# Patient Record
Sex: Female | Born: 1950 | Race: Black or African American | Hispanic: No | Marital: Married | Smoking: Never smoker
Health system: Southern US, Community
[De-identification: ages and names within clinical notes are randomized; demographics above are authoritative.]

## PROBLEM LIST (undated history)

## (undated) DIAGNOSIS — K219 Gastro-esophageal reflux disease without esophagitis: Secondary | ICD-10-CM

## (undated) DIAGNOSIS — E785 Hyperlipidemia, unspecified: Secondary | ICD-10-CM

## (undated) DIAGNOSIS — Z87442 Personal history of urinary calculi: Secondary | ICD-10-CM

## (undated) DIAGNOSIS — C801 Malignant (primary) neoplasm, unspecified: Secondary | ICD-10-CM

## (undated) DIAGNOSIS — R600 Localized edema: Secondary | ICD-10-CM

## (undated) DIAGNOSIS — G629 Polyneuropathy, unspecified: Secondary | ICD-10-CM

## (undated) DIAGNOSIS — I10 Essential (primary) hypertension: Secondary | ICD-10-CM

## (undated) DIAGNOSIS — Z973 Presence of spectacles and contact lenses: Secondary | ICD-10-CM

## (undated) HISTORY — PX: DILATION AND CURETTAGE OF UTERUS: SHX78

## (undated) HISTORY — DX: Hyperlipidemia, unspecified: E78.5

## (undated) HISTORY — PX: PORT A CATH REVISION: SHX6033

## (undated) HISTORY — PX: COLONOSCOPY: SHX174

## (undated) HISTORY — PX: DENTAL SURGERY: SHX609

## (undated) HISTORY — PX: MASTECTOMY: SHX3

## (undated) HISTORY — PX: BACK SURGERY: SHX140

## (undated) HISTORY — DX: Essential (primary) hypertension: I10

---

## 1984-05-04 HISTORY — PX: ABDOMINAL HYSTERECTOMY: SHX81

## 2002-02-07 ENCOUNTER — Encounter: Admission: RE | Admit: 2002-02-07 | Discharge: 2002-02-07 | Payer: Self-pay | Admitting: Unknown Physician Specialty

## 2002-02-07 ENCOUNTER — Encounter: Payer: Self-pay | Admitting: Unknown Physician Specialty

## 2004-06-19 ENCOUNTER — Ambulatory Visit: Payer: Self-pay | Admitting: Internal Medicine

## 2005-07-20 ENCOUNTER — Ambulatory Visit (HOSPITAL_COMMUNITY): Admission: RE | Admit: 2005-07-20 | Discharge: 2005-07-20 | Payer: Self-pay | Admitting: Obstetrics and Gynecology

## 2005-07-23 ENCOUNTER — Ambulatory Visit (HOSPITAL_COMMUNITY): Admission: RE | Admit: 2005-07-23 | Discharge: 2005-07-23 | Payer: Self-pay | Admitting: Surgery

## 2005-07-30 ENCOUNTER — Encounter: Admission: RE | Admit: 2005-07-30 | Discharge: 2005-10-12 | Payer: Self-pay | Admitting: Surgery

## 2005-08-03 ENCOUNTER — Encounter: Admission: RE | Admit: 2005-08-03 | Discharge: 2005-08-03 | Payer: Self-pay | Admitting: Surgery

## 2005-08-14 ENCOUNTER — Ambulatory Visit (HOSPITAL_COMMUNITY): Admission: RE | Admit: 2005-08-14 | Discharge: 2005-08-14 | Payer: Self-pay | Admitting: Surgery

## 2005-10-19 ENCOUNTER — Ambulatory Visit (HOSPITAL_COMMUNITY): Admission: RE | Admit: 2005-10-19 | Discharge: 2005-10-21 | Payer: Self-pay | Admitting: Surgery

## 2005-10-19 HISTORY — PX: LAPAROSCOPIC GASTRIC BANDING: SHX1100

## 2005-10-26 ENCOUNTER — Encounter: Admission: RE | Admit: 2005-10-26 | Discharge: 2006-01-24 | Payer: Self-pay | Admitting: Surgery

## 2007-05-30 ENCOUNTER — Ambulatory Visit: Payer: Self-pay

## 2007-08-05 ENCOUNTER — Ambulatory Visit: Payer: Self-pay | Admitting: Gastroenterology

## 2008-08-09 ENCOUNTER — Ambulatory Visit: Payer: Self-pay | Admitting: Sports Medicine

## 2008-08-23 ENCOUNTER — Encounter: Payer: Self-pay | Admitting: Sports Medicine

## 2008-10-11 ENCOUNTER — Ambulatory Visit: Payer: Self-pay | Admitting: Internal Medicine

## 2009-11-12 ENCOUNTER — Ambulatory Visit: Payer: Self-pay | Admitting: Internal Medicine

## 2010-05-08 ENCOUNTER — Emergency Department: Payer: Self-pay | Admitting: Emergency Medicine

## 2010-12-15 ENCOUNTER — Encounter (INDEPENDENT_AMBULATORY_CARE_PROVIDER_SITE_OTHER): Payer: Self-pay | Admitting: Surgery

## 2010-12-17 ENCOUNTER — Ambulatory Visit (INDEPENDENT_AMBULATORY_CARE_PROVIDER_SITE_OTHER): Payer: PRIVATE HEALTH INSURANCE | Admitting: Surgery

## 2010-12-23 ENCOUNTER — Encounter (INDEPENDENT_AMBULATORY_CARE_PROVIDER_SITE_OTHER): Payer: Self-pay | Admitting: Surgery

## 2010-12-25 ENCOUNTER — Ambulatory Visit (INDEPENDENT_AMBULATORY_CARE_PROVIDER_SITE_OTHER): Payer: PRIVATE HEALTH INSURANCE | Admitting: Surgery

## 2011-01-16 ENCOUNTER — Encounter (INDEPENDENT_AMBULATORY_CARE_PROVIDER_SITE_OTHER): Payer: Self-pay | Admitting: Surgery

## 2011-03-11 ENCOUNTER — Other Ambulatory Visit: Payer: Self-pay

## 2011-04-14 ENCOUNTER — Ambulatory Visit: Payer: Self-pay

## 2012-04-20 ENCOUNTER — Ambulatory Visit: Payer: Self-pay

## 2012-09-28 ENCOUNTER — Ambulatory Visit: Payer: Self-pay

## 2013-02-03 ENCOUNTER — Ambulatory Visit (INDEPENDENT_AMBULATORY_CARE_PROVIDER_SITE_OTHER): Payer: Commercial Managed Care - PPO | Admitting: Surgery

## 2013-02-03 ENCOUNTER — Ambulatory Visit (INDEPENDENT_AMBULATORY_CARE_PROVIDER_SITE_OTHER): Payer: PRIVATE HEALTH INSURANCE | Admitting: Surgery

## 2013-02-03 VITALS — BP 128/78 | HR 77 | Resp 18 | Ht 63.0 in | Wt 186.8 lb

## 2013-02-03 DIAGNOSIS — Z9884 Bariatric surgery status: Secondary | ICD-10-CM

## 2013-02-03 NOTE — Progress Notes (Signed)
Re:   [name] DOB:   1950-07-09 MRN:   [id number]  ASSESSMENT AND PLAN: 1.  Lap band - 10 cm - 10/19/2005 - D. Heydy Montilla  Initial weight - 222, BMI 39.45  She has actually done very well with her weight loss and I think the lap band is functioning well.  I think her left upper quadrant pain is more consistent with costochondritis.  She'll see me back on her lap band anniversary, June 2015, to follow up the lap band and her weight loss.  2.  Hypertension 3.  Hyperlipidemia. 4.  Costochondritis, right costal margin  I discussed trying NSAID's for treatment.  And that is may be slow to resolve.  Chief Complaint  Patient presents with  . Lap Band Fill    Removal?   REFERRING PHYSICIAN: Lyndon Code, MD  HISTORY OF PRESENT ILLNESS: Regina Deleon is a 62 y.o. (DOB: 1951/02/26)  AA  female whose primary care physician is Lyndon Code, MD and comes to me today for lap band.  I last saw Ms. Kilbride 08/08/2008.  She has had some discomfort at her right costal margin that she attributes to the lap band and is interested in getting the lap band removed.  He is also concerned about the lap band decaying. She does get sick and vomits when she eats too fast or too much. So the Lap Band continues to provide resistance.  She has had the right subcostal pain for several months.  It hurts her when she gets up.  She has had no fever and no history of trauma. We talked about the lap band and its function.  I actually think that she would gain weight if the lap band were removed.   Past Medical History  Diagnosis Date  . Hypertension   . Hyperlipidemia     Current Outpatient Prescriptions  Medication Sig Dispense Refill  . aspirin 81 MG tablet Take 81 mg by mouth daily.      Marland Kitchen NIFEdipine (PROCARDIA-XL/ADALAT-CC/NIFEDICAL-XL) 30 MG 24 hr tablet Take 30 mg by mouth daily.      Marland Kitchen triamterene-hydrochlorothiazide (MAXZIDE-25) 37.5-25 MG per tablet Take 1 tablet by mouth daily.      . vitamin E 100  UNIT capsule Take 100 Units by mouth daily.      Marland Kitchen zolpidem (AMBIEN) 5 MG tablet Take 5 mg by mouth at bedtime as needed for sleep.       No current facility-administered medications for this visit.    Allergies  Allergen Reactions  . Darvocet [Propoxyphene-Acetaminophen]   . Enalapril   . Latex   . Penicillins     REVIEW OF SYSTEMS: Cardiac:  Hypertension.  Endocrine:  No diabetes. No thyroid disease. Hypercholesterolemia. Gastrointestinal:  Has had recent colonoscopy. Musculoskeletal:  History of back surgery.  SOCIAL and FAMILY HISTORY:  PHYSICAL EXAM: BP 128/78  Pulse 77  Resp 18  Ht 5\' 3"  (1.6 m)  Wt 186 lb 12.8 oz (84.732 kg)  BMI 33.1 kg/m2  General: WN AA F who is alert and generally healthy appearing.  HEENT: Normal. Pupils equal. Lungs: Clear to auscultation and symmetric breath sounds. Heart:  RRR. No murmur or rub. Abdomen: Soft. Tenderness at right costal margin.  This is a point tenderness.  The port is inferior and medial to her pain and is not tender.  I showed her a lap band model to show her the anatomy of the lap band.  I think the lap band  is working well for her.  She has normal bowel sounds and no peritoneal signs. Extremities:  Good strength and ROM  in upper and lower extremities. Neurologic:  Grossly intact to motor and sensory function. Psychiatric: Behavior is normal.   DATA REVIEWED: Epic  Ovidio Kin, MD,  [facility] [address]  [address]   [place], [place]    [address] Phone:  [phone] FAX:  [phone]

## 2013-10-16 ENCOUNTER — Ambulatory Visit: Payer: Self-pay

## 2013-12-12 ENCOUNTER — Other Ambulatory Visit: Payer: Self-pay

## 2013-12-12 ENCOUNTER — Other Ambulatory Visit: Payer: Self-pay | Admitting: Internal Medicine

## 2013-12-12 DIAGNOSIS — Z1231 Encounter for screening mammogram for malignant neoplasm of breast: Secondary | ICD-10-CM

## 2013-12-27 ENCOUNTER — Ambulatory Visit
Admission: RE | Admit: 2013-12-27 | Discharge: 2013-12-27 | Disposition: A | Discharge status: Home / Self Care | Payer: BC Managed Care – PPO | Source: Ambulatory Visit | Attending: Internal Medicine | Admitting: Internal Medicine

## 2013-12-27 DIAGNOSIS — Z1231 Encounter for screening mammogram for malignant neoplasm of breast: Secondary | ICD-10-CM

## 2013-12-29 ENCOUNTER — Other Ambulatory Visit: Payer: Self-pay | Admitting: Internal Medicine

## 2013-12-29 DIAGNOSIS — R928 Other abnormal and inconclusive findings on diagnostic imaging of breast: Secondary | ICD-10-CM

## 2014-01-09 ENCOUNTER — Other Ambulatory Visit: Payer: Self-pay | Admitting: Internal Medicine

## 2014-01-09 DIAGNOSIS — R928 Other abnormal and inconclusive findings on diagnostic imaging of breast: Secondary | ICD-10-CM

## 2014-01-10 ENCOUNTER — Ambulatory Visit
Admission: RE | Admit: 2014-01-10 | Discharge: 2014-01-10 | Disposition: A | Discharge status: Home / Self Care | Payer: BC Managed Care – PPO | Source: Ambulatory Visit | Attending: Internal Medicine | Admitting: Internal Medicine

## 2014-01-10 ENCOUNTER — Other Ambulatory Visit: Payer: Self-pay | Admitting: Internal Medicine

## 2014-01-10 DIAGNOSIS — R928 Other abnormal and inconclusive findings on diagnostic imaging of breast: Secondary | ICD-10-CM

## 2014-01-10 DIAGNOSIS — N63 Unspecified lump in unspecified breast: Secondary | ICD-10-CM

## 2014-01-12 ENCOUNTER — Other Ambulatory Visit (INDEPENDENT_AMBULATORY_CARE_PROVIDER_SITE_OTHER): Payer: Self-pay

## 2014-01-12 ENCOUNTER — Other Ambulatory Visit: Payer: Self-pay | Admitting: Internal Medicine

## 2014-01-12 DIAGNOSIS — D0592 Unspecified type of carcinoma in situ of left breast: Secondary | ICD-10-CM

## 2014-01-12 DIAGNOSIS — C50912 Malignant neoplasm of unspecified site of left female breast: Secondary | ICD-10-CM

## 2014-01-19 ENCOUNTER — Encounter: Payer: Self-pay | Admitting: Hematology and Oncology

## 2014-01-19 ENCOUNTER — Ambulatory Visit (HOSPITAL_BASED_OUTPATIENT_CLINIC_OR_DEPARTMENT_OTHER): Payer: BC Managed Care – PPO | Admitting: Hematology and Oncology

## 2014-01-19 ENCOUNTER — Ambulatory Visit (HOSPITAL_BASED_OUTPATIENT_CLINIC_OR_DEPARTMENT_OTHER): Payer: BC Managed Care – PPO

## 2014-01-19 ENCOUNTER — Ambulatory Visit
Admission: RE | Admit: 2014-01-19 | Discharge: 2014-01-19 | Disposition: A | Discharge status: Home / Self Care | Payer: BC Managed Care – PPO | Source: Ambulatory Visit | Attending: Internal Medicine | Admitting: Internal Medicine

## 2014-01-19 VITALS — BP 129/79 | HR 97 | Temp 97.0°F | Resp 18 | Ht 63.0 in | Wt 204.2 lb

## 2014-01-19 DIAGNOSIS — E119 Type 2 diabetes mellitus without complications: Secondary | ICD-10-CM

## 2014-01-19 DIAGNOSIS — C50212 Malignant neoplasm of upper-inner quadrant of left female breast: Secondary | ICD-10-CM | POA: Insufficient documentation

## 2014-01-19 DIAGNOSIS — C50912 Malignant neoplasm of unspecified site of left female breast: Secondary | ICD-10-CM

## 2014-01-19 DIAGNOSIS — C773 Secondary and unspecified malignant neoplasm of axilla and upper limb lymph nodes: Secondary | ICD-10-CM

## 2014-01-19 DIAGNOSIS — I10 Essential (primary) hypertension: Secondary | ICD-10-CM

## 2014-01-19 DIAGNOSIS — C50919 Malignant neoplasm of unspecified site of unspecified female breast: Secondary | ICD-10-CM

## 2014-01-19 HISTORY — DX: Malignant neoplasm of upper-inner quadrant of left female breast: C50.212

## 2014-01-19 MED ORDER — GADOBENATE DIMEGLUMINE 529 MG/ML IV SOLN
19.0000 mL | Freq: Once | INTRAVENOUS | Status: AC | PRN
  Administered 2014-01-19: 19 mL via INTRAVENOUS

## 2014-01-19 NOTE — Progress Notes (Signed)
[facility] NOTE  Patient Care Team: Lavera Guise, MD as PCP - General (Internal Medicine)  CHIEF COMPLAINTS/PURPOSE OF CONSULTATION:  Newly diagnosed breast cancer  HISTORY OF PRESENTING ILLNESS:  Regina Deleon 63 y.o. African American female is here because of recent diagnosis of left breast cancer. Patient underwent a mammogram on 08/07/2013 followed by ultrasound that revealed 1.8 cm mass in the left breast at 3:00 position in addition to that there were actually lymph nodes one was 2.8 cm and 1 was 1.2 cm in size. She underwent biopsy of the mass on the lymph node by ultrasound guidance. 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 Biopsied x-ray lymph nodes also showed invasive ductal carcinoma that was ER positive PR negative HER-2 positive. She had MRI of the breasts today and is going to be presented in the tumor board next Friday to come up with a treatment plan. She is here by herself and left her husband outside by choice. She denies any other symptoms or concerns. She also has a lap band placed in her surgically. She also had a hysterectomy and back surgeries. She has some numbness in the left leg I reviewed her records extensively and collaborated the history with the patient.  SUMMARY OF ONCOLOGIC HISTORY: Oncology History   And     Breast cancer of upper-inner quadrant of left female breast   12/27/2013 Mammogram Mammogram and ultrasound 1.8 cm mass left breast 3:00; 7 cm from nipple with abnormal 1 left axillary lymph node, 2.8 x 1.8 x 1.7 cm, second adjacent smaller nodule 1.2 x 1 x 0.7 cm   01/10/2014 Initial Diagnosis 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        1 AxLN biopsy: ER 70% PR 0% Ki-67 80% HER-2 +2.92, gene copy #5.5    In terms of breast cancer risk profile:  She menarched at early age of 19 and went to menopause at age 31  She had 2 pregnancy, her  first child was born at age 63  She has not received birth control pills.  She was exposed to fertility medications or hormone replacement therapy. She took Premarin for several years She has family history of Breast/GYN/GI cancer  MEDICAL HISTORY:  Past Medical History  Diagnosis Date  . Hypertension   . Hyperlipidemia   . Diabetes mellitus without complication     SURGICAL HISTORY: Past Surgical History  Procedure Laterality Date  . Laparoscopic gastric banding  10/19/05  . Abdominal hysterectomy  1986  . Back surgery  1990, 1991    SOCIAL HISTORY: History   Social History  . Marital Status: Married    Spouse Name: N/A    Number of Children: N/A  . Years of Education: N/A   Occupational History  . Not on file.   Social History Main Topics  . Smoking status: Never Smoker   . Smokeless tobacco: Never Used  . Alcohol Use: No  . Drug Use: No  . Sexual Activity: Yes   Other Topics Concern  . Not on file   Social History Narrative  . No narrative on file    FAMILY HISTORY: Family History  Problem Relation Age of Onset  . Cancer Maternal Aunt   . Cancer Cousin   . Cancer Cousin   . Cancer Cousin     ALLERGIES:  is allergic to darvocet; latex; enalapril; and  penicillins.  MEDICATIONS:  Current Outpatient Prescriptions  Medication Sig Dispense Refill  . aspirin 81 MG tablet Take 81 mg by mouth daily.      Marland Kitchen NIFEdipine (PROCARDIA-XL/ADALAT-CC/NIFEDICAL-XL) 30 MG 24 hr tablet Take 30 mg by mouth daily.      Marland Kitchen triamterene-hydrochlorothiazide (MAXZIDE-25) 37.5-25 MG per tablet Take 1 tablet by mouth daily.      Marland Kitchen zolpidem (AMBIEN) 5 MG tablet Take 5 mg by mouth at bedtime as needed for sleep.       No current facility-administered medications for this visit.    REVIEW OF SYSTEMS:   Constitutional: Denies fevers, chills or abnormal night sweats Eyes: Denies blurriness of vision, double vision or watery eyes Ears, nose, mouth, throat, and face: Denies  mucositis or sore throat Respiratory: Denies cough, dyspnea or wheezes Cardiovascular: Denies palpitation, chest discomfort or lower extremity swelling Gastrointestinal:  Denies nausea, heartburn or change in bowel habits Skin: Denies abnormal skin rashes Lymphatics: Denies new lymphadenopathy or easy bruising Neurological:Denies numbness, tingling or new weaknesses Behavioral/Psych: Mood is stable, no new changes  Breast: Denies any palpable lumps or discharge All other systems were reviewed with the patient and are negative.  PHYSICAL EXAMINATION: ECOG PERFORMANCE STATUS: 0 - Asymptomatic  Filed Vitals:   01/19/14 1352  BP: 129/79  Pulse: 97  Temp: 97 F (36.1 C)  Resp: 18   Filed Weights   01/19/14 1352  Weight: 204 lb 4 oz (92.647 kg)    GENERAL:alert, no distress and comfortable SKIN: skin color, texture, turgor are normal, no rashes or significant lesions EYES: normal, conjunctiva are pink and non-injected, sclera clear OROPHARYNX:no exudate, no erythema and lips, buccal mucosa, and tongue normal  NECK: supple, thyroid normal size, non-tender, without nodularity LYMPH:  no palpable lymphadenopathy in the cervical, axillary or inguinal LUNGS: clear to auscultation and percussion with normal breathing effort HEART: regular rate & rhythm and no murmurs and no lower extremity edema ABDOMEN:abdomen soft, non-tender and normal bowel sounds Musculoskeletal:no cyanosis of digits and no clubbing  PSYCH: alert & oriented x 3 with fluent speech NEURO: no focal motor/sensory deficits BREAST:No palpable nodules in breast. No palpable axillary or supraclavicular lymphadenopathy  RADIOGRAPHIC STUDIES: I have personally reviewed the radiological reports and agreed with the findings in the report. Mammogram ultrasounds or reports as above ASSESSMENT AND PLAN:  Breast cancer of upper-inner quadrant of left female breast 1. 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 Biopsy of the breast mass revealed IDC with DCIS ER/PR positive HER-2 positive and biopsied X3 lymph node showed ER positive PR negative HER-2 positive both had a Ki-67 of 80%, clinical stage IIA T1 CN1 M0  2. Discussed with the patient, the details of pathology including the type of breast cancer,the clinical staging, the significance of ER, PR and HER-2/neu receptors and the implications for treatment. After reviewing the pathology in detail, we proceeded to discuss the different treatment options between surgery, radiation, chemotherapy, antiestrogen therapies.  3. I discussed with her that she would benefit from systemic chemotherapy either given in a neoadjuvant fashion or in the adjuvant setting. Given the fact that she has node positive disease, I would recommend neoadjuvant therapy. We will present her case on this Wednesday his tumor board and make a final determination on the best approach to her treatment. Patient is willing to do whatever it takes to get the best possible outcomes.  4. I briefly discussed with her  the indications for radiation therapy. She understands that after chemotherapy she would need to go on Herceptin maintenance for a year. And after radiation therapy she would need antiestrogen therapy for 5-10 years.  5. If she were to need chemotherapy she will need port placement, echocardiogram, chemotherapy patient. I discussed the risks and benefits of chemotherapy including the risks of nausea/ vomiting, risk of infection from low WBC count, fatigue due to chemo or anemia, bruising or bleeding due to low platelets, mouth sores, loss/ change in taste and decreased appetite. Liver and kidney function will be monitored through out chemotherapy as abnormalities in liver and kidney function may be a side effect of treatment.  Cardiac dysfunction due to Adriamycin Herceptin and Perjeta were discussed in detail. Risk of permanent  bone marrow dysfunction and leukemia due to chemo were also discussed.  We will call the patient back on Wednesday after discussing in the tumor board regarding the final treatment plan.      All questions were answered. The patient knows to call the clinic with any problems, questions or concerns. I spent 55 minutes counseling the patient face to face. The total time spent in the appointment was 60 minutes and more than 50% was on counseling.     Rulon Eisenmenger, MD 01/19/2014 3:53 PM

## 2014-01-19 NOTE — Progress Notes (Signed)
Checked in new patient with no financial issues prior to seeing the dr. She has appt card and [facility] packet. She has my card and alight flyer and will call if asst is needed.

## 2014-01-19 NOTE — Assessment & Plan Note (Signed)
1. 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 Biopsy of the breast mass revealed IDC with DCIS ER/PR positive HER-2 positive and biopsied X3 lymph node showed ER positive PR negative HER-2 positive both had a Ki-67 of 80%, clinical stage IIA T1 CN1 M0  2. Discussed with the patient, the details of pathology including the type of breast cancer,the clinical staging, the significance of ER, PR and HER-2/neu receptors and the implications for treatment. After reviewing the pathology in detail, we proceeded to discuss the different treatment options between surgery, radiation, chemotherapy, antiestrogen therapies.  3. I discussed with her that she would benefit from systemic chemotherapy either given in a neoadjuvant fashion or in the adjuvant setting. Given the fact that she has node positive disease, I would recommend neoadjuvant therapy. We will present her case on this Wednesday his tumor board and make a final determination on the best approach to her treatment. Patient is willing to do whatever it takes to get the best possible outcomes.  4. I briefly discussed with her the indications for radiation therapy. She understands that after chemotherapy she would need to go on Herceptin maintenance for a year. And after radiation therapy she would need antiestrogen therapy for 5-10 years.  5. If she were to need chemotherapy she will need port placement, echocardiogram, chemotherapy patient. I discussed the risks and benefits of chemotherapy including the risks of nausea/ vomiting, risk of infection from low WBC count, fatigue due to chemo or anemia, bruising or bleeding due to low platelets, mouth sores, loss/ change in taste and decreased appetite. Liver and kidney function will be monitored through out chemotherapy as abnormalities in liver and kidney function may be a side effect of treatment.  Cardiac dysfunction due to Adriamycin  Herceptin and Perjeta were discussed in detail. Risk of permanent bone marrow dysfunction and leukemia due to chemo were also discussed.  We will call the patient back on Wednesday after discussing in the tumor board regarding the final treatment plan.

## 2014-01-22 ENCOUNTER — Other Ambulatory Visit (INDEPENDENT_AMBULATORY_CARE_PROVIDER_SITE_OTHER): Payer: Self-pay | Admitting: Surgery

## 2014-01-22 DIAGNOSIS — R928 Other abnormal and inconclusive findings on diagnostic imaging of breast: Secondary | ICD-10-CM

## 2014-01-23 NOTE — Progress Notes (Signed)
Note created by Dr. Gudena during office visit. Copy to patient, original to scan. 

## 2014-01-24 ENCOUNTER — Telehealth: Payer: Self-pay | Admitting: Hematology and Oncology

## 2014-01-24 ENCOUNTER — Encounter: Payer: Self-pay | Admitting: Hematology and Oncology

## 2014-01-24 ENCOUNTER — Other Ambulatory Visit (INDEPENDENT_AMBULATORY_CARE_PROVIDER_SITE_OTHER): Payer: Self-pay | Admitting: Surgery

## 2014-01-24 ENCOUNTER — Other Ambulatory Visit: Payer: Self-pay | Admitting: *Deleted

## 2014-01-24 DIAGNOSIS — C50212 Malignant neoplasm of upper-inner quadrant of left female breast: Secondary | ICD-10-CM

## 2014-01-24 NOTE — Progress Notes (Signed)
Spoke to pt concerning treatment care plan after discussion from breast tumor board. Informed pt about need for neoadjuvant chemotherapy and port placement. Discussed Dr. Pollie Friar office will call and schedule her for port. Scheduled and confirmed f/u appt with Dr. Lindi Adie on 01/31/14. Confirmed Dr. Pearlie Oyster appt as well. Pt very appreciative and willingness to comply with any recommendations. Informed research of possibility of study candidate.

## 2014-01-24 NOTE — Telephone Encounter (Signed)
, °

## 2014-01-24 NOTE — Progress Notes (Signed)
Called patient back and she will drop off her bank stmt for her and hubby to see if can get 1000.00 [employer] account executive. She is not sure if deductible has been met and she thnks she has 80/20 split. I advised can help with bills other than [facility], but she can't apply for asst with [facility] until she gets bal over 5000.00.

## 2014-01-25 ENCOUNTER — Ambulatory Visit
Admission: RE | Admit: 2014-01-25 | Discharge: 2014-01-25 | Disposition: A | Discharge status: Home / Self Care | Payer: BC Managed Care – PPO | Source: Ambulatory Visit | Attending: Surgery | Admitting: Surgery

## 2014-01-25 ENCOUNTER — Telehealth: Payer: Self-pay | Admitting: Hematology and Oncology

## 2014-01-25 DIAGNOSIS — R928 Other abnormal and inconclusive findings on diagnostic imaging of breast: Secondary | ICD-10-CM

## 2014-01-25 MED ORDER — GADOBENATE DIMEGLUMINE 529 MG/ML IV SOLN
19.0000 mL | Freq: Once | INTRAVENOUS | Status: AC | PRN
  Administered 2014-01-25: 19 mL via INTRAVENOUS

## 2014-01-25 NOTE — Telephone Encounter (Signed)
, °

## 2014-01-29 ENCOUNTER — Ambulatory Visit (HOSPITAL_COMMUNITY): Admission: RE | Admit: 2014-01-29 | Payer: BC Managed Care – PPO | Source: Ambulatory Visit

## 2014-01-29 ENCOUNTER — Encounter: Payer: Self-pay | Admitting: Hematology and Oncology

## 2014-01-29 ENCOUNTER — Other Ambulatory Visit: Payer: BC Managed Care – PPO

## 2014-01-29 ENCOUNTER — Other Ambulatory Visit: Payer: Self-pay

## 2014-01-29 ENCOUNTER — Encounter (HOSPITAL_COMMUNITY): Payer: Self-pay | Admitting: Pharmacy Technician

## 2014-01-29 DIAGNOSIS — C50212 Malignant neoplasm of upper-inner quadrant of left female breast: Secondary | ICD-10-CM

## 2014-01-29 NOTE — Progress Notes (Signed)
Pt notified d/t, NPO post 7, driver needed

## 2014-01-29 NOTE — Progress Notes (Signed)
No treatment date as of today.

## 2014-01-29 NOTE — Progress Notes (Signed)
Called and left patient mess to call me back. She left bank stmt and some bills. Will have to explain can pay if/when she starts treatment and will need entire bill.

## 2014-01-29 NOTE — Progress Notes (Signed)
Pt LM re: treatment start date.  Chart review -port not scheduled.  Scheduled pt port for Wed 9/30 at 130.  Called pt with d/t.  Pt reports she was not notified of d/t for echo.  Echo was scheduled by another RN.  Let pt know I would take care of.  Scheduled echo for Wed 9/30 at 1100.  Called pt back and let her know d/t, NPO after 7am, and driver needed.  Confirmed with patient Wednesday appts: 930 and 10 - radiation, 11 - echo, 1215 - Dr Lindi Adie, 130 port placement.  Apologized to patient that she was not notified of appt for echo.  Pt voiced understanding.

## 2014-01-30 ENCOUNTER — Other Ambulatory Visit: Payer: Self-pay | Admitting: Radiology

## 2014-01-30 ENCOUNTER — Telehealth: Payer: Self-pay | Admitting: Hematology and Oncology

## 2014-01-30 ENCOUNTER — Encounter: Payer: Self-pay | Admitting: *Deleted

## 2014-01-30 ENCOUNTER — Other Ambulatory Visit: Payer: BC Managed Care – PPO

## 2014-01-30 ENCOUNTER — Telehealth: Payer: Self-pay | Admitting: *Deleted

## 2014-01-30 NOTE — Telephone Encounter (Signed)
s.w. pt and advised on all appts....pt will pickup new sched tomorrow

## 2014-01-30 NOTE — Telephone Encounter (Signed)
Per staff message and POF I have scheduled appts. Advised scheduler of appts. JMW  

## 2014-01-30 NOTE — Progress Notes (Signed)
Location of Breast Cancer: Left Breast: Upper Inner  Histology per Pathology Report:   01/25/14 Diagnosis 1. Breast, left, needle core biopsy, posterior UOQ - INVASIVE DUCTAL CARCINOMA, SEE COMMENT. 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 2. Breast, left, needle core biopsy, anterior LOQ - INVASIVE DUCTAL CARCINOMA, SEE COMMENT  9/10/15Diagnosis 1. Breast, left, needle core biopsy, left axilla, 3:00 - INVASIVE DUCTAL CARCINOMA. - DUCTAL CARCINOMA IN SITU. - SEE COMMENT. 2. Lymph node, needle/core biopsy, left axillary 1 node - ONE LYMPH NODE POSITIVE FOR METASTATIC DUCTAL CARCINOMA   Receptor Status: ER(25%), PR(4%), Her2-neu(2.16 -UOQ), Ki-67(80%)                             ER(17%),  PR(0%), Her2-neu(1.35- LOQ), Ki-67(80%)  Regina Deleon underwent a mammogram on 08/07/2013 followed by ultrasound that revealed 1.8 cm mass in the left breast at 3:00 position in addition to that there were actually lymph nodes one was 2.8 cm and 1 was 1.2 cm in size. She underwent biopsy of the mass on the lymph node by ultrasound guidance. 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 Biopsied x-ray lymph nodes also showed invasive ductal carcinoma that was ER positive PR negative HER-2 positive.   Past/Anticipated interventions by surgeon, if any: Dr. Joneen Caraway : Biospy of left breast performed at the [facility].  Dr. Alphonsa Overall -Referral- 03/15/14 visit   Past/Anticipated interventions by medical oncology, if any: Chemotherapy 01/19/14 - Dr. Nicholas Lose - Chemotherapy  Lymphedema issues, if any:  None  Pain issues, if any: None  SAFETY ISSUES:  Prior radiation? NO  Pacemaker/ICD? NO  Possible current pregnancy? NO  Is the patient on methotrexate? NO  Current Complaints / other details:

## 2014-01-31 ENCOUNTER — Ambulatory Visit (HOSPITAL_BASED_OUTPATIENT_CLINIC_OR_DEPARTMENT_OTHER): Payer: BC Managed Care – PPO | Admitting: Hematology and Oncology

## 2014-01-31 ENCOUNTER — Ambulatory Visit (HOSPITAL_COMMUNITY)
Admission: RE | Admit: 2014-01-31 | Discharge: 2014-01-31 | Disposition: A | Discharge status: Home / Self Care | Payer: BC Managed Care – PPO | Source: Ambulatory Visit | Attending: Hematology and Oncology | Admitting: Hematology and Oncology

## 2014-01-31 ENCOUNTER — Ambulatory Visit
Admission: RE | Admit: 2014-01-31 | Discharge: 2014-01-31 | Disposition: A | Discharge status: Home / Self Care | Payer: BC Managed Care – PPO | Source: Ambulatory Visit | Attending: Radiation Oncology | Admitting: Radiation Oncology

## 2014-01-31 ENCOUNTER — Other Ambulatory Visit: Payer: Self-pay | Admitting: Radiology

## 2014-01-31 ENCOUNTER — Encounter (HOSPITAL_COMMUNITY): Payer: Self-pay

## 2014-01-31 ENCOUNTER — Encounter: Payer: Self-pay | Admitting: Radiation Oncology

## 2014-01-31 ENCOUNTER — Encounter: Payer: Self-pay | Admitting: Hematology and Oncology

## 2014-01-31 ENCOUNTER — Other Ambulatory Visit: Payer: Self-pay | Admitting: Hematology and Oncology

## 2014-01-31 VITALS — BP 117/62 | HR 73 | Temp 98.0°F | Ht 63.0 in | Wt 203.9 lb

## 2014-01-31 VITALS — BP 148/79 | HR 84 | Temp 98.3°F | Resp 18 | Ht 63.0 in | Wt 202.7 lb

## 2014-01-31 DIAGNOSIS — Z9884 Bariatric surgery status: Secondary | ICD-10-CM | POA: Diagnosis not present

## 2014-01-31 DIAGNOSIS — C50419 Malignant neoplasm of upper-outer quadrant of unspecified female breast: Secondary | ICD-10-CM | POA: Insufficient documentation

## 2014-01-31 DIAGNOSIS — C50212 Malignant neoplasm of upper-inner quadrant of left female breast: Secondary | ICD-10-CM

## 2014-01-31 DIAGNOSIS — C50919 Malignant neoplasm of unspecified site of unspecified female breast: Secondary | ICD-10-CM

## 2014-01-31 DIAGNOSIS — Z7982 Long term (current) use of aspirin: Secondary | ICD-10-CM | POA: Insufficient documentation

## 2014-01-31 DIAGNOSIS — E785 Hyperlipidemia, unspecified: Secondary | ICD-10-CM | POA: Diagnosis not present

## 2014-01-31 DIAGNOSIS — C50412 Malignant neoplasm of upper-outer quadrant of left female breast: Secondary | ICD-10-CM

## 2014-01-31 DIAGNOSIS — C969 Malignant neoplasm of lymphoid, hematopoietic and related tissue, unspecified: Secondary | ICD-10-CM | POA: Diagnosis not present

## 2014-01-31 DIAGNOSIS — Z79899 Other long term (current) drug therapy: Secondary | ICD-10-CM | POA: Insufficient documentation

## 2014-01-31 DIAGNOSIS — C50219 Malignant neoplasm of upper-inner quadrant of unspecified female breast: Secondary | ICD-10-CM | POA: Diagnosis present

## 2014-01-31 DIAGNOSIS — Z01818 Encounter for other preprocedural examination: Secondary | ICD-10-CM | POA: Insufficient documentation

## 2014-01-31 DIAGNOSIS — C773 Secondary and unspecified malignant neoplasm of axilla and upper limb lymph nodes: Secondary | ICD-10-CM

## 2014-01-31 DIAGNOSIS — Z510 Encounter for antineoplastic radiation therapy: Secondary | ICD-10-CM | POA: Insufficient documentation

## 2014-01-31 DIAGNOSIS — Z90710 Acquired absence of both cervix and uterus: Secondary | ICD-10-CM | POA: Diagnosis not present

## 2014-01-31 DIAGNOSIS — Z452 Encounter for adjustment and management of vascular access device: Secondary | ICD-10-CM | POA: Diagnosis present

## 2014-01-31 DIAGNOSIS — E119 Type 2 diabetes mellitus without complications: Secondary | ICD-10-CM | POA: Insufficient documentation

## 2014-01-31 DIAGNOSIS — I10 Essential (primary) hypertension: Secondary | ICD-10-CM | POA: Insufficient documentation

## 2014-01-31 DIAGNOSIS — Z170 Estrogen receptor positive status [ER+]: Secondary | ICD-10-CM

## 2014-01-31 LAB — GLUCOSE, CAPILLARY: GLUCOSE-CAPILLARY: 93 mg/dL (ref 70–99)

## 2014-01-31 LAB — CBC WITH DIFFERENTIAL/PLATELET
BASOS ABS: 0.1 10*3/uL (ref 0.0–0.1)
Basophils Relative: 1 % (ref 0–1)
Eosinophils Absolute: 0.1 10*3/uL (ref 0.0–0.7)
Eosinophils Relative: 1 % (ref 0–5)
HCT: 39 % (ref 36.0–46.0)
HEMOGLOBIN: 12.4 g/dL (ref 12.0–15.0)
Lymphocytes Relative: 31 % (ref 12–46)
Lymphs Abs: 2.5 10*3/uL (ref 0.7–4.0)
MCH: 27.6 pg (ref 26.0–34.0)
MCHC: 31.8 g/dL (ref 30.0–36.0)
MCV: 86.9 fL (ref 78.0–100.0)
MONOS PCT: 5 % (ref 3–12)
Monocytes Absolute: 0.4 10*3/uL (ref 0.1–1.0)
NEUTROS ABS: 5.2 10*3/uL (ref 1.7–7.7)
NEUTROS PCT: 62 % (ref 43–77)
Platelets: 301 10*3/uL (ref 150–400)
RBC: 4.49 MIL/uL (ref 3.87–5.11)
RDW: 13.7 % (ref 11.5–15.5)
WBC: 8.3 10*3/uL (ref 4.0–10.5)

## 2014-01-31 LAB — PROTIME-INR
INR: 1.03 (ref 0.00–1.49)
Prothrombin Time: 13.5 seconds (ref 11.6–15.2)

## 2014-01-31 LAB — APTT: APTT: 25 s (ref 24–37)

## 2014-01-31 MED ORDER — MIDAZOLAM HCL 2 MG/2ML IJ SOLN
INTRAMUSCULAR | Status: AC
  Filled 2014-01-31: qty 6

## 2014-01-31 MED ORDER — LORAZEPAM 0.5 MG PO TABS: 0.5000 mg | ORAL_TABLET | Freq: Four times a day (QID) | ORAL | Status: DC | PRN

## 2014-01-31 MED ORDER — SODIUM CHLORIDE 0.9 % IV SOLN
INTRAVENOUS | Status: DC
  Administered 2014-01-31: 15:00:00 via INTRAVENOUS

## 2014-01-31 MED ORDER — HEPARIN SOD (PORK) LOCK FLUSH 100 UNIT/ML IV SOLN
INTRAVENOUS | Status: AC
  Filled 2014-01-31: qty 5

## 2014-01-31 MED ORDER — VANCOMYCIN HCL IN DEXTROSE 1-5 GM/200ML-% IV SOLN
1000.0000 mg | INTRAVENOUS | Status: AC
  Administered 2014-01-31: 1000 mg via INTRAVENOUS
  Filled 2014-01-31: qty 200

## 2014-01-31 MED ORDER — FENTANYL CITRATE 0.05 MG/ML IJ SOLN
INTRAMUSCULAR | Status: AC
  Filled 2014-01-31: qty 6

## 2014-01-31 MED ORDER — LIDOCAINE-PRILOCAINE 2.5-2.5 % EX CREA: 1.0000 "application " | TOPICAL_CREAM | CUTANEOUS | Status: DC | PRN

## 2014-01-31 MED ORDER — LIDOCAINE-EPINEPHRINE (PF) 2 %-1:200000 IJ SOLN
INTRAMUSCULAR | Status: AC
  Filled 2014-01-31: qty 20

## 2014-01-31 MED ORDER — DEXAMETHASONE 4 MG PO TABS: 8.0000 mg | ORAL_TABLET | Freq: Two times a day (BID) | ORAL | Status: DC

## 2014-01-31 MED ORDER — ONDANSETRON HCL 8 MG PO TABS: 8.0000 mg | ORAL_TABLET | Freq: Two times a day (BID) | ORAL | Status: DC

## 2014-01-31 MED ORDER — FENTANYL CITRATE 0.05 MG/ML IJ SOLN
INTRAMUSCULAR | Status: AC | PRN
  Administered 2014-01-31: 25 ug via INTRAVENOUS
  Administered 2014-01-31: 50 ug via INTRAVENOUS
  Administered 2014-01-31: 25 ug via INTRAVENOUS

## 2014-01-31 MED ORDER — PROCHLORPERAZINE MALEATE 10 MG PO TABS
10.0000 mg | ORAL_TABLET | Freq: Four times a day (QID) | ORAL | Status: DC | PRN
Start: 2014-01-31 — End: 2014-07-04

## 2014-01-31 MED ORDER — MIDAZOLAM HCL 2 MG/2ML IJ SOLN
INTRAMUSCULAR | Status: AC | PRN
  Administered 2014-01-31 (×3): 1 mg via INTRAVENOUS
  Administered 2014-01-31 (×2): 0.5 mg via INTRAVENOUS

## 2014-01-31 NOTE — Discharge Instructions (Signed)
Conscious Sedation, Adult, Care After Refer to this sheet in the next few weeks. These instructions provide you with information on caring for yourself after your procedure. Your health care provider may also give you more specific instructions. Your treatment has been planned according to current medical practices, but problems sometimes occur. Call your health care provider if you have any problems or questions after your procedure. WHAT TO EXPECT AFTER THE PROCEDURE  After your procedure:  You may feel sleepy, clumsy, and have poor balance for several hours.  Vomiting may occur if you eat too soon after the procedure. HOME CARE INSTRUCTIONS  Do not participate in any activities where you could become injured for at least 24 hours. Do not:  Drive.  Swim.  Ride a bicycle.  Operate heavy machinery.  Cook.  Use power tools.  Climb ladders.  Work from a high place.  Do not make important decisions or sign legal documents until you are improved.  If you vomit, drink water, juice, or soup when you can drink without vomiting. Make sure you have little or no nausea before eating solid foods.  Only take over-the-counter or prescription medicines for pain, discomfort, or fever as directed by your health care provider.  Make sure you and your family fully understand everything about the medicines given to you, including what side effects may occur.  You should not drink alcohol, take sleeping pills, or take medicines that cause drowsiness for at least 24 hours.  If you smoke, do not smoke without supervision.  If you are feeling better, you may resume normal activities 24 hours after you were sedated.  Keep all appointments with your health care provider. SEEK MEDICAL CARE IF:  Your skin is pale or bluish in color.  You continue to feel nauseous or vomit.  Your pain is getting worse and is not helped by medicine.  You have bleeding or swelling.  You are still sleepy or  feeling clumsy after 24 hours. SEEK IMMEDIATE MEDICAL CARE IF:  You develop a rash.  You have difficulty breathing.  You develop any type of allergic problem.  You have a fever. MAKE SURE YOU:  Understand these instructions.  Will watch your condition.  Will get help right away if you are not doing well or get worse. Document Released: 02/08/2013 Document Reviewed: 02/08/2013 [facility] Patient Information 2015 [place], [place]. This information is not intended to replace advice given to you by your health care provider. Make sure you discuss any questions you have with your health care provider.  Implanted Port Insertion, Care After Refer to this sheet in the next few weeks. These instructions provide you with information on caring for yourself after your procedure. Your health care provider may also give you more specific instructions. Your treatment has been planned according to current medical practices, but problems sometimes occur. Call your health care provider if you have any problems or questions after your procedure. WHAT TO EXPECT AFTER THE PROCEDURE After your procedure, it is typical to have the following:   Discomfort at the port insertion site. Ice packs to the area will help.  Bruising on the skin over the port. This will subside in 3-4 days. HOME CARE INSTRUCTIONS  After your port is placed, you will get a manufacturer's information card. The card has information about your port. Keep this card with you at all times.   Know what kind of port you have. There are many types of ports available.   Wear a medical  alert bracelet in case of an emergency. This can help alert health care workers that you have a port.   The port can stay in for as long as your health care provider believes it is necessary.   A home health care nurse may give medicines and take care of the port.   You or a family member can get special training and directions for giving medicine and  taking care of the port at home.  SEEK MEDICAL CARE IF:   Your port does not flush or you are unable to get a blood return.   You have a fever or chills. SEEK IMMEDIATE MEDICAL CARE IF:  You have new fluid or pus coming from your incision.   You notice a bad smell coming from your incision site.   You have swelling, pain, or more redness at the incision or port site.   You have chest pain or shortness of breath. Document Released: 02/08/2013 Document Revised: 04/25/2013 Document Reviewed: 02/08/2013 [facility] Patient Information 2015 [place], [place]. This information is not intended to replace advice given to you by your health care provider. Make sure you discuss any questions you have with your health care provider.  May shower 24 - 48 hours post procedure. Remove dressing prior to shower.   Implanted [facility], The Guide An implanted port is a type of central line that is placed under the skin. Central lines are used to provide IV access when treatment or nutrition needs to be given through a person's veins. Implanted ports are used for long-term IV access. An implanted port may be placed because:  You need IV medicine that would be irritating to the small veins in your hands or arms.  You need long-term IV medicines, such as antibiotics.  You need IV nutrition for a long period.  You need frequent blood draws for lab tests.  You need dialysis.  Implanted ports are usually placed in the chest area, but they can also be placed in the upper arm, the abdomen, or the leg. An implanted port has two main parts:  Reservoir. The reservoir is round and will appear as a small, raised area under your skin. The reservoir is the part where a needle is inserted to give medicines or draw blood.  Catheter. The catheter is a thin, flexible tube that extends from the reservoir. The catheter is placed into a large vein. Medicine that is inserted into the reservoir goes into the catheter and then  into the vein.  HOW WILL I CARE FOR MY INCISION SITE? Do not get the incision site wet. Bathe or shower as directed by your health care provider.  HOW IS MY PORT ACCESSED? Special steps must be taken to access the port:  Before the port is accessed, a numbing cream can be placed on the skin. This helps numb the skin over the port site.  Your health care provider uses a sterile technique to access the port. Your health care provider must put on a mask and sterile gloves. The skin over your port is cleaned carefully with an antiseptic and allowed to dry. The port is gently pinched between sterile gloves, and a needle is inserted into the port. Only "non-coring" port needles should be used to access the port. Once the port is accessed, a blood return should be checked. This helps ensure that the port is in the vein and is not clogged.  If your port needs to remain accessed for a constant infusion, a clear (transparent)  bandage will be placed over the needle site. The bandage and needle will need to be changed every week, or as directed by your health care provider.  Keep the bandage covering the needle clean and dry. Do not get it wet. Follow your health care provider's instructions on how to take a shower or bath while the port is accessed.  If your port does not need to stay accessed, no bandage is needed over the port.  WHAT IS FLUSHING? Flushing helps keep the port from getting clogged. Follow your health care provider's instructions on how and when to flush the port. Ports are usually flushed with saline solution or a medicine called heparin. The need for flushing will depend on how the port is used.  If the port is used for intermittent medicines or blood draws, the port will need to be flushed:  After medicines have been given.  After blood has been drawn.  As part of routine maintenance.  If a constant infusion is running, the port may not need to be flushed.  HOW LONG WILL MY  PORT STAY IMPLANTED? The port can stay in for as long as your health care provider thinks it is needed. When it is time for the port to come out, surgery will be done to remove it. The procedure is similar to the one performed when the port was put in.  WHEN SHOULD I SEEK IMMEDIATE MEDICAL CARE? When you have an implanted port, you should seek immediate medical care if:  You notice a bad smell coming from the incision site.  You have swelling, redness, or drainage at the incision site.  You have more swelling or pain at the port site or the surrounding area.  You have a fever that is not controlled with medicine. Document Released: 04/20/2005 Document Revised: 02/08/2013 Document Reviewed: 12/26/2012 [facility] Patient Information 2015 [place], [place]. This information is not intended to replace advice given to you by your health care provider. Make sure you discuss any questions you have with your health care provider.

## 2014-01-31 NOTE — Procedures (Signed)
Successful placement of right IJ approach port-a-cath with tip at the superior caval atrial junction. The catheter is ready for immediate use. No immediate post procedural complications. 

## 2014-01-31 NOTE — Addendum Note (Signed)
Encounter addended by: Deirdre Evener, RN on: 01/31/2014  5:31 PM<BR>     Documentation filed: Charges VN

## 2014-01-31 NOTE — Progress Notes (Signed)
Chemo appt changed and injectino appt added.  New appt calendar printed and mailed to patient.

## 2014-01-31 NOTE — Progress Notes (Signed)
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   Primary site: Breast (Left)   Staging method: AJCC 7th Edition   Clinical: Stage IIB (T2, N1, cM0) signed by Eppie Gibson, MD on 01/31/2014 11:11 AM   Pathologic: (cM0)   Summary: Stage IIB (T2, N1, cM0)   SUMMARY OF ONCOLOGIC HISTORY: Oncology History   And     Breast cancer of upper-inner quadrant of left female breast   12/27/2013 Mammogram Mammogram and ultrasound 1.8 cm mass left breast 3:00; 7 cm from nipple with abnormal 1 left axillary lymph node, 2.8 x 1.8 x 1.7 cm, second adjacent smaller nodule 1.2 x 1 x 0.7 cm   01/10/2014 Initial Diagnosis 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        1 AxLN biopsy: ER 70% PR 0% Ki-67 80% HER-2 +2.92, gene copy #5.5    CHIEF COMPLIANT: Patient is here to sign consent for neoadjuvant chemotherapy with TC HP  INTERVAL HISTORY: Regina Deleon is a 63 year old African American lady with above-mentioned history of HER-2 positive breast cancer that was involving the lymph node in axilla. She is here today to discuss the neoadjuvant chemotherapy treatment. Last visit, I discussed with her that chemotherapy given in the adjuvant setting on neoadjuvant setting I discussed that chemotherapy with Taxotere carboplatin Herceptin and Perjeta. She underwent chemotherapy education class along with an echocardiogram of the heart and she is set up to undergo port placement today with the plan to start chemotherapy next Thursday. She understands the treatment plan and is anxious to get treatment started.  REVIEW OF SYSTEMS:   Constitutional: Denies fevers, chills or abnormal weight loss Eyes: Denies blurriness of vision Ears, nose, mouth, throat, and face: Denies mucositis or sore throat Respiratory: Denies cough, dyspnea  or wheezes Cardiovascular: Denies palpitation, chest discomfort or lower extremity swelling Gastrointestinal:  Denies nausea, heartburn or change in bowel habits Skin: Denies abnormal skin rashes Lymphatics: Denies new lymphadenopathy or easy bruising Neurological:Denies numbness, tingling or new weaknesses Behavioral/Psych: Anxious All other systems were reviewed with the patient and are negative.  I have reviewed the past medical history, past surgical history, social history and family history with the patient and they are unchanged from previous note.  ALLERGIES:  is allergic to darvocet; latex; enalapril; and penicillins.  MEDICATIONS:  Current Outpatient Prescriptions  Medication Sig Dispense Refill  . acetaminophen (TYLENOL) 500 MG tablet Take 500 mg by mouth every 6 (six) hours as needed for mild pain.      Marland Kitchen aspirin 81 MG tablet Take 81 mg by mouth every morning.       . cyclobenzaprine (FLEXERIL) 10 MG tablet       . dexamethasone (DECADRON) 4 MG tablet Take 2 tablets (8 mg total) by mouth 2 (two) times daily. Start the day before Taxotere. Then again the day after chemo for 3 days.  30 tablet  1  . fluticasone (FLONASE) 50 MCG/ACT nasal spray Place 2 sprays into both nostrils daily as needed for allergies or rhinitis.      Marland Kitchen HYDROcodone-acetaminophen (NORCO/VICODIN) 5-325 MG per tablet       . lidocaine-prilocaine (EMLA) cream Apply 1 application topically as needed.  30 g  0  . LORazepam (ATIVAN) 0.5 MG tablet Take 1 tablet (0.5 mg total) by mouth  every 6 (six) hours as needed (Nausea or vomiting).  30 tablet  0  . NIFEdipine (PROCARDIA-XL/ADALAT-CC/NIFEDICAL-XL) 30 MG 24 hr tablet Take 30 mg by mouth 2 (two) times daily.       . Omega-3 Fatty Acids (OMEGA 3 PO) Take 2 tablets by mouth every morning.      . ondansetron (ZOFRAN) 8 MG tablet Take 1 tablet (8 mg total) by mouth 2 (two) times daily. Start the day after chemo for 3 days. Then take as needed for nausea or vomiting.   30 tablet  1  . prochlorperazine (COMPAZINE) 10 MG tablet Take 1 tablet (10 mg total) by mouth every 6 (six) hours as needed (Nausea or vomiting).  30 tablet  1  . ranitidine (ZANTAC) 150 MG tablet Take 150 mg by mouth 2 (two) times daily.      Marland Kitchen triamterene-hydrochlorothiazide (MAXZIDE-25) 37.5-25 MG per tablet Take 0.5 tablets by mouth every morning. As needed.      . zolpidem (AMBIEN) 10 MG tablet Take 10 mg by mouth at bedtime as needed for sleep.       No current facility-administered medications for this visit.   Facility-Administered Medications Ordered in Other Visits  Medication Dose Route Frequency Provider Last Rate Last Dose  . 0.9 %  sodium chloride infusion   Intravenous Continuous D Kevin Allred, PA-C 50 mL/hr at 01/31/14 1430    . vancomycin (VANCOCIN) IVPB 1000 mg/200 mL premix  1,000 mg Intravenous On Call D Kevin Allred, PA-C        PHYSICAL EXAMINATION: ECOG PERFORMANCE STATUS: 0 - Asymptomatic  Filed Vitals:   01/31/14 1202  BP: 148/79  Pulse: 84  Temp: 98.3 F (36.8 C)  Resp: 18   Filed Weights   01/31/14 1202  Weight: 202 lb 11.2 oz (91.944 kg)    GENERAL:alert, no distress and comfortable SKIN: skin color, texture, turgor are normal, no rashes or significant lesions EYES: normal, Conjunctiva are pink and non-injected, sclera clear OROPHARYNX:no exudate, no erythema and lips, buccal mucosa, and tongue normal  NECK: supple, thyroid normal size, non-tender, without nodularity LYMPH:  no palpable lymphadenopathy in the cervical, axillary or inguinal LUNGS: clear to auscultation and percussion with normal breathing effort HEART: regular rate & rhythm and no murmurs and no lower extremity edema ABDOMEN:abdomen soft, non-tender and normal bowel sounds Musculoskeletal:no cyanosis of digits and no clubbing  NEURO: alert & oriented x 3 with fluent speech, no focal motor/sensory deficits  LABORATORY DATA:  I have reviewed the data as listed   Chemistry   No  results found for this basename: NA,  K,  CL,  CO2,  BUN,  CREATININE,  GLU   No results found for this basename: CALCIUM,  ALKPHOS,  AST,  ALT,  BILITOT       Lab Results  Component Value Date   WBC 8.3 01/31/2014   ASSESSMENT & PLAN:  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 Discussed the risks and benefits of chemotherapy once again including the risk of hair loss, nausea vomiting, cardiac side effects related to Herceptin and Perjeta, risk of infection, fatigue, cytopenias, liver and kidney abnormalities and neuropathy risk and patient is agreeable and signed the consent form today.  Patient will return back next Thursday for  initiation of cycle 1 chemotherapy.    No orders of the defined types were placed in this encounter.   The patient has a good understanding of the overall plan. she agrees with it. She will call with any problems that may develop before her next visit here.  I spent 30 minutes counseling the patient face to face. The total time spent in the appointment was 30 minutes and more than 50% was on counseling and review of test results    Rulon Eisenmenger, MD 01/31/2014 2:49 PM

## 2014-01-31 NOTE — Assessment & Plan Note (Signed)
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 Discussed the risks and benefits of chemotherapy once again including the risk of hair loss, nausea vomiting, cardiac side effects related to Herceptin and Perjeta, risk of infection, fatigue, cytopenias, liver and kidney abnormalities and neuropathy risk and patient is agreeable and signed the consent form today.  Patient will return back next Thursday for initiation of cycle 1 chemotherapy.

## 2014-01-31 NOTE — Progress Notes (Signed)
Echocardiogram 2D Echocardiogram has been performed.  Regina Deleon 01/31/2014, 11:50 AM

## 2014-01-31 NOTE — Progress Notes (Signed)
[employer], [id number], approved ondansetron from 01/01/14-01/28/15 case # [id number]

## 2014-01-31 NOTE — H&P (Signed)
Chief Complaint: New breast cancer diagnosis Need access for chemo  Referring Physician(s): Gudena,Vinay K  History of Present Illness: Regina Deleon is a 64 y.o. female  Pt just diagnosed with L breast cancer early Sept this yr Now scheduled for Port a Cath placement To start chemo next week Now scheduled for same  Past Medical History  Diagnosis Date  . Hypertension   . Hyperlipidemia   . Diabetes mellitus without complication     Past Surgical History  Procedure Laterality Date  . Laparoscopic gastric banding  10/19/05  . Abdominal hysterectomy  1986  . Back surgery  1990, 1991    Allergies: Darvocet; Latex; Enalapril; and Penicillins  Medications: Prior to Admission medications   Medication Sig Start Date End Date Taking? Authorizing Provider  acetaminophen (TYLENOL) 500 MG tablet Take 500 mg by mouth every 6 (six) hours as needed for mild pain.   Yes Historical Provider, MD  aspirin 81 MG tablet Take 81 mg by mouth every morning.    Yes Historical Provider, MD  fluticasone (FLONASE) 50 MCG/ACT nasal spray Place 2 sprays into both nostrils daily as needed for allergies or rhinitis.   Yes Historical Provider, MD  HYDROcodone-acetaminophen (NORCO/VICODIN) 5-325 MG per tablet  12/12/13  Yes Historical Provider, MD  NIFEdipine (PROCARDIA-XL/ADALAT-CC/NIFEDICAL-XL) 30 MG 24 hr tablet Take 30 mg by mouth 2 (two) times daily.    Yes Historical Provider, MD  Omega-3 Fatty Acids (OMEGA 3 PO) Take 2 tablets by mouth every morning.   Yes Historical Provider, MD  zolpidem (AMBIEN) 10 MG tablet Take 10 mg by mouth at bedtime as needed for sleep.   Yes Historical Provider, MD  cyclobenzaprine (FLEXERIL) 10 MG tablet  12/09/13   Historical Provider, MD  dexamethasone (DECADRON) 4 MG tablet Take 2 tablets (8 mg total) by mouth 2 (two) times daily. Start the day before Taxotere. Then again the day after chemo for 3 days. 01/31/14   Rulon Eisenmenger, MD  lidocaine-prilocaine (EMLA)  cream Apply 1 application topically as needed. 01/31/14   Rulon Eisenmenger, MD  LORazepam (ATIVAN) 0.5 MG tablet Take 1 tablet (0.5 mg total) by mouth every 6 (six) hours as needed (Nausea or vomiting). 01/31/14   Rulon Eisenmenger, MD  ondansetron (ZOFRAN) 8 MG tablet Take 1 tablet (8 mg total) by mouth 2 (two) times daily. Start the day after chemo for 3 days. Then take as needed for nausea or vomiting. 01/31/14   Rulon Eisenmenger, MD  prochlorperazine (COMPAZINE) 10 MG tablet Take 1 tablet (10 mg total) by mouth every 6 (six) hours as needed (Nausea or vomiting). 01/31/14   Rulon Eisenmenger, MD  ranitidine (ZANTAC) 150 MG tablet Take 150 mg by mouth 2 (two) times daily.    Historical Provider, MD  triamterene-hydrochlorothiazide (MAXZIDE-25) 37.5-25 MG per tablet Take 0.5 tablets by mouth every morning. As needed.    Historical Provider, MD    Family History  Problem Relation Age of Onset  . Cancer Maternal Aunt   . Cancer Cousin   . Cancer Cousin   . Cancer Cousin     History   Social History  . Marital Status: Married    Spouse Name: N/A    Number of Children: N/A  . Years of Education: N/A   Social History Main Topics  . Smoking status: Never Smoker   . Smokeless tobacco: Never Used  . Alcohol Use: No  . Drug Use: No  . Sexual Activity: Yes  Other Topics Concern  . None   Social History Narrative  . None    Review of Systems: A 12 point ROS discussed and pertinent positives are indicated in the HPI above.  All other systems are negative.  Review of Systems  Constitutional: Negative for fever, activity change, appetite change and unexpected weight change.  Respiratory: Negative for cough, chest tightness, shortness of breath and wheezing.   Cardiovascular: Negative for chest pain.  Gastrointestinal: Negative for nausea, vomiting and abdominal pain.  Musculoskeletal: Negative for back pain and neck pain.  Skin: Negative for color change.  Neurological: Negative for weakness.    Psychiatric/Behavioral: Negative for behavioral problems and confusion.    Vital Signs: BP 123/78  Pulse 82  Temp(Src) 97.8 F (36.6 C) (Oral)  Resp 18  Ht 5' 3"  (1.6 m)  Wt 90.719 kg (200 lb)  BMI 35.44 kg/m2  SpO2 100%  LMP 06/02/1984  Physical Exam  Constitutional: She is oriented to person, place, and time. She appears well-nourished.  Cardiovascular: Normal rate, regular rhythm and normal heart sounds.   No murmur heard. Pulmonary/Chest: Effort normal and breath sounds normal. No respiratory distress. She has no wheezes.  Abdominal: Soft. Bowel sounds are normal. There is no tenderness.  Musculoskeletal: Normal range of motion.  Neurological: She is alert and oriented to person, place, and time.  Skin: Skin is warm and dry.  Psychiatric: She has a normal mood and affect. Her behavior is normal. Judgment and thought content normal.    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  THREE-DIMENSIONAL MR IMAGE RENDERING ON INDEPENDENT WORKSTATION:  Three-dimensional MR images were rendered by post-processing of the original MR data on an independent workstation. The three-dimensional MR images were interpreted, and findings are reported in the following complete MRI report for this study. Three dimensional images were evaluated at the independent DynaCad workstation  COMPARISON:  01/10/2014, 12/27/2013 and prior studies, CT abdomen pelvis performed August 03, 2005  FINDINGS: Breast composition: b.  Scattered fibroglandular tissue.  Background parenchymal enhancement: Moderate  Right breast: No mass or abnormal enhancement.  Left  breast: There is artifact associated with biopsy marker clip in the 3 o'clock position of the left breast at the middle third depth. Comparison with 01/10/2014 shows that the clip is along the posterior aspect of the mass that was biopsied. There is extensive enhancement both anterior and posterior to the marker clip and associated mass, which focally measures about 16 x 17 x 10 mm. The enhancement posterior to the mass follows a band that connects with the dermis and is most consistent with post biopsy change. However, extending anterior and medial to the mass, there is abnormal non mass like enhancement associated with architectural distortion. This enhancement extends 32 mm anterior in 28 mm medial to the mass. Along the antero medial aspect of this abnormal enhancement, there is a 1 cm enhancing mass with irregular borders consistent with a satellite nodule.  Lymph nodes: There are 2 abnormal lymph nodes in the left axilla. The larger measures 25 x 25 x 38 mm and shows abnormal enhancement with loss of fatty hilum. The smaller measures 14 x 8 mm  and shows cortical thickening.  Ancillary findings: There is a partially visualized rounded or oval T2 hyperintense lesion involving the medial left lobe of the liver. It does not appear to enhance and it measures approximately 8cm.  IMPRESSION: 1. Known invasive carcinoma 3 o'clock position left breast marker clip is seen along the posterior aspect of an approximately 17 mm mass. Architectural distortion and abnormal non masslike enhancement concerning for malignancy extend both anterior and medial to the mass, with a suspicious satellite nodule along the antrum medial edge of this process. If surgical planning would be altered by demonstration of malignancy of this nodule, MRI guided biopsy would be suggested. 2. Posterior and lateral to the marker clip, there is abnormal enhancement that appears most consistent with biopsy tract and associated post biopsy change. If  it persisted at the time of anticipated MRI guided core biopsy, this more posterior enhancement could also be biopsied to document extent of disease. 3. There are 2 abnormal left axillary lymph nodes. 4. Cyst in the medial left lobe of the liver mildly enlarged when compared to CT scan performed in 2007, only partially imaged on the current study.  RECOMMENDATION: Consider MRI guided biopsy to document extent of disease as described above.  BI-RADS CATEGORY  6: Known biopsy-proven malignancy.   Electronically Signed   By: Skipper Cliche M.D.   On: 01/19/2014 15:20   Mm Digital Diagnostic Unilat L  01/25/2014   CLINICAL DATA:  Status post MR guided biopsies of a 1 cm mass like area enhancement in the anterior aspect of the lower outer quadrant of the left breast and a 1.6 cm area of clumped nodular enhancement in the posterior aspect of the upper outer quadrant of the left breast.  EXAM: DIAGNOSTIC LEFT MAMMOGRAM POST ULTRASOUND BIOPSY  COMPARISON:  Previous exams  FINDINGS: Mammographic images were obtained following MR guided biopsy of a 1 cm mass like area of enhancement in the anterior aspect of the lower outer quadrant of the left breast and a 1.6 cm area of clumped nodular enhancement in the posterior aspect of the upper-outer quadrant of the left breast. These demonstrate an hourglass shaped biopsy marker clip at the location of the 1 cm mass like area biopsied in the anterior aspect of the lower outer quadrant of the left breast and a cylinder-shaped biopsy marker clip at the location of the 1.6 cm area of clumped nodular enhancement biopsied in the posterior aspect of the upper-outer quadrant of the left breast. These clips are located 4.1 cm apart from each other. The cylinder-shaped clip is located 2.1 cm superior to the ribbon shaped clip within the recently biopsied invasive ductal carcinoma.  IMPRESSION: Appropriate clip deployments, spaced 4.1 cm apart, as described above.  Final Assessment: Post  Procedure Mammograms for Marker Placement   Electronically Signed   By: Enrique Sack M.D.   On: 01/25/2014 10:43   Mm Digital Diagnostic Unilat L  01/15/2014   CLINICAL DATA:  Post left breast ultrasound-guided core biopsy.  EXAM: DIAGNOSTIC left breast MAMMOGRAM POST ULTRASOUND BIOPSY  COMPARISON:  Previous exams  FINDINGS: Mammographic images were obtained following ultrasound guided biopsy of the mass located within the left breast at the 3 o'clock position. The ribbon shaped clip is in appropriate position.  IMPRESSION: Appropriate positioning of clip following left breast ultrasound-guided core biopsy.  Final Assessment: Post Procedure Mammograms for Marker Placement   Electronically Signed   By: Luberta Robertson M.D.   On: 01/15/2014 14:20   Mm  Digital Diagnostic Unilat L  01/10/2014   CLINICAL DATA:  Recall from screening mammogram.  EXAM: DIGITAL DIAGNOSTIC  left breast MAMMOGRAM  ULTRASOUND left BREAST  COMPARISON:  12/27/2013 and 04/20/2012 screening mammograms.  ACR Breast Density Category b: There are scattered areas of fibroglandular density.  FINDINGS: Spot compression views of the left breast demonstrate an irregular mass to be present laterally located within the left breast at approximately the 3 o'clock position. By mammography this measures 2.1 cm in size.  On physical exam, there is a firm, palpable mass located within the left breast at 3 o'clock position 7 cm from the nipple corresponding to the mammographic finding. By physical examination this measures approximately 2 cm in size. No palpable left axillary adenopathy is present.  Ultrasound is performed, showing an irregularly marginated hypoechoic mass located within the left breast at the 3 o'clock position 7 cm from the nipple corresponding to the palpable and mammographic finding. This measures 1.8 x 1.2 x 1.0 cm in size and is worrisome for invasive mammary carcinoma.  Ultrasound of the left axilla demonstrates an abnormal appearing  level 1 left axillary lymph node with loss of the normal fatty hilum and increased vascularity on color-flow imaging. This measures 2.8 x 1.8 x 1.7 cm in size. There is a second, adjacent, smaller abnormal appearing left axillary lymph node present with cortical thickening. This measures 1.2 x 1.0 x 0.7 cm in size.  I have discussed left breast ultrasound-guided core biopsy and ultrasound-guided core biopsy of the abnormal appearing larger left axillary lymph node with the patient. She would like to proceed with this. This will be performed and reported separately.  IMPRESSION: 1.8 cm suspicious mass located within the left breast at the 3 o'clock position 7 cm from the nipple with abnormal appearing level 1 left axillary lymph nodes. Tissue sampling of the breast mass and larger left axillary lymph node is recommended and will be performed and reported separately.  RECOMMENDATION: Ultrasound-guided core biopsy of the left breast mass at the 3 o'clock position and abnormal appearing left axillary lymph node.  I have discussed the findings and recommendations with the patient. Results were also provided in writing at the conclusion of the visit. If applicable, a reminder letter will be sent to the patient regarding the next appointment.  BI-RADS CATEGORY  5: Highly suggestive of malignancy.   Electronically Signed   By: Luberta Robertson M.D.   On: 01/10/2014 11:11   [place] Axilla  01/10/2014   CLINICAL DATA:  Recall from screening mammogram.  EXAM: DIGITAL DIAGNOSTIC  left breast MAMMOGRAM  ULTRASOUND left BREAST  COMPARISON:  12/27/2013 and 04/20/2012 screening mammograms.  ACR Breast Density Category b: There are scattered areas of fibroglandular density.  FINDINGS: Spot compression views of the left breast demonstrate an irregular mass to be present laterally located within the left breast at approximately the 3 o'clock position. By mammography this measures 2.1 cm in size.  On physical exam,  there is a firm, palpable mass located within the left breast at 3 o'clock position 7 cm from the nipple corresponding to the mammographic finding. By physical examination this measures approximately 2 cm in size. No palpable left axillary adenopathy is present.  Ultrasound is performed, showing an irregularly marginated hypoechoic mass located within the left breast at the 3 o'clock position 7 cm from the nipple corresponding to the palpable and mammographic finding. This measures 1.8 x 1.2 x 1.0 cm in size and is  worrisome for invasive mammary carcinoma.  Ultrasound of the left axilla demonstrates an abnormal appearing level 1 left axillary lymph node with loss of the normal fatty hilum and increased vascularity on color-flow imaging. This measures 2.8 x 1.8 x 1.7 cm in size. There is a second, adjacent, smaller abnormal appearing left axillary lymph node present with cortical thickening. This measures 1.2 x 1.0 x 0.7 cm in size.  I have discussed left breast ultrasound-guided core biopsy and ultrasound-guided core biopsy of the abnormal appearing larger left axillary lymph node with the patient. She would like to proceed with this. This will be performed and reported separately.  IMPRESSION: 1.8 cm suspicious mass located within the left breast at the 3 o'clock position 7 cm from the nipple with abnormal appearing level 1 left axillary lymph nodes. Tissue sampling of the breast mass and larger left axillary lymph node is recommended and will be performed and reported separately.  RECOMMENDATION: Ultrasound-guided core biopsy of the left breast mass at the 3 o'clock position and abnormal appearing left axillary lymph node.  I have discussed the findings and recommendations with the patient. Results were also provided in writing at the conclusion of the visit. If applicable, a reminder letter will be sent to the patient regarding the next appointment.  BI-RADS CATEGORY  5: Highly suggestive of malignancy.    Electronically Signed   By: Luberta Robertson M.D.   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 This was found to be concordant by Dr. Luberta Robertson. Pathology was discussed with the patient by telephone. She reported doing well after the biopsy. Post biopsy instructions were reviewed and her questions were answered. Surgical consultation has been scheduled with Dr. Alphonsa Overall at [facility] on January 12, 2014. A bilateral breast MRI will be scheduled. She is encouraged to come to [facility] for educational materials. My number was provided for her for future questions and concerns.  Pathology results reported by Susa Raring RN, BSN on January 11, 2014.   Electronically Signed   By: Luberta Robertson M.D.   On: 01/12/2014 08:56   01/15/2014   CLINICAL DATA:  Left breast mass with abnormal appearing left axillary lymph node.  EXAM: ULTRASOUND GUIDED LEFT BREAST CORE NEEDLE BIOPSY  COMPARISON:  Previous exams.  FINDINGS: I met with the patient and we discussed the procedure of ultrasound-guided biopsy, including benefits and alternatives. We discussed the high likelihood of a successful procedure. We discussed the risks of the procedure, including infection, bleeding, tissue injury, clip migration, and inadequate sampling. Informed written consent was given. The usual time-out protocol was performed immediately prior to the procedure.  Using sterile technique and 2% Lidocaine as local anesthetic, under direct ultrasound visualization, a 14 gauge spring-loaded device was used to perform biopsy of of the mass located within the left breast at the 3 o'clock position using a lateral approach. At the conclusion of the procedure a ribbon  shaped tissue marker clip was deployed into the biopsy cavity. Follow up 2 view mammogram was performed and dictated separately.  IMPRESSION: Ultrasound guided biopsy of the left breast mass located at the 3 o'clock position as discussed above. No apparent complications.  Electronically Signed: By: Louie Casa  Glennon Mac M.D. 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 This was found to be concordant by Dr. Luberta Robertson. Pathology was discussed with the patient by telephone. She reported doing well after the biopsy. Post biopsy instructions were reviewed and her questions were answered. Surgical consultation has been scheduled with Dr. Alphonsa Overall at [facility] on January 12, 2014. A bilateral breast MRI will be scheduled. She is encouraged to come to [facility] for educational materials. My number was provided for her for future questions and concerns.  Pathology results reported by Susa Raring RN, BSN on January 11, 2014.   Electronically Signed   By: Luberta Robertson M.D.   On: 01/11/2014 12:57   01/15/2014   CLINICAL DATA:  Left breast mass with abnormal appearing left axillary lymph node.  EXAM: ULTRASOUND GUIDED BIOPSY OF ABNORMAL APPEARING LEFT AXILLARY LYMPH NODE  COMPARISON:  Previous exams.  FINDINGS: I met with the patient and we discussed the procedure of ultrasound-guided biopsy, including benefits and alternatives. We discussed the high likelihood of a successful procedure. We discussed the risks of the procedure, including infection, bleeding, tissue injury, clip migration, and inadequate sampling. Informed written consent was given. The usual time-out protocol was performed immediately prior to the procedure.  Using  sterile technique and 2% Lidocaine as local anesthetic, under direct ultrasound visualization, a 14 gauge spring-loaded device was used to perform biopsy of the enlarged abnormal appearing level 1 left axillary lymph node using a lateral approach.  IMPRESSION: Ultrasound guided biopsy of the abnormal appearing left axillary lymph nodes as discussed above. No apparent complications.  Electronically Signed: By: Luberta Robertson M.D. On: 01/10/2014 11:18   Mr Aundra Millet Breast Bx Johnella Moloney Dev 1st Lesion Image Bx Spec Mr Guide  01/30/2014   ADDENDUM REPORT: 01/30/2014 09:42  ADDENDUM: Pathology revealed grade III invasive ductal carcinoma and ductal carcinoma in situ in both the anterior lower outer quadrant and posterior upper outer quadrant left breast biopsies. This was found to be concordant by Dr. Enrique Sack. Pathology was discussed with the patient by telephone. Post biopsy instructions were reviewed and her questions were answered. 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 She is to follow her treatment plan.  Pathology results reported by Susa Raring RN, BSN on January 30, 2014.   Electronically Signed   By: Enrique Sack M.D.   On: 01/30/2014 09:42   01/30/2014   CLINICAL DATA:  1 cm mass like area of enhancement in the lower outer quadrant of the left breast, anterior to a recently biopsied invasive ductal carcinoma.  EXAM: MRI GUIDED CORE NEEDLE BIOPSY OF THE LEFT BREAST  TECHNIQUE: Multiplanar, multisequence MR imaging of the left breast was performed both before and after administration of intravenous contrast.  CONTRAST:  67m MULTIHANCE GADOBENATE DIMEGLUMINE 529 MG/ML IV SOLN  COMPARISON:  Previous exams.  FINDINGS: I met with the patient, and we discussed the procedure of MRI guided biopsy, including risks, benefits, and alternatives. Specifically, we discussed the risks of infection, bleeding, tissue injury, clip migration,  and inadequate sampling. Informed, written consent was given. The usual time out protocol was performed immediately  prior to the procedure.  Using sterile technique, 2% Lidocaine, MRI guidance, and a 9 gauge vacuum assisted device, biopsy was performed of the recently demonstrated 1 cm mass like area of enhancement anterior to the recently biopsied invasive ductal carcinoma in the outer left breast using a lateral approach. At the conclusion of the procedure, an hourglass shaped tissue marker clip was deployed into the biopsy cavity. Follow-up 2-view mammogram was performed and dictated separately.  IMPRESSION: MRI guided biopsy of a 1 cm mass like area of enhancement in anterior aspect of the lower outer quadrant of the left breast. No apparent complications.  Electronically Signed: By: Enrique Sack M.D. On: 01/25/2014 10:36   Mr Aundra Millet Breast Bx W Loc Dev Ea Add Lesion Image Bx Spec Mr Guide  01/30/2014   ADDENDUM REPORT: 01/30/2014 09:42  ADDENDUM: Pathology revealed grade III invasive ductal carcinoma and ductal carcinoma in situ in both the anterior lower outer quadrant and posterior upper outer quadrant left breast biopsies. This was found to be concordant by Dr. Enrique Sack. Pathology was discussed with the patient by telephone. Post biopsy instructions were reviewed and her questions were answered. 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 She is to follow her treatment plan.  Pathology results reported by Susa Raring RN, BSN on January 30, 2014.   Electronically Signed   By: Enrique Sack M.D.   On: 01/30/2014 09:42   01/30/2014   CLINICAL DATA:  Area of enhancement posterior to a recently diagnosed invasive ductal carcinoma in the outer left breast. This had a linear extension to the skin surface on a recent staging MRI a, suggesting a post biopsy tract.  EXAM: MRI GUIDED CORE NEEDLE BIOPSY OF THE LEFT BREAST  TECHNIQUE:  Multiplanar, multisequence MR imaging of the left breast was performed both before and after administration of intravenous contrast.  CONTRAST:  49m MULTIHANCE GADOBENATE DIMEGLUMINE 529 MG/ML IV SOLN  COMPARISON:  Previous exams.  FINDINGS: I met with the patient, and we discussed the procedure of MRI guided biopsy, including risks, benefits, and alternatives. Specifically, we discussed the risks of infection, bleeding, tissue injury, clip migration, and inadequate sampling. Informed, written consent was given. The usual time out protocol was performed immediately prior to the procedure.  The pre biopsy images did not demonstrate the previously seen linear extension to the skin surface posterior to the recently biopsied invasive ductal carcinoma. However, there was a 1.6 x 0.8 x 0.6 cm area of clumped nodular enhancement in the upper-outer quadrant of the breast, posterior to the recently biopsied invasive ductal carcinoma, suspicious for ductal carcinoma in situ.  Using sterile technique, 2% Lidocaine, MRI guidance, and a 9 gauge vacuum assisted device, biopsy was performed of the 1.6 cm area of clumped nodular enhancement in the upper-outer quadrant of the left breast, posteriorly, using a lateral approach. At the conclusion of the procedure, a cylinder-shaped tissue marker clip was deployed into the biopsy cavity. Follow-up 2-view mammogram was performed and dictated separately.  IMPRESSION: MRI guided biopsy of a 1.6 cm area of clumped nodular enhancement in the posterior aspect of the upper-outer quadrant of the left breast. No apparent complications.  Electronically Signed: By: SEnrique SackM.D. On: 01/25/2014 10:40    Labs: Lab Results  Component Value Date   WBC 8.3 01/31/2014   HCT 39.0 01/31/2014   MCV 86.9 01/31/2014   PLT 301 01/31/2014    Assessment and Plan:  Breast cancer Need access for chemo Scheduled for PAC placement today Pt aware of procedure benefits and risks and agreeable to  proceed Consent signed andin chart  Thank you for this interesting consult.  I greatly enjoyed meeting Regina Deleon and look forward to participating in their care.    I spent a total of 20 minutes face to face in clinical consultation, greater than 50% of which was counseling/coordinating care for [facility] placement  Signed: Shaquille Janes A 01/31/2014, 2:51 PM

## 2014-01-31 NOTE — Progress Notes (Signed)
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 female who presented with palpable lateral left breast lesion.  She has skipped her mammogram in 2014. 12-27-13 mammograms showed In the left breast, possible masses; In the right breast, no findings suspicious for malignancy. US revealed 1.8 cm suspicious mass located within the left breast at the 3 o'clock position 7 cm from the nipple with abnormal appearing level 1 left axillary lymph nodes.  Biopsy on 9-9 showed 1) left axilla 3:00 breast, invasive ductal carcinoma with DCIS, ER25%+, PR weakly4%+, HER2neu +.  2) axillary node positive for ductal carcinoma, ER17%, PR 0% Her2neu +.  MRI showed the following notable issues 1)  Known invasive carcinoma 3 o'clock position, 17 mm mass. Architectural distortion and abnormal non masslike enhancement concerning for malignancy extend both anterior and medial to the mass, with a suspicious satellite nodule along the antrum medial edge of this process.   2. Posterior and lateral to the marker clip, there is abnormal enhancement that appears most consistent with biopsy tract and associated post biopsy change  3. There are 2 abnormal left axillary lymph nodes.    Biopsy of UOQ and LOQ lesions from the MRI were then done on 9-24, showing Grade III ICD with DCIS; UOQ was HER2neuPositive, LOQ was HER2neg. I do not see ER/PR status.  My understanding is that she may have two primary tumors in the lateral left breast, with somewhat extensive processes per her MRI, probably at least T2 disease, multicentric. Also, 2 nodes are  clinically suspicious, with at least one biopsy proven disease. I will add her to tumor board to clarify this.  She lives in [place] but wants all treatment here.   PREVIOUS RADIATION THERAPY: No  PAST MEDICAL HISTORY:  has a past medical history of Hypertension; Hyperlipidemia; and Diabetes mellitus without complication.    PAST SURGICAL HISTORY: Past Surgical History  Procedure Laterality Date  . Laparoscopic gastric banding  10/19/05  . Abdominal hysterectomy  1986  . Back surgery  1990, 1991    FAMILY HISTORY: family history includes Cancer in her cousin, cousin, cousin, and maternal aunt.  SOCIAL HISTORY:  reports that she has never smoked. She has never used smokeless tobacco. She reports that she does not drink alcohol or use illicit drugs.  ALLERGIES: Darvocet; Latex; Enalapril; and Penicillins  MEDICATIONS:  Current Outpatient Prescriptions  Medication Sig Dispense Refill  . acetaminophen (TYLENOL) 500 MG tablet Take 500 mg by mouth every 6 (six) hours as needed for mild pain.      Marland Kitchen aspirin 81 MG tablet Take 81 mg by mouth every morning.       . fluticasone (FLONASE) 50 MCG/ACT nasal spray Place 2 sprays into both nostrils daily as needed for allergies or rhinitis.      Marland Kitchen NIFEdipine (PROCARDIA-XL/ADALAT-CC/NIFEDICAL-XL) 30 MG 24 hr tablet Take 30 mg by mouth 2 (two) times daily.       . Omega-3 Fatty Acids (OMEGA 3 PO) Take 2 tablets by mouth every morning.      . ranitidine (ZANTAC) 150 MG tablet Take 150 mg by mouth 2 (two)  times daily.      Marland Kitchen triamterene-hydrochlorothiazide (MAXZIDE-25) 37.5-25 MG per tablet Take 0.5 tablets by mouth every morning. As needed.      . zolpidem (AMBIEN) 10 MG tablet Take 10 mg by mouth at bedtime as needed for sleep.      . cyclobenzaprine (FLEXERIL) 10 MG tablet       . HYDROcodone-acetaminophen (NORCO/VICODIN) 5-325 MG per tablet        No current facility-administered medications for this encounter.    REVIEW OF SYSTEMS:   Notable for that above.   PHYSICAL EXAM:  height is 5' 3"  (1.6 m) and weight is 203 lb 14.4 oz (92.488 kg). Her temperature is 98 F (36.7 C). Her blood pressure is 117/62 and her pulse is 73.   General: Alert and oriented, in no acute distress HEENT: Head is normocephalic. Pupils are equally round and reactive to light. Extraocular movements are intact. Oropharynx is clear. Neck: Neck is supple, no palpable cervical or supraclavicular lymphadenopathy. Heart: Regular in rate and rhythm with no murmurs, rubs, or gallops. Chest: Clear to auscultation bilaterally, with no rhonchi, wheezes, or rales. Abdomen: Soft, nontender, nondistended, with no rigidity or guarding. Extremities: No cyanosis or edema. Lymphatics: see neck Skin: No concerning lesions. Musculoskeletal: symmetric strength and muscle tone throughout. Neurologic: Cranial nerves II through XII are grossly intact. No obvious focalities. Speech is fluent. Coordination is intact. Psychiatric: Judgment and insight are intact. Affect is appropriate. Breasts: left breast notable for postoperative swelling, and palpable LOQ 2.5cm mass and UOQ 1.5 cm mass. Cannot appreciate axillary adenopathy.  Right breast/axilla unremarkable.   ECOG = 0  0 - Asymptomatic (Fully active, able to carry on all predisease activities without restriction)  1 - Symptomatic but completely ambulatory (Restricted in physically strenuous activity but ambulatory and able to carry out work of a light or sedentary nature. For example, light housework, office work)  2 - Symptomatic, <50% in bed during the day (Ambulatory and capable of all self care but unable to carry out any work activities. Up and about more than 50% of waking hours)  3 - Symptomatic, >50% in bed, but not bedbound (Capable of only limited self-care, confined to bed or chair 50% or more of waking hours)  4 - Bedbound (Completely disabled. Cannot carry on any self-care. Totally confined to bed or  chair)  5 - Death   Eustace Pen MM, Creech RH, Tormey DC, et al. [phone]). "Toxicity and response criteria of the [facility]". [place] Oncol. 5 (6): 649-55   LABORATORY DATA:  No results found for this basename: WBC, HGB, HCT, MCV, PLT   CMP  No results found for this basename: na, k, cl, co2, glucose, bun, creatinine, calcium, prot, albumin, ast, alt, alkphos, bilitot, gfrnonaa, gfraa         RADIOGRAPHY: Mr Breast Bilateral W Wo Contrast  01/19/2014   CLINICAL DATA:  Invasive ductal carcinoma 3 o'clock position left breast with metastatic left axillary lymph node for staging  LABS:  n/a  EXAM: BILATERAL BREAST MRI WITH AND WITHOUT CONTRAST  TECHNIQUE: Multiplanar, multisequence MR images of both breasts were obtained prior to and following the intravenous administration of 57m of MultiHance.  THREE-DIMENSIONAL MR IMAGE RENDERING ON INDEPENDENT WORKSTATION:  Three-dimensional MR images were rendered by post-processing of the original MR data on an independent workstation. The three-dimensional MR images were interpreted, and findings are reported in the following complete MRI report for this study. Three dimensional images were evaluated  at the independent DynaCad workstation  COMPARISON:  01/10/2014, 12/27/2013 and prior studies, CT abdomen pelvis performed August 03, 2005  FINDINGS: Breast composition: b.  Scattered fibroglandular tissue.  Background parenchymal enhancement: Moderate  Right breast: No mass or abnormal enhancement.  Left breast: There is artifact associated with biopsy marker clip in the 3 o'clock position of the left breast at the middle third depth. Comparison with 01/10/2014 shows that the clip is along the posterior aspect of the mass that was biopsied. There is extensive enhancement both anterior and posterior to the marker clip and associated mass, which focally measures about 16 x 17 x 10 mm. The enhancement posterior to the mass follows a band that  connects with the dermis and is most consistent with post biopsy change. However, extending anterior and medial to the mass, there is abnormal non mass like enhancement associated with architectural distortion. This enhancement extends 32 mm anterior in 28 mm medial to the mass. Along the antero medial aspect of this abnormal enhancement, there is a 1 cm enhancing mass with irregular borders consistent with a satellite nodule.  Lymph nodes: There are 2 abnormal lymph nodes in the left axilla. The larger measures 25 x 25 x 38 mm and shows abnormal enhancement with loss of fatty hilum. The smaller measures 14 x 8 mm and shows cortical thickening.  Ancillary findings: There is a partially visualized rounded or oval T2 hyperintense lesion involving the medial left lobe of the liver. It does not appear to enhance and it measures approximately 8cm.  IMPRESSION: 1. Known invasive carcinoma 3 o'clock position left breast marker clip is seen along the posterior aspect of an approximately 17 mm mass. Architectural distortion and abnormal non masslike enhancement concerning for malignancy extend both anterior and medial to the mass, with a suspicious satellite nodule along the antrum medial edge of this process. If surgical planning would be altered by demonstration of malignancy of this nodule, MRI guided biopsy would be suggested. 2. Posterior and lateral to the marker clip, there is abnormal enhancement that appears most consistent with biopsy tract and associated post biopsy change. If it persisted at the time of anticipated MRI guided core biopsy, this more posterior enhancement could also be biopsied to document extent of disease. 3. There are 2 abnormal left axillary lymph nodes. 4. Cyst in the medial left lobe of the liver mildly enlarged when compared to CT scan performed in 2007, only partially imaged on the current study.  RECOMMENDATION: Consider MRI guided biopsy to document extent of disease as described above.   BI-RADS CATEGORY  6: Known biopsy-proven malignancy.   Electronically Signed   By: Skipper Cliche M.D.   On: 01/19/2014 15:20   Mm Digital Diagnostic Unilat L  01/25/2014   CLINICAL DATA:  Status post MR guided biopsies of a 1 cm mass like area enhancement in the anterior aspect of the lower outer quadrant of the left breast and a 1.6 cm area of clumped nodular enhancement in the posterior aspect of the upper outer quadrant of the left breast.  EXAM: DIAGNOSTIC LEFT MAMMOGRAM POST ULTRASOUND BIOPSY  COMPARISON:  Previous exams  FINDINGS: Mammographic images were obtained following MR guided biopsy of a 1 cm mass like area of enhancement in the anterior aspect of the lower outer quadrant of the left breast and a 1.6 cm area of clumped nodular enhancement in the posterior aspect of the upper-outer quadrant of the left breast. These demonstrate an hourglass shaped biopsy marker clip at  the location of the 1 cm mass like area biopsied in the anterior aspect of the lower outer quadrant of the left breast and a cylinder-shaped biopsy marker clip at the location of the 1.6 cm area of clumped nodular enhancement biopsied in the posterior aspect of the upper-outer quadrant of the left breast. These clips are located 4.1 cm apart from each other. The cylinder-shaped clip is located 2.1 cm superior to the ribbon shaped clip within the recently biopsied invasive ductal carcinoma.  IMPRESSION: Appropriate clip deployments, spaced 4.1 cm apart, as described above.  Final Assessment: Post Procedure Mammograms for Marker Placement   Electronically Signed   By: Enrique Sack M.D.   On: 01/25/2014 10:43   Mm Digital Diagnostic Unilat L  01/15/2014   CLINICAL DATA:  Post left breast ultrasound-guided core biopsy.  EXAM: DIAGNOSTIC left breast MAMMOGRAM POST ULTRASOUND BIOPSY  COMPARISON:  Previous exams  FINDINGS: Mammographic images were obtained following ultrasound guided biopsy of the mass located within the left breast at  the 3 o'clock position. The ribbon shaped clip is in appropriate position.  IMPRESSION: Appropriate positioning of clip following left breast ultrasound-guided core biopsy.  Final Assessment: Post Procedure Mammograms for Marker Placement   Electronically Signed   By: Luberta Robertson M.D.   On: 01/15/2014 14:20   Mm Digital Diagnostic Unilat L  01/10/2014   CLINICAL DATA:  Recall from screening mammogram.  EXAM: DIGITAL DIAGNOSTIC  left breast MAMMOGRAM  ULTRASOUND left BREAST  COMPARISON:  12/27/2013 and 04/20/2012 screening mammograms.  ACR Breast Density Category b: There are scattered areas of fibroglandular density.  FINDINGS: Spot compression views of the left breast demonstrate an irregular mass to be present laterally located within the left breast at approximately the 3 o'clock position. By mammography this measures 2.1 cm in size.  On physical exam, there is a firm, palpable mass located within the left breast at 3 o'clock position 7 cm from the nipple corresponding to the mammographic finding. By physical examination this measures approximately 2 cm in size. No palpable left axillary adenopathy is present.  Ultrasound is performed, showing an irregularly marginated hypoechoic mass located within the left breast at the 3 o'clock position 7 cm from the nipple corresponding to the palpable and mammographic finding. This measures 1.8 x 1.2 x 1.0 cm in size and is worrisome for invasive mammary carcinoma.  Ultrasound of the left axilla demonstrates an abnormal appearing level 1 left axillary lymph node with loss of the normal fatty hilum and increased vascularity on color-flow imaging. This measures 2.8 x 1.8 x 1.7 cm in size. There is a second, adjacent, smaller abnormal appearing left axillary lymph node present with cortical thickening. This measures 1.2 x 1.0 x 0.7 cm in size.  I have discussed left breast ultrasound-guided core biopsy and ultrasound-guided core biopsy of the abnormal appearing larger  left axillary lymph node with the patient. She would like to proceed with this. This will be performed and reported separately.  IMPRESSION: 1.8 cm suspicious mass located within the left breast at the 3 o'clock position 7 cm from the nipple with abnormal appearing level 1 left axillary lymph nodes. Tissue sampling of the breast mass and larger left axillary lymph node is recommended and will be performed and reported separately.  RECOMMENDATION: Ultrasound-guided core biopsy of the left breast mass at the 3 o'clock position and abnormal appearing left axillary lymph node.  I have discussed the findings and recommendations with the patient. Results were also provided in  writing at the conclusion of the visit. If applicable, a reminder letter will be sent to the patient regarding the next appointment.  BI-RADS CATEGORY  5: Highly suggestive of malignancy.   Electronically Signed   By: Luberta Robertson M.D.   On: 01/10/2014 11:11   [place] Axilla  01/10/2014   CLINICAL DATA:  Recall from screening mammogram.  EXAM: DIGITAL DIAGNOSTIC  left breast MAMMOGRAM  ULTRASOUND left BREAST  COMPARISON:  12/27/2013 and 04/20/2012 screening mammograms.  ACR Breast Density Category b: There are scattered areas of fibroglandular density.  FINDINGS: Spot compression views of the left breast demonstrate an irregular mass to be present laterally located within the left breast at approximately the 3 o'clock position. By mammography this measures 2.1 cm in size.  On physical exam, there is a firm, palpable mass located within the left breast at 3 o'clock position 7 cm from the nipple corresponding to the mammographic finding. By physical examination this measures approximately 2 cm in size. No palpable left axillary adenopathy is present.  Ultrasound is performed, showing an irregularly marginated hypoechoic mass located within the left breast at the 3 o'clock position 7 cm from the nipple corresponding to the  palpable and mammographic finding. This measures 1.8 x 1.2 x 1.0 cm in size and is worrisome for invasive mammary carcinoma.  Ultrasound of the left axilla demonstrates an abnormal appearing level 1 left axillary lymph node with loss of the normal fatty hilum and increased vascularity on color-flow imaging. This measures 2.8 x 1.8 x 1.7 cm in size. There is a second, adjacent, smaller abnormal appearing left axillary lymph node present with cortical thickening. This measures 1.2 x 1.0 x 0.7 cm in size.  I have discussed left breast ultrasound-guided core biopsy and ultrasound-guided core biopsy of the abnormal appearing larger left axillary lymph node with the patient. She would like to proceed with this. This will be performed and reported separately.  IMPRESSION: 1.8 cm suspicious mass located within the left breast at the 3 o'clock position 7 cm from the nipple with abnormal appearing level 1 left axillary lymph nodes. Tissue sampling of the breast mass and larger left axillary lymph node is recommended and will be performed and reported separately.  RECOMMENDATION: Ultrasound-guided core biopsy of the left breast mass at the 3 o'clock position and abnormal appearing left axillary lymph node.  I have discussed the findings and recommendations with the patient. Results were also provided in writing at the conclusion of the visit. If applicable, a reminder letter will be sent to the patient regarding the next appointment.  BI-RADS CATEGORY  5: Highly suggestive of malignancy.   Electronically Signed   By: Luberta Robertson M.D.   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 This was found to be concordant by Dr. Luberta Robertson. Pathology was discussed with the patient by telephone. She  reported doing well after the biopsy. Post biopsy instructions were reviewed and her questions were answered. Surgical consultation has been scheduled with Dr. Alphonsa Overall at [facility] on January 12, 2014. A bilateral breast MRI will be scheduled. She is encouraged to come to [facility] for educational materials. My number was provided  for her for future questions and concerns.  Pathology results reported by Susa Raring RN, BSN on January 11, 2014.   Electronically Signed   By: Luberta Robertson M.D.   On: 01/12/2014 08:56   01/15/2014   CLINICAL DATA:  Left breast mass with abnormal appearing left axillary lymph node.  EXAM: ULTRASOUND GUIDED LEFT BREAST CORE NEEDLE BIOPSY  COMPARISON:  Previous exams.  FINDINGS: I met with the patient and we discussed the procedure of ultrasound-guided biopsy, including benefits and alternatives. We discussed the high likelihood of a successful procedure. We discussed the risks of the procedure, including infection, bleeding, tissue injury, clip migration, and inadequate sampling. Informed written consent was given. The usual time-out protocol was performed immediately prior to the procedure.  Using sterile technique and 2% Lidocaine as local anesthetic, under direct ultrasound visualization, a 14 gauge spring-loaded device was used to perform biopsy of of the mass located within the left breast at the 3 o'clock position using a lateral approach. At the conclusion of the procedure a ribbon shaped tissue marker clip was deployed into the biopsy cavity. Follow up 2 view mammogram was performed and dictated separately.  IMPRESSION: Ultrasound guided biopsy of the left breast mass located at the 3 o'clock position as discussed above. No apparent complications.  Electronically Signed: By: Luberta Robertson M.D. 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 This was found to be concordant by Dr. Luberta Robertson. Pathology was discussed with the patient by telephone. She reported doing well after the biopsy. Post biopsy instructions were reviewed and her questions were answered. Surgical consultation has been scheduled with Dr. Alphonsa Overall at [facility] on January 12, 2014. A bilateral breast MRI will be scheduled. She is encouraged to come to [facility] for educational materials. My number was provided for her for future questions and concerns.  Pathology results reported by Susa Raring RN, BSN on January 11, 2014.   Electronically Signed   By: Luberta Robertson M.D.   On: 01/11/2014 12:57   01/15/2014   CLINICAL DATA:  Left breast mass with abnormal appearing left axillary lymph node.  EXAM: ULTRASOUND GUIDED BIOPSY OF ABNORMAL APPEARING LEFT AXILLARY LYMPH NODE  COMPARISON:  Previous exams.  FINDINGS: I met with the patient and we discussed the procedure of ultrasound-guided biopsy, including benefits and alternatives. We discussed the high likelihood of a successful procedure. We discussed the risks of the procedure, including infection, bleeding, tissue injury, clip migration, and inadequate sampling. Informed written consent was given. The usual time-out protocol was performed immediately prior to the procedure.  Using sterile technique and 2% Lidocaine as local anesthetic, under direct ultrasound visualization, a 14 gauge spring-loaded device was used to perform biopsy of the enlarged abnormal appearing level 1 left axillary lymph node using a lateral approach.  IMPRESSION: Ultrasound guided biopsy of the abnormal appearing left axillary lymph nodes as discussed above. No apparent complications.  Electronically Signed: By: Luberta Robertson M.D. On:  01/10/2014 11:18   Mr Aundra Millet Breast Bx Johnella Moloney Dev 1st Lesion Image Bx Spec Mr Guide  01/30/2014   ADDENDUM REPORT: 01/30/2014 09:42  ADDENDUM: Pathology revealed grade III invasive ductal carcinoma and ductal carcinoma in  situ in both the anterior lower outer quadrant and posterior upper outer quadrant left breast biopsies. This was found to be concordant by Dr. Enrique Sack. Pathology was discussed with the patient by telephone. Post biopsy instructions were reviewed and her questions were answered. 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 She is to follow her treatment plan.  Pathology results reported by Susa Raring RN, BSN on January 30, 2014.   Electronically Signed   By: Enrique Sack M.D.   On: 01/30/2014 09:42   01/30/2014   CLINICAL DATA:  1 cm mass like area of enhancement in the lower outer quadrant of the left breast, anterior to a recently biopsied invasive ductal carcinoma.  EXAM: MRI GUIDED CORE NEEDLE BIOPSY OF THE LEFT BREAST  TECHNIQUE: Multiplanar, multisequence MR imaging of the left breast was performed both before and after administration of intravenous contrast.  CONTRAST:  24m MULTIHANCE GADOBENATE DIMEGLUMINE 529 MG/ML IV SOLN  COMPARISON:  Previous exams.  FINDINGS: I met with the patient, and we discussed the procedure of MRI guided biopsy, including risks, benefits, and alternatives. Specifically, we discussed the risks of infection, bleeding, tissue injury, clip migration, and inadequate sampling. Informed, written consent was given. The usual time out protocol was performed immediately prior to the procedure.  Using sterile technique, 2% Lidocaine, MRI guidance, and a 9 gauge vacuum assisted device, biopsy was performed of the recently demonstrated 1 cm mass like area of enhancement anterior to the recently biopsied invasive ductal carcinoma in the outer left breast using a lateral approach. At the  conclusion of the procedure, an hourglass shaped tissue marker clip was deployed into the biopsy cavity. Follow-up 2-view mammogram was performed and dictated separately.  IMPRESSION: MRI guided biopsy of a 1 cm mass like area of enhancement in anterior aspect of the lower outer quadrant of the left breast. No apparent complications.  Electronically Signed: By: SEnrique SackM.D. On: 01/25/2014 10:36   Mr LAundra MilletBreast Bx W Loc Dev Ea Add Lesion Image Bx Spec Mr Guide  01/30/2014   ADDENDUM REPORT: 01/30/2014 09:42  ADDENDUM: Pathology revealed grade III invasive ductal carcinoma and ductal carcinoma in situ in both the anterior lower outer quadrant and posterior upper outer quadrant left breast biopsies. This was found to be concordant by Dr. SEnrique Sack Pathology was discussed with the patient by telephone. Post biopsy instructions were reviewed and her questions were answered. 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 She is to follow her treatment plan.  Pathology results reported by LSusa RaringRN, BSN on January 30, 2014.   Electronically Signed   By: SEnrique SackM.D.   On: 01/30/2014 09:42   01/30/2014   CLINICAL DATA:  Area of enhancement posterior to a recently diagnosed invasive ductal carcinoma in the outer left breast. This had a linear extension to the skin surface on a recent staging MRI a, suggesting a post biopsy tract.  EXAM: MRI GUIDED CORE NEEDLE BIOPSY OF THE LEFT BREAST  TECHNIQUE: Multiplanar, multisequence MR imaging of the left breast was performed both before and after administration of intravenous contrast.  CONTRAST:  120mMULTIHANCE GADOBENATE DIMEGLUMINE 529 MG/ML IV SOLN  COMPARISON:  Previous exams.  FINDINGS: I met with the patient, and we discussed the procedure of MRI guided biopsy, including risks, benefits, and alternatives. Specifically, we discussed the risks of infection,  bleeding, tissue  injury, clip migration, and inadequate sampling. Informed, written consent was given. The usual time out protocol was performed immediately prior to the procedure.  The pre biopsy images did not demonstrate the previously seen linear extension to the skin surface posterior to the recently biopsied invasive ductal carcinoma. However, there was a 1.6 x 0.8 x 0.6 cm area of clumped nodular enhancement in the upper-outer quadrant of the breast, posterior to the recently biopsied invasive ductal carcinoma, suspicious for ductal carcinoma in situ.  Using sterile technique, 2% Lidocaine, MRI guidance, and a 9 gauge vacuum assisted device, biopsy was performed of the 1.6 cm area of clumped nodular enhancement in the upper-outer quadrant of the left breast, posteriorly, using a lateral approach. At the conclusion of the procedure, a cylinder-shaped tissue marker clip was deployed into the biopsy cavity. Follow-up 2-view mammogram was performed and dictated separately.  IMPRESSION: MRI guided biopsy of a 1.6 cm area of clumped nodular enhancement in the posterior aspect of the upper-outer quadrant of the left breast. No apparent complications.  Electronically Signed: By: Enrique Sack M.D. On: 01/25/2014 10:40      IMPRESSION/PLAN:  Lovely 63 yo woman with T2N1M0 Stage IIB left breast cancer, ?multicentric? with heterogeneous receptors  She has met with medical oncology with plans for neoadjuvant chemotherapy.  I spoke with Dr. Lucia Gaskins of surgery and we agreed that it she would benefit from a re-presentation at tumor board to clarify the status of all the lesions found on imaging and all the biopsies.   She appears to be a potential candidate for the [employer] trial. I will ask our research nurse to speak with her further about this. Patient is interested to enroll. If she ends up having pathologically negative nodes after neoadjuvant chemotherapy, she could be a candidate for the NSABP B. 51 trial. This can be  discussed at a later date, if warranted  It was a pleasure meeting the patient today. We discussed the risks, benefits, and side effects of radiotherapy. We discussed that radiation would take approximately 7 weeks to complete and that chemotherapy will be the first step in her care. This would be followed by surgery. I would give the patient a few weeks to heal following surgery before starting treatment planning. We spoke about acute effects including skin irritation and fatigue as well as much less common late effects including lung and heart irritation. We spoke about the latest technology that is used to minimize the risk of late effects for breast cancer patients undergoing radiotherapy. No guarantees of treatment were given. The patient is enthusiastic about proceeding with treatment. I look forward to participating in the patient's care.   __________________________________________   Eppie Gibson, MD

## 2014-02-01 ENCOUNTER — Telehealth: Payer: Self-pay

## 2014-02-01 NOTE — Telephone Encounter (Signed)
Faxed prior authorization information for zofran to [facility].  Sent to scan.

## 2014-02-07 ENCOUNTER — Telehealth: Payer: Self-pay | Admitting: *Deleted

## 2014-02-07 ENCOUNTER — Other Ambulatory Visit: Payer: Self-pay | Admitting: *Deleted

## 2014-02-07 DIAGNOSIS — C50212 Malignant neoplasm of upper-inner quadrant of left female breast: Secondary | ICD-10-CM

## 2014-02-07 NOTE — Telephone Encounter (Signed)
Patient called with questions related to taking her meds for chemotherapy. Called patient and reviewed with patient how to take her Decadron and Zofran. Patient verbalized understanding.

## 2014-02-08 ENCOUNTER — Other Ambulatory Visit (HOSPITAL_BASED_OUTPATIENT_CLINIC_OR_DEPARTMENT_OTHER): Payer: BC Managed Care – PPO

## 2014-02-08 ENCOUNTER — Ambulatory Visit (HOSPITAL_BASED_OUTPATIENT_CLINIC_OR_DEPARTMENT_OTHER): Payer: BC Managed Care – PPO | Admitting: Hematology and Oncology

## 2014-02-08 ENCOUNTER — Ambulatory Visit: Payer: BC Managed Care – PPO

## 2014-02-08 VITALS — BP 140/76 | HR 72 | Temp 97.6°F | Resp 19 | Ht 63.0 in | Wt 200.9 lb

## 2014-02-08 DIAGNOSIS — Z170 Estrogen receptor positive status [ER+]: Secondary | ICD-10-CM

## 2014-02-08 DIAGNOSIS — C50412 Malignant neoplasm of upper-outer quadrant of left female breast: Secondary | ICD-10-CM

## 2014-02-08 DIAGNOSIS — C50512 Malignant neoplasm of lower-outer quadrant of left female breast: Secondary | ICD-10-CM

## 2014-02-08 DIAGNOSIS — C50212 Malignant neoplasm of upper-inner quadrant of left female breast: Secondary | ICD-10-CM

## 2014-02-08 LAB — CBC WITH DIFFERENTIAL/PLATELET
BASO%: 1 % (ref 0.0–2.0)
Basophils Absolute: 0.1 10*3/uL (ref 0.0–0.1)
EOS%: 1.5 % (ref 0.0–7.0)
Eosinophils Absolute: 0.1 10*3/uL (ref 0.0–0.5)
HEMATOCRIT: 38.7 % (ref 34.8–46.6)
HGB: 12.3 g/dL (ref 11.6–15.9)
LYMPH%: 29.2 % (ref 14.0–49.7)
MCH: 27.1 pg (ref 25.1–34.0)
MCHC: 31.7 g/dL (ref 31.5–36.0)
MCV: 85.5 fL (ref 79.5–101.0)
MONO#: 0.6 10*3/uL (ref 0.1–0.9)
MONO%: 7.9 % (ref 0.0–14.0)
NEUT#: 4.8 10*3/uL (ref 1.5–6.5)
NEUT%: 60.4 % (ref 38.4–76.8)
PLATELETS: 303 10*3/uL (ref 145–400)
RBC: 4.53 10*6/uL (ref 3.70–5.45)
RDW: 13.5 % (ref 11.2–14.5)
WBC: 7.9 10*3/uL (ref 3.9–10.3)
lymph#: 2.3 10*3/uL (ref 0.9–3.3)

## 2014-02-08 LAB — COMPREHENSIVE METABOLIC PANEL (CC13)
ALT: 22 U/L (ref 0–55)
ANION GAP: 8 meq/L (ref 3–11)
AST: 19 U/L (ref 5–34)
Albumin: 3.8 g/dL (ref 3.5–5.0)
Alkaline Phosphatase: 91 U/L (ref 40–150)
BILIRUBIN TOTAL: 0.4 mg/dL (ref 0.20–1.20)
BUN: 9.8 mg/dL (ref 7.0–26.0)
CO2: 30 mEq/L — ABNORMAL HIGH (ref 22–29)
CREATININE: 0.7 mg/dL (ref 0.6–1.1)
Calcium: 10.9 mg/dL — ABNORMAL HIGH (ref 8.4–10.4)
Chloride: 104 mEq/L (ref 98–109)
Glucose: 92 mg/dl (ref 70–140)
Potassium: 3.3 mEq/L — ABNORMAL LOW (ref 3.5–5.1)
Sodium: 142 mEq/L (ref 136–145)
Total Protein: 8.1 g/dL (ref 6.4–8.3)

## 2014-02-08 NOTE — Assessment & Plan Note (Signed)
Left breast invasive ductal carcinoma ER positive HER-2 positive: T2, N1, M0 stage IIB clinical staging 2.8 cm mass with adjacent nodule 1.2 cm  Patient will start neoadjuvant chemotherapy with Taxotere, carboplatin, Herceptin and Perjeta. She has signed a consent form and will start treatment as planned. She'll get Neulasta on day 2 of chemotherapy. All of her questions have been answered. She has received all the medication prescriptions and will stop the Decadron today.  Return to clinic in one week for toxicity check and followup with Mendel Ryder a nurse practitioner.

## 2014-02-08 NOTE — Progress Notes (Signed)
Patient Care Team: Lavera Guise, MD as PCP - General (Internal Medicine)  DIAGNOSIS: Breast cancer of upper-inner quadrant of left female breast Malignant neoplasm of upper-outer quadrant of female breast (Resolved on 02/08/2014)   Primary site: Breast (Left)   Staging method: AJCC 7th Edition   Clinical: Stage IIB (T2, N1, cM0) signed by Eppie Gibson, MD on 01/31/2014 11:11 AM   Pathologic: (cM0)   Summary: Stage IIB (T2, N1, cM0)   SUMMARY OF ONCOLOGIC HISTORY: Oncology History   And     Breast cancer of upper-inner quadrant of left female breast   12/27/2013 Mammogram Mammogram and ultrasound 1.8 cm mass left breast 3:00; 7 cm from nipple with abnormal 1 left axillary lymph node, 2.8 x 1.8 x 1.7 cm, second adjacent smaller nodule 1.2 x 1 x 0.7 cm   01/10/2014 Initial Diagnosis 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        1 AxLN biopsy: ER 70% PR 0% Ki-67 80% HER-2 +2.92, gene copy #5.5    CHIEF COMPLIANT: Patient is here to sign consent for chemotherapy  INTERVAL HISTORY: Regina Deleon is a 63 year old African American lady with above-mentioned history of left-sided breast cancer ER/PR positive and HER-2 positive. She will start neoadjuvant chemotherapy with Taxotere carboplatin Herceptin and Perjeta starting tomorrow. Her port is healing very well. She is here today to sign consent to start chemotherapy tomorrow. She denies any new complaints or concerns other than the fact that she wants to get started on the treatment so that the plan can get started. She is very anxious and could not sleep last night. She apparently had a headache.  REVIEW OF SYSTEMS:   Constitutional: Denies fevers, chills or abnormal weight loss Eyes: Denies blurriness of vision, headache Ears, nose, mouth, throat, and face: Denies mucositis or sore throat Respiratory: Denies cough, dyspnea or wheezes Cardiovascular: Denies palpitation, chest discomfort or lower extremity  swelling Gastrointestinal:  Denies nausea, heartburn or change in bowel habits Skin: Denies abnormal skin rashes Lymphatics: Denies new lymphadenopathy or easy bruising Neurological:Denies numbness, tingling or new weaknesses Behavioral/Psych: Anxiety and stress regarding starting chemotherapy  All other systems were reviewed with the patient and are negative.  I have reviewed the past medical history, past surgical history, social history and family history with the patient and they are unchanged from previous note.  ALLERGIES:  is allergic to darvocet; latex; enalapril; and penicillins.  MEDICATIONS:  Current Outpatient Prescriptions  Medication Sig Dispense Refill  . acetaminophen (TYLENOL) 500 MG tablet Take 500 mg by mouth every 6 (six) hours as needed for mild pain.      Marland Kitchen aspirin 81 MG tablet Take 81 mg by mouth every morning.       . cyclobenzaprine (FLEXERIL) 10 MG tablet       . dexamethasone (DECADRON) 4 MG tablet Take 2 tablets (8 mg total) by mouth 2 (two) times daily. Start the day before Taxotere. Then again the day after chemo for 3 days.  30 tablet  1  . fluticasone (FLONASE) 50 MCG/ACT nasal spray Place 2 sprays into both nostrils daily as needed for allergies or rhinitis.      Marland Kitchen HYDROcodone-acetaminophen (NORCO/VICODIN) 5-325 MG per tablet       . lidocaine-prilocaine (EMLA) cream Apply 1 application topically as needed.  30 g  0  . LORazepam (ATIVAN) 0.5 MG tablet Take 1 tablet (0.5 mg total) by mouth every 6 (six) hours  as needed (Nausea or vomiting).  30 tablet  0  . NIFEdipine (PROCARDIA-XL/ADALAT-CC/NIFEDICAL-XL) 30 MG 24 hr tablet Take 30 mg by mouth 2 (two) times daily.       . Omega-3 Fatty Acids (OMEGA 3 PO) Take 2 tablets by mouth every morning.      . ondansetron (ZOFRAN) 8 MG tablet Take 1 tablet (8 mg total) by mouth 2 (two) times daily. Start the day after chemo for 3 days. Then take as needed for nausea or vomiting.  30 tablet  1  . prochlorperazine  (COMPAZINE) 10 MG tablet Take 1 tablet (10 mg total) by mouth every 6 (six) hours as needed (Nausea or vomiting).  30 tablet  1  . ranitidine (ZANTAC) 150 MG tablet Take 150 mg by mouth 2 (two) times daily.      Marland Kitchen triamterene-hydrochlorothiazide (MAXZIDE-25) 37.5-25 MG per tablet Take 0.5 tablets by mouth every morning. As needed.      . zolpidem (AMBIEN) 10 MG tablet Take 10 mg by mouth at bedtime as needed for sleep.       No current facility-administered medications for this visit.    PHYSICAL EXAMINATION: ECOG PERFORMANCE STATUS: 1 - Symptomatic but completely ambulatory  Filed Vitals:   02/08/14 1149  BP: 140/76  Pulse: 72  Temp: 97.6 F (36.4 C)  Resp: 19   Filed Weights   02/08/14 1149  Weight: 200 lb 14.4 oz (91.128 kg)    GENERAL:alert, no distress and comfortable SKIN: skin color, texture, turgor are normal, no rashes or significant lesions EYES: normal, Conjunctiva are pink and non-injected, sclera clear OROPHARYNX:no exudate, no erythema and lips, buccal mucosa, and tongue normal  NECK: supple, thyroid normal size, non-tender, without nodularity LYMPH:  no palpable lymphadenopathy in the cervical, axillary or inguinal LUNGS: clear to auscultation and percussion with normal breathing effort HEART: regular rate & rhythm and no murmurs and no lower extremity edema ABDOMEN:abdomen soft, non-tender and normal bowel sounds Musculoskeletal:no cyanosis of digits and no clubbing  NEURO: alert & oriented x 3 with fluent speech, no focal motor/sensory deficits  LABORATORY DATA:  I have reviewed the data as listed   Chemistry      Component Value Date/Time   NA 142 02/08/2014 1135   K 3.3* 02/08/2014 1135   CO2 30* 02/08/2014 1135   BUN 9.8 02/08/2014 1135   CREATININE 0.7 02/08/2014 1135      Component Value Date/Time   CALCIUM 10.9* 02/08/2014 1135   ALKPHOS 91 02/08/2014 1135   AST 19 02/08/2014 1135   ALT 22 02/08/2014 1135   BILITOT 0.40 02/08/2014 1135       Lab  Results  Component Value Date   WBC 7.9 02/08/2014   HGB 12.3 02/08/2014   HCT 38.7 02/08/2014   MCV 85.5 02/08/2014   PLT 303 02/08/2014   NEUTROABS 4.8 02/08/2014     RADIOGRAPHIC STUDIES: I have personally reviewed the radiology reports and agreed with their findings. No results found.   ASSESSMENT & PLAN:  Breast cancer of upper-inner quadrant of left female breast Left breast invasive ductal carcinoma ER positive HER-2 positive: T2, N1, M0 stage IIB clinical staging 2.8 cm mass with adjacent nodule 1.2 cm  Patient will start neoadjuvant chemotherapy with Taxotere, carboplatin, Herceptin and Perjeta. She has signed a consent form and will start treatment as planned. She'll get Neulasta on day 2 of chemotherapy. All of her questions have been answered. She has received all the medication prescriptions and will stop the  Decadron today.  Return to clinic in one week for toxicity check and followup with Mendel Ryder a nurse practitioner.   Orders Placed This Encounter  Procedures  . CBC with Differential    Standing Status: Future     Number of Occurrences:      Standing Expiration Date: 02/08/2015  . Comprehensive metabolic panel (Cmet) - [facility]    Standing Status: Future     Number of Occurrences:      Standing Expiration Date: 02/08/2015   The patient has a good understanding of the overall plan. she agrees with it. She will call with any problems that may develop before her next visit here.  I spent 15 minutes counseling the patient face to face. The total time spent in the appointment was 20 minutes and more than 50% was on counseling and review of test results    Rulon Eisenmenger, MD 02/08/2014 12:26 PM

## 2014-02-09 ENCOUNTER — Other Ambulatory Visit: Payer: Self-pay | Admitting: Hematology and Oncology

## 2014-02-09 ENCOUNTER — Telehealth: Payer: Self-pay | Admitting: Hematology and Oncology

## 2014-02-09 ENCOUNTER — Ambulatory Visit (HOSPITAL_BASED_OUTPATIENT_CLINIC_OR_DEPARTMENT_OTHER): Payer: BC Managed Care – PPO

## 2014-02-09 VITALS — BP 159/88 | HR 92 | Temp 98.3°F | Resp 28

## 2014-02-09 DIAGNOSIS — Z5112 Encounter for antineoplastic immunotherapy: Secondary | ICD-10-CM

## 2014-02-09 DIAGNOSIS — C50512 Malignant neoplasm of lower-outer quadrant of left female breast: Secondary | ICD-10-CM

## 2014-02-09 DIAGNOSIS — Z9884 Bariatric surgery status: Secondary | ICD-10-CM

## 2014-02-09 DIAGNOSIS — Z5111 Encounter for antineoplastic chemotherapy: Secondary | ICD-10-CM

## 2014-02-09 DIAGNOSIS — C50412 Malignant neoplasm of upper-outer quadrant of left female breast: Secondary | ICD-10-CM

## 2014-02-09 DIAGNOSIS — C50212 Malignant neoplasm of upper-inner quadrant of left female breast: Secondary | ICD-10-CM

## 2014-02-09 MED ORDER — DOCETAXEL CHEMO INJECTION 160 MG/16ML
75.0000 mg/m2 | Freq: Once | INTRAVENOUS | Status: AC
  Administered 2014-02-09: 150 mg via INTRAVENOUS
  Filled 2014-02-09: qty 15

## 2014-02-09 MED ORDER — DEXAMETHASONE SODIUM PHOSPHATE 20 MG/5ML IJ SOLN
INTRAMUSCULAR | Status: AC
  Filled 2014-02-09: qty 5

## 2014-02-09 MED ORDER — SODIUM CHLORIDE 0.9 % IV SOLN
700.0000 mg | Freq: Once | INTRAVENOUS | Status: AC
  Administered 2014-02-09: 700 mg via INTRAVENOUS
  Filled 2014-02-09: qty 70

## 2014-02-09 MED ORDER — ACETAMINOPHEN 325 MG PO TABS
ORAL_TABLET | ORAL | Status: AC
  Filled 2014-02-09: qty 2

## 2014-02-09 MED ORDER — DIPHENHYDRAMINE HCL 25 MG PO CAPS
ORAL_CAPSULE | ORAL | Status: AC
  Filled 2014-02-09: qty 2

## 2014-02-09 MED ORDER — PERTUZUMAB CHEMO INJECTION 420 MG/14ML
840.0000 mg | Freq: Once | INTRAVENOUS | Status: AC
  Administered 2014-02-09: 840 mg via INTRAVENOUS
  Filled 2014-02-09: qty 28

## 2014-02-09 MED ORDER — DEXAMETHASONE SODIUM PHOSPHATE 20 MG/5ML IJ SOLN
20.0000 mg | Freq: Once | INTRAMUSCULAR | Status: AC
  Administered 2014-02-09: 20 mg via INTRAVENOUS

## 2014-02-09 MED ORDER — ACETAMINOPHEN 325 MG PO TABS
650.0000 mg | ORAL_TABLET | Freq: Once | ORAL | Status: AC
  Administered 2014-02-09: 650 mg via ORAL

## 2014-02-09 MED ORDER — HEPARIN SOD (PORK) LOCK FLUSH 100 UNIT/ML IV SOLN
500.0000 [IU] | Freq: Once | INTRAVENOUS | Status: AC | PRN
  Administered 2014-02-09: 500 [IU]
  Filled 2014-02-09: qty 5

## 2014-02-09 MED ORDER — SODIUM CHLORIDE 0.9 % IV SOLN
Freq: Once | INTRAVENOUS | Status: AC
  Administered 2014-02-09: 09:00:00 via INTRAVENOUS

## 2014-02-09 MED ORDER — DIPHENHYDRAMINE HCL 25 MG PO CAPS
50.0000 mg | ORAL_CAPSULE | Freq: Once | ORAL | Status: AC
  Administered 2014-02-09: 50 mg via ORAL

## 2014-02-09 MED ORDER — ONDANSETRON 16 MG/50ML IVPB (CHCC)
INTRAVENOUS | Status: AC
  Filled 2014-02-09: qty 16

## 2014-02-09 MED ORDER — TRASTUZUMAB CHEMO INJECTION 440 MG
8.0000 mg/kg | Freq: Once | INTRAVENOUS | Status: AC
  Administered 2014-02-09: 735 mg via INTRAVENOUS
  Filled 2014-02-09: qty 35

## 2014-02-09 MED ORDER — ONDANSETRON 16 MG/50ML IVPB (CHCC)
16.0000 mg | Freq: Once | INTRAVENOUS | Status: AC
  Administered 2014-02-09: 16 mg via INTRAVENOUS

## 2014-02-09 MED ORDER — SODIUM CHLORIDE 0.9 % IJ SOLN
10.0000 mL | INTRAMUSCULAR | Status: DC | PRN
  Administered 2014-02-09: 10 mL
  Filled 2014-02-09: qty 10

## 2014-02-09 NOTE — Telephone Encounter (Signed)
, °

## 2014-02-09 NOTE — Progress Notes (Unsigned)
Patient states she is not allergic to tylenol. After first 15 minutes of taxotere, did not increase rate.  Patient's respirations up to 32 (previously 24).  Pulse rate remains elevated at 102 to 105. Patient denies any SOB or other sx Pt. Took her NIFEdipine this am but did not take her maxzide because she did not want to be up to the restroom frequently.    Discussed with Williemae Area NP and she came and assessed patient and determined that it was ok to continue with taxotere and regular increases.

## 2014-02-09 NOTE — Patient Instructions (Signed)
[facility] Discharge Instructions for Patients Receiving Chemotherapy  Today you received the following chemotherapy agents herceptin,perjeta, taxotere and carboplatin.  To help prevent nausea and vomiting after your treatment, we encourage you to take your nausea medication zofran as directed and then compazine as needed.   If you develop nausea and vomiting that is not controlled by your nausea medication, call the clinic.   BELOW ARE SYMPTOMS THAT SHOULD BE REPORTED IMMEDIATELY:  *FEVER GREATER THAN 100.5 F  *CHILLS WITH OR WITHOUT FEVER  NAUSEA AND VOMITING THAT IS NOT CONTROLLED WITH YOUR NAUSEA MEDICATION  *UNUSUAL SHORTNESS OF BREATH  *UNUSUAL BRUISING OR BLEEDING  TENDERNESS IN MOUTH AND THROAT WITH OR WITHOUT PRESENCE OF ULCERS  *URINARY PROBLEMS  *BOWEL PROBLEMS  UNUSUAL RASH Items with * indicate a potential emergency and should be followed up as soon as possible.  Feel free to call the clinic you have any questions or concerns. The clinic phone number is [phone].

## 2014-02-10 ENCOUNTER — Ambulatory Visit (HOSPITAL_BASED_OUTPATIENT_CLINIC_OR_DEPARTMENT_OTHER): Payer: BC Managed Care – PPO

## 2014-02-10 VITALS — BP 149/96 | HR 85 | Temp 97.9°F | Resp 20

## 2014-02-10 DIAGNOSIS — C50412 Malignant neoplasm of upper-outer quadrant of left female breast: Secondary | ICD-10-CM

## 2014-02-10 DIAGNOSIS — Z9884 Bariatric surgery status: Secondary | ICD-10-CM

## 2014-02-10 DIAGNOSIS — C50212 Malignant neoplasm of upper-inner quadrant of left female breast: Secondary | ICD-10-CM

## 2014-02-10 DIAGNOSIS — C50512 Malignant neoplasm of lower-outer quadrant of left female breast: Secondary | ICD-10-CM

## 2014-02-10 DIAGNOSIS — Z5189 Encounter for other specified aftercare: Secondary | ICD-10-CM

## 2014-02-10 MED ORDER — PEGFILGRASTIM INJECTION 6 MG/0.6ML
6.0000 mg | Freq: Once | SUBCUTANEOUS | Status: AC
  Administered 2014-02-10: 6 mg via SUBCUTANEOUS

## 2014-02-10 NOTE — Patient Instructions (Signed)
Pegfilgrastim injection What is this medicine? PEGFILGRASTIM (peg fil GRA stim) is a long-acting granulocyte colony-stimulating factor that stimulates the growth of neutrophils, a type of white blood cell important in the body's fight against infection. It is used to reduce the incidence of fever and infection in patients with certain types of cancer who are receiving chemotherapy that affects the bone marrow. This medicine may be used for other purposes; ask your health care provider or pharmacist if you have questions. COMMON BRAND NAME(S): Neulasta What should I tell my health care provider before I take this medicine? They need to know if you have any of these conditions: -latex allergy -ongoing radiation therapy -sickle cell disease -skin reactions to acrylic adhesives (On-Body Injector only) -an unusual or allergic reaction to pegfilgrastim, filgrastim, other medicines, foods, dyes, or preservatives -pregnant or trying to get pregnant -breast-feeding How should I use this medicine? This medicine is for injection under the skin. If you get this medicine at home, you will be taught how to prepare and give the pre-filled syringe or how to use the On-body Injector. Refer to the patient Instructions for Use for detailed instructions. Use exactly as directed. Take your medicine at regular intervals. Do not take your medicine more often than directed. It is important that you put your used needles and syringes in a special sharps container. Do not put them in a trash can. If you do not have a sharps container, call your pharmacist or healthcare provider to get one. Talk to your pediatrician regarding the use of this medicine in children. Special care may be needed. Overdosage: If you think you have taken too much of this medicine contact a poison control center or emergency room at once. NOTE: This medicine is only for you. Do not share this medicine with others. What if I miss a dose? It is  important not to miss your dose. Call your doctor or health care professional if you miss your dose. If you miss a dose due to an On-body Injector failure or leakage, a new dose should be administered as soon as possible using a single prefilled syringe for manual use. What may interact with this medicine? Interactions have not been studied. Give your health care provider a list of all the medicines, herbs, non-prescription drugs, or dietary supplements you use. Also tell them if you smoke, drink alcohol, or use illegal drugs. Some items may interact with your medicine. This list may not describe all possible interactions. Give your health care provider a list of all the medicines, herbs, non-prescription drugs, or dietary supplements you use. Also tell them if you smoke, drink alcohol, or use illegal drugs. Some items may interact with your medicine. What should I watch for while using this medicine? You may need blood work done while you are taking this medicine. If you are going to need a MRI, CT scan, or other procedure, tell your doctor that you are using this medicine (On-Body Injector only). What side effects may I notice from receiving this medicine? Side effects that you should report to your doctor or health care professional as soon as possible: -allergic reactions like skin rash, itching or hives, swelling of the face, lips, or tongue -dizziness -fever -pain, redness, or irritation at site where injected -pinpoint red spots on the skin -shortness of breath or breathing problems -stomach or side pain, or pain at the shoulder -swelling -tiredness -trouble passing urine Side effects that usually do not require medical attention (report to your doctor   or health care professional if they continue or are bothersome): -bone pain -muscle pain This list may not describe all possible side effects. Call your doctor for medical advice about side effects. You may report side effects to FDA at  1-800-FDA-1088. Where should I keep my medicine? Keep out of the reach of children. Store pre-filled syringes in a refrigerator between 2 and 8 degrees C (36 and 46 degrees F). Do not freeze. Keep in carton to protect from light. Throw away this medicine if it is left out of the refrigerator for more than 48 hours. Throw away any unused medicine after the expiration date. NOTE: This sheet is a summary. It may not cover all possible information. If you have questions about this medicine, talk to your doctor, pharmacist, or health care provider.  2015, [employer]vier/[employer] Standard. (2013-07-20 16:14:05)  

## 2014-02-12 ENCOUNTER — Telehealth: Payer: Self-pay | Admitting: *Deleted

## 2014-02-12 NOTE — Telephone Encounter (Signed)
Called Regina Deleon for chemotherapy F/U.  Patient is doing well.  Reports feeling "tired, no energy and resting today".  Denies n/v.  Denies any new side effects or symptoms.  Bowel and bladder is functioning well.  Eating and drinking well and I instructed to drink 64 oz minimum daily or at least the day before, of and after treatment.  Denies questions at this time and encouraged to call if needed.  Reviewed how to call after hours in the case of an emergency.

## 2014-02-12 NOTE — Telephone Encounter (Signed)
Message copied by Cherylynn Ridges on Mon Feb 12, 2014  5:45 PM ------      Message from: Ignacia Felling      Created: Fri Feb 09, 2014 10:57 AM      Regarding: chemo follow up call      Contact: [place].   1st herceptin, perjeta, taxotere and carbo.   [phone] ------

## 2014-02-15 ENCOUNTER — Encounter: Payer: Self-pay | Admitting: *Deleted

## 2014-02-16 ENCOUNTER — Ambulatory Visit (HOSPITAL_BASED_OUTPATIENT_CLINIC_OR_DEPARTMENT_OTHER): Payer: BC Managed Care – PPO | Admitting: Oncology

## 2014-02-16 ENCOUNTER — Encounter: Payer: Self-pay | Admitting: Oncology

## 2014-02-16 ENCOUNTER — Other Ambulatory Visit (HOSPITAL_BASED_OUTPATIENT_CLINIC_OR_DEPARTMENT_OTHER): Payer: BC Managed Care – PPO

## 2014-02-16 ENCOUNTER — Telehealth: Payer: Self-pay | Admitting: Hematology and Oncology

## 2014-02-16 VITALS — BP 132/71 | HR 92 | Temp 97.9°F | Resp 18 | Ht 63.0 in | Wt 200.7 lb

## 2014-02-16 DIAGNOSIS — C50212 Malignant neoplasm of upper-inner quadrant of left female breast: Secondary | ICD-10-CM

## 2014-02-16 DIAGNOSIS — C50512 Malignant neoplasm of lower-outer quadrant of left female breast: Secondary | ICD-10-CM

## 2014-02-16 DIAGNOSIS — K5900 Constipation, unspecified: Secondary | ICD-10-CM

## 2014-02-16 DIAGNOSIS — C50412 Malignant neoplasm of upper-outer quadrant of left female breast: Secondary | ICD-10-CM

## 2014-02-16 LAB — COMPREHENSIVE METABOLIC PANEL (CC13)
ALT: 33 U/L (ref 0–55)
ANION GAP: 11 meq/L (ref 3–11)
AST: 22 U/L (ref 5–34)
Albumin: 3.6 g/dL (ref 3.5–5.0)
Alkaline Phosphatase: 104 U/L (ref 40–150)
BUN: 12.6 mg/dL (ref 7.0–26.0)
CO2: 27 mEq/L (ref 22–29)
CREATININE: 0.8 mg/dL (ref 0.6–1.1)
Calcium: 10.9 mg/dL — ABNORMAL HIGH (ref 8.4–10.4)
Chloride: 103 mEq/L (ref 98–109)
Glucose: 106 mg/dl (ref 70–140)
Potassium: 3.4 mEq/L — ABNORMAL LOW (ref 3.5–5.1)
Sodium: 140 mEq/L (ref 136–145)
Total Bilirubin: 0.25 mg/dL (ref 0.20–1.20)
Total Protein: 7.1 g/dL (ref 6.4–8.3)

## 2014-02-16 LAB — CBC WITH DIFFERENTIAL/PLATELET
BASO%: 0.4 % (ref 0.0–2.0)
BASOS ABS: 0.1 10*3/uL (ref 0.0–0.1)
EOS ABS: 0 10*3/uL (ref 0.0–0.5)
EOS%: 0.1 % (ref 0.0–7.0)
HEMATOCRIT: 39.5 % (ref 34.8–46.6)
HEMOGLOBIN: 12.4 g/dL (ref 11.6–15.9)
LYMPH#: 2.5 10*3/uL (ref 0.9–3.3)
LYMPH%: 17.1 % (ref 14.0–49.7)
MCH: 26.8 pg (ref 25.1–34.0)
MCHC: 31.5 g/dL (ref 31.5–36.0)
MCV: 85 fL (ref 79.5–101.0)
MONO#: 1.9 10*3/uL — ABNORMAL HIGH (ref 0.1–0.9)
MONO%: 13.5 % (ref 0.0–14.0)
NEUT%: 68.9 % (ref 38.4–76.8)
NEUTROS ABS: 9.9 10*3/uL — AB (ref 1.5–6.5)
Platelets: 190 10*3/uL (ref 145–400)
RBC: 4.65 10*6/uL (ref 3.70–5.45)
RDW: 13.6 % (ref 11.2–14.5)
WBC: 14.4 10*3/uL — ABNORMAL HIGH (ref 3.9–10.3)

## 2014-02-16 NOTE — Telephone Encounter (Signed)
gv adn printed appt sched and avs for pt for OCT and NOV....sed added tx. °

## 2014-02-16 NOTE — Assessment & Plan Note (Signed)
I have given the patient recommendations for management of her constipation. She may use a stool softener such as Colace twice a day along with MiraLax one capsule mixed and 8 ounces of water daily. She will discontinue if her stools become loose.

## 2014-02-16 NOTE — Progress Notes (Signed)
Patient Care Team: Lavera Guise, MD as PCP - General (Internal Medicine)  DIAGNOSIS: Breast cancer of upper-inner quadrant of left female breast Malignant neoplasm of upper-outer quadrant of female breast (Resolved on 02/08/2014)   Primary site: Breast (Left)   Staging method: AJCC 7th Edition   Clinical: Stage IIB (T2, N1, cM0) signed by Eppie Gibson, MD on 01/31/2014 11:11 AM   Pathologic: (cM0)   Summary: Stage IIB (T2, N1, cM0)   SUMMARY OF ONCOLOGIC HISTORY: Oncology History   And     Breast cancer of upper-inner quadrant of left female breast   12/27/2013 Mammogram Mammogram and ultrasound 1.8 cm mass left breast 3:00; 7 cm from nipple with abnormal 1 left axillary lymph node, 2.8 x 1.8 x 1.7 cm, second adjacent smaller nodule 1.2 x 1 x 0.7 cm   01/10/2014 Initial Diagnosis 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        1 AxLN biopsy: ER 70% PR 0% Ki-67 80% HER-2 +2.92, gene copy #5.5    CHIEF COMPLIANT: Followup for chemotherapy  INTERVAL HISTORY: Regina Deleon is a 63 year old African American lady with above-mentioned history of left-sided breast cancer ER/PR positive and HER-2 positive. Started neoadjuvant chemotherapy with Taxotere carboplatin Herceptin and Perjeta 02/09/2014. She is having more fatigue. Reports arthralgias following her Neulasta injection. She is taking Tylenol for this which helps some. She took Norco on one occasion. The patient had been taking Claritin daily, but stopped taking this yesterday. Reports a metallic taste in her mouth, but no sores. She also reports constipation. Denies nausea or vomiting.   REVIEW OF SYSTEMS:   Constitutional: Denies fevers, chills or abnormal weight loss. Positive for fatigue. Eyes: Denies blurriness of vision, headache Ears, nose, mouth, throat, and face: Denies mucositis or sore throat Respiratory: Denies cough, dyspnea or wheezes Cardiovascular: Denies palpitation, chest discomfort or  lower extremity swelling Gastrointestinal:  Denies nausea, heartburn. Positive for constipation. Positive for metallic taste in her mouth. Skin: Denies abnormal skin rashes Lymphatics: Denies new lymphadenopathy or easy bruising Neurological:Denies numbness, tingling or new weaknesses Behavioral/Psych: Anxiety and stress regarding recently starting chemotherapy  All other systems were reviewed with the patient and are negative.  I have reviewed the past medical history, past surgical history, social history and family history with the patient and they are unchanged from previous note.  ALLERGIES:  is allergic to darvocet; latex; enalapril; and penicillins.  MEDICATIONS:  Current Outpatient Prescriptions  Medication Sig Dispense Refill  . acetaminophen (TYLENOL) 500 MG tablet Take 500 mg by mouth every 6 (six) hours as needed for mild pain.      Marland Kitchen aspirin 81 MG tablet Take 81 mg by mouth every morning.       Marland Kitchen dexamethasone (DECADRON) 4 MG tablet Take 2 tablets (8 mg total) by mouth 2 (two) times daily. Start the day before Taxotere. Then again the day after chemo for 3 days.  30 tablet  1  . HYDROcodone-acetaminophen (NORCO/VICODIN) 5-325 MG per tablet       . lidocaine-prilocaine (EMLA) cream Apply 1 application topically as needed.  30 g  0  . NIFEdipine (PROCARDIA-XL/ADALAT-CC/NIFEDICAL-XL) 30 MG 24 hr tablet Take 30 mg by mouth 2 (two) times daily.       . Omega-3 Fatty Acids (OMEGA 3 PO) Take 2 tablets by mouth every morning.      . ranitidine (ZANTAC) 150 MG tablet Take 150 mg by mouth 2 (two) times  daily.      . triamterene-hydrochlorothiazide (MAXZIDE-25) 37.5-25 MG per tablet Take 0.5 tablets by mouth every morning. As needed.      . zolpidem (AMBIEN) 10 MG tablet Take 10 mg by mouth at bedtime as needed for sleep.      . cyclobenzaprine (FLEXERIL) 10 MG tablet       . fluticasone (FLONASE) 50 MCG/ACT nasal spray Place 2 sprays into both nostrils daily as needed for allergies or  rhinitis.      Marland Kitchen LORazepam (ATIVAN) 0.5 MG tablet Take 1 tablet (0.5 mg total) by mouth every 6 (six) hours as needed (Nausea or vomiting).  30 tablet  0  . ondansetron (ZOFRAN) 8 MG tablet Take 1 tablet (8 mg total) by mouth 2 (two) times daily. Start the day after chemo for 3 days. Then take as needed for nausea or vomiting.  30 tablet  1  . prochlorperazine (COMPAZINE) 10 MG tablet Take 1 tablet (10 mg total) by mouth every 6 (six) hours as needed (Nausea or vomiting).  30 tablet  1   No current facility-administered medications for this visit.   Facility-Administered Medications Ordered in Other Visits  Medication Dose Route Frequency Provider Last Rate Last Dose  . sodium chloride 0.9 % injection 10 mL  10 mL Intracatheter PRN Rulon Eisenmenger, MD   10 mL at 02/09/14 1720    PHYSICAL EXAMINATION: ECOG PERFORMANCE STATUS: 1 - Symptomatic but completely ambulatory  Filed Vitals:   02/16/14 1156  BP: 132/71  Pulse: 92  Temp: 97.9 F (36.6 C)  Resp: 18   Filed Weights   02/16/14 1156  Weight: 200 lb 11.2 oz (91.037 kg)    GENERAL:alert, no distress and comfortable SKIN: skin color, texture, turgor are normal, no rashes or significant lesions EYES: normal, Conjunctiva are pink and non-injected, sclera clear OROPHARYNX:no exudate, no erythema and lips, buccal mucosa, and tongue normal  NECK: supple, thyroid normal size, non-tender, without nodularity LYMPH:  no palpable lymphadenopathy in the cervical, axillary or inguinal LUNGS: clear to auscultation and percussion with normal breathing effort HEART: regular rate & rhythm and no murmurs and no lower extremity edema ABDOMEN:abdomen soft, non-tender and normal bowel sounds Musculoskeletal:no cyanosis of digits and no clubbing  NEURO: alert & oriented x 3 with fluent speech, no focal motor/sensory deficits  LABORATORY DATA:  I have reviewed the data as listed   Chemistry      Component Value Date/Time   NA 140 02/16/2014 1143    K 3.4* 02/16/2014 1143   CO2 27 02/16/2014 1143   BUN 12.6 02/16/2014 1143   CREATININE 0.8 02/16/2014 1143      Component Value Date/Time   CALCIUM 10.9* 02/16/2014 1143   ALKPHOS 104 02/16/2014 1143   AST 22 02/16/2014 1143   ALT 33 02/16/2014 1143   BILITOT 0.25 02/16/2014 1143       Lab Results  Component Value Date   WBC 14.4* 02/16/2014   HGB 12.4 02/16/2014   HCT 39.5 02/16/2014   MCV 85.0 02/16/2014   PLT 190 02/16/2014   NEUTROABS 9.9* 02/16/2014     RADIOGRAPHIC STUDIES: I have personally reviewed the radiology reports and agreed with their findings. No results found.   ASSESSMENT & PLAN:  Breast cancer of upper-inner quadrant of left female breast ER positive HER-2 positive: T2, N1, M0 stage IIB clinical staging 2.8 cm mass with adjacent nodule 1.2 cm   Receiving neoadjuvant chemotherapy with Taxotere, carboplatin, Herceptin and Perjeta. S/P 1  cycle. Tolerated well overall with the exception of fatigue, arthralgias due to Neulasta, taste changes and constipation. We have discussed the fatigue is an expected side effect of chemotherapy. She was encouraged to rest over the weekend. We have discussed the use of Tylenol and Claritin for her arthralgias related to the Neulasta injection. I have encouraged her to rinse her mouth with unsweetened tea to minimize the metallic taste in her mouth.  Labs reviewed in detail with her today. Her CBC is normal with the exception of a slightly elevated white count. This is likely due to her recent Neulasta.  Return to clinic in 2 weeks for her second cycle of chemotherapy.  Constipation I have given the patient recommendations for management of her constipation. She may use a stool softener such as Colace twice a day along with MiraLax one capsule mixed and 8 ounces of water daily. She will discontinue if her stools become loose.    Orders Placed This Encounter  Procedures  . CBC with Differential    Standing Status:  Future     Number of Occurrences:      Standing Expiration Date: 02/16/2015  . Comprehensive metabolic panel    Standing Status: Future     Number of Occurrences:      Standing Expiration Date: 02/16/2015   The patient has a good understanding of the overall plan. she agrees with it. She will call with any problems that may develop before her next visit here.  I spent 20 minutes counseling the patient face to face. The total time spent in the appointment was 30 minutes and more than 50% was on counseling and review of test results    Mikey Bussing, NP 02/16/2014 2:13 PM

## 2014-02-16 NOTE — Assessment & Plan Note (Signed)
ER positive HER-2 positive: T2, N1, M0 stage IIB clinical staging 2.8 cm mass with adjacent nodule 1.2 cm   Receiving neoadjuvant chemotherapy with Taxotere, carboplatin, Herceptin and Perjeta. S/P 1 cycle. Tolerated well overall with the exception of fatigue, arthralgias due to Neulasta, taste changes and constipation. We have discussed the fatigue is an expected side effect of chemotherapy. She was encouraged to rest over the weekend. We have discussed the use of Tylenol and Claritin for her arthralgias related to the Neulasta injection. I have encouraged her to rinse her mouth with unsweetened tea to minimize the metallic taste in her mouth.  Labs reviewed in detail with her today. Her CBC is normal with the exception of a slightly elevated white count. This is likely due to her recent Neulasta.  Return to clinic in 2 weeks for her second cycle of chemotherapy.

## 2014-02-16 NOTE — Patient Instructions (Addendum)
Try to rinse mouth with unsweetened tea prior to eating to minimize metallic taste in the mouth.  For constipation, use Colace (docusate sodium) 100 mg twice a day with MiraLax 1 capful mixed in 8 oz. of water daily.

## 2014-02-22 ENCOUNTER — Ambulatory Visit: Payer: BC Managed Care – PPO

## 2014-02-26 ENCOUNTER — Telehealth: Payer: Self-pay | Admitting: *Deleted

## 2014-02-26 NOTE — Telephone Encounter (Addendum)
Received vm from pt stating that she has some pain & swelling on both sides of her face.  She states that she felt like something snapped in her jaw joints.  She reports noticing pain on Sat & swelling on Sun in both jaw bone joints. Returned call & she denies breathing or swallowing difficulty.  She denies any bisphosphonate.  Suggested that she call her dentist.  Conferred with Susanne Borders NP.  Pt informed to call if symptoms worsen but suggested she take some ativan tonight which might relax her just in case she is stressed/anxious & grinding or clamping teeth at night & not realizing it.  Pt expressed understanding.

## 2014-03-02 ENCOUNTER — Ambulatory Visit (HOSPITAL_BASED_OUTPATIENT_CLINIC_OR_DEPARTMENT_OTHER): Payer: BC Managed Care – PPO

## 2014-03-02 ENCOUNTER — Other Ambulatory Visit: Payer: Self-pay | Admitting: Physician Assistant

## 2014-03-02 ENCOUNTER — Other Ambulatory Visit (HOSPITAL_BASED_OUTPATIENT_CLINIC_OR_DEPARTMENT_OTHER): Payer: BC Managed Care – PPO

## 2014-03-02 ENCOUNTER — Other Ambulatory Visit: Payer: Self-pay | Admitting: Hematology and Oncology

## 2014-03-02 ENCOUNTER — Encounter: Payer: Self-pay | Admitting: Physician Assistant

## 2014-03-02 ENCOUNTER — Encounter: Payer: Self-pay | Admitting: *Deleted

## 2014-03-02 ENCOUNTER — Ambulatory Visit (HOSPITAL_BASED_OUTPATIENT_CLINIC_OR_DEPARTMENT_OTHER): Payer: BC Managed Care – PPO | Admitting: Physician Assistant

## 2014-03-02 VITALS — BP 106/84 | HR 97 | Temp 98.3°F | Resp 18 | Ht 63.0 in | Wt 201.3 lb

## 2014-03-02 DIAGNOSIS — C50212 Malignant neoplasm of upper-inner quadrant of left female breast: Secondary | ICD-10-CM

## 2014-03-02 DIAGNOSIS — Z170 Estrogen receptor positive status [ER+]: Secondary | ICD-10-CM

## 2014-03-02 DIAGNOSIS — Z5112 Encounter for antineoplastic immunotherapy: Secondary | ICD-10-CM

## 2014-03-02 LAB — COMPREHENSIVE METABOLIC PANEL (CC13)
ALBUMIN: 3.9 g/dL (ref 3.5–5.0)
ALT: 27 U/L (ref 0–55)
AST: 16 U/L (ref 5–34)
Alkaline Phosphatase: 86 U/L (ref 40–150)
Anion Gap: 10 mEq/L (ref 3–11)
BUN: 15 mg/dL (ref 7.0–26.0)
CO2: 24 meq/L (ref 22–29)
Calcium: 10.6 mg/dL — ABNORMAL HIGH (ref 8.4–10.4)
Chloride: 107 mEq/L (ref 98–109)
Creatinine: 0.8 mg/dL (ref 0.6–1.1)
Glucose: 97 mg/dl (ref 70–140)
POTASSIUM: 3.7 meq/L (ref 3.5–5.1)
SODIUM: 141 meq/L (ref 136–145)
TOTAL PROTEIN: 7.4 g/dL (ref 6.4–8.3)
Total Bilirubin: 0.31 mg/dL (ref 0.20–1.20)

## 2014-03-02 LAB — CBC WITH DIFFERENTIAL/PLATELET
BASO%: 0.1 % (ref 0.0–2.0)
BASOS ABS: 0 10*3/uL (ref 0.0–0.1)
EOS ABS: 0 10*3/uL (ref 0.0–0.5)
EOS%: 0 % (ref 0.0–7.0)
HCT: 34.6 % — ABNORMAL LOW (ref 34.8–46.6)
HEMOGLOBIN: 10.9 g/dL — AB (ref 11.6–15.9)
LYMPH%: 11.2 % — ABNORMAL LOW (ref 14.0–49.7)
MCH: 26.6 pg (ref 25.1–34.0)
MCHC: 31.4 g/dL — ABNORMAL LOW (ref 31.5–36.0)
MCV: 84.5 fL (ref 79.5–101.0)
MONO#: 1.4 10*3/uL — ABNORMAL HIGH (ref 0.1–0.9)
MONO%: 8.6 % (ref 0.0–14.0)
NEUT#: 13.4 10*3/uL — ABNORMAL HIGH (ref 1.5–6.5)
NEUT%: 80.1 % — ABNORMAL HIGH (ref 38.4–76.8)
Platelets: 238 10*3/uL (ref 145–400)
RBC: 4.1 10*6/uL (ref 3.70–5.45)
RDW: 13.8 % (ref 11.2–14.5)
WBC: 16.7 10*3/uL — ABNORMAL HIGH (ref 3.9–10.3)
lymph#: 1.9 10*3/uL (ref 0.9–3.3)

## 2014-03-02 MED ORDER — DIPHENHYDRAMINE HCL 25 MG PO CAPS
50.0000 mg | ORAL_CAPSULE | Freq: Once | ORAL | Status: AC
  Administered 2014-03-02: 50 mg via ORAL

## 2014-03-02 MED ORDER — ACETAMINOPHEN 325 MG PO TABS
650.0000 mg | ORAL_TABLET | Freq: Once | ORAL | Status: AC
  Administered 2014-03-02: 650 mg via ORAL

## 2014-03-02 MED ORDER — SODIUM CHLORIDE 0.9 % IV SOLN
6.0000 mg/kg | Freq: Once | INTRAVENOUS | Status: AC
  Administered 2014-03-02: 546 mg via INTRAVENOUS
  Filled 2014-03-02: qty 26

## 2014-03-02 MED ORDER — DEXTROSE 5 % IV SOLN
75.0000 mg/m2 | Freq: Once | INTRAVENOUS | Status: AC
  Administered 2014-03-02: 150 mg via INTRAVENOUS
  Filled 2014-03-02: qty 15

## 2014-03-02 MED ORDER — DIPHENHYDRAMINE HCL 25 MG PO CAPS
ORAL_CAPSULE | ORAL | Status: AC
  Filled 2014-03-02: qty 2

## 2014-03-02 MED ORDER — SODIUM CHLORIDE 0.9 % IV SOLN
Freq: Once | INTRAVENOUS | Status: AC
  Administered 2014-03-02: 12:00:00 via INTRAVENOUS

## 2014-03-02 MED ORDER — PERTUZUMAB CHEMO INJECTION 420 MG/14ML
420.0000 mg | Freq: Once | INTRAVENOUS | Status: AC
  Administered 2014-03-02: 420 mg via INTRAVENOUS
  Filled 2014-03-02: qty 14

## 2014-03-02 MED ORDER — SODIUM CHLORIDE 0.9 % IV SOLN
700.0000 mg | Freq: Once | INTRAVENOUS | Status: AC
  Administered 2014-03-02: 700 mg via INTRAVENOUS
  Filled 2014-03-02: qty 70

## 2014-03-02 MED ORDER — ONDANSETRON 16 MG/50ML IVPB (CHCC)
INTRAVENOUS | Status: AC
  Filled 2014-03-02: qty 16

## 2014-03-02 MED ORDER — ONDANSETRON 16 MG/50ML IVPB (CHCC)
16.0000 mg | Freq: Once | INTRAVENOUS | Status: AC
  Administered 2014-03-02: 16 mg via INTRAVENOUS

## 2014-03-02 MED ORDER — HEPARIN SOD (PORK) LOCK FLUSH 100 UNIT/ML IV SOLN
500.0000 [IU] | Freq: Once | INTRAVENOUS | Status: AC | PRN
  Administered 2014-03-02: 500 [IU]
  Filled 2014-03-02: qty 5

## 2014-03-02 MED ORDER — ACETAMINOPHEN 325 MG PO TABS
ORAL_TABLET | ORAL | Status: AC
  Filled 2014-03-02: qty 2

## 2014-03-02 MED ORDER — DEXAMETHASONE SODIUM PHOSPHATE 20 MG/5ML IJ SOLN
INTRAMUSCULAR | Status: AC
  Filled 2014-03-02: qty 5

## 2014-03-02 MED ORDER — DEXAMETHASONE SODIUM PHOSPHATE 20 MG/5ML IJ SOLN
20.0000 mg | Freq: Once | INTRAMUSCULAR | Status: AC
  Administered 2014-03-02: 20 mg via INTRAVENOUS

## 2014-03-02 MED ORDER — SODIUM CHLORIDE 0.9 % IJ SOLN
10.0000 mL | INTRAMUSCULAR | Status: DC | PRN
  Administered 2014-03-02: 10 mL
  Filled 2014-03-02: qty 10

## 2014-03-02 NOTE — Progress Notes (Signed)
Patient Care Team: Lavera Guise, MD as PCP - General (Internal Medicine)  DIAGNOSIS: Breast cancer of upper-inner quadrant of left female breast Malignant neoplasm of upper-outer quadrant of female breast (Resolved on 02/08/2014)   Primary site: Breast (Left)   Staging method: AJCC 7th Edition   Clinical: Stage IIB (T2, N1, cM0) signed by Eppie Gibson, MD on 01/31/2014 11:11 AM   Pathologic: (cM0)   Summary: Stage IIB (T2, N1, cM0)   SUMMARY OF ONCOLOGIC HISTORY: Oncology History   And     Breast cancer of upper-inner quadrant of left female breast   12/27/2013 Mammogram Mammogram and ultrasound 1.8 cm mass left breast 3:00; 7 cm from nipple with abnormal 1 left axillary lymph node, 2.8 x 1.8 x 1.7 cm, second adjacent smaller nodule 1.2 x 1 x 0.7 cm   01/10/2014 Initial Diagnosis 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        1 AxLN biopsy: ER 70% PR 0% Ki-67 80% HER-2 +2.92, gene copy #5.5    CHIEF COMPLIANT: Followup for chemotherapy  INTERVAL HISTORY: Regina Deleon is a 63 year old African American lady with above-mentioned history of left-sided breast cancer ER/PR positive and HER-2 positive. Started neoadjuvant chemotherapy with Taxotere carboplatin Herceptin and Perjeta 02/09/2014. Today she is feeling well. She was having headache not associated with any blurred or double vision. This has subsided. She reports that she can barely feel the left breast mass anymore. She is very encouraged by this fact. Her fatigue is slightly improved. She continues to have some arthralgias after then last injection but finds them manageable. Denies nausea or vomiting. She voices no other specific complaints today.  REVIEW OF SYSTEMS:   Constitutional: Denies fevers, chills or abnormal weight loss. Positive for fatigue. Eyes: Denies blurriness of vision, headache Ears, nose, mouth, throat, and face: Denies mucositis or sore throat Respiratory: Denies cough, dyspnea  or wheezes Cardiovascular: Denies palpitation, chest discomfort or lower extremity swelling Gastrointestinal:  Denies nausea, heartburn.  Skin: Denies abnormal skin rashes Lymphatics: Denies new lymphadenopathy or easy bruising Neurological:Denies numbness, tingling or new weaknesses Behavioral/Psych: Anxiety and stress regarding recently starting chemotherapy  Reports headache, but now resolved All other systems were reviewed with the patient and are negative.  I have reviewed the past medical history, past surgical history, social history and family history with the patient and they are unchanged from previous note.  ALLERGIES:  is allergic to darvocet; latex; enalapril; and penicillins.  MEDICATIONS:  Current Outpatient Prescriptions  Medication Sig Dispense Refill  . acetaminophen (TYLENOL) 500 MG tablet Take 500 mg by mouth every 6 (six) hours as needed for mild pain.      Marland Kitchen aspirin 81 MG tablet Take 81 mg by mouth every morning.       Marland Kitchen dexamethasone (DECADRON) 4 MG tablet Take 2 tablets (8 mg total) by mouth 2 (two) times daily. Start the day before Taxotere. Then again the day after chemo for 3 days.  30 tablet  1  . fluticasone (FLONASE) 50 MCG/ACT nasal spray Place 2 sprays into both nostrils daily as needed for allergies or rhinitis.      Marland Kitchen HYDROcodone-acetaminophen (NORCO/VICODIN) 5-325 MG per tablet       . lidocaine-prilocaine (EMLA) cream Apply 1 application topically as needed.  30 g  0  . NIFEdipine (PROCARDIA-XL/ADALAT-CC/NIFEDICAL-XL) 30 MG 24 hr tablet Take 30 mg by mouth 2 (two) times daily.       Marland Kitchen  Omega-3 Fatty Acids (OMEGA 3 PO) Take 2 tablets by mouth every morning.      . ondansetron (ZOFRAN) 8 MG tablet Take 1 tablet (8 mg total) by mouth 2 (two) times daily. Start the day after chemo for 3 days. Then take as needed for nausea or vomiting.  30 tablet  1  . ranitidine (ZANTAC) 150 MG tablet Take 150 mg by mouth 2 (two) times daily.      Marland Kitchen  triamterene-hydrochlorothiazide (MAXZIDE-25) 37.5-25 MG per tablet Take 0.5 tablets by mouth every morning. As needed.      . zolpidem (AMBIEN) 10 MG tablet Take 10 mg by mouth at bedtime as needed for sleep.      . cyclobenzaprine (FLEXERIL) 10 MG tablet       . LORazepam (ATIVAN) 0.5 MG tablet Take 1 tablet (0.5 mg total) by mouth every 6 (six) hours as needed (Nausea or vomiting).  30 tablet  0  . prochlorperazine (COMPAZINE) 10 MG tablet Take 1 tablet (10 mg total) by mouth every 6 (six) hours as needed (Nausea or vomiting).  30 tablet  1   No current facility-administered medications for this visit.   Facility-Administered Medications Ordered in Other Visits  Medication Dose Route Frequency Provider Last Rate Last Dose  . CARBOplatin (PARAPLATIN) 700 mg in sodium chloride 0.9 % 250 mL chemo infusion  700 mg Intravenous Once Caliana Spires E Taleah Bellantoni, PA-C      . DOCEtaxel (TAXOTERE) 150 mg in dextrose 5 % 250 mL chemo infusion  75 mg/m2 (Treatment Plan Actual) Intravenous Once Jary Louvier E Angas Isabell, PA-C      . heparin lock flush 100 unit/mL  500 Units Intracatheter Once PRN Iriana Artley E Torrion Witter, PA-C      . pertuzumab (PERJETA) 420 mg in sodium chloride 0.9 % 250 mL chemo infusion  420 mg Intravenous Once Kiosha Buchan E Ludmilla Mcgillis, PA-C      . sodium chloride 0.9 % injection 10 mL  10 mL Intracatheter PRN Rulon Eisenmenger, MD   10 mL at 02/09/14 1720  . sodium chloride 0.9 % injection 10 mL  10 mL Intracatheter PRN Carlton Adam, PA-C        PHYSICAL EXAMINATION: ECOG PERFORMANCE STATUS: 1 - Symptomatic but completely ambulatory  Filed Vitals:   03/02/14 1028  BP: 106/84  Pulse: 97  Temp: 98.3 F (36.8 C)  Resp: 18   Filed Weights   03/02/14 1028  Weight: 201 lb 4.8 oz (91.309 kg)    GENERAL:alert, no distress and comfortable SKIN: skin color, texture, turgor are normal, no rashes or significant lesions EYES: normal, Conjunctiva are pink and non-injected, sclera clear OROPHARYNX:no exudate, no  erythema and lips, buccal mucosa, and tongue normal  NECK: supple, thyroid normal size, non-tender, without nodularity LYMPH:  no palpable lymphadenopathy in the cervical, axillary or inguinal LUNGS: clear to auscultation and percussion with normal breathing effort HEART: regular rate & rhythm and no murmurs and no lower extremity edema ABDOMEN:abdomen soft, non-tender and normal bowel sounds Musculoskeletal:no cyanosis of digits and no clubbing  NEURO: alert & oriented x 3 with fluent speech, no focal motor/sensory deficits BREAST:No palpable nodules in right breast.Pinpoint palpable mass 3:00 left breast at site of biopsy, non-tender. No palpable axillary or supraclavicular lymphadenopathy bilaterally  LABORATORY DATA:  I have reviewed the data as listed   Chemistry      Component Value Date/Time   NA 141 03/02/2014 1014   K 3.7 03/02/2014 1014   CO2 24 03/02/2014 1014  BUN 15.0 03/02/2014 1014   CREATININE 0.8 03/02/2014 1014      Component Value Date/Time   CALCIUM 10.6* 03/02/2014 1014   ALKPHOS 86 03/02/2014 1014   AST 16 03/02/2014 1014   ALT 27 03/02/2014 1014   BILITOT 0.31 03/02/2014 1014       Lab Results  Component Value Date   WBC 16.7* 03/02/2014   HGB 10.9* 03/02/2014   HCT 34.6* 03/02/2014   MCV 84.5 03/02/2014   PLT 238 03/02/2014   NEUTROABS 13.4* 03/02/2014     RADIOGRAPHIC STUDIES: I have personally reviewed the radiology reports and agreed with their findings. No results found.   ASSESSMENT & PLAN:  No problem-specific assessment & plan notes found for this encounter.  The patient is a very pleasant 63 year old African American female with Breast cancer of upper-inner quadrant of left female breast Stage IIB (T2, N1, cM0),  ER/PR positive and HER-2 positive. Started neoadjuvant chemotherapy with Taxotere carboplatin Herceptin and Perjeta 02/09/2014. She will continue her neoadjuvant chemotherapy as scheduled. She'll follow-up with Dr. Lindi Adie in 3  weeks for another symptom management visit prior to her next cycle of neoadjuvant chemotherapy.  Orders Placed This Encounter  Procedures  . CBC with Differential    Standing Status: Future     Number of Occurrences:      Standing Expiration Date: 03/03/2015  . Comprehensive metabolic panel (Cmet) - [facility]    Standing Status: Future     Number of Occurrences:      Standing Expiration Date: 03/03/2015   The patient has a good understanding of the overall plan. she agrees with it. She will call with any problems that may develop before her next visit here.  I spent 20 minutes counseling the patient face to face. The total time spent in the appointment was 30 minutes and more than 50% was on counseling and review of test results    Carlton Adam, PA-C 03/02/2014 1:43 PM

## 2014-03-02 NOTE — Patient Instructions (Signed)
[facility] Discharge Instructions for Patients Receiving Chemotherapy  Today you received the following chemotherapy agents: Herceptin, Perjeta, Taxotere and Carboplatin  To help prevent nausea and vomiting after your treatment, we encourage you to take your nausea medication: Compazine 10mg  every 6 hours and Zofran 8 mg 2 x a day as directed.   If you develop nausea and vomiting that is not controlled by your nausea medication, call the clinic.   BELOW ARE SYMPTOMS THAT SHOULD BE REPORTED IMMEDIATELY:  *FEVER GREATER THAN 100.5 F  *CHILLS WITH OR WITHOUT FEVER  NAUSEA AND VOMITING THAT IS NOT CONTROLLED WITH YOUR NAUSEA MEDICATION  *UNUSUAL SHORTNESS OF BREATH  *UNUSUAL BRUISING OR BLEEDING  TENDERNESS IN MOUTH AND THROAT WITH OR WITHOUT PRESENCE OF ULCERS  *URINARY PROBLEMS  *BOWEL PROBLEMS  UNUSUAL RASH Items with * indicate a potential emergency and should be followed up as soon as possible.  Feel free to call the clinic you have any questions or concerns. The clinic phone number is [phone].

## 2014-03-03 ENCOUNTER — Ambulatory Visit (HOSPITAL_BASED_OUTPATIENT_CLINIC_OR_DEPARTMENT_OTHER): Payer: BC Managed Care – PPO

## 2014-03-03 VITALS — BP 152/83 | HR 100 | Temp 98.0°F | Resp 20

## 2014-03-03 DIAGNOSIS — C50512 Malignant neoplasm of lower-outer quadrant of left female breast: Secondary | ICD-10-CM

## 2014-03-03 DIAGNOSIS — C50212 Malignant neoplasm of upper-inner quadrant of left female breast: Secondary | ICD-10-CM

## 2014-03-03 DIAGNOSIS — C50412 Malignant neoplasm of upper-outer quadrant of left female breast: Secondary | ICD-10-CM

## 2014-03-03 MED ORDER — PEGFILGRASTIM INJECTION 6 MG/0.6ML
6.0000 mg | Freq: Once | SUBCUTANEOUS | Status: AC
  Administered 2014-03-03: 6 mg via SUBCUTANEOUS

## 2014-03-03 NOTE — Patient Instructions (Signed)
Pegfilgrastim injection What is this medicine? PEGFILGRASTIM (peg fil GRA stim) is a long-acting granulocyte colony-stimulating factor that stimulates the growth of neutrophils, a type of white blood cell important in the body's fight against infection. It is used to reduce the incidence of fever and infection in patients with certain types of cancer who are receiving chemotherapy that affects the bone marrow. This medicine may be used for other purposes; ask your health care provider or pharmacist if you have questions. COMMON BRAND NAME(S): Neulasta What should I tell my health care provider before I take this medicine? They need to know if you have any of these conditions: -latex allergy -ongoing radiation therapy -sickle cell disease -skin reactions to acrylic adhesives (On-Body Injector only) -an unusual or allergic reaction to pegfilgrastim, filgrastim, other medicines, foods, dyes, or preservatives -pregnant or trying to get pregnant -breast-feeding How should I use this medicine? This medicine is for injection under the skin. If you get this medicine at home, you will be taught how to prepare and give the pre-filled syringe or how to use the On-body Injector. Refer to the patient Instructions for Use for detailed instructions. Use exactly as directed. Take your medicine at regular intervals. Do not take your medicine more often than directed. It is important that you put your used needles and syringes in a special sharps container. Do not put them in a trash can. If you do not have a sharps container, call your pharmacist or healthcare provider to get one. Talk to your pediatrician regarding the use of this medicine in children. Special care may be needed. Overdosage: If you think you have taken too much of this medicine contact a poison control center or emergency room at once. NOTE: This medicine is only for you. Do not share this medicine with others. What if I miss a dose? It is  important not to miss your dose. Call your doctor or health care professional if you miss your dose. If you miss a dose due to an On-body Injector failure or leakage, a new dose should be administered as soon as possible using a single prefilled syringe for manual use. What may interact with this medicine? Interactions have not been studied. Give your health care provider a list of all the medicines, herbs, non-prescription drugs, or dietary supplements you use. Also tell them if you smoke, drink alcohol, or use illegal drugs. Some items may interact with your medicine. This list may not describe all possible interactions. Give your health care provider a list of all the medicines, herbs, non-prescription drugs, or dietary supplements you use. Also tell them if you smoke, drink alcohol, or use illegal drugs. Some items may interact with your medicine. What should I watch for while using this medicine? You may need blood work done while you are taking this medicine. If you are going to need a MRI, CT scan, or other procedure, tell your doctor that you are using this medicine (On-Body Injector only). What side effects may I notice from receiving this medicine? Side effects that you should report to your doctor or health care professional as soon as possible: -allergic reactions like skin rash, itching or hives, swelling of the face, lips, or tongue -dizziness -fever -pain, redness, or irritation at site where injected -pinpoint red spots on the skin -shortness of breath or breathing problems -stomach or side pain, or pain at the shoulder -swelling -tiredness -trouble passing urine Side effects that usually do not require medical attention (report to your doctor   or health care professional if they continue or are bothersome): -bone pain -muscle pain This list may not describe all possible side effects. Call your doctor for medical advice about side effects. You may report side effects to FDA at  1-800-FDA-1088. Where should I keep my medicine? Keep out of the reach of children. Store pre-filled syringes in a refrigerator between 2 and 8 degrees C (36 and 46 degrees F). Do not freeze. Keep in carton to protect from light. Throw away this medicine if it is left out of the refrigerator for more than 48 hours. Throw away any unused medicine after the expiration date. NOTE: This sheet is a summary. It may not cover all possible information. If you have questions about this medicine, talk to your doctor, pharmacist, or health care provider.  2015, [employer]vier/[employer] Standard. (2013-07-20 16:14:05)  

## 2014-03-03 NOTE — Patient Instructions (Signed)
Continue labs and chemotherapy a schedule Follow-up in 3 weeks for another symptom management visit prior to next scheduled cycle of neoadjuvant chemotherapy

## 2014-03-23 ENCOUNTER — Ambulatory Visit (HOSPITAL_BASED_OUTPATIENT_CLINIC_OR_DEPARTMENT_OTHER): Payer: BC Managed Care – PPO

## 2014-03-23 ENCOUNTER — Telehealth: Payer: Self-pay | Admitting: Hematology and Oncology

## 2014-03-23 ENCOUNTER — Ambulatory Visit (HOSPITAL_BASED_OUTPATIENT_CLINIC_OR_DEPARTMENT_OTHER): Payer: BC Managed Care – PPO | Admitting: Hematology and Oncology

## 2014-03-23 ENCOUNTER — Other Ambulatory Visit (HOSPITAL_BASED_OUTPATIENT_CLINIC_OR_DEPARTMENT_OTHER): Payer: BC Managed Care – PPO

## 2014-03-23 ENCOUNTER — Telehealth: Payer: Self-pay | Admitting: *Deleted

## 2014-03-23 VITALS — BP 133/79 | HR 111 | Temp 97.7°F | Resp 18 | Ht 63.0 in | Wt 201.8 lb

## 2014-03-23 DIAGNOSIS — C50212 Malignant neoplasm of upper-inner quadrant of left female breast: Secondary | ICD-10-CM

## 2014-03-23 DIAGNOSIS — Z5112 Encounter for antineoplastic immunotherapy: Secondary | ICD-10-CM

## 2014-03-23 DIAGNOSIS — Z170 Estrogen receptor positive status [ER+]: Secondary | ICD-10-CM

## 2014-03-23 DIAGNOSIS — Z5111 Encounter for antineoplastic chemotherapy: Secondary | ICD-10-CM

## 2014-03-23 LAB — CBC WITH DIFFERENTIAL/PLATELET
BASO%: 0 % (ref 0.0–2.0)
Basophils Absolute: 0 10*3/uL (ref 0.0–0.1)
EOS%: 0 % (ref 0.0–7.0)
Eosinophils Absolute: 0 10*3/uL (ref 0.0–0.5)
HEMATOCRIT: 32.7 % — AB (ref 34.8–46.6)
HGB: 10.6 g/dL — ABNORMAL LOW (ref 11.6–15.9)
LYMPH%: 19.5 % (ref 14.0–49.7)
MCH: 28.3 pg (ref 25.1–34.0)
MCHC: 32.4 g/dL (ref 31.5–36.0)
MCV: 87.4 fL (ref 79.5–101.0)
MONO#: 0.8 10*3/uL (ref 0.1–0.9)
MONO%: 7.1 % (ref 0.0–14.0)
NEUT#: 8 10*3/uL — ABNORMAL HIGH (ref 1.5–6.5)
NEUT%: 73.4 % (ref 38.4–76.8)
PLATELETS: 236 10*3/uL (ref 145–400)
RBC: 3.74 10*6/uL (ref 3.70–5.45)
RDW: 17.8 % — ABNORMAL HIGH (ref 11.2–14.5)
WBC: 10.8 10*3/uL — ABNORMAL HIGH (ref 3.9–10.3)
lymph#: 2.1 10*3/uL (ref 0.9–3.3)

## 2014-03-23 LAB — COMPREHENSIVE METABOLIC PANEL (CC13)
ALT: 30 U/L (ref 0–55)
ANION GAP: 12 meq/L — AB (ref 3–11)
AST: 17 U/L (ref 5–34)
Albumin: 3.9 g/dL (ref 3.5–5.0)
Alkaline Phosphatase: 88 U/L (ref 40–150)
BUN: 16.6 mg/dL (ref 7.0–26.0)
CO2: 23 meq/L (ref 22–29)
Calcium: 10.9 mg/dL — ABNORMAL HIGH (ref 8.4–10.4)
Chloride: 108 mEq/L (ref 98–109)
Creatinine: 0.8 mg/dL (ref 0.6–1.1)
Glucose: 115 mg/dl (ref 70–140)
Potassium: 3.9 mEq/L (ref 3.5–5.1)
Sodium: 143 mEq/L (ref 136–145)
Total Bilirubin: 0.34 mg/dL (ref 0.20–1.20)
Total Protein: 7.5 g/dL (ref 6.4–8.3)

## 2014-03-23 MED ORDER — DIPHENHYDRAMINE HCL 25 MG PO CAPS
ORAL_CAPSULE | ORAL | Status: AC
  Filled 2014-03-23: qty 2

## 2014-03-23 MED ORDER — ONDANSETRON 16 MG/50ML IVPB (CHCC)
INTRAVENOUS | Status: AC
  Filled 2014-03-23: qty 16

## 2014-03-23 MED ORDER — SODIUM CHLORIDE 0.9 % IJ SOLN
10.0000 mL | INTRAMUSCULAR | Status: DC | PRN
  Administered 2014-03-23: 10 mL
  Filled 2014-03-23: qty 10

## 2014-03-23 MED ORDER — HEPARIN SOD (PORK) LOCK FLUSH 100 UNIT/ML IV SOLN
500.0000 [IU] | Freq: Once | INTRAVENOUS | Status: AC | PRN
  Administered 2014-03-23: 500 [IU]
  Filled 2014-03-23: qty 5

## 2014-03-23 MED ORDER — SODIUM CHLORIDE 0.9 % IV SOLN: Freq: Once | INTRAVENOUS | Status: DC

## 2014-03-23 MED ORDER — SODIUM CHLORIDE 0.9 % IV SOLN
420.0000 mg | Freq: Once | INTRAVENOUS | Status: AC
  Administered 2014-03-23: 420 mg via INTRAVENOUS
  Filled 2014-03-23: qty 14

## 2014-03-23 MED ORDER — ONDANSETRON 16 MG/50ML IVPB (CHCC)
16.0000 mg | Freq: Once | INTRAVENOUS | Status: AC
  Administered 2014-03-23: 16 mg via INTRAVENOUS

## 2014-03-23 MED ORDER — DOCETAXEL CHEMO INJECTION 160 MG/16ML
75.0000 mg/m2 | Freq: Once | INTRAVENOUS | Status: AC
  Administered 2014-03-23: 150 mg via INTRAVENOUS
  Filled 2014-03-23: qty 15

## 2014-03-23 MED ORDER — ACETAMINOPHEN 325 MG PO TABS
ORAL_TABLET | ORAL | Status: AC
  Filled 2014-03-23: qty 2

## 2014-03-23 MED ORDER — SODIUM CHLORIDE 0.9 % IV SOLN
700.0000 mg | Freq: Once | INTRAVENOUS | Status: AC
  Administered 2014-03-23: 700 mg via INTRAVENOUS
  Filled 2014-03-23: qty 70

## 2014-03-23 MED ORDER — SODIUM CHLORIDE 0.9 % IV SOLN
Freq: Once | INTRAVENOUS | Status: AC
  Administered 2014-03-23: 10:00:00 via INTRAVENOUS

## 2014-03-23 MED ORDER — DEXAMETHASONE SODIUM PHOSPHATE 20 MG/5ML IJ SOLN
20.0000 mg | Freq: Once | INTRAMUSCULAR | Status: AC
  Administered 2014-03-23: 20 mg via INTRAVENOUS

## 2014-03-23 MED ORDER — TRASTUZUMAB CHEMO INJECTION 440 MG
6.0000 mg/kg | Freq: Once | INTRAVENOUS | Status: AC
  Administered 2014-03-23: 546 mg via INTRAVENOUS
  Filled 2014-03-23: qty 26

## 2014-03-23 MED ORDER — ACETAMINOPHEN 325 MG PO TABS
650.0000 mg | ORAL_TABLET | Freq: Once | ORAL | Status: AC
  Administered 2014-03-23: 650 mg via ORAL

## 2014-03-23 MED ORDER — DEXAMETHASONE SODIUM PHOSPHATE 20 MG/5ML IJ SOLN
INTRAMUSCULAR | Status: AC
  Filled 2014-03-23: qty 5

## 2014-03-23 MED ORDER — DIPHENHYDRAMINE HCL 25 MG PO CAPS
50.0000 mg | ORAL_CAPSULE | Freq: Once | ORAL | Status: AC
  Administered 2014-03-23: 50 mg via ORAL

## 2014-03-23 NOTE — Assessment & Plan Note (Addendum)
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 AxLN biopsy: ER 70% PR 0% Ki-67 80% HER-2 +2.92, gene copy #5.5 T2, N1, M0 stage IIB Today is cycle 3 of neoadjuvant chemotherapy with Taxotere, carboplatin, Herceptin and Perjeta.  Chemotherapy toxicities: Patient experienced a fall and toxicities from chemotherapy 1. Neulasta related bone pain and arthralgias 2. Constipation 3. Metallic taste in the mouth 4. Fatigue due to chemotherapy 5. Chemotherapy related anemia: Stable will be observed  Hypocalcemia: I instructed her to look at the calcium level in the bowl supplementation that she is taking and disease she can find that supplement with lower calcium intake.  Monitoring her very closely for toxicities. Since he has tolerated chemotherapy fairly well, return to clinic tomorrow for Neulasta and in 3 weeks for cycle #4

## 2014-03-23 NOTE — Progress Notes (Signed)
Patient Care Team: Lavera Guise, MD as PCP - General (Internal Medicine)  DIAGNOSIS: Breast cancer of upper-inner quadrant of left female breast   Staging form: Breast, AJCC 7th Edition     Clinical: Stage IIB (T2, N1, cM0) - Signed by Eppie Gibson, MD on 01/31/2014     Pathologic: cM0 - Unsigned   SUMMARY OF ONCOLOGIC HISTORY: Oncology History   And     Breast cancer of upper-inner quadrant of left female breast   12/27/2013 Mammogram Mammogram and ultrasound 1.8 cm mass left breast 3:00; 7 cm from nipple with abnormal 1 left axillary lymph node, 2.8 x 1.8 x 1.7 cm, second adjacent smaller nodule 1.2 x 1 x 0.7 cm   01/10/2014 Initial Diagnosis 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        1 AxLN biopsy: ER 70% PR 0% Ki-67 80% HER-2 +2.92, gene copy #5.5   02/09/2014 -  Neo-Adjuvant Chemotherapy Neoadjuvant chemotherapy with Taxotere, carboplatin, Herceptin and Perjeta x6 cycles    CHIEF COMPLIANT: patient is here for cycle 3 of neoadjuvant TCHP  INTERVAL HISTORY: Regina Deleon is a 63 year old African American lady with above-mentioned history of HER-2 positive breast cancer she is currently on neoadjuvant chemotherapy. She is tolerating chemotherapy well. She reports that the breast tumor has decreased in size. Her major complaint is fatigue that starts 3 days after chemotherapy last for 24 hours and then gets better. Constipation is improved with milk of magnesia. Denies any nausea vomiting. Has metallic taste in the mouth.  REVIEW OF SYSTEMS:   Constitutional: Denies fevers, chills or abnormal weight loss: Alopecia Eyes: Denies blurriness of vision Ears, nose, mouth, throat, and face: Denies mucositis or sore throat, metallic taste in the mouth Respiratory: Denies cough, dyspnea or wheezes Cardiovascular: Denies palpitation, chest discomfort or lower extremity swelling Gastrointestinal:  Denies nausea, heartburn or change in bowel habits Skin:  Denies abnormal skin rashes Lymphatics: Denies new lymphadenopathy or easy bruising Neurological:Denies numbness, tingling or new weaknesses Behavioral/Psych: Mood is stable, no new changes  Breast: left breast lump has decreased in size All other systems were reviewed with the patient and are negative.  I have reviewed the past medical history, past surgical history, social history and family history with the patient and they are unchanged from previous note.  ALLERGIES:  is allergic to darvocet; latex; enalapril; and penicillins.  MEDICATIONS:  Current Outpatient Prescriptions  Medication Sig Dispense Refill  . acetaminophen (TYLENOL) 500 MG tablet Take 500 mg by mouth every 6 (six) hours as needed for mild pain.    Marland Kitchen aspirin 81 MG tablet Take 81 mg by mouth every morning.     . cyclobenzaprine (FLEXERIL) 10 MG tablet     . dexamethasone (DECADRON) 4 MG tablet Take 2 tablets (8 mg total) by mouth 2 (two) times daily. Start the day before Taxotere. Then again the day after chemo for 3 days. 30 tablet 1  . fluticasone (FLONASE) 50 MCG/ACT nasal spray Place 2 sprays into both nostrils daily as needed for allergies or rhinitis.    Marland Kitchen HYDROcodone-acetaminophen (NORCO/VICODIN) 5-325 MG per tablet     . lidocaine-prilocaine (EMLA) cream Apply 1 application topically as needed. 30 g 0  . LORazepam (ATIVAN) 0.5 MG tablet Take 1 tablet (0.5 mg total) by mouth every 6 (six) hours as needed (Nausea or vomiting). 30 tablet 0  . NIFEdipine (PROCARDIA-XL/ADALAT-CC/NIFEDICAL-XL) 30 MG 24 hr tablet Take 30 mg by  mouth 2 (two) times daily.     . Omega-3 Fatty Acids (OMEGA 3 PO) Take 2 tablets by mouth every morning.    . ondansetron (ZOFRAN) 8 MG tablet Take 1 tablet (8 mg total) by mouth 2 (two) times daily. Start the day after chemo for 3 days. Then take as needed for nausea or vomiting. 30 tablet 1  . prochlorperazine (COMPAZINE) 10 MG tablet Take 1 tablet (10 mg total) by mouth every 6 (six) hours as  needed (Nausea or vomiting). 30 tablet 1  . ranitidine (ZANTAC) 150 MG tablet Take 150 mg by mouth 2 (two) times daily.    Marland Kitchen triamterene-hydrochlorothiazide (MAXZIDE-25) 37.5-25 MG per tablet Take 0.5 tablets by mouth every morning. As needed.    . zolpidem (AMBIEN) 10 MG tablet Take 10 mg by mouth at bedtime as needed for sleep.     No current facility-administered medications for this visit.   Facility-Administered Medications Ordered in Other Visits  Medication Dose Route Frequency Provider Last Rate Last Dose  . sodium chloride 0.9 % injection 10 mL  10 mL Intracatheter PRN Rulon Eisenmenger, MD   10 mL at 02/09/14 1720    PHYSICAL EXAMINATION: ECOG PERFORMANCE STATUS: 1 - Symptomatic but completely ambulatory  Filed Vitals:   03/23/14 0821  BP: 133/79  Pulse: 111  Temp: 97.7 F (36.5 C)  Resp: 18   Filed Weights   03/23/14 0821  Weight: 201 lb 12.8 oz (91.536 kg)    GENERAL:alert, no distress and comfortable SKIN: skin color, texture, turgor are normal, no rashes or significant lesions EYES: normal, Conjunctiva are pink and non-injected, sclera clear OROPHARYNX:no exudate, no erythema and lips, buccal mucosa, and tongue normal  NECK: supple, thyroid normal size, non-tender, without nodularity LYMPH:  no palpable lymphadenopathy in the cervical, axillary or inguinal LUNGS: clear to auscultation and percussion with normal breathing effort HEART: regular rate & rhythm and no murmurs and no lower extremity edema ABDOMEN:abdomen soft, non-tender and normal bowel sounds Musculoskeletal:no cyanosis of digits and no clubbing  NEURO: alert & oriented x 3 with fluent speech, no focal motor/sensory deficits BREAST:left breast lump is no longer palpable  LABORATORY DATA:  I have reviewed the data as listed   Chemistry      Component Value Date/Time   NA 143 03/23/2014 0805   K 3.9 03/23/2014 0805   CO2 23 03/23/2014 0805   BUN 16.6 03/23/2014 0805   CREATININE 0.8 03/23/2014  0805      Component Value Date/Time   CALCIUM 10.9* 03/23/2014 0805   ALKPHOS 88 03/23/2014 0805   AST 17 03/23/2014 0805   ALT 30 03/23/2014 0805   BILITOT 0.34 03/23/2014 0805       Lab Results  Component Value Date   WBC 10.8* 03/23/2014   HGB 10.6* 03/23/2014   HCT 32.7* 03/23/2014   MCV 87.4 03/23/2014   PLT 236 03/23/2014   NEUTROABS 8.0* 03/23/2014   ASSESSMENT & PLAN:  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 AxLN biopsy: ER 70% PR 0% Ki-67 80% HER-2 +2.92, gene copy #5.5 T2, N1, M0 stage IIB Today is cycle 3 of neoadjuvant chemotherapy with Taxotere, carboplatin, Herceptin and Perjeta.  Chemotherapy toxicities: Patient experienced a fall and toxicities from chemotherapy 1. Neulasta related bone pain and arthralgias 2. Constipation 3. Metallic taste in the mouth 4. Fatigue due to chemotherapy 5. Chemotherapy  related anemia: Stable will be observed  Hypocalcemia: I instructed her to look at the calcium level in the bowl supplementation that she is taking and disease she can find that supplement with lower calcium intake.  Monitoring her very closely for toxicities. Since he has tolerated chemotherapy fairly well, return to clinic tomorrow for Neulasta and in 3 weeks for cycle #4   No orders of the defined types were placed in this encounter.   The patient has a good understanding of the overall plan. she agrees with it. She will call with any problems that may develop before her next visit here.   Rulon Eisenmenger, MD 03/23/2014 8:53 AM

## 2014-03-23 NOTE — Telephone Encounter (Signed)
, °

## 2014-03-23 NOTE — Telephone Encounter (Signed)
Per staff message and POF I have scheduled appts. Advised scheduler of appts. JMW  

## 2014-03-23 NOTE — Patient Instructions (Signed)
[facility] Discharge Instructions for Patients Receiving Chemotherapy  Today you received the following chemotherapy agents: Herceptin, Perjeta, Taxotere and Carboplatin  To help prevent nausea and vomiting after your treatment, we encourage you to take your nausea medication: Compazine 10mg  every 6 hours and Zofran 8 mg 2 x a day as directed.   If you develop nausea and vomiting that is not controlled by your nausea medication, call the clinic.   BELOW ARE SYMPTOMS THAT SHOULD BE REPORTED IMMEDIATELY:  *FEVER GREATER THAN 100.5 F  *CHILLS WITH OR WITHOUT FEVER  NAUSEA AND VOMITING THAT IS NOT CONTROLLED WITH YOUR NAUSEA MEDICATION  *UNUSUAL SHORTNESS OF BREATH  *UNUSUAL BRUISING OR BLEEDING  TENDERNESS IN MOUTH AND THROAT WITH OR WITHOUT PRESENCE OF ULCERS  *URINARY PROBLEMS  *BOWEL PROBLEMS  UNUSUAL RASH Items with * indicate a potential emergency and should be followed up as soon as possible.  Feel free to call the clinic you have any questions or concerns. The clinic phone number is [phone].

## 2014-03-24 ENCOUNTER — Ambulatory Visit (HOSPITAL_BASED_OUTPATIENT_CLINIC_OR_DEPARTMENT_OTHER): Payer: BC Managed Care – PPO

## 2014-03-24 DIAGNOSIS — Z5189 Encounter for other specified aftercare: Secondary | ICD-10-CM

## 2014-03-24 DIAGNOSIS — C50212 Malignant neoplasm of upper-inner quadrant of left female breast: Secondary | ICD-10-CM

## 2014-03-24 MED ORDER — PEGFILGRASTIM INJECTION 6 MG/0.6ML ~~LOC~~
6.0000 mg | PREFILLED_SYRINGE | Freq: Once | SUBCUTANEOUS | Status: AC
  Administered 2014-03-24: 6 mg via SUBCUTANEOUS

## 2014-03-24 NOTE — Patient Instructions (Signed)
Pegfilgrastim injection What is this medicine? PEGFILGRASTIM (peg fil GRA stim) is a long-acting granulocyte colony-stimulating factor that stimulates the growth of neutrophils, a type of white blood cell important in the body's fight against infection. It is used to reduce the incidence of fever and infection in patients with certain types of cancer who are receiving chemotherapy that affects the bone marrow. This medicine may be used for other purposes; ask your health care provider or pharmacist if you have questions. COMMON BRAND NAME(S): Neulasta What should I tell my health care provider before I take this medicine? They need to know if you have any of these conditions: -latex allergy -ongoing radiation therapy -sickle cell disease -skin reactions to acrylic adhesives (On-Body Injector only) -an unusual or allergic reaction to pegfilgrastim, filgrastim, other medicines, foods, dyes, or preservatives -pregnant or trying to get pregnant -breast-feeding How should I use this medicine? This medicine is for injection under the skin. If you get this medicine at home, you will be taught how to prepare and give the pre-filled syringe or how to use the On-body Injector. Refer to the patient Instructions for Use for detailed instructions. Use exactly as directed. Take your medicine at regular intervals. Do not take your medicine more often than directed. It is important that you put your used needles and syringes in a special sharps container. Do not put them in a trash can. If you do not have a sharps container, call your pharmacist or healthcare provider to get one. Talk to your pediatrician regarding the use of this medicine in children. Special care may be needed. Overdosage: If you think you have taken too much of this medicine contact a poison control center or emergency room at once. NOTE: This medicine is only for you. Do not share this medicine with others. What if I miss a dose? It is  important not to miss your dose. Call your doctor or health care professional if you miss your dose. If you miss a dose due to an On-body Injector failure or leakage, a new dose should be administered as soon as possible using a single prefilled syringe for manual use. What may interact with this medicine? Interactions have not been studied. Give your health care provider a list of all the medicines, herbs, non-prescription drugs, or dietary supplements you use. Also tell them if you smoke, drink alcohol, or use illegal drugs. Some items may interact with your medicine. This list may not describe all possible interactions. Give your health care provider a list of all the medicines, herbs, non-prescription drugs, or dietary supplements you use. Also tell them if you smoke, drink alcohol, or use illegal drugs. Some items may interact with your medicine. What should I watch for while using this medicine? You may need blood work done while you are taking this medicine. If you are going to need a MRI, CT scan, or other procedure, tell your doctor that you are using this medicine (On-Body Injector only). What side effects may I notice from receiving this medicine? Side effects that you should report to your doctor or health care professional as soon as possible: -allergic reactions like skin rash, itching or hives, swelling of the face, lips, or tongue -dizziness -fever -pain, redness, or irritation at site where injected -pinpoint red spots on the skin -shortness of breath or breathing problems -stomach or side pain, or pain at the shoulder -swelling -tiredness -trouble passing urine Side effects that usually do not require medical attention (report to your doctor   or health care professional if they continue or are bothersome): -bone pain -muscle pain This list may not describe all possible side effects. Call your doctor for medical advice about side effects. You may report side effects to FDA at  1-800-FDA-1088. Where should I keep my medicine? Keep out of the reach of children. Store pre-filled syringes in a refrigerator between 2 and 8 degrees C (36 and 46 degrees F). Do not freeze. Keep in carton to protect from light. Throw away this medicine if it is left out of the refrigerator for more than 48 hours. Throw away any unused medicine after the expiration date. NOTE: This sheet is a summary. It may not cover all possible information. If you have questions about this medicine, talk to your doctor, pharmacist, or health care provider.  2015, [employer]vier/[employer] Standard. (2013-07-20 16:14:05)  

## 2014-04-12 ENCOUNTER — Other Ambulatory Visit: Payer: Self-pay | Admitting: *Deleted

## 2014-04-12 DIAGNOSIS — C50212 Malignant neoplasm of upper-inner quadrant of left female breast: Secondary | ICD-10-CM

## 2014-04-13 ENCOUNTER — Other Ambulatory Visit: Payer: BC Managed Care – PPO

## 2014-04-13 ENCOUNTER — Ambulatory Visit (HOSPITAL_BASED_OUTPATIENT_CLINIC_OR_DEPARTMENT_OTHER): Payer: BC Managed Care – PPO

## 2014-04-13 ENCOUNTER — Telehealth: Payer: Self-pay | Admitting: Hematology and Oncology

## 2014-04-13 ENCOUNTER — Ambulatory Visit (HOSPITAL_BASED_OUTPATIENT_CLINIC_OR_DEPARTMENT_OTHER): Payer: BC Managed Care – PPO | Admitting: Hematology and Oncology

## 2014-04-13 ENCOUNTER — Other Ambulatory Visit (HOSPITAL_BASED_OUTPATIENT_CLINIC_OR_DEPARTMENT_OTHER): Payer: BC Managed Care – PPO

## 2014-04-13 VITALS — BP 155/73 | HR 90 | Temp 98.2°F | Resp 20 | Ht 63.0 in | Wt 202.9 lb

## 2014-04-13 DIAGNOSIS — C50412 Malignant neoplasm of upper-outer quadrant of left female breast: Secondary | ICD-10-CM

## 2014-04-13 DIAGNOSIS — Z5112 Encounter for antineoplastic immunotherapy: Secondary | ICD-10-CM

## 2014-04-13 DIAGNOSIS — C50512 Malignant neoplasm of lower-outer quadrant of left female breast: Secondary | ICD-10-CM

## 2014-04-13 DIAGNOSIS — D6481 Anemia due to antineoplastic chemotherapy: Secondary | ICD-10-CM

## 2014-04-13 DIAGNOSIS — R530 Neoplastic (malignant) related fatigue: Secondary | ICD-10-CM

## 2014-04-13 DIAGNOSIS — M898X9 Other specified disorders of bone, unspecified site: Secondary | ICD-10-CM

## 2014-04-13 DIAGNOSIS — C50212 Malignant neoplasm of upper-inner quadrant of left female breast: Secondary | ICD-10-CM

## 2014-04-13 DIAGNOSIS — D6959 Other secondary thrombocytopenia: Secondary | ICD-10-CM

## 2014-04-13 DIAGNOSIS — K5900 Constipation, unspecified: Secondary | ICD-10-CM

## 2014-04-13 DIAGNOSIS — Z5111 Encounter for antineoplastic chemotherapy: Secondary | ICD-10-CM

## 2014-04-13 LAB — CBC WITH DIFFERENTIAL/PLATELET
BASO%: 0.1 % (ref 0.0–2.0)
Basophils Absolute: 0 10*3/uL (ref 0.0–0.1)
EOS%: 0 % (ref 0.0–7.0)
Eosinophils Absolute: 0 10*3/uL (ref 0.0–0.5)
HCT: 28.1 % — ABNORMAL LOW (ref 34.8–46.6)
HGB: 8.9 g/dL — ABNORMAL LOW (ref 11.6–15.9)
LYMPH#: 1.9 10*3/uL (ref 0.9–3.3)
LYMPH%: 11.4 % — AB (ref 14.0–49.7)
MCH: 28.4 pg (ref 25.1–34.0)
MCHC: 31.6 g/dL (ref 31.5–36.0)
MCV: 90.1 fL (ref 79.5–101.0)
MONO#: 1.5 10*3/uL — ABNORMAL HIGH (ref 0.1–0.9)
MONO%: 8.8 % (ref 0.0–14.0)
NEUT#: 13.2 10*3/uL — ABNORMAL HIGH (ref 1.5–6.5)
NEUT%: 79.7 % — ABNORMAL HIGH (ref 38.4–76.8)
PLATELETS: 111 10*3/uL — AB (ref 145–400)
RBC: 3.12 10*6/uL — ABNORMAL LOW (ref 3.70–5.45)
RDW: 20.3 % — ABNORMAL HIGH (ref 11.2–14.5)
WBC: 16.6 10*3/uL — AB (ref 3.9–10.3)

## 2014-04-13 LAB — COMPREHENSIVE METABOLIC PANEL (CC13)
ALT: 18 U/L (ref 0–55)
ANION GAP: 11 meq/L (ref 3–11)
AST: 14 U/L (ref 5–34)
Albumin: 3.6 g/dL (ref 3.5–5.0)
Alkaline Phosphatase: 83 U/L (ref 40–150)
BILIRUBIN TOTAL: 0.32 mg/dL (ref 0.20–1.20)
BUN: 16.1 mg/dL (ref 7.0–26.0)
CO2: 23 mEq/L (ref 22–29)
Calcium: 10.5 mg/dL — ABNORMAL HIGH (ref 8.4–10.4)
Chloride: 110 mEq/L — ABNORMAL HIGH (ref 98–109)
Creatinine: 0.7 mg/dL (ref 0.6–1.1)
EGFR: >90 mL/min/{1.73_m2} (ref >90)
Glucose: 135 mg/dl (ref 70–140)
Potassium: 3.8 mEq/L (ref 3.5–5.1)
SODIUM: 144 meq/L (ref 136–145)
Total Protein: 6.6 g/dL (ref 6.4–8.3)

## 2014-04-13 MED ORDER — SODIUM CHLORIDE 0.9 % IV SOLN
420.0000 mg | Freq: Once | INTRAVENOUS | Status: AC
  Administered 2014-04-13: 420 mg via INTRAVENOUS
  Filled 2014-04-13: qty 14

## 2014-04-13 MED ORDER — ONDANSETRON 16 MG/50ML IVPB (CHCC)
INTRAVENOUS | Status: AC
  Filled 2014-04-13: qty 16

## 2014-04-13 MED ORDER — DIPHENHYDRAMINE HCL 25 MG PO CAPS
ORAL_CAPSULE | ORAL | Status: AC
  Filled 2014-04-13: qty 1

## 2014-04-13 MED ORDER — HEPARIN SOD (PORK) LOCK FLUSH 100 UNIT/ML IV SOLN
500.0000 [IU] | Freq: Once | INTRAVENOUS | Status: AC | PRN
  Administered 2014-04-13: 500 [IU]
  Filled 2014-04-13: qty 5

## 2014-04-13 MED ORDER — ACETAMINOPHEN 325 MG PO TABS
ORAL_TABLET | ORAL | Status: AC
  Filled 2014-04-13: qty 2

## 2014-04-13 MED ORDER — DOCETAXEL CHEMO INJECTION 160 MG/16ML
65.0000 mg/m2 | Freq: Once | INTRAVENOUS | Status: AC
  Administered 2014-04-13: 130 mg via INTRAVENOUS
  Filled 2014-04-13: qty 13

## 2014-04-13 MED ORDER — TRASTUZUMAB CHEMO INJECTION 440 MG
6.0000 mg/kg | Freq: Once | INTRAVENOUS | Status: AC
  Administered 2014-04-13: 546 mg via INTRAVENOUS
  Filled 2014-04-13: qty 26

## 2014-04-13 MED ORDER — SODIUM CHLORIDE 0.9 % IV SOLN
647.0000 mg | Freq: Once | INTRAVENOUS | Status: AC
  Administered 2014-04-13: 650 mg via INTRAVENOUS
  Filled 2014-04-13: qty 65

## 2014-04-13 MED ORDER — ACETAMINOPHEN 325 MG PO TABS
650.0000 mg | ORAL_TABLET | Freq: Once | ORAL | Status: AC
  Administered 2014-04-13: 650 mg via ORAL

## 2014-04-13 MED ORDER — DEXAMETHASONE SODIUM PHOSPHATE 20 MG/5ML IJ SOLN
20.0000 mg | Freq: Once | INTRAMUSCULAR | Status: AC
  Administered 2014-04-13: 20 mg via INTRAVENOUS

## 2014-04-13 MED ORDER — DIPHENHYDRAMINE HCL 25 MG PO CAPS
50.0000 mg | ORAL_CAPSULE | Freq: Once | ORAL | Status: AC
  Administered 2014-04-13: 50 mg via ORAL

## 2014-04-13 MED ORDER — DEXAMETHASONE SODIUM PHOSPHATE 20 MG/5ML IJ SOLN
INTRAMUSCULAR | Status: AC
  Filled 2014-04-13: qty 5

## 2014-04-13 MED ORDER — SODIUM CHLORIDE 0.9 % IJ SOLN
10.0000 mL | INTRAMUSCULAR | Status: DC | PRN
  Administered 2014-04-13: 10 mL
  Filled 2014-04-13: qty 10

## 2014-04-13 MED ORDER — ONDANSETRON 16 MG/50ML IVPB (CHCC)
16.0000 mg | Freq: Once | INTRAVENOUS | Status: AC
  Administered 2014-04-13: 16 mg via INTRAVENOUS

## 2014-04-13 MED ORDER — SODIUM CHLORIDE 0.9 % IV SOLN
Freq: Once | INTRAVENOUS | Status: AC
  Administered 2014-04-13: 11:00:00 via INTRAVENOUS

## 2014-04-13 NOTE — Progress Notes (Signed)
Patient Care Team: Lavera Guise, MD as PCP - General (Internal Medicine)  DIAGNOSIS: Breast cancer of upper-inner quadrant of left female breast   Staging form: Breast, AJCC 7th Edition     Clinical: Stage IIB (T2, N1, cM0) - Signed by Eppie Gibson, MD on 01/31/2014     Pathologic: cM0 - Unsigned   SUMMARY OF ONCOLOGIC HISTORY:        Breast cancer of upper-inner quadrant of left female breast   12/27/2013 Mammogram Mammogram and ultrasound 1.8 cm mass left breast 3:00; 7 cm from nipple with abnormal 1 left axillary lymph node, 2.8 x 1.8 x 1.7 cm, second adjacent smaller nodule 1.2 x 1 x 0.7 cm   01/10/2014 Initial Diagnosis 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        1 AxLN biopsy: ER 70% PR 0% Ki-67 80% HER-2 +2.92, gene copy #5.5   02/09/2014 -  Neo-Adjuvant Chemotherapy Neoadjuvant chemotherapy with Taxotere, carboplatin, Herceptin and Perjeta x6 cycles    CHIEF COMPLIANT: Cycle 4 day 1 TCH Perjeta  INTERVAL HISTORY: Regina Deleon is a 41 [employer] recommended with above-mentioned history of left-sided breast cancer being treated with neoadjuvant chemotherapy with [place]. Patient has been tolerating chemotherapy fairly well except for anemia, thrombocytopenia, lack of taste, alopecia. She is here for cycle 4 of chemotherapy. Denies any major problems or concerns. She denies any nausea vomiting. Denies any fevers or chills.  REVIEW OF SYSTEMS:   Constitutional: Denies fevers, chills or abnormal weight loss Eyes: Denies blurriness of vision Ears, nose, mouth, throat, and face: Denies mucositis or sore throat Respiratory: Denies cough, dyspnea or wheezes Cardiovascular: Denies palpitation, chest discomfort or lower extremity swelling Gastrointestinal:  Denies nausea, heartburn or change in bowel habits Skin: Denies abnormal skin rashes Lymphatics: Denies new lymphadenopathy or easy bruising Neurological:Denies numbness, tingling or new  weaknesses Behavioral/Psych: Mood is stable, no new changes  Breast: Left breast lump no longer palpable All other systems were reviewed with the patient and are negative.  I have reviewed the past medical history, past surgical history, social history and family history with the patient and they are unchanged from previous note.  ALLERGIES:  is allergic to darvocet; latex; enalapril; and penicillins.  MEDICATIONS:  Current Outpatient Prescriptions  Medication Sig Dispense Refill  . acetaminophen (TYLENOL) 500 MG tablet Take 500 mg by mouth every 6 (six) hours as needed for mild pain.    Marland Kitchen aspirin 81 MG tablet Take 81 mg by mouth every morning.     . cyclobenzaprine (FLEXERIL) 10 MG tablet     . dexamethasone (DECADRON) 4 MG tablet Take 2 tablets (8 mg total) by mouth 2 (two) times daily. Start the day before Taxotere. Then again the day after chemo for 3 days. 30 tablet 1  . fluticasone (FLONASE) 50 MCG/ACT nasal spray Place 2 sprays into both nostrils daily as needed for allergies or rhinitis.    Marland Kitchen HYDROcodone-acetaminophen (NORCO/VICODIN) 5-325 MG per tablet     . lidocaine-prilocaine (EMLA) cream Apply 1 application topically as needed. 30 g 0  . LORazepam (ATIVAN) 0.5 MG tablet Take 1 tablet (0.5 mg total) by mouth every 6 (six) hours as needed (Nausea or vomiting). 30 tablet 0  . NIFEdipine (PROCARDIA-XL/ADALAT-CC/NIFEDICAL-XL) 30 MG 24 hr tablet Take 30 mg by mouth 2 (two) times daily.     . Omega-3 Fatty Acids (OMEGA 3 PO) Take 2 tablets by mouth every morning.    Marland Kitchen  ondansetron (ZOFRAN) 8 MG tablet Take 1 tablet (8 mg total) by mouth 2 (two) times daily. Start the day after chemo for 3 days. Then take as needed for nausea or vomiting. 30 tablet 1  . prochlorperazine (COMPAZINE) 10 MG tablet Take 1 tablet (10 mg total) by mouth every 6 (six) hours as needed (Nausea or vomiting). 30 tablet 1  . ranitidine (ZANTAC) 150 MG tablet Take 150 mg by mouth 2 (two) times daily.    Marland Kitchen  triamterene-hydrochlorothiazide (MAXZIDE-25) 37.5-25 MG per tablet Take 0.5 tablets by mouth every morning. As needed.    . zolpidem (AMBIEN) 10 MG tablet Take 10 mg by mouth at bedtime as needed for sleep.     No current facility-administered medications for this visit.   Facility-Administered Medications Ordered in Other Visits  Medication Dose Route Frequency Provider Last Rate Last Dose  . sodium chloride 0.9 % injection 10 mL  10 mL Intracatheter PRN Rulon Eisenmenger, MD   10 mL at 02/09/14 1720    PHYSICAL EXAMINATION: ECOG PERFORMANCE STATUS: 1 - Symptomatic but completely ambulatory  Filed Vitals:   04/13/14 0954  BP: 155/73  Pulse: 90  Temp: 98.2 F (36.8 C)  Resp: 20   Filed Weights   04/13/14 0954  Weight: 202 lb 14.4 oz (92.035 kg)    GENERAL:alert, no distress and comfortable SKIN: skin color, texture, turgor are normal, no rashes or significant lesions EYES: normal, Conjunctiva are pink and non-injected, sclera clear OROPHARYNX:no exudate, no erythema and lips, buccal mucosa, and tongue normal  NECK: supple, thyroid normal size, non-tender, without nodularity LYMPH:  no palpable lymphadenopathy in the cervical, axillary or inguinal LUNGS: clear to auscultation and percussion with normal breathing effort HEART: regular rate & rhythm and no murmurs and no lower extremity edema ABDOMEN:abdomen soft, non-tender and normal bowel sounds Musculoskeletal:no cyanosis of digits and no clubbing  NEURO: alert & oriented x 3 with fluent speech, no focal motor/sensory deficits  LABORATORY DATA:  I have reviewed the data as listed   Chemistry      Component Value Date/Time   NA 144 04/13/2014 0938   K 3.8 04/13/2014 0938   CO2 23 04/13/2014 0938   BUN 16.1 04/13/2014 0938   CREATININE 0.7 04/13/2014 0938      Component Value Date/Time   CALCIUM 10.5* 04/13/2014 0938   ALKPHOS 83 04/13/2014 0938   AST 14 04/13/2014 0938   ALT 18 04/13/2014 0938   BILITOT 0.32  04/13/2014 0938       Lab Results  Component Value Date   WBC 16.6* 04/13/2014   HGB 8.9* 04/13/2014   HCT 28.1* 04/13/2014   MCV 90.1 04/13/2014   PLT 111* 04/13/2014   NEUTROABS 13.2* 04/13/2014   ASSESSMENT & PLAN:  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 AxLN biopsy: ER 70% PR 0% Ki-67 80% HER-2 +2.92, gene copy #5.5 T2, N1, M0 stage IIB  Current treatment:Today is cycle 4 of neoadjuvant chemotherapy with Taxotere, carboplatin, Herceptin and Perjeta.  Chemotherapy toxicities: Patient experienced the following toxicities from chemotherapy 1. Neulasta related bone pain and arthralgias 2. Constipation 3. Metallic taste in the mouth 4. Fatigue due to chemotherapy 5. Chemotherapy related anemia: Slowly declining now 8.9 today. 6. Hypocalcemia 7. Chemotherapy induced thrombocytopenia: Platelets 111. We will reduce the dosage of chemotherapy for the next 3 cycles.  Plan: Does reduce Taxotere to  65 mg/m and carboplatin to AUC 5  Monitoring her very closely for toxicities. Since he has tolerated chemotherapy fairly well, return to clinic tomorrow for Neulasta and in 1 weeks for toxicity check.    Orders Placed This Encounter  Procedures  . CBC with Differential    Standing Status: Future     Number of Occurrences:      Standing Expiration Date: 04/13/2015  . Comprehensive metabolic panel (Cmet) - [facility]    Standing Status: Future     Number of Occurrences:      Standing Expiration Date: 04/13/2015   The patient has a good understanding of the overall plan. she agrees with it. She will call with any problems that may develop before her next visit here.   Rulon Eisenmenger, MD 04/13/2014 10:56 AM

## 2014-04-13 NOTE — Telephone Encounter (Signed)
per pof to sch pt appt-gave pt copy of updated sch °

## 2014-04-13 NOTE — Patient Instructions (Signed)
[facility] Discharge Instructions for Patients Receiving Chemotherapy  Today you received the following chemotherapy agents: Herceptin, Perjeta, Taxotere, Carboplatin.  To help prevent nausea and vomiting after your treatment, we encourage you to take your nausea medication.    If you develop nausea and vomiting that is not controlled by your nausea medication, call the clinic.   BELOW ARE SYMPTOMS THAT SHOULD BE REPORTED IMMEDIATELY:  *FEVER GREATER THAN 100.5 F  *CHILLS WITH OR WITHOUT FEVER  NAUSEA AND VOMITING THAT IS NOT CONTROLLED WITH YOUR NAUSEA MEDICATION  *UNUSUAL SHORTNESS OF BREATH  *UNUSUAL BRUISING OR BLEEDING  TENDERNESS IN MOUTH AND THROAT WITH OR WITHOUT PRESENCE OF ULCERS  *URINARY PROBLEMS  *BOWEL PROBLEMS  UNUSUAL RASH Items with * indicate a potential emergency and should be followed up as soon as possible.  Feel free to call the clinic you have any questions or concerns. The clinic phone number is [phone].

## 2014-04-13 NOTE — Assessment & Plan Note (Addendum)
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 AxLN biopsy: ER 70% PR 0% Ki-67 80% HER-2 +2.92, gene copy #5.5 T2, N1, M0 stage IIB  Current treatment:Today is cycle 4 of neoadjuvant chemotherapy with Taxotere, carboplatin, Herceptin and Perjeta.  Chemotherapy toxicities: Patient experienced the following toxicities from chemotherapy 1. Neulasta related bone pain and arthralgias 2. Constipation 3. Metallic taste in the mouth 4. Fatigue due to chemotherapy 5. Chemotherapy related anemia: Slowly declining now 8.9 today. 6. Hypocalcemia 7. Chemotherapy induced thrombocytopenia: Platelets 111. We will reduce the dosage of chemotherapy for the next 3 cycles.  Plan: Does reduce Taxotere to 65 mg/m and carboplatin to AUC 5  Monitoring her very closely for toxicities. Since he has tolerated chemotherapy fairly well, return to clinic tomorrow for Neulasta and in 1 weeks for toxicity check.  

## 2014-04-14 ENCOUNTER — Ambulatory Visit (HOSPITAL_BASED_OUTPATIENT_CLINIC_OR_DEPARTMENT_OTHER): Payer: BC Managed Care – PPO

## 2014-04-14 DIAGNOSIS — Z5189 Encounter for other specified aftercare: Secondary | ICD-10-CM

## 2014-04-14 DIAGNOSIS — C50512 Malignant neoplasm of lower-outer quadrant of left female breast: Secondary | ICD-10-CM

## 2014-04-14 DIAGNOSIS — C50412 Malignant neoplasm of upper-outer quadrant of left female breast: Secondary | ICD-10-CM

## 2014-04-14 MED ORDER — PEGFILGRASTIM INJECTION 6 MG/0.6ML ~~LOC~~
6.0000 mg | PREFILLED_SYRINGE | Freq: Once | SUBCUTANEOUS | Status: AC
  Administered 2014-04-14: 6 mg via SUBCUTANEOUS

## 2014-04-20 ENCOUNTER — Ambulatory Visit (HOSPITAL_BASED_OUTPATIENT_CLINIC_OR_DEPARTMENT_OTHER): Payer: BC Managed Care – PPO | Admitting: Hematology and Oncology

## 2014-04-20 ENCOUNTER — Telehealth: Payer: Self-pay | Admitting: Hematology and Oncology

## 2014-04-20 ENCOUNTER — Ambulatory Visit (HOSPITAL_BASED_OUTPATIENT_CLINIC_OR_DEPARTMENT_OTHER): Payer: BC Managed Care – PPO

## 2014-04-20 ENCOUNTER — Other Ambulatory Visit (HOSPITAL_BASED_OUTPATIENT_CLINIC_OR_DEPARTMENT_OTHER): Payer: BC Managed Care – PPO

## 2014-04-20 VITALS — BP 130/105 | HR 111 | Temp 98.3°F | Resp 20 | Ht 63.0 in | Wt 196.8 lb

## 2014-04-20 DIAGNOSIS — C50212 Malignant neoplasm of upper-inner quadrant of left female breast: Secondary | ICD-10-CM

## 2014-04-20 DIAGNOSIS — D6481 Anemia due to antineoplastic chemotherapy: Secondary | ICD-10-CM

## 2014-04-20 DIAGNOSIS — R530 Neoplastic (malignant) related fatigue: Secondary | ICD-10-CM

## 2014-04-20 DIAGNOSIS — D6959 Other secondary thrombocytopenia: Secondary | ICD-10-CM

## 2014-04-20 LAB — COMPREHENSIVE METABOLIC PANEL (CC13)
ALK PHOS: 86 U/L (ref 40–150)
ALT: 27 U/L (ref 0–55)
AST: 16 U/L (ref 5–34)
Albumin: 3.6 g/dL (ref 3.5–5.0)
Anion Gap: 12 mEq/L — ABNORMAL HIGH (ref 3–11)
BUN: 10.4 mg/dL (ref 7.0–26.0)
CO2: 26 mEq/L (ref 22–29)
Calcium: 9.8 mg/dL (ref 8.4–10.4)
Chloride: 103 mEq/L (ref 98–109)
Creatinine: 0.7 mg/dL (ref 0.6–1.1)
EGFR: >90 mL/min/{1.73_m2} (ref >90)
Glucose: 106 mg/dl (ref 70–140)
POTASSIUM: 3.3 meq/L — AB (ref 3.5–5.1)
SODIUM: 140 meq/L (ref 136–145)
TOTAL PROTEIN: 6.4 g/dL (ref 6.4–8.3)
Total Bilirubin: 0.55 mg/dL (ref 0.20–1.20)

## 2014-04-20 LAB — CBC WITH DIFFERENTIAL/PLATELET
BASO%: 0 % (ref 0.0–2.0)
Basophils Absolute: 0 10*3/uL (ref 0.0–0.1)
EOS%: 0 % (ref 0.0–7.0)
Eosinophils Absolute: 0 10*3/uL (ref 0.0–0.5)
HCT: 29 % — ABNORMAL LOW (ref 34.8–46.6)
HGB: 9.4 g/dL — ABNORMAL LOW (ref 11.6–15.9)
LYMPH%: 61 % — AB (ref 14.0–49.7)
MCH: 29.5 pg (ref 25.1–34.0)
MCHC: 32.4 g/dL (ref 31.5–36.0)
MCV: 90.9 fL (ref 79.5–101.0)
MONO#: 0.2 10*3/uL (ref 0.1–0.9)
MONO%: 9.1 % (ref 0.0–14.0)
NEUT#: 0.7 10*3/uL — ABNORMAL LOW (ref 1.5–6.5)
NEUT%: 29.9 % — ABNORMAL LOW (ref 38.4–76.8)
PLATELETS: 110 10*3/uL — AB (ref 145–400)
RBC: 3.19 10*6/uL — ABNORMAL LOW (ref 3.70–5.45)
RDW: 19.9 % — AB (ref 11.2–14.5)
WBC: 2.4 10*3/uL — ABNORMAL LOW (ref 3.9–10.3)
lymph#: 1.5 10*3/uL (ref 0.9–3.3)

## 2014-04-20 MED ORDER — SODIUM CHLORIDE 0.9 % IV SOLN
Freq: Once | INTRAVENOUS | Status: DC
  Filled 2014-04-20: qty 500

## 2014-04-20 MED ORDER — DEXAMETHASONE 4 MG PO TABS: 4.0000 mg | ORAL_TABLET | Freq: Two times a day (BID) | ORAL | Status: DC

## 2014-04-20 MED ORDER — LORAZEPAM 0.5 MG PO TABS: 0.5000 mg | ORAL_TABLET | Freq: Four times a day (QID) | ORAL | Status: DC | PRN

## 2014-04-20 MED ORDER — SODIUM CHLORIDE 0.9 % IV SOLN
Freq: Once | INTRAVENOUS | Status: AC
  Administered 2014-04-20: 09:00:00 via INTRAVENOUS
  Filled 2014-04-20: qty 500

## 2014-04-20 NOTE — Assessment & Plan Note (Addendum)
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 AxLN biopsy: ER 70% PR 0% Ki-67 80% HER-2 +2.92, gene copy #5.5 T2, N1, M0 stage IIB  Current treatment:Today is cycle 4 day 8 toxicity check of neoadjuvant chemotherapy with Taxotere, carboplatin, Herceptin and Perjeta.  Chemotherapy toxicities: Patient experienced the following toxicities from chemotherapy 1. Neulasta related bone pain and arthralgias 2. Constipation: Improved 3. Metallic taste in the mouth 4. Fatigue due to chemotherapy: Grade 2 5. Chemotherapy related anemia: Slowly declining now 8.9 today. 6. Hypocalcemia 7. Chemotherapy induced thrombocytopenia: Platelets 111. Chemotherapy dose was reduced with cycle 4 and further dose reduction with cycle 5 Taxotere to 55 mg/m and carboplatin to AUC 4. 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 Return to clinic in 2 weeks for cycle 5 with the dose reduction of chemotherapy.

## 2014-04-20 NOTE — Telephone Encounter (Signed)
, °

## 2014-04-20 NOTE — Patient Instructions (Signed)

## 2014-04-20 NOTE — Progress Notes (Signed)
Patient Care Team: Lavera Guise, MD as PCP - General (Internal Medicine)  DIAGNOSIS: Breast cancer of upper-inner quadrant of left female breast   Staging form: Breast, AJCC 7th Edition     Clinical: Stage IIB (T2, N1, cM0) - Signed by Eppie Gibson, MD on 01/31/2014     Pathologic: cM0 - Unsigned   SUMMARY OF ONCOLOGIC HISTORY: Oncology History   And     Breast cancer of upper-inner quadrant of left female breast   12/27/2013 Mammogram Mammogram and ultrasound 1.8 cm mass left breast 3:00; 7 cm from nipple with abnormal 1 left axillary lymph node, 2.8 x 1.8 x 1.7 cm, second adjacent smaller nodule 1.2 x 1 x 0.7 cm   01/10/2014 Initial Diagnosis 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        1 AxLN biopsy: ER 70% PR 0% Ki-67 80% HER-2 +2.92, gene copy #5.5   02/09/2014 -  Neo-Adjuvant Chemotherapy Neoadjuvant chemotherapy with Taxotere, carboplatin, Herceptin and Perjeta x6 cycles    CHIEF COMPLIANT: Cycle 4 day 8 toxicity check of TCH Perjeta  INTERVAL HISTORY: Regina Deleon is a 63 year old African-American with above-mentioned history of left-sided breast cancer being treated with neoadjuvant chemotherapy. She is tolerating chemotherapy moderately well. She complains of severe fatigue that has been limiting her activities. She does not have much taste today. This is preventing her from eating much food. She has been sleeping quite a lot. During the last chemotherapy she was found to be anemic and thrombocytopenic. Both of these numbers today are stable.  REVIEW OF SYSTEMS:   Constitutional: Denies fevers, chills or abnormal weight loss Eyes: Denies blurriness of vision Ears, nose, mouth, throat, and face: Denies mucositis or sore throat Respiratory: Denies cough, dyspnea or wheezes Cardiovascular: Denies palpitation, chest discomfort or lower extremity swelling Gastrointestinal:  Denies nausea, heartburn or change in bowel habits Skin: Denies  abnormal skin rashes Lymphatics: Denies new lymphadenopathy or easy bruising Neurological:Denies numbness, tingling or new weaknesses Behavioral/Psych: Mood is stable, no new changes  Breast: Breast lumps are shrinking All other systems were reviewed with the patient and are negative.  I have reviewed the past medical history, past surgical history, social history and family history with the patient and they are unchanged from previous note.  ALLERGIES:  is allergic to darvocet; latex; enalapril; and penicillins.  MEDICATIONS:  Current Outpatient Prescriptions  Medication Sig Dispense Refill  . acetaminophen (TYLENOL) 500 MG tablet Take 500 mg by mouth every 6 (six) hours as needed for mild pain.    Marland Kitchen aspirin 81 MG tablet Take 81 mg by mouth every morning.     . cyclobenzaprine (FLEXERIL) 10 MG tablet     . dexamethasone (DECADRON) 4 MG tablet Take 1 tablet (4 mg total) by mouth 2 (two) times daily. Start the day before Taxotere. Then again the day after chemo for 3 days. 30 tablet 1  . fluticasone (FLONASE) 50 MCG/ACT nasal spray Place 2 sprays into both nostrils daily as needed for allergies or rhinitis.    Marland Kitchen HYDROcodone-acetaminophen (NORCO/VICODIN) 5-325 MG per tablet     . lidocaine-prilocaine (EMLA) cream Apply 1 application topically as needed. 30 g 0  . LORazepam (ATIVAN) 0.5 MG tablet Take 1 tablet (0.5 mg total) by mouth every 6 (six) hours as needed (Nausea or vomiting). 30 tablet 0  . NIFEdipine (PROCARDIA-XL/ADALAT-CC/NIFEDICAL-XL) 30 MG 24 hr tablet Take 30 mg by mouth 2 (two) times daily.     Marland Kitchen  Omega-3 Fatty Acids (OMEGA 3 PO) Take 2 tablets by mouth every morning.    . ondansetron (ZOFRAN) 8 MG tablet Take 1 tablet (8 mg total) by mouth 2 (two) times daily. Start the day after chemo for 3 days. Then take as needed for nausea or vomiting. 30 tablet 1  . prochlorperazine (COMPAZINE) 10 MG tablet Take 1 tablet (10 mg total) by mouth every 6 (six) hours as needed (Nausea or  vomiting). 30 tablet 1  . ranitidine (ZANTAC) 150 MG tablet Take 150 mg by mouth 2 (two) times daily.    Marland Kitchen triamterene-hydrochlorothiazide (MAXZIDE-25) 37.5-25 MG per tablet Take 0.5 tablets by mouth every morning. As needed.    . zolpidem (AMBIEN) 10 MG tablet Take 10 mg by mouth at bedtime as needed for sleep.     Current Facility-Administered Medications  Medication Dose Route Frequency Provider Last Rate Last Dose  . sodium chloride 0.9 % 500 mL with potassium chloride 10 mEq infusion   Intravenous Once Rulon Eisenmenger, MD       Facility-Administered Medications Ordered in Other Visits  Medication Dose Route Frequency Provider Last Rate Last Dose  . sodium chloride 0.9 % injection 10 mL  10 mL Intracatheter PRN Rulon Eisenmenger, MD   10 mL at 02/09/14 1720    PHYSICAL EXAMINATION: ECOG PERFORMANCE STATUS: 2 - Symptomatic, <50% confined to bed  Filed Vitals:   04/20/14 0816  BP: 130/105  Pulse: 111  Temp: 98.3 F (36.8 C)  Resp: 20   Filed Weights   04/20/14 0816  Weight: 196 lb 12.8 oz (89.268 kg)    GENERAL:alert, no distress and comfortable SKIN: skin color, texture, turgor are normal, no rashes or significant lesions EYES: normal, Conjunctiva are pink and non-injected, sclera clear OROPHARYNX:no exudate, no erythema and lips, buccal mucosa, and tongue normal  NECK: supple, thyroid normal size, non-tender, without nodularity LYMPH:  no palpable lymphadenopathy in the cervical, axillary or inguinal LUNGS: clear to auscultation and percussion with normal breathing effort HEART: regular rate & rhythm and no murmurs and no lower extremity edema ABDOMEN:abdomen soft, non-tender and normal bowel sounds Musculoskeletal:no cyanosis of digits and no clubbing  NEURO: alert & oriented x 3 with fluent speech, no focal motor/sensory deficits  LABORATORY DATA:  I have reviewed the data as listed   Chemistry      Component Value Date/Time   NA 144 04/13/2014 0938   K 3.8  04/13/2014 0938   CO2 23 04/13/2014 0938   BUN 16.1 04/13/2014 0938   CREATININE 0.7 04/13/2014 0938      Component Value Date/Time   CALCIUM 10.5* 04/13/2014 0938   ALKPHOS 83 04/13/2014 0938   AST 14 04/13/2014 0938   ALT 18 04/13/2014 0938   BILITOT 0.32 04/13/2014 0938       Lab Results  Component Value Date   WBC 2.4* 04/20/2014   HGB 9.4* 04/20/2014   HCT 29.0* 04/20/2014   MCV 90.9 04/20/2014   PLT 110* 04/20/2014   NEUTROABS 0.7* 04/20/2014   ASSESSMENT & PLAN:  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 AxLN biopsy: ER 70% PR 0% Ki-67 80% HER-2 +2.92, gene copy #5.5 T2, N1, M0 stage IIB  Current treatment:Today is cycle 4 day 8 toxicity check of neoadjuvant chemotherapy with Taxotere, carboplatin, Herceptin and Perjeta.  Chemotherapy toxicities: Patient experienced the following toxicities from chemotherapy 1.  Neulasta related bone pain and arthralgias 2. Constipation: Improved 3. Metallic taste in the mouth 4. Fatigue due to chemotherapy: Grade 2 5. Chemotherapy related anemia: Slowly declining now 8.9 today. 6. Hypocalcemia 7. Chemotherapy induced thrombocytopenia: Platelets 111. Chemotherapy dose was reduced with cycle 4 and further dose reduction with cycle 5 Taxotere to 55 mg/m and carboplatin to AUC 4. 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 Return to clinic in 2 weeks for cycle 5 with the dose reduction of chemotherapy.  We will give her IV fluids with potassium today. No orders of the defined types were placed in this encounter.   The patient has a good understanding of the overall plan. she agrees with it. She will call with any problems that may develop before her next visit here.   Rulon Eisenmenger, MD 04/20/2014 8:48 AM

## 2014-05-01 ENCOUNTER — Other Ambulatory Visit: Payer: Self-pay

## 2014-05-01 DIAGNOSIS — C50212 Malignant neoplasm of upper-inner quadrant of left female breast: Secondary | ICD-10-CM

## 2014-05-02 ENCOUNTER — Ambulatory Visit (HOSPITAL_BASED_OUTPATIENT_CLINIC_OR_DEPARTMENT_OTHER): Payer: BC Managed Care – PPO | Admitting: Hematology and Oncology

## 2014-05-02 ENCOUNTER — Other Ambulatory Visit (HOSPITAL_BASED_OUTPATIENT_CLINIC_OR_DEPARTMENT_OTHER): Payer: BC Managed Care – PPO

## 2014-05-02 ENCOUNTER — Telehealth: Payer: Self-pay | Admitting: *Deleted

## 2014-05-02 ENCOUNTER — Telehealth: Payer: Self-pay | Admitting: Hematology and Oncology

## 2014-05-02 DIAGNOSIS — C50212 Malignant neoplasm of upper-inner quadrant of left female breast: Secondary | ICD-10-CM

## 2014-05-02 DIAGNOSIS — D6959 Other secondary thrombocytopenia: Secondary | ICD-10-CM

## 2014-05-02 DIAGNOSIS — D6481 Anemia due to antineoplastic chemotherapy: Secondary | ICD-10-CM

## 2014-05-02 DIAGNOSIS — C773 Secondary and unspecified malignant neoplasm of axilla and upper limb lymph nodes: Secondary | ICD-10-CM

## 2014-05-02 DIAGNOSIS — Z170 Estrogen receptor positive status [ER+]: Secondary | ICD-10-CM

## 2014-05-02 LAB — CBC WITH DIFFERENTIAL/PLATELET
BASO%: 0.1 % (ref 0.0–2.0)
BASOS ABS: 0 10*3/uL (ref 0.0–0.1)
EOS%: 0 % (ref 0.0–7.0)
Eosinophils Absolute: 0 10*3/uL (ref 0.0–0.5)
HCT: 28.1 % — ABNORMAL LOW (ref 34.8–46.6)
HEMOGLOBIN: 8.9 g/dL — AB (ref 11.6–15.9)
LYMPH%: 26.8 % (ref 14.0–49.7)
MCH: 30.4 pg (ref 25.1–34.0)
MCHC: 31.7 g/dL (ref 31.5–36.0)
MCV: 95.9 fL (ref 79.5–101.0)
MONO#: 0.7 10*3/uL (ref 0.1–0.9)
MONO%: 9.3 % (ref 0.0–14.0)
NEUT%: 63.8 % (ref 38.4–76.8)
NEUTROS ABS: 5.1 10*3/uL (ref 1.5–6.5)
Platelets: 154 10*3/uL (ref 145–400)
RBC: 2.93 10*6/uL — ABNORMAL LOW (ref 3.70–5.45)
RDW: 23.4 % — AB (ref 11.2–14.5)
WBC: 8 10*3/uL (ref 3.9–10.3)
lymph#: 2.1 10*3/uL (ref 0.9–3.3)

## 2014-05-02 LAB — COMPREHENSIVE METABOLIC PANEL (CC13)
ALBUMIN: 3.5 g/dL (ref 3.5–5.0)
ALT: 19 U/L (ref 0–55)
AST: 19 U/L (ref 5–34)
Alkaline Phosphatase: 70 U/L (ref 40–150)
Anion Gap: 7 mEq/L (ref 3–11)
BUN: 11.3 mg/dL (ref 7.0–26.0)
CO2: 30 mEq/L — ABNORMAL HIGH (ref 22–29)
CREATININE: 0.7 mg/dL (ref 0.6–1.1)
Calcium: 10 mg/dL (ref 8.4–10.4)
Chloride: 107 mEq/L (ref 98–109)
GLUCOSE: 99 mg/dL (ref 70–140)
Potassium: 3.6 mEq/L (ref 3.5–5.1)
Sodium: 144 mEq/L (ref 136–145)
Total Bilirubin: 0.4 mg/dL (ref 0.20–1.20)
Total Protein: 6.4 g/dL (ref 6.4–8.3)

## 2014-05-02 NOTE — Telephone Encounter (Signed)
Per staff message and POF I have scheduled appts. Advised scheduler of appts. JMW  

## 2014-05-02 NOTE — Progress Notes (Signed)
Patient Care Team: Lavera Guise, MD as PCP - General (Internal Medicine)  DIAGNOSIS: Breast cancer of upper-inner quadrant of left female breast   Staging form: Breast, AJCC 7th Edition     Clinical: Stage IIB (T2, N1, cM0) - Signed by Eppie Gibson, MD on 01/31/2014     Pathologic: cM0 - Unsigned   SUMMARY OF ONCOLOGIC HISTORY: Oncology History   And     Breast cancer of upper-inner quadrant of left female breast   12/27/2013 Mammogram Mammogram and ultrasound 1.8 cm mass left breast 3:00; 7 cm from nipple with abnormal 1 left axillary lymph node, 2.8 x 1.8 x 1.7 cm, second adjacent smaller nodule 1.2 x 1 x 0.7 cm   01/10/2014 Initial Diagnosis 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        1 AxLN biopsy: ER 70% PR 0% Ki-67 80% HER-2 +2.92, gene copy #5.5   02/09/2014 -  Neo-Adjuvant Chemotherapy Neoadjuvant chemotherapy with Taxotere, carboplatin, Herceptin and Perjeta x6 cycles    CHIEF COMPLIANT: tomorrow cycle 5 of [place] Perjeta  INTERVAL HISTORY: Regina Deleon is a 63 year old lady with above-mentioned history of left breast cancer currently on neoadjuvant chemotherapy with [facility] for her. Tomorrow is cycle #5. She is complaining of more more fatigue and weakness. She had leg pains for which she is not using the compression stocking on her pains have gone away. Denies any fevers or chills. She has very low energy levels and tends to sleep a lot. Denies any nausea vomiting has excellent appetite.  REVIEW OF SYSTEMS:   Constitutional: Denies fevers, chills or abnormal weight loss Eyes: Denies blurriness of vision Ears, nose, mouth, throat, and face: Denies mucositis or sore throat Respiratory: Denies cough, dyspnea or wheezes Cardiovascular: Denies palpitation, chest discomfort or lower extremity swelling Gastrointestinal:  Denies nausea, heartburn or change in bowel habits Skin: Denies abnormal skin rashes Lymphatics: Denies new lymphadenopathy or easy  bruising Neurological:Denies numbness, tingling or new weaknesses Behavioral/Psych: Mood is stable, no new changes  Breast:left breast lump has disappeared All other systems were reviewed with the patient and are negative.  I have reviewed the past medical history, past surgical history, social history and family history with the patient and they are unchanged from previous note.  ALLERGIES:  is allergic to darvocet; latex; enalapril; and penicillins.  MEDICATIONS:  Current Outpatient Prescriptions  Medication Sig Dispense Refill  . acetaminophen (TYLENOL) 500 MG tablet Take 500 mg by mouth every 6 (six) hours as needed for mild pain.    Marland Kitchen aspirin 81 MG tablet Take 81 mg by mouth every morning.     . cyclobenzaprine (FLEXERIL) 10 MG tablet     . dexamethasone (DECADRON) 4 MG tablet Take 1 tablet (4 mg total) by mouth 2 (two) times daily. Start the day before Taxotere. Then again the day after chemo for 3 days. 30 tablet 1  . fluticasone (FLONASE) 50 MCG/ACT nasal spray Place 2 sprays into both nostrils daily as needed for allergies or rhinitis.    Marland Kitchen HYDROcodone-acetaminophen (NORCO/VICODIN) 5-325 MG per tablet     . lidocaine-prilocaine (EMLA) cream Apply 1 application topically as needed. 30 g 0  . LORazepam (ATIVAN) 0.5 MG tablet Take 1 tablet (0.5 mg total) by mouth every 6 (six) hours as needed (Nausea or vomiting). 30 tablet 0  . NIFEdipine (PROCARDIA-XL/ADALAT-CC/NIFEDICAL-XL) 30 MG 24 hr tablet Take 30 mg by mouth 2 (two) times daily.     Marland Kitchen  Omega-3 Fatty Acids (OMEGA 3 PO) Take 2 tablets by mouth every morning.    . ondansetron (ZOFRAN) 8 MG tablet Take 1 tablet (8 mg total) by mouth 2 (two) times daily. Start the day after chemo for 3 days. Then take as needed for nausea or vomiting. 30 tablet 1  . prochlorperazine (COMPAZINE) 10 MG tablet Take 1 tablet (10 mg total) by mouth every 6 (six) hours as needed (Nausea or vomiting). 30 tablet 1  . ranitidine (ZANTAC) 150 MG tablet Take 150  mg by mouth 2 (two) times daily.    Marland Kitchen triamterene-hydrochlorothiazide (MAXZIDE-25) 37.5-25 MG per tablet Take 0.5 tablets by mouth every morning. As needed.    . zolpidem (AMBIEN) 10 MG tablet Take 10 mg by mouth at bedtime as needed for sleep.     No current facility-administered medications for this visit.   Facility-Administered Medications Ordered in Other Visits  Medication Dose Route Frequency Provider Last Rate Last Dose  . sodium chloride 0.9 % injection 10 mL  10 mL Intracatheter PRN Rulon Eisenmenger, MD   10 mL at 02/09/14 1720    PHYSICAL EXAMINATION: ECOG PERFORMANCE STATUS: 2 - Symptomatic, <50% confined to bed  There were no vitals filed for this visit. There were no vitals filed for this visit.  GENERAL:alert, no distress and comfortable SKIN: skin color, texture, turgor are normal, no rashes or significant lesions EYES: normal, Conjunctiva are pink and non-injected, sclera clear OROPHARYNX:no exudate, no erythema and lips, buccal mucosa, and tongue normal  NECK: supple, thyroid normal size, non-tender, without nodularity LYMPH:  no palpable lymphadenopathy in the cervical, axillary or inguinal LUNGS: clear to auscultation and percussion with normal breathing effort HEART: regular rate & rhythm and no murmurs and no lower extremity edema ABDOMEN:abdomen soft, non-tender and normal bowel sounds Musculoskeletal:no cyanosis of digits and no clubbing  NEURO: alert & oriented x 3 with fluent speech, no focal motor/sensory deficits BREAST:no palpable mass in the left breast suggesting good response to chemotherapy.  LABORATORY DATA:  I have reviewed the data as listed   Chemistry      Component Value Date/Time   NA 140 04/20/2014 0802   K 3.3* 04/20/2014 0802   CO2 26 04/20/2014 0802   BUN 10.4 04/20/2014 0802   CREATININE 0.7 04/20/2014 0802      Component Value Date/Time   CALCIUM 9.8 04/20/2014 0802   ALKPHOS 86 04/20/2014 0802   AST 16 04/20/2014 0802   ALT 27  04/20/2014 0802   BILITOT 0.55 04/20/2014 0802       Lab Results  Component Value Date   WBC 8.0 05/02/2014   HGB 8.9* 05/02/2014   HCT 28.1* 05/02/2014   MCV 95.9 05/02/2014   PLT 154 05/02/2014   NEUTROABS 5.1 05/02/2014     ASSESSMENT & PLAN:  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 AxLN biopsy: ER 70% PR 0% Ki-67 80% HER-2 +2.92, gene copy #5.5 T2, N1, M0 stage IIB  Current treatment: Tomorrow is cycle 5 day 1 of neoadjuvant chemotherapy with Taxotere, carboplatin, Herceptin and Perjeta.  Chemotherapy toxicities: Patient experienced the following toxicities from chemotherapy 1. Neulasta related bone pain and arthralgias 2. Constipation: Improved 3. Metallic taste in the mouth 4. Fatigue due to chemotherapy: Grade 2 5. Chemotherapy related anemia: Slowly declining. 6. Hypocalcemia 7. Chemotherapy induced thrombocytopenia: Platelets 111. Chemotherapy dose was reduced with cycle  4 and further dose reduction with cycle 5 Taxotere to 55 mg/m and carboplatin to AUC 4. 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 Return to clinic in 3 weeks for cycle 6  After cycle 6 she will have a breast MRI and follow-up with surgery  Orders Placed This Encounter  Procedures  . CBC with Differential    Standing Status: Future     Number of Occurrences:      Standing Expiration Date: 05/02/2015  . Comprehensive metabolic panel (Cmet) - [facility]    Standing Status: Future     Number of Occurrences:      Standing Expiration Date: 05/02/2015   The patient has a good understanding of the overall plan. she agrees with it. She will call with any problems that may develop before her next visit here.   Rulon Eisenmenger, MD 05/02/2014 10:19 AM

## 2014-05-02 NOTE — Telephone Encounter (Signed)
per pof to sch pt appt-gave pt copy of sch °

## 2014-05-02 NOTE — Assessment & Plan Note (Addendum)
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 AxLN biopsy: ER 70% PR 0% Ki-67 80% HER-2 +2.92, gene copy #5.5 T2, N1, M0 stage IIB  Current treatment: Tomorrow is cycle 5 day 1 of neoadjuvant chemotherapy with Taxotere, carboplatin, Herceptin and Perjeta.  Chemotherapy toxicities: Patient experienced the following toxicities from chemotherapy 1. Neulasta related bone pain and arthralgias 2. Constipation: Improved 3. Metallic taste in the mouth 4. Fatigue due to chemotherapy: Grade 2 5. Chemotherapy related anemia: Slowly declining. 6. Hypocalcemia 7. Chemotherapy induced thrombocytopenia: Platelets 111. Chemotherapy dose was reduced with cycle 4 and further dose reduction with cycle 5 Taxotere to 55 mg/m and carboplatin to AUC 4. 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 Return to clinic in 3 weeks for cycle 6

## 2014-05-03 ENCOUNTER — Ambulatory Visit (HOSPITAL_BASED_OUTPATIENT_CLINIC_OR_DEPARTMENT_OTHER): Payer: BC Managed Care – PPO

## 2014-05-03 ENCOUNTER — Other Ambulatory Visit: Payer: BC Managed Care – PPO

## 2014-05-03 ENCOUNTER — Other Ambulatory Visit: Payer: Self-pay

## 2014-05-03 DIAGNOSIS — C50512 Malignant neoplasm of lower-outer quadrant of left female breast: Secondary | ICD-10-CM

## 2014-05-03 DIAGNOSIS — C50212 Malignant neoplasm of upper-inner quadrant of left female breast: Secondary | ICD-10-CM

## 2014-05-03 DIAGNOSIS — Z5111 Encounter for antineoplastic chemotherapy: Secondary | ICD-10-CM

## 2014-05-03 DIAGNOSIS — C50412 Malignant neoplasm of upper-outer quadrant of left female breast: Secondary | ICD-10-CM

## 2014-05-03 MED ORDER — DEXAMETHASONE SODIUM PHOSPHATE 20 MG/5ML IJ SOLN
20.0000 mg | Freq: Once | INTRAMUSCULAR | Status: AC
  Administered 2014-05-03: 20 mg via INTRAVENOUS

## 2014-05-03 MED ORDER — ACETAMINOPHEN 325 MG PO TABS
650.0000 mg | ORAL_TABLET | Freq: Once | ORAL | Status: AC
  Administered 2014-05-03: 650 mg via ORAL

## 2014-05-03 MED ORDER — ACETAMINOPHEN 325 MG PO TABS
ORAL_TABLET | ORAL | Status: AC
  Filled 2014-05-03: qty 2

## 2014-05-03 MED ORDER — SODIUM CHLORIDE 0.9 % IV SOLN
420.0000 mg | Freq: Once | INTRAVENOUS | Status: AC
  Administered 2014-05-03: 420 mg via INTRAVENOUS
  Filled 2014-05-03: qty 14

## 2014-05-03 MED ORDER — HEPARIN SOD (PORK) LOCK FLUSH 100 UNIT/ML IV SOLN
500.0000 [IU] | Freq: Once | INTRAVENOUS | Status: AC | PRN
  Administered 2014-05-03: 500 [IU]
  Filled 2014-05-03: qty 5

## 2014-05-03 MED ORDER — SODIUM CHLORIDE 0.9 % IV SOLN
Freq: Once | INTRAVENOUS | Status: AC
  Administered 2014-05-03: 09:00:00 via INTRAVENOUS

## 2014-05-03 MED ORDER — DOCETAXEL CHEMO INJECTION 160 MG/16ML
55.0000 mg/m2 | Freq: Once | INTRAVENOUS | Status: AC
  Administered 2014-05-03: 110 mg via INTRAVENOUS
  Filled 2014-05-03: qty 11

## 2014-05-03 MED ORDER — DIPHENHYDRAMINE HCL 25 MG PO CAPS
ORAL_CAPSULE | ORAL | Status: AC
  Filled 2014-05-03: qty 2

## 2014-05-03 MED ORDER — ONDANSETRON 16 MG/50ML IVPB (CHCC)
INTRAVENOUS | Status: AC
Start: 2014-05-03 — End: 2014-05-03
  Filled 2014-05-03: qty 16

## 2014-05-03 MED ORDER — ONDANSETRON 16 MG/50ML IVPB (CHCC)
16.0000 mg | Freq: Once | INTRAVENOUS | Status: AC
  Administered 2014-05-03: 16 mg via INTRAVENOUS

## 2014-05-03 MED ORDER — SODIUM CHLORIDE 0.9 % IV SOLN
517.6000 mg | Freq: Once | INTRAVENOUS | Status: AC
  Administered 2014-05-03: 520 mg via INTRAVENOUS
  Filled 2014-05-03: qty 52

## 2014-05-03 MED ORDER — SODIUM CHLORIDE 0.9 % IJ SOLN
10.0000 mL | INTRAMUSCULAR | Status: DC | PRN
  Administered 2014-05-03: 10 mL
  Filled 2014-05-03: qty 10

## 2014-05-03 MED ORDER — DEXAMETHASONE SODIUM PHOSPHATE 20 MG/5ML IJ SOLN
INTRAMUSCULAR | Status: AC
  Filled 2014-05-03: qty 5

## 2014-05-03 MED ORDER — DIPHENHYDRAMINE HCL 25 MG PO CAPS
50.0000 mg | ORAL_CAPSULE | Freq: Once | ORAL | Status: AC
  Administered 2014-05-03: 50 mg via ORAL

## 2014-05-03 MED ORDER — TRASTUZUMAB CHEMO INJECTION 440 MG
6.0000 mg/kg | Freq: Once | INTRAVENOUS | Status: AC
  Administered 2014-05-03: 546 mg via INTRAVENOUS
  Filled 2014-05-03: qty 26

## 2014-05-03 NOTE — Patient Instructions (Signed)
[facility] Discharge Instructions for Patients Receiving Chemotherapy  Today you received the following chemotherapy agents taxotere/carboplatin/herceptin/perjeta  To help prevent nausea and vomiting after your treatment, we encourage you to take your nausea medication as directed   If you develop nausea and vomiting that is not controlled by your nausea medication, call the clinic.   BELOW ARE SYMPTOMS THAT SHOULD BE REPORTED IMMEDIATELY:  *FEVER GREATER THAN 100.5 F  *CHILLS WITH OR WITHOUT FEVER  NAUSEA AND VOMITING THAT IS NOT CONTROLLED WITH YOUR NAUSEA MEDICATION  *UNUSUAL SHORTNESS OF BREATH  *UNUSUAL BRUISING OR BLEEDING  TENDERNESS IN MOUTH AND THROAT WITH OR WITHOUT PRESENCE OF ULCERS  *URINARY PROBLEMS  *BOWEL PROBLEMS  UNUSUAL RASH Items with * indicate a potential emergency and should be followed up as soon as possible.  Feel free to call the clinic you have any questions or concerns. The clinic phone number is [phone].

## 2014-05-04 HISTORY — PX: MASTECTOMY: SHX3

## 2014-05-05 ENCOUNTER — Ambulatory Visit (HOSPITAL_BASED_OUTPATIENT_CLINIC_OR_DEPARTMENT_OTHER): Payer: BC Managed Care – PPO

## 2014-05-05 DIAGNOSIS — C50212 Malignant neoplasm of upper-inner quadrant of left female breast: Secondary | ICD-10-CM

## 2014-05-05 DIAGNOSIS — Z5189 Encounter for other specified aftercare: Secondary | ICD-10-CM

## 2014-05-05 MED ORDER — PEGFILGRASTIM INJECTION 6 MG/0.6ML ~~LOC~~
6.0000 mg | PREFILLED_SYRINGE | Freq: Once | SUBCUTANEOUS | Status: AC
Start: 2014-05-05 — End: 2014-05-05
  Administered 2014-05-05: 6 mg via SUBCUTANEOUS

## 2014-05-05 NOTE — Patient Instructions (Signed)
Pegfilgrastim injection What is this medicine? PEGFILGRASTIM (peg fil GRA stim) is a long-acting granulocyte colony-stimulating factor that stimulates the growth of neutrophils, a type of white blood cell important in the body's fight against infection. It is used to reduce the incidence of fever and infection in patients with certain types of cancer who are receiving chemotherapy that affects the bone marrow. This medicine may be used for other purposes; ask your health care provider or pharmacist if you have questions. COMMON BRAND NAME(S): Neulasta What should I tell my health care provider before I take this medicine? They need to know if you have any of these conditions: -latex allergy -ongoing radiation therapy -sickle cell disease -skin reactions to acrylic adhesives (On-Body Injector only) -an unusual or allergic reaction to pegfilgrastim, filgrastim, other medicines, foods, dyes, or preservatives -pregnant or trying to get pregnant -breast-feeding How should I use this medicine? This medicine is for injection under the skin. If you get this medicine at home, you will be taught how to prepare and give the pre-filled syringe or how to use the On-body Injector. Refer to the patient Instructions for Use for detailed instructions. Use exactly as directed. Take your medicine at regular intervals. Do not take your medicine more often than directed. It is important that you put your used needles and syringes in a special sharps container. Do not put them in a trash can. If you do not have a sharps container, call your pharmacist or healthcare provider to get one. Talk to your pediatrician regarding the use of this medicine in children. Special care may be needed. Overdosage: If you think you have taken too much of this medicine contact a poison control center or emergency room at once. NOTE: This medicine is only for you. Do not share this medicine with others. What if I miss a dose? It is  important not to miss your dose. Call your doctor or health care professional if you miss your dose. If you miss a dose due to an On-body Injector failure or leakage, a new dose should be administered as soon as possible using a single prefilled syringe for manual use. What may interact with this medicine? Interactions have not been studied. Give your health care provider a list of all the medicines, herbs, non-prescription drugs, or dietary supplements you use. Also tell them if you smoke, drink alcohol, or use illegal drugs. Some items may interact with your medicine. This list may not describe all possible interactions. Give your health care provider a list of all the medicines, herbs, non-prescription drugs, or dietary supplements you use. Also tell them if you smoke, drink alcohol, or use illegal drugs. Some items may interact with your medicine. What should I watch for while using this medicine? You may need blood work done while you are taking this medicine. If you are going to need a MRI, CT scan, or other procedure, tell your doctor that you are using this medicine (On-Body Injector only). What side effects may I notice from receiving this medicine? Side effects that you should report to your doctor or health care professional as soon as possible: -allergic reactions like skin rash, itching or hives, swelling of the face, lips, or tongue -dizziness -fever -pain, redness, or irritation at site where injected -pinpoint red spots on the skin -shortness of breath or breathing problems -stomach or side pain, or pain at the shoulder -swelling -tiredness -trouble passing urine Side effects that usually do not require medical attention (report to your doctor   or health care professional if they continue or are bothersome): -bone pain -muscle pain This list may not describe all possible side effects. Call your doctor for medical advice about side effects. You may report side effects to FDA at  1-800-FDA-1088. Where should I keep my medicine? Keep out of the reach of children. Store pre-filled syringes in a refrigerator between 2 and 8 degrees C (36 and 46 degrees F). Do not freeze. Keep in carton to protect from light. Throw away this medicine if it is left out of the refrigerator for more than 48 hours. Throw away any unused medicine after the expiration date. NOTE: This sheet is a summary. It may not cover all possible information. If you have questions about this medicine, talk to your doctor, pharmacist, or health care provider.  2015, [employer]vier/[employer] Standard. (2013-07-20 16:14:05)  

## 2014-05-11 ENCOUNTER — Telehealth: Payer: Self-pay | Admitting: *Deleted

## 2014-05-11 NOTE — Telephone Encounter (Signed)
05/11/2014 Left message on patient's mobile # voice mail, to inquire if she has read the consent form for the research study and if she has made a decision about whether or not to participate. Requested that patient call me back at [phone] to let me know her decision. Cindy S. Brigitte Pulse BSN, RN, CCRP 05/11/2014 1:14 PM

## 2014-05-15 ENCOUNTER — Telehealth: Payer: Self-pay | Admitting: *Deleted

## 2014-05-15 NOTE — Telephone Encounter (Signed)
05/15/2014 Left message on patient's mobile # voice mail, to inquire if she has made a decision about whether or not to participate in the research study. Requested that patient call me back at [phone] to let me know her decision. Cindy S. Brigitte Pulse BSN, RN, [place] 05/15/2014 10:06 AM

## 2014-05-16 ENCOUNTER — Ambulatory Visit (HOSPITAL_COMMUNITY)
Admission: RE | Admit: 2014-05-16 | Discharge: 2014-05-16 | Disposition: A | Discharge status: Home / Self Care | Payer: BC Managed Care – PPO | Source: Ambulatory Visit | Attending: Hematology and Oncology | Admitting: Hematology and Oncology

## 2014-05-16 ENCOUNTER — Other Ambulatory Visit (HOSPITAL_COMMUNITY): Payer: Self-pay

## 2014-05-16 DIAGNOSIS — Z5111 Encounter for antineoplastic chemotherapy: Secondary | ICD-10-CM

## 2014-05-16 DIAGNOSIS — C50212 Malignant neoplasm of upper-inner quadrant of left female breast: Secondary | ICD-10-CM | POA: Diagnosis not present

## 2014-05-16 NOTE — Progress Notes (Signed)
  Echocardiogram 2D Echocardiogram has been performed.  Darlina Sicilian M 05/16/2014, 12:10 PM

## 2014-05-17 NOTE — Telephone Encounter (Signed)
05/17/2014 No response received to messages left on 1/12 and previously on 1/8. Will follow up with patient at office visits next week. Cindy S. Brigitte Pulse BSN, RN, [place] 05/17/2014 8:28 AM

## 2014-05-23 ENCOUNTER — Telehealth: Payer: Self-pay | Admitting: Hematology and Oncology

## 2014-05-23 NOTE — Telephone Encounter (Signed)
, °

## 2014-05-24 ENCOUNTER — Other Ambulatory Visit (HOSPITAL_BASED_OUTPATIENT_CLINIC_OR_DEPARTMENT_OTHER): Payer: BC Managed Care – PPO

## 2014-05-24 ENCOUNTER — Telehealth: Payer: Self-pay | Admitting: *Deleted

## 2014-05-24 ENCOUNTER — Ambulatory Visit (HOSPITAL_BASED_OUTPATIENT_CLINIC_OR_DEPARTMENT_OTHER): Payer: BC Managed Care – PPO | Admitting: Hematology and Oncology

## 2014-05-24 ENCOUNTER — Telehealth: Payer: Self-pay | Admitting: Hematology and Oncology

## 2014-05-24 ENCOUNTER — Ambulatory Visit (HOSPITAL_BASED_OUTPATIENT_CLINIC_OR_DEPARTMENT_OTHER): Payer: BC Managed Care – PPO

## 2014-05-24 VITALS — BP 149/60 | HR 119 | Temp 97.6°F | Resp 20 | Ht 63.0 in | Wt 203.5 lb

## 2014-05-24 DIAGNOSIS — D6959 Other secondary thrombocytopenia: Secondary | ICD-10-CM

## 2014-05-24 DIAGNOSIS — Z170 Estrogen receptor positive status [ER+]: Secondary | ICD-10-CM

## 2014-05-24 DIAGNOSIS — D6481 Anemia due to antineoplastic chemotherapy: Secondary | ICD-10-CM

## 2014-05-24 DIAGNOSIS — C773 Secondary and unspecified malignant neoplasm of axilla and upper limb lymph nodes: Secondary | ICD-10-CM

## 2014-05-24 DIAGNOSIS — C50212 Malignant neoplasm of upper-inner quadrant of left female breast: Secondary | ICD-10-CM

## 2014-05-24 DIAGNOSIS — Z5112 Encounter for antineoplastic immunotherapy: Secondary | ICD-10-CM

## 2014-05-24 LAB — CBC WITH DIFFERENTIAL/PLATELET
BASO%: 0 % (ref 0.0–2.0)
Basophils Absolute: 0 10*3/uL (ref 0.0–0.1)
EOS%: 0.1 % (ref 0.0–7.0)
Eosinophils Absolute: 0 10*3/uL (ref 0.0–0.5)
HCT: 27.2 % — ABNORMAL LOW (ref 34.8–46.6)
HEMOGLOBIN: 8.6 g/dL — AB (ref 11.6–15.9)
LYMPH%: 12.7 % — AB (ref 14.0–49.7)
MCH: 31.6 pg (ref 25.1–34.0)
MCHC: 31.6 g/dL (ref 31.5–36.0)
MCV: 100 fL (ref 79.5–101.0)
MONO#: 0.4 10*3/uL (ref 0.1–0.9)
MONO%: 3.8 % (ref 0.0–14.0)
NEUT#: 8.8 10*3/uL — ABNORMAL HIGH (ref 1.5–6.5)
NEUT%: 83.4 % — ABNORMAL HIGH (ref 38.4–76.8)
Platelets: 129 10*3/uL — ABNORMAL LOW (ref 145–400)
RBC: 2.72 10*6/uL — ABNORMAL LOW (ref 3.70–5.45)
RDW: 22.2 % — ABNORMAL HIGH (ref 11.2–14.5)
WBC: 10.5 10*3/uL — ABNORMAL HIGH (ref 3.9–10.3)
lymph#: 1.3 10*3/uL (ref 0.9–3.3)

## 2014-05-24 LAB — COMPREHENSIVE METABOLIC PANEL (CC13)
ALT: 16 U/L (ref 0–55)
AST: 17 U/L (ref 5–34)
Albumin: 3.5 g/dL (ref 3.5–5.0)
Alkaline Phosphatase: 68 U/L (ref 40–150)
Anion Gap: 9 mEq/L (ref 3–11)
BUN: 8.6 mg/dL (ref 7.0–26.0)
CALCIUM: 9.8 mg/dL (ref 8.4–10.4)
CHLORIDE: 108 meq/L (ref 98–109)
CO2: 25 meq/L (ref 22–29)
Creatinine: 0.7 mg/dL (ref 0.6–1.1)
EGFR: >90 mL/min/{1.73_m2} (ref >90)
Glucose: 144 mg/dl — ABNORMAL HIGH (ref 70–140)
POTASSIUM: 3.6 meq/L (ref 3.5–5.1)
Sodium: 142 mEq/L (ref 136–145)
Total Bilirubin: 0.46 mg/dL (ref 0.20–1.20)
Total Protein: 6.5 g/dL (ref 6.4–8.3)

## 2014-05-24 MED ORDER — SODIUM CHLORIDE 0.9 % IJ SOLN
10.0000 mL | INTRAMUSCULAR | Status: DC | PRN
  Administered 2014-05-24: 10 mL
  Filled 2014-05-24: qty 10

## 2014-05-24 MED ORDER — HEPARIN SOD (PORK) LOCK FLUSH 100 UNIT/ML IV SOLN
500.0000 [IU] | Freq: Once | INTRAVENOUS | Status: AC | PRN
  Administered 2014-05-24: 500 [IU]
  Filled 2014-05-24: qty 5

## 2014-05-24 MED ORDER — TRASTUZUMAB CHEMO INJECTION 440 MG
6.0000 mg/kg | Freq: Once | INTRAVENOUS | Status: AC
  Administered 2014-05-24: 546 mg via INTRAVENOUS
  Filled 2014-05-24: qty 26

## 2014-05-24 MED ORDER — SODIUM CHLORIDE 0.9 % IV SOLN
Freq: Once | INTRAVENOUS | Status: AC
  Administered 2014-05-24: 09:00:00 via INTRAVENOUS

## 2014-05-24 MED ORDER — ACETAMINOPHEN 325 MG PO TABS
650.0000 mg | ORAL_TABLET | Freq: Once | ORAL | Status: AC
  Administered 2014-05-24: 650 mg via ORAL

## 2014-05-24 MED ORDER — DIPHENHYDRAMINE HCL 25 MG PO CAPS
50.0000 mg | ORAL_CAPSULE | Freq: Once | ORAL | Status: AC
  Administered 2014-05-24: 50 mg via ORAL

## 2014-05-24 MED ORDER — DIPHENHYDRAMINE HCL 25 MG PO CAPS
ORAL_CAPSULE | ORAL | Status: AC
  Filled 2014-05-24: qty 2

## 2014-05-24 MED ORDER — ACETAMINOPHEN 325 MG PO TABS
ORAL_TABLET | ORAL | Status: AC
Start: 2014-05-24 — End: 2014-05-24
  Filled 2014-05-24: qty 2

## 2014-05-24 MED ORDER — PERTUZUMAB CHEMO INJECTION 420 MG/14ML
420.0000 mg | Freq: Once | INTRAVENOUS | Status: AC
  Administered 2014-05-24: 420 mg via INTRAVENOUS
  Filled 2014-05-24: qty 14

## 2014-05-24 NOTE — Assessment & Plan Note (Signed)
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 AxLN biopsy: ER 70% PR 0% Ki-67 80% HER-2 +2.92, gene copy #5.5 T2, N1, M0 stage IIB  Current treatment: Today is cycle 6 day 1 of neoadjuvant chemotherapy with Taxotere, carboplatin, Herceptin and Perjeta. This will be the last chemotherapy. To be followed by Herceptin maintenance every 3 weeks for 1 year  Chemotherapy toxicities: Patient experienced the following toxicities from chemotherapy 1. Neulasta related bone pain and arthralgias 2. Constipation: Improved 3. Metallic taste in the mouth 4. Fatigue due to chemotherapy: Grade 2 5. Chemotherapy related anemia: Slowly declining. 6. Hypocalcemia 7. Chemotherapy induced thrombocytopenia: Platelets 111. Chemotherapy dose was reduced with cycle 4 and further dose reduction with cycle 5 Taxotere to 55 mg/m and carboplatin to AUC 4. 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

## 2014-05-24 NOTE — Progress Notes (Signed)
Patient Care Team: Lavera Guise, MD as PCP - General (Internal Medicine)  DIAGNOSIS: Breast cancer of upper-inner quadrant of left female breast   Staging form: Breast, AJCC 7th Edition     Clinical: Stage IIB (T2, N1, cM0) - Signed by Eppie Gibson, MD on 01/31/2014     Pathologic: cM0 - Unsigned   SUMMARY OF ONCOLOGIC HISTORY: Oncology History   And     Breast cancer of upper-inner quadrant of left female breast   12/27/2013 Mammogram Mammogram and ultrasound 1.8 cm mass left breast 3:00; 7 cm from nipple with abnormal 1 left axillary lymph node, 2.8 x 1.8 x 1.7 cm, second adjacent smaller nodule 1.2 x 1 x 0.7 cm   01/10/2014 Initial Diagnosis 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        1 AxLN biopsy: ER 70% PR 0% Ki-67 80% HER-2 +2.92, gene copy #5.5   02/09/2014 -  Neo-Adjuvant Chemotherapy Neoadjuvant chemotherapy with Taxotere, carboplatin, Herceptin and Perjeta x6 cycles    CHIEF COMPLIANT: cycle 6 of [place] Perjeta  INTERVAL HISTORY: Regina Deleon is a 64 year old lady with above-mentioned history of left breast cancer currently on neoadjuvant chemotherapy with TCH Perjeta. Today is her last treatment. Unfortunately the neuropathy is getting worse as well as her fatigue related to anemia is getting worse. Hence we have decided to discontinue Taxotere and carboplatin. She'll be getting Herceptin Perjeta alone. She is excited about finishing treatment and is ready for the next phase of treatment which will be with Herceptin maintenance.  REVIEW OF SYSTEMS:   Constitutional: Denies fevers, chills or abnormal weight loss Eyes: Denies blurriness of vision Ears, nose, mouth, throat, and face: Denies mucositis or sore throat Respiratory: Denies cough, dyspnea or wheezes Cardiovascular: Denies palpitation, chest discomfort or lower extremity swelling Gastrointestinal:  Denies nausea, heartburn or change in bowel habits Skin: Denies abnormal skin  rashes Lymphatics: Denies new lymphadenopathy or easy bruising Neurological:neuropathy in feet and hands Behavioral/Psych: Mood is stable, no new changes  Breast: lumps have gotten smaller All other systems were reviewed with the patient and are negative.  I have reviewed the past medical history, past surgical history, social history and family history with the patient and they are unchanged from previous note.  ALLERGIES:  is allergic to darvocet; latex; enalapril; and penicillins.  MEDICATIONS:  Current Outpatient Prescriptions  Medication Sig Dispense Refill  . acetaminophen (TYLENOL) 500 MG tablet Take 500 mg by mouth every 6 (six) hours as needed for mild pain.    Marland Kitchen aspirin 81 MG tablet Take 81 mg by mouth every morning.     . cyclobenzaprine (FLEXERIL) 10 MG tablet     . dexamethasone (DECADRON) 4 MG tablet Take 1 tablet (4 mg total) by mouth 2 (two) times daily. Start the day before Taxotere. Then again the day after chemo for 3 days. 30 tablet 1  . fluticasone (FLONASE) 50 MCG/ACT nasal spray Place 2 sprays into both nostrils daily as needed for allergies or rhinitis.    Marland Kitchen HYDROcodone-acetaminophen (NORCO/VICODIN) 5-325 MG per tablet     . lidocaine-prilocaine (EMLA) cream Apply 1 application topically as needed. 30 g 0  . LORazepam (ATIVAN) 0.5 MG tablet Take 1 tablet (0.5 mg total) by mouth every 6 (six) hours as needed (Nausea or vomiting). 30 tablet 0  . NIFEdipine (PROCARDIA-XL/ADALAT-CC/NIFEDICAL-XL) 30 MG 24 hr tablet Take 30 mg by mouth 2 (two) times daily.     Marland Kitchen  Omega-3 Fatty Acids (OMEGA 3 PO) Take 2 tablets by mouth every morning.    . ondansetron (ZOFRAN) 8 MG tablet Take 1 tablet (8 mg total) by mouth 2 (two) times daily. Start the day after chemo for 3 days. Then take as needed for nausea or vomiting. 30 tablet 1  . prochlorperazine (COMPAZINE) 10 MG tablet Take 1 tablet (10 mg total) by mouth every 6 (six) hours as needed (Nausea or vomiting). 30 tablet 1  .  ranitidine (ZANTAC) 150 MG tablet Take 150 mg by mouth 2 (two) times daily.    Marland Kitchen triamterene-hydrochlorothiazide (MAXZIDE-25) 37.5-25 MG per tablet Take 0.5 tablets by mouth every morning. As needed.    . zolpidem (AMBIEN) 10 MG tablet Take 10 mg by mouth at bedtime as needed for sleep.     No current facility-administered medications for this visit.   Facility-Administered Medications Ordered in Other Visits  Medication Dose Route Frequency Provider Last Rate Last Dose  . sodium chloride 0.9 % injection 10 mL  10 mL Intracatheter PRN Rulon Eisenmenger, MD   10 mL at 02/09/14 1720    PHYSICAL EXAMINATION: ECOG PERFORMANCE STATUS: 1 - Symptomatic but completely ambulatory  Filed Vitals:   05/24/14 0827  BP: 149/60  Pulse: 119  Temp: 97.6 F (36.4 C)  Resp: 20   Filed Weights   05/24/14 0827  Weight: 203 lb 8 oz (92.307 kg)    GENERAL:alert, no distress and comfortable SKIN: skin color, texture, turgor are normal, no rashes or significant lesions EYES: normal, Conjunctiva are pink and non-injected, sclera clear OROPHARYNX:no exudate, no erythema and lips, buccal mucosa, and tongue normal  NECK: supple, thyroid normal size, non-tender, without nodularity LYMPH:  no palpable lymphadenopathy in the cervical, axillary or inguinal LUNGS: clear to auscultation and percussion with normal breathing effort HEART: regular rate & rhythm and no murmurs and no lower extremity edema ABDOMEN:abdomen soft, non-tender and normal bowel sounds Musculoskeletal:no cyanosis of digits and no clubbing  NEURO: alert & oriented x 3 with fluent speech, no focal motor/sensory deficits   LABORATORY DATA:  I have reviewed the data as listed   Chemistry      Component Value Date/Time   NA 142 05/24/2014 0813   K 3.6 05/24/2014 0813   CO2 25 05/24/2014 0813   BUN 8.6 05/24/2014 0813   CREATININE 0.7 05/24/2014 0813      Component Value Date/Time   CALCIUM 9.8 05/24/2014 0813   ALKPHOS 68 05/24/2014  0813   AST 17 05/24/2014 0813   ALT 16 05/24/2014 0813   BILITOT 0.46 05/24/2014 0813       Lab Results  Component Value Date   WBC 10.5* 05/24/2014   HGB 8.6* 05/24/2014   HCT 27.2* 05/24/2014   MCV 100.0 05/24/2014   PLT 129* 05/24/2014   NEUTROABS 8.8* 05/24/2014   ASSESSMENT & PLAN:  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 AxLN biopsy: ER 70% PR 0% Ki-67 80% HER-2 +2.92, gene copy #5.5 T2, N1, M0 stage IIB  Current treatment: Today is cycle 6 day 1 of neoadjuvant chemotherapy with Taxotere, carboplatin, Herceptin and Perjeta. This will be the last chemotherapy. To be followed by Herceptin maintenance every 3 weeks for 1 year  Chemotherapy toxicities: Patient experienced the following toxicities from chemotherapy 1. Neulasta related bone pain and arthralgias 2. Constipation: Improved 3. Metallic taste in the mouth  4. Fatigue due to chemotherapy: Grade 2 5. Chemotherapy related anemia: Slowly declining. 6. Hypocalcemia 7. Chemotherapy induced thrombocytopenia: Platelets 111. Chemotherapy dose was reduced with cycle 4 and further dose reduction with cycle 5 Taxotere to 55 mg/m and carboplatin to AUC 4. 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 1. MRI breast to be done from 8 2016 2. Tumor board presentation 06/13/2014 3. Clinic follow-up 06/14/2014 4. [facility]IANCE clinical trial discussion    Orders Placed This Encounter  Procedures  . MR Breast Bilateral W Contrast    Standing Status: Future     Number of Occurrences:      Standing Expiration Date: 05/24/2015    Order Specific Question:  Reason for Exam (SYMPTOM  OR DIAGNOSIS REQUIRED)    Answer:  Post neoadjuvant chemo    Order Specific Question:  Preferred imaging location?    Answer:  [facility]    Order Specific Question:  Does the patient have a pacemaker or implanted devices?    Answer:  No    Order Specific Question:  What is the patient's sedation requirement?    Answer:  No Sedation  . MR Breast Bilateral Wo Contrast    Standing Status: Future     Number of Occurrences:      Standing Expiration Date: 05/24/2015    Order Specific Question:  Reason for Exam (SYMPTOM  OR DIAGNOSIS REQUIRED)    Answer:  Post neoadjuvant chemo    Order Specific Question:  Preferred imaging location?    Answer:  [facility]    Order Specific Question:  Does the patient have a pacemaker or implanted devices?    Answer:  No    Order Specific Question:  What is the patient's sedation requirement?    Answer:  No Sedation  . CBC with Differential    Standing Status: Future     Number of Occurrences:      Standing Expiration Date: 05/24/2015  . Comprehensive metabolic panel (Cmet) - [facility]    Standing Status: Future     Number of Occurrences:      Standing Expiration Date: 05/24/2015   The patient has a good understanding of the overall plan. she agrees with it. She will call with any problems that may develop before her next visit here.   Rulon Eisenmenger, MD   Bertell Maria she was here. Yes Regina Deleon I stopped chemotherapy with adenoids only can be Herceptin portion of the

## 2014-05-24 NOTE — Telephone Encounter (Signed)
, °

## 2014-05-24 NOTE — Patient Instructions (Signed)
[facility] Discharge Instructions for Patients Receiving Chemotherapy  Today you received the following chemotherapy agents Herceptin, Perjeta  To help prevent nausea and vomiting after your treatment, we encourage you to take your nausea medication as directed/prescribed   If you develop nausea and vomiting that is not controlled by your nausea medication, call the clinic.   BELOW ARE SYMPTOMS THAT SHOULD BE REPORTED IMMEDIATELY:  *FEVER GREATER THAN 100.5 F  *CHILLS WITH OR WITHOUT FEVER  NAUSEA AND VOMITING THAT IS NOT CONTROLLED WITH YOUR NAUSEA MEDICATION  *UNUSUAL SHORTNESS OF BREATH  *UNUSUAL BRUISING OR BLEEDING  TENDERNESS IN MOUTH AND THROAT WITH OR WITHOUT PRESENCE OF ULCERS  *URINARY PROBLEMS  *BOWEL PROBLEMS  UNUSUAL RASH Items with * indicate a potential emergency and should be followed up as soon as possible.  Feel free to call the clinic you have any questions or concerns. The clinic phone number is [phone].

## 2014-05-24 NOTE — Telephone Encounter (Signed)
Per staff message and POF I have scheduled appts. Advised scheduler of appts. JMW  

## 2014-05-25 ENCOUNTER — Ambulatory Visit: Payer: BC Managed Care – PPO

## 2014-05-29 ENCOUNTER — Telehealth: Payer: Self-pay | Admitting: *Deleted

## 2014-05-29 DIAGNOSIS — M7989 Other specified soft tissue disorders: Secondary | ICD-10-CM

## 2014-05-29 DIAGNOSIS — C50212 Malignant neoplasm of upper-inner quadrant of left female breast: Secondary | ICD-10-CM

## 2014-05-29 MED ORDER — FUROSEMIDE 20 MG PO TABS: 20.0000 mg | ORAL_TABLET | Freq: Every day | ORAL | Status: DC

## 2014-05-29 NOTE — Telephone Encounter (Signed)
Received message from [place], RN in triage in regards to patient's phone call from this morning.  Reviewed with Dr. Lindi Adie. Order placed for lasix 20mg  tablets daily. Called patient and let her know that prescription has been sent to her pharmacy and that Dr. Lindi Adie would like her to take it daily until she is seen again on 06/14/14. Told patient that if swelling continues to increase after starting the lasix or if she develops any new symptoms to please give [place] a call. Patient agreeable to this. Confirmed appts for 06/14/14 while on the phone.

## 2014-05-29 NOTE — Telephone Encounter (Signed)
Call in to [place] from pt stating onset of numbness in feet and then pm last night " it felt like I was wearing a tight pair of shoes "  Above discomfort was constant. Both feet were affected " just like I couldn't get shoes off that were too tight"  Pt states she took an 81mg  ASA " because I thought maybe it was circulation " and obtained mild benefit.  She also noted bilateral swelling in feet- right greater then left which was less upon getting out of bed this am.  Pt is concerned due to present symptoms and " getting worse as day goes on "  " could the doctor call something in to help with the above ?"  Return call [phone]   THIS CALL WILL BE FORWARDED TO MD AND RN AT [facility].

## 2014-06-04 HISTORY — PX: BREAST SURGERY: SHX581

## 2014-06-06 ENCOUNTER — Ambulatory Visit
Admission: RE | Admit: 2014-06-06 | Discharge: 2014-06-06 | Disposition: A | Discharge status: Home / Self Care | Payer: BC Managed Care – PPO | Source: Ambulatory Visit | Attending: Hematology and Oncology | Admitting: Hematology and Oncology

## 2014-06-06 DIAGNOSIS — C50212 Malignant neoplasm of upper-inner quadrant of left female breast: Secondary | ICD-10-CM

## 2014-06-06 MED ORDER — GADOBENATE DIMEGLUMINE 529 MG/ML IV SOLN
20.0000 mL | Freq: Once | INTRAVENOUS | Status: AC | PRN
  Administered 2014-06-06: 20 mL via INTRAVENOUS

## 2014-06-07 ENCOUNTER — Telehealth: Payer: Self-pay | Admitting: *Deleted

## 2014-06-07 NOTE — Telephone Encounter (Signed)
IF OK WITH DR.GUDENA PT. WOULD LIKE DR.DAVID NEWMAN AND DR.SQUIRES TO HAVE COPIES OF MRI SENT TO THEM.

## 2014-06-08 ENCOUNTER — Other Ambulatory Visit (INDEPENDENT_AMBULATORY_CARE_PROVIDER_SITE_OTHER): Payer: Self-pay | Admitting: Surgery

## 2014-06-14 ENCOUNTER — Telehealth: Payer: Self-pay | Admitting: Hematology and Oncology

## 2014-06-14 ENCOUNTER — Ambulatory Visit (HOSPITAL_BASED_OUTPATIENT_CLINIC_OR_DEPARTMENT_OTHER): Payer: BC Managed Care – PPO | Admitting: Hematology and Oncology

## 2014-06-14 ENCOUNTER — Ambulatory Visit (HOSPITAL_BASED_OUTPATIENT_CLINIC_OR_DEPARTMENT_OTHER): Payer: BC Managed Care – PPO

## 2014-06-14 ENCOUNTER — Other Ambulatory Visit (HOSPITAL_BASED_OUTPATIENT_CLINIC_OR_DEPARTMENT_OTHER): Payer: BC Managed Care – PPO

## 2014-06-14 ENCOUNTER — Telehealth: Payer: Self-pay | Admitting: *Deleted

## 2014-06-14 ENCOUNTER — Other Ambulatory Visit: Payer: Self-pay | Admitting: *Deleted

## 2014-06-14 ENCOUNTER — Other Ambulatory Visit: Payer: Self-pay | Admitting: Hematology and Oncology

## 2014-06-14 ENCOUNTER — Telehealth: Payer: Self-pay

## 2014-06-14 VITALS — BP 154/70 | HR 95 | Temp 97.5°F | Resp 20 | Ht 63.0 in | Wt 202.4 lb

## 2014-06-14 DIAGNOSIS — Z5112 Encounter for antineoplastic immunotherapy: Secondary | ICD-10-CM

## 2014-06-14 DIAGNOSIS — C50412 Malignant neoplasm of upper-outer quadrant of left female breast: Secondary | ICD-10-CM

## 2014-06-14 DIAGNOSIS — C50512 Malignant neoplasm of lower-outer quadrant of left female breast: Secondary | ICD-10-CM

## 2014-06-14 DIAGNOSIS — R530 Neoplastic (malignant) related fatigue: Secondary | ICD-10-CM

## 2014-06-14 DIAGNOSIS — M899 Disorder of bone, unspecified: Secondary | ICD-10-CM

## 2014-06-14 DIAGNOSIS — D6481 Anemia due to antineoplastic chemotherapy: Secondary | ICD-10-CM

## 2014-06-14 DIAGNOSIS — C50212 Malignant neoplasm of upper-inner quadrant of left female breast: Secondary | ICD-10-CM

## 2014-06-14 DIAGNOSIS — G620 Drug-induced polyneuropathy: Secondary | ICD-10-CM

## 2014-06-14 DIAGNOSIS — D6959 Other secondary thrombocytopenia: Secondary | ICD-10-CM

## 2014-06-14 DIAGNOSIS — Z170 Estrogen receptor positive status [ER+]: Secondary | ICD-10-CM

## 2014-06-14 LAB — COMPREHENSIVE METABOLIC PANEL (CC13)
ALK PHOS: 78 U/L (ref 40–150)
ALT: 13 U/L (ref 0–55)
AST: 18 U/L (ref 5–34)
Albumin: 3.6 g/dL (ref 3.5–5.0)
Anion Gap: 8 mEq/L (ref 3–11)
BILIRUBIN TOTAL: 0.32 mg/dL (ref 0.20–1.20)
BUN: 8 mg/dL (ref 7.0–26.0)
CHLORIDE: 108 meq/L (ref 98–109)
CO2: 28 meq/L (ref 22–29)
Calcium: 9.9 mg/dL (ref 8.4–10.4)
Creatinine: 0.7 mg/dL (ref 0.6–1.1)
EGFR: >90 mL/min/{1.73_m2} (ref >90)
GLUCOSE: 115 mg/dL (ref 70–140)
Potassium: 3.4 mEq/L — ABNORMAL LOW (ref 3.5–5.1)
SODIUM: 144 meq/L (ref 136–145)
Total Protein: 6.7 g/dL (ref 6.4–8.3)

## 2014-06-14 LAB — CBC WITH DIFFERENTIAL/PLATELET
BASO%: 0.5 % (ref 0.0–2.0)
Basophils Absolute: 0 10*3/uL (ref 0.0–0.1)
EOS%: 1.2 % (ref 0.0–7.0)
Eosinophils Absolute: 0.1 10*3/uL (ref 0.0–0.5)
HCT: 30.2 % — ABNORMAL LOW (ref 34.8–46.6)
HGB: 9.4 g/dL — ABNORMAL LOW (ref 11.6–15.9)
LYMPH%: 29.1 % (ref 14.0–49.7)
MCH: 31.4 pg (ref 25.1–34.0)
MCHC: 31.1 g/dL — AB (ref 31.5–36.0)
MCV: 101 fL (ref 79.5–101.0)
MONO#: 0.4 10*3/uL (ref 0.1–0.9)
MONO%: 6.3 % (ref 0.0–14.0)
NEUT#: 3.5 10*3/uL (ref 1.5–6.5)
NEUT%: 62.9 % (ref 38.4–76.8)
Platelets: 360 10*3/uL (ref 145–400)
RBC: 2.99 10*6/uL — ABNORMAL LOW (ref 3.70–5.45)
RDW: 17 % — AB (ref 11.2–14.5)
WBC: 5.6 10*3/uL (ref 3.9–10.3)
lymph#: 1.6 10*3/uL (ref 0.9–3.3)

## 2014-06-14 MED ORDER — DIPHENHYDRAMINE HCL 25 MG PO CAPS
ORAL_CAPSULE | ORAL | Status: AC
  Filled 2014-06-14: qty 2

## 2014-06-14 MED ORDER — HEPARIN SOD (PORK) LOCK FLUSH 100 UNIT/ML IV SOLN
500.0000 [IU] | Freq: Once | INTRAVENOUS | Status: AC | PRN
  Administered 2014-06-14: 500 [IU]
  Filled 2014-06-14: qty 5

## 2014-06-14 MED ORDER — TRIAMCINOLONE ACETONIDE 0.5 % EX OINT: 1.0000 "application " | TOPICAL_OINTMENT | Freq: Two times a day (BID) | CUTANEOUS | Status: DC

## 2014-06-14 MED ORDER — SODIUM CHLORIDE 0.9 % IJ SOLN
10.0000 mL | INTRAMUSCULAR | Status: DC | PRN
  Administered 2014-06-14: 10 mL
  Filled 2014-06-14: qty 10

## 2014-06-14 MED ORDER — DIPHENHYDRAMINE HCL 25 MG PO CAPS
50.0000 mg | ORAL_CAPSULE | Freq: Once | ORAL | Status: AC
  Administered 2014-06-14: 50 mg via ORAL

## 2014-06-14 MED ORDER — ACETAMINOPHEN 325 MG PO TABS
ORAL_TABLET | ORAL | Status: AC
  Filled 2014-06-14: qty 2

## 2014-06-14 MED ORDER — TRASTUZUMAB CHEMO INJECTION 440 MG
6.0000 mg/kg | Freq: Once | INTRAVENOUS | Status: AC
  Administered 2014-06-14: 546 mg via INTRAVENOUS
  Filled 2014-06-14: qty 26

## 2014-06-14 MED ORDER — ACETAMINOPHEN 325 MG PO TABS
650.0000 mg | ORAL_TABLET | Freq: Once | ORAL | Status: AC
  Administered 2014-06-14: 650 mg via ORAL

## 2014-06-14 MED ORDER — SODIUM CHLORIDE 0.9 % IV SOLN
Freq: Once | INTRAVENOUS | Status: AC
  Administered 2014-06-14: 11:00:00 via INTRAVENOUS

## 2014-06-14 NOTE — Telephone Encounter (Signed)
Per staff message and POF I have scheduled appts. Advised scheduler of appts. JMW  

## 2014-06-14 NOTE — Patient Instructions (Signed)
[facility] Discharge Instructions for Patients Receiving Chemotherapy  Today you received the following chemotherapy agents Herceptin  To help prevent nausea and vomiting after your treatment, we encourage you to take your nausea medication     If you develop nausea and vomiting that is not controlled by your nausea medication, call the clinic.   BELOW ARE SYMPTOMS THAT SHOULD BE REPORTED IMMEDIATELY:  *FEVER GREATER THAN 100.5 F  *CHILLS WITH OR WITHOUT FEVER  NAUSEA AND VOMITING THAT IS NOT CONTROLLED WITH YOUR NAUSEA MEDICATION  *UNUSUAL SHORTNESS OF BREATH  *UNUSUAL BRUISING OR BLEEDING  TENDERNESS IN MOUTH AND THROAT WITH OR WITHOUT PRESENCE OF ULCERS  *URINARY PROBLEMS  *BOWEL PROBLEMS  UNUSUAL RASH Items with * indicate a potential emergency and should be followed up as soon as possible.  Feel free to call the clinic you have any questions or concerns. The clinic phone number is [phone].    

## 2014-06-14 NOTE — Telephone Encounter (Signed)
rx for triamcinolone sent to [facility]

## 2014-06-14 NOTE — Progress Notes (Signed)
Patient Care Team: Lavera Guise, MD as PCP - General (Internal Medicine)  DIAGNOSIS: Breast cancer of upper-inner quadrant of left female breast   Staging form: Breast, AJCC 7th Edition     Clinical: Stage IIB (T2, N1, cM0) - Signed by Eppie Gibson, MD on 01/31/2014     Pathologic: cM0 - Unsigned   SUMMARY OF ONCOLOGIC HISTORY: Oncology History   And     Breast cancer of upper-inner quadrant of left female breast   12/27/2013 Mammogram Mammogram and ultrasound 1.8 cm mass left breast 3:00; 7 cm from nipple with abnormal 1 left axillary lymph node, 2.8 x 1.8 x 1.7 cm, second adjacent smaller nodule 1.2 x 1 x 0.7 cm   01/10/2014 Initial Diagnosis 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        1 AxLN biopsy: ER 70% PR 0% Ki-67 80% HER-2 +2.92, gene copy #5.5   01/19/2014 Breast MRI Left breast: 17 mm mass, with non-mass enhancement both anterior and posterior to the biopsy clip extends 3.2 cm anterior and 2.8 Sinemet as medial to the mass, 1 cm satellite nodule, 2 abnormal lymph nodes left axilla 3.8 cm, 1.4 cm   02/09/2014 -  Neo-Adjuvant Chemotherapy Neoadjuvant chemotherapy with Taxotere, carboplatin, Herceptin and Perjeta x6 cycles   06/07/2014 Breast MRI Left breast: Soft tissue signal 2.1 x 3.1 cm, resolution of enhancement in the lateral portion of the breast, interval decrease in size of left axillary lymph nodes, no suspicious lymph nodes remain    CHIEF COMPLIANT: Herceptin maintenance  INTERVAL HISTORY: Regina Deleon is a 64 year old left pneumectomy with above-mentioned history of left breast cancer completed neoadjuvant chemotherapy and had a breast MRI which showed remarkable response in the breast cancer but still has some residual abnormalities. Lymph nodes have resolved. She has seen Dr. Lucia Gaskins who plans to do a mastectomy on her. She reports that her taste is coming back and energy levels have improved and she has good energy and is without any  nausea vomiting diarrhea or constipation.  REVIEW OF SYSTEMS:   Constitutional: Denies fevers, chills or abnormal weight loss Eyes: Denies blurriness of vision Ears, nose, mouth, throat, and face: Denies mucositis or sore throat Respiratory: Denies cough, dyspnea or wheezes Cardiovascular: Denies palpitation, chest discomfort or lower extremity swelling Gastrointestinal:  Denies nausea, heartburn or change in bowel habits Skin: Denies abnormal skin rashes Lymphatics: Denies new lymphadenopathy or easy bruising Neurological:improvement in neuropathy patient able to walk better Behavioral/Psych: Mood is stable, no new changes   All other systems were reviewed with the patient and are negative.  I have reviewed the past medical history, past surgical history, social history and family history with the patient and they are unchanged from previous note.  ALLERGIES:  is allergic to darvocet; latex; enalapril; and penicillins.  MEDICATIONS:  Current Outpatient Prescriptions  Medication Sig Dispense Refill  . acetaminophen (TYLENOL) 500 MG tablet Take 500 mg by mouth every 6 (six) hours as needed for mild pain.    Marland Kitchen aspirin 81 MG tablet Take 81 mg by mouth every morning.     . cyclobenzaprine (FLEXERIL) 10 MG tablet     . dexamethasone (DECADRON) 4 MG tablet Take 1 tablet (4 mg total) by mouth 2 (two) times daily. Start the day before Taxotere. Then again the day after chemo for 3 days. 30 tablet 1  . fluticasone (FLONASE) 50 MCG/ACT nasal spray Place 2 sprays into both nostrils  daily as needed for allergies or rhinitis.    . furosemide (LASIX) 20 MG tablet Take 1 tablet (20 mg total) by mouth daily. 30 tablet 0  . lidocaine-prilocaine (EMLA) cream Apply 1 application topically as needed. 30 g 0  . LORazepam (ATIVAN) 0.5 MG tablet Take 1 tablet (0.5 mg total) by mouth every 6 (six) hours as needed (Nausea or vomiting). 30 tablet 0  . NIFEdipine (PROCARDIA-XL/ADALAT-CC/NIFEDICAL-XL) 30 MG 24 hr  tablet Take 30 mg by mouth 2 (two) times daily.     . Omega-3 Fatty Acids (OMEGA 3 PO) Take 2 tablets by mouth every morning.    . ondansetron (ZOFRAN) 8 MG tablet Take 1 tablet (8 mg total) by mouth 2 (two) times daily. Start the day after chemo for 3 days. Then take as needed for nausea or vomiting. 30 tablet 1  . prochlorperazine (COMPAZINE) 10 MG tablet Take 1 tablet (10 mg total) by mouth every 6 (six) hours as needed (Nausea or vomiting). 30 tablet 1  . ranitidine (ZANTAC) 150 MG tablet Take 150 mg by mouth 2 (two) times daily.    Marland Kitchen triamterene-hydrochlorothiazide (MAXZIDE-25) 37.5-25 MG per tablet Take 0.5 tablets by mouth every morning. As needed.    . zolpidem (AMBIEN) 10 MG tablet Take 10 mg by mouth at bedtime as needed for sleep.    Marland Kitchen HYDROcodone-acetaminophen (NORCO/VICODIN) 5-325 MG per tablet      No current facility-administered medications for this visit.   Facility-Administered Medications Ordered in Other Visits  Medication Dose Route Frequency Provider Last Rate Last Dose  . sodium chloride 0.9 % injection 10 mL  10 mL Intracatheter PRN Rulon Eisenmenger, MD   10 mL at 02/09/14 1720    PHYSICAL EXAMINATION: ECOG PERFORMANCE STATUS: 1 - Symptomatic but completely ambulatory  Filed Vitals:   06/14/14 0942  BP: 154/70  Pulse: 95  Temp: 97.5 F (36.4 C)  Resp: 20   Filed Weights   06/14/14 0942  Weight: 202 lb 6.4 oz (91.808 kg)    GENERAL:alert, no distress and comfortable SKIN: skin color, texture, turgor are normal, no rashes or significant lesions EYES: normal, Conjunctiva are pink and non-injected, sclera clear OROPHARYNX:no exudate, no erythema and lips, buccal mucosa, and tongue normal  NECK: supple, thyroid normal size, non-tender, without nodularity LYMPH:  no palpable lymphadenopathy in the cervical, axillary or inguinal LUNGS: clear to auscultation and percussion with normal breathing effort HEART: regular rate & rhythm and no murmurs and no lower  extremity edema ABDOMEN:abdomen soft, non-tender and normal bowel sounds Musculoskeletal:no cyanosis of digits and no clubbing  NEURO: alert & oriented x 3 with fluent speech, neuropathy grade 2  LABORATORY DATA:  I have reviewed the data as listed   Chemistry      Component Value Date/Time   NA 144 06/14/2014 0805   K 3.4* 06/14/2014 0805   CO2 28 06/14/2014 0805   BUN 8.0 06/14/2014 0805   CREATININE 0.7 06/14/2014 0805      Component Value Date/Time   CALCIUM 9.9 06/14/2014 0805   ALKPHOS 78 06/14/2014 0805   AST 18 06/14/2014 0805   ALT 13 06/14/2014 0805   BILITOT 0.32 06/14/2014 0805       Lab Results  Component Value Date   WBC 5.6 06/14/2014   HGB 9.4* 06/14/2014   HCT 30.2* 06/14/2014   MCV 101.0 06/14/2014   PLT 360 06/14/2014   NEUTROABS 3.5 06/14/2014     RADIOGRAPHIC STUDIES: I have personally reviewed the radiology  reports and agreed with their findings. MRI breast has been summarized as above  ASSESSMENT & PLAN:  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 AxLN biopsy: ER 70% PR 0% Ki-67 80% HER-2 +2.92, gene copy #5.5 T2, N1, M0 stage IIB  Current treatment: Completed chemotherapy with TCH Perjeta. To be followed by Herceptin maintenance every 3 weeks for 1 year  Chemotherapy toxicities:  1. Neulasta related bone pain and arthralgias 2. Constipation: Improved 3. Metallic taste in the mouth 4. Fatigue due to chemotherapy: Grade 2 5. Chemotherapy related anemia: slowly improving today's hemoglobin 9.4 patient eating a diet rich in iron 6. Hypocalcemia 7. Chemotherapy induced thrombocytopenia: Platelets 360 today. Chemotherapy dose was reduced with cycle 4 and further dose reduction with cycle 5 Taxotere to 55 mg/m and carboplatin to AUC 4. 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 The resolution of axillary lymph nodes and significant improvement in the breast mass clearly evident.  Plan: 1. Dr. Lucia Gaskins plans to do surgery February 22 2. Return to clinic after surgery to discuss final pathology March 3. 3. Because of patient did not get cycle 6 chemotherapy, apparently she is not eligible for [facility] clinical trial. 4. Continue maintenance Herceptin every 3 weeks  No orders of the defined types were placed in this encounter.   The patient has a good understanding of the overall plan. she agrees with it. She will call with any problems that may develop before her next visit here.   Rulon Eisenmenger, MD

## 2014-06-14 NOTE — Assessment & Plan Note (Signed)
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 AxLN biopsy: ER 70% PR 0% Ki-67 80% HER-2 +2.92, gene copy #5.5 T2, N1, M0 stage IIB  Current treatment: Completed chemotherapy with TCH Perjeta. To be followed by Herceptin maintenance every 3 weeks for 1 year  Chemotherapy toxicities:  1. Neulasta related bone pain and arthralgias 2. Constipation: Improved 3. Metallic taste in the mouth 4. Fatigue due to chemotherapy: Grade 2 5. Chemotherapy related anemia: Slowly declining. 6. Hypocalcemia 7. Chemotherapy induced thrombocytopenia: Platelets 111. Chemotherapy dose was reduced with cycle 4 and further dose reduction with cycle 5 Taxotere to 55 mg/m and carboplatin to AUC 4. 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 The resolution of axillary lymph nodes and significant improvement in the breast mass clearly evident.  Plan: 1. Presentation of the tumor board 2. Appointment with general surgery to discuss surgical options 3. Return to clinic after surgery to discuss final pathology. 4. Because of patient did not get cycle 6 chemotherapy, apparently she is not eligible for [facility]IANCE clinical trial.

## 2014-06-14 NOTE — Telephone Encounter (Signed)
Pt called stating Dr Lindi Adie told her to use triamcinolone cream 1%. She saw Dr Lindi Adie today. [facility] said [facility]. No mention in Dr Geralyn Flash note. Message forwarded to Dr Lindi Adie and Beverlee Nims.

## 2014-06-15 ENCOUNTER — Encounter: Payer: BC Managed Care – PPO | Admitting: Nurse Practitioner

## 2014-06-15 ENCOUNTER — Telehealth: Payer: Self-pay | Admitting: Nurse Practitioner

## 2014-06-15 ENCOUNTER — Telehealth: Payer: Self-pay | Admitting: *Deleted

## 2014-06-15 NOTE — Telephone Encounter (Signed)
perp of to ch pt [facility]-pt aware

## 2014-06-15 NOTE — Telephone Encounter (Signed)
per pof to sch pt appt-cld & spoke to pt and adv of time & date-pt understood

## 2014-06-15 NOTE — Telephone Encounter (Signed)
Patient called.  She received Herceptin yesterday at 11am.  At about 7pm she noticed swelling in her legs and feet.  By 9pm this had spread to her hands, arms, stomach and face.  She denied any chest pain, SOB , N/V.  She took Lasix 20mg  and this helped.  This am the swelling has completely resolved in her stomach.  It is better in the rest of the areas, however she still has swelling in her face, arms, hands, legs and feet..  She denies any other sx. Patient has been receiving herceptin since 02/09/14 and has never had this post herceptin.  Her last echo was 05/16/14 with an ejection fraction of 60-65%. Discussed with Selena Lesser NP.   Have patient come in to be evaluated. Called patient back.  She is glad to come in.  She will not have transportation until after lunch.  She thinks she can make it here by 2pm.  Will do POF to be seen by Selena Lesser NP

## 2014-06-15 NOTE — Telephone Encounter (Deleted)
perpof ot sch pt appts-sent to MW to sch pt trmt-sent to Vaughan Basta to pre-cert for [facility] call pt after reply

## 2014-06-15 NOTE — Telephone Encounter (Signed)
Patient called. Her husband just got home from the doctor and "he doesn't look to good, he has pneumonia."  "I think I better stay here with him and not come in."  Tried to see if there was anyone that could stay with him while she came.  She said no, everyone was at work.  Pt. States she is ok.  She appreciates [place] taking such good care of her.  Let her know that if anything changes to let us know.  I will let Selena Lesser NP and Dr. Lindi Adie know.  She appreciated that.

## 2014-06-20 ENCOUNTER — Encounter (HOSPITAL_BASED_OUTPATIENT_CLINIC_OR_DEPARTMENT_OTHER): Payer: Self-pay | Admitting: *Deleted

## 2014-06-20 NOTE — Progress Notes (Signed)
Had labs 06/14/14-will need ekg-to bring all meds, overnight bag-all preop teaching done-no questions

## 2014-06-21 ENCOUNTER — Encounter (HOSPITAL_BASED_OUTPATIENT_CLINIC_OR_DEPARTMENT_OTHER)
Admission: RE | Admit: 2014-06-21 | Discharge: 2014-06-21 | Disposition: A | Discharge status: Home / Self Care | Payer: BC Managed Care – PPO | Source: Ambulatory Visit | Attending: Surgery | Admitting: Surgery

## 2014-06-21 DIAGNOSIS — I10 Essential (primary) hypertension: Secondary | ICD-10-CM | POA: Diagnosis not present

## 2014-06-21 DIAGNOSIS — Z9884 Bariatric surgery status: Secondary | ICD-10-CM | POA: Diagnosis not present

## 2014-06-21 DIAGNOSIS — C50212 Malignant neoplasm of upper-inner quadrant of left female breast: Secondary | ICD-10-CM | POA: Diagnosis not present

## 2014-06-21 DIAGNOSIS — Z91040 Latex allergy status: Secondary | ICD-10-CM | POA: Diagnosis not present

## 2014-06-21 DIAGNOSIS — E785 Hyperlipidemia, unspecified: Secondary | ICD-10-CM | POA: Diagnosis not present

## 2014-06-21 DIAGNOSIS — Z79899 Other long term (current) drug therapy: Secondary | ICD-10-CM | POA: Diagnosis not present

## 2014-06-21 DIAGNOSIS — Z01818 Encounter for other preprocedural examination: Secondary | ICD-10-CM | POA: Diagnosis present

## 2014-06-21 DIAGNOSIS — Z5111 Encounter for antineoplastic chemotherapy: Secondary | ICD-10-CM | POA: Diagnosis not present

## 2014-06-21 DIAGNOSIS — Z7982 Long term (current) use of aspirin: Secondary | ICD-10-CM | POA: Diagnosis not present

## 2014-06-21 DIAGNOSIS — C50912 Malignant neoplasm of unspecified site of left female breast: Secondary | ICD-10-CM | POA: Diagnosis not present

## 2014-06-21 DIAGNOSIS — Z885 Allergy status to narcotic agent status: Secondary | ICD-10-CM | POA: Diagnosis not present

## 2014-06-21 DIAGNOSIS — M940 Chondrocostal junction syndrome [Tietze]: Secondary | ICD-10-CM | POA: Diagnosis not present

## 2014-06-21 DIAGNOSIS — Z170 Estrogen receptor positive status [ER+]: Secondary | ICD-10-CM | POA: Diagnosis not present

## 2014-06-21 DIAGNOSIS — L989 Disorder of the skin and subcutaneous tissue, unspecified: Secondary | ICD-10-CM | POA: Diagnosis not present

## 2014-06-25 NOTE — H&P (Signed)
Regina Deleon. [place] 05/31/2014 2:53 PM Location: [facility] Patient #: [id number] DOB: 03/28/1951 Married / Language: English / Race: Black or African American Female  History of Present Illness: Patient words: breast check.  The patient is a 64 year old female who presents with breast cancer. Her PCP is Dr. Tiburcio Bash [facility]). Her treating oncologist are Drs. Philis Nettle. Regina Deleon comes for follow up of left breast cancer. She has had neo adjuvant chemotx. IR placed power port 01/31/2014.  The patient comes by herself today  She is undergoing neoadjuvant chemotx for left breast cancer by Dr. Lindi Adie. She got through most of the chemotx, but had increasing neurologic symptoms and stopped her treatmetn early. Her last treatment was 05/23/2014. So she is here to talk about the surgery for her breast cancer. She is a candidate for left bresat lumpectomy - and will probably need a left axillary dissection. She is up for an MRI 06/06/2014. She also complained of SOB - she had an echo about 2 weeks ago - it showed an EF of 60%. I gave her a copy of the report. She also has a mole she wants me to remove.  From Tyrell Antonio: "I wanted to give you an update on Regina Deleon, who you referred for the Alliance [phone] surgery clinical trial. Due to peripheral neuropathy symptoms, Dr. Lindi Adie discontinued chemotherapy early today, after five cycles, while the patient continues to receive Her-2 targeted therapy. Because she received fewer than six cycles of chemotherapy, she does not qualify for the Alliance trial. She also will not qualify for the B-51 study (if node negative), because she received fewer than 12 weeks of chemotherapy."  But she is Her2Neu positive - so she will need a treatment for one year.  History of Breast Cancer: Her last mammogram was about 2013. She has no family history of breast cancer. Her last period was 76 when she had hysterectomy. She was placed on  Premarin after hysterectomy, but stopped this about 1995.  Regina Deleon had screening mammography at the breast Center on 12/27/2013 that showed an abnormality in her left breast. She went for further diagnostic tests on 01/10/2014 that showed a 1.8 cm suspicious mass (by [place]) (measures 2.1 cm by mammogram) at the 3:00 position with one abnormal appearing lymph node. 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  ER positive and HER2Neu positive. T2, N1, M0, Stage IIB  Past Medical History: 1. Regina Deleon is a Lap Band patient of mine. I did a Lap Band (10 cm) in 10/2005.  She has done well with the surgery, though she has gained about 18 pounds since I last saw her. 2. 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 She has 2 children: Debroah Baller age 78 who lives in [place] and "Sport" age 46 lives in [place].  Her husband is also retired.   Addendum Note(Mattew Chriswell H. Lucia Gaskins MD; 06/08/2014 4:11 PM) Her MRI on 06/06/2014 shows a left breast 2.1 x 3.1 cm area of persistent soft tissue signal. The abnormal enhancement in the left lateral breast has resolved. The left axillary lymph nodes are better. I reviewed the films with Dr. Rochele Pages. She had three biopsies originally. The clips lie about 4 to 5 cm apart in the lateral and UOQ of the left breast. The soft tissue mass is  medial to the clips. It would require a generous lumpectomy, but would be possible. I had a 20 mintue discussion with Regina Deleon on the phone about the options going forward. I outlined possible lumpectomy vs mastectomy. I talked about getting clear margins, but we would not know this until the final pathology. She would prefer a mastectomy. At this time, she is not a candidate for reconstruction becuase of the unknown  amount of cancer remaining. But she could have reconstruction later. I will set her up for a left MRM. I discussed hospital course and post op recovery. 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 ER positive and HER2Neu positive. T2, N1, M0, Stage IIB Sees Drs. Lindi Adie and Squire HISTORY OF LAPAROSCOPIC ADJUSTABLE GASTRIC BANDING (V45.86  Z98.84) Had a Lap Band (10 cm) placed 10/19/2005 - D. Shalane Florendo Initial weight - 222, BMI - 39.45. No pertinent past medical history  Past Surgical History Shann Medal, MD; 05/31/2014 3:44 PM) Breast Biopsy Left. Hysterectomy (not due to cancer) - Partial Lap Band Spinal Surgery Midback  Diagnostic Studies History Shann Medal, MD; 05/31/2014 3:44 PM) Colonoscopy 5-10 years ago Mammogram within last year Pap Smear 1-5 years ago  Allergies (Ammie Eversole, LPN; 05/30/5168 0:17 PM) Darvocet A500 *ANALGESICS - OPIOID* Latex Gloves *MEDICAL DEVICES*  Medication History (Ammie Eversole, LPN; 4/94/4967 5:91 PM) Procardia XL (30MG Tablet ER 24HR, twice Oral daily) Active. Ambien (5MG Tablet, Oral) Active. Aspirin Childrens (81MG Tablet Chewable, Oral) Active. Omega 3 (1000MG Capsule, Oral) Active. Zantac 150 Maximum Strength (150MG Tablet, Oral) Active.  Social History Shann Medal, MD; 05/31/2014 3:44 PM) Caffeine use Coffee. No alcohol use No drug use Tobacco use Never smoker.  Family History Shann Medal, MD; 05/31/2014 3:44 PM) Arthritis Mother. Diabetes Mellitus Father, Mother. Heart Disease Brother. Heart disease in female family member before age 31 Heart disease in female family member before age 65 Hypertension Father, Mother. Respiratory  Condition Mother.  Pregnancy / Birth History Shann Medal, MD; 05/31/2014 3:44 PM) Age at menarche 34 years. Age of menopause <45 Gravida 2 Maternal age 22-20 Para 2  Review of Systems Fenton Malling. Lucia Gaskins MD; 05/31/2014 3:44 PM) Breast Present- Breast Pain. Not Present- Breast Mass, Nipple Discharge and Skin Changes.   Vitals (Ammie Eversole LPN; 6/38/4665 9:93 PM) 05/31/2014 2:54 PM Weight: 205.4 lb Height: 63in Body Surface Area: 2.04 m Body Mass Index: 36.38 kg/m Temp.: 98.32F(Oral)  Pulse: 82 (Regular)  BP: 132/68 (Sitting, Left Arm, Standard)  Physical Exam: General Mental Status-Alert. General Appearance-Not in acute distress. Build & Nutrition-Well nourished.  Head and Neck Head-normocephalic, atraumatic with no lesions or palpable masses. Neck Global Assessment - no lymphadenopathy, no palpable mass on the right, no palpable mass on the left.  Chest and Lung Exam Chest and lung exam reveals -normal excursion with symmetric chest walls and on auscultation, normal breath sounds, no adventitious sounds and normal vocal resonance. Note: Port in upper inner right chest. she is doing well with this.  Breast Note: Right - No mass or lesion.  Left - I do not feel any mass or lump in the left breast. She has a 0.8 cm mole/cyst on her left breast at 5 o'clock that she wants removed.   Cardiovascular Cardiovascular examination reveals -normal heart  sounds, regular rate and rhythm with no murmurs.  Abdomen Inspection-Inspection Normal. Palpation/Percussion Normal exam - Non Tender. Auscultation Normal exam - Bowel sounds normal.  Neurologic Neurologic evaluation reveals -normal coordination. Mental Status Speech - Normal. Sensory-Normal. Motor-Normal.  Musculoskeletal Normal Exam - Bilateral-Upper Extremity Strength Normal and Lower Extremity Strength Normal.  Lymphatic Head & Neck General Head & Neck Lymphatics:  Bilateral - Description - Normal. Axillary -Note: Normal. But her weight limits the exam.   Assessment & Plan: 1.  MALIGNANT NEOPLASM OF UPPER-OUTER QUADRANT OF LEFT FEMALE BREAST (174.4  C50.412) Story: Left breast on 01/10/2014 (Accession: [id number]) shows invasive ductal carcinoma and invasive cancer in one lymph node positive for metastatic ductal carcinoma. ER positive and HER2Neu positive. T2, N1, M0, Stage IIB  Sees Drs. Philis Nettle Impression: She finished chemotx on 05/23/2014.  She stopped her chemotx early. She is now coming up on surgery. She is getting an MRI. This will answer the question of the size of the breast cancer.  The second is does she need a left axillary node dissection vs SLNBx.  She gets an MRI on 06/06/2014 - I will discuss our plan with her after the MRI. For now I plan a left breast seed localization lumpectomy with a left axillary node dissection.  She sees Dr. Lindi Adie on 06/14/2014. Current Plans    I had a 20 mintue discussion with Regina Deleon on the phone about the options going forward. I outlined possible lumpectomy vs mastectomy. I talked about getting clear margins, but we would not know this until the final pathology. She would prefer a mastectomy. At this time, she is not a candidate for reconstruction becuase of the unknown amount of cancer remaining. But she could have reconstruction later. I will set her up for a left MRM. I discussed hospital course and post op recovery.  2.  HISTORY OF LAPAROSCOPIC ADJUSTABLE GASTRIC BANDING (V45.86  Z98.84) Story: Had a Lap Band (10 cm) placed 10/19/2005 - D. Ejay Lashley Initial weight - 222, BMI - 39.45.  3.  SKIN LESION OF BREAST (611.9  N64.9) Story: 0.8 cm in size at about 5 o'clock. Impression: Will plan to excise this when I do her left breast surgery.  Alphonsa Overall, MD, [facility] Pager: [phone] Office phone:  [phone]

## 2014-06-26 ENCOUNTER — Ambulatory Visit (HOSPITAL_BASED_OUTPATIENT_CLINIC_OR_DEPARTMENT_OTHER): Payer: BC Managed Care – PPO | Admitting: Anesthesiology

## 2014-06-26 ENCOUNTER — Ambulatory Visit (HOSPITAL_BASED_OUTPATIENT_CLINIC_OR_DEPARTMENT_OTHER)
Admission: RE | Admit: 2014-06-26 | Discharge: 2014-06-27 | Disposition: A | Discharge status: Home / Self Care | Payer: BC Managed Care – PPO | Source: Ambulatory Visit | Attending: Surgery | Admitting: Surgery

## 2014-06-26 ENCOUNTER — Encounter (HOSPITAL_BASED_OUTPATIENT_CLINIC_OR_DEPARTMENT_OTHER): Payer: Self-pay | Admitting: *Deleted

## 2014-06-26 ENCOUNTER — Encounter (HOSPITAL_BASED_OUTPATIENT_CLINIC_OR_DEPARTMENT_OTHER)
Admission: RE | Disposition: A | Discharge status: Home / Self Care | Payer: Self-pay | Source: Ambulatory Visit | Attending: Surgery

## 2014-06-26 DIAGNOSIS — C50912 Malignant neoplasm of unspecified site of left female breast: Secondary | ICD-10-CM | POA: Insufficient documentation

## 2014-06-26 DIAGNOSIS — Z9884 Bariatric surgery status: Secondary | ICD-10-CM | POA: Insufficient documentation

## 2014-06-26 DIAGNOSIS — Z91040 Latex allergy status: Secondary | ICD-10-CM | POA: Insufficient documentation

## 2014-06-26 DIAGNOSIS — Z170 Estrogen receptor positive status [ER+]: Secondary | ICD-10-CM | POA: Insufficient documentation

## 2014-06-26 DIAGNOSIS — I10 Essential (primary) hypertension: Secondary | ICD-10-CM | POA: Insufficient documentation

## 2014-06-26 DIAGNOSIS — Z7982 Long term (current) use of aspirin: Secondary | ICD-10-CM | POA: Insufficient documentation

## 2014-06-26 DIAGNOSIS — E785 Hyperlipidemia, unspecified: Secondary | ICD-10-CM | POA: Insufficient documentation

## 2014-06-26 DIAGNOSIS — C50919 Malignant neoplasm of unspecified site of unspecified female breast: Secondary | ICD-10-CM | POA: Diagnosis present

## 2014-06-26 DIAGNOSIS — M940 Chondrocostal junction syndrome [Tietze]: Secondary | ICD-10-CM | POA: Insufficient documentation

## 2014-06-26 DIAGNOSIS — Z5111 Encounter for antineoplastic chemotherapy: Secondary | ICD-10-CM | POA: Insufficient documentation

## 2014-06-26 DIAGNOSIS — L989 Disorder of the skin and subcutaneous tissue, unspecified: Secondary | ICD-10-CM | POA: Insufficient documentation

## 2014-06-26 DIAGNOSIS — Z79899 Other long term (current) drug therapy: Secondary | ICD-10-CM | POA: Insufficient documentation

## 2014-06-26 DIAGNOSIS — Z885 Allergy status to narcotic agent status: Secondary | ICD-10-CM | POA: Insufficient documentation

## 2014-06-26 HISTORY — DX: Localized edema: R60.0

## 2014-06-26 HISTORY — DX: Polyneuropathy, unspecified: G62.9

## 2014-06-26 HISTORY — DX: Malignant (primary) neoplasm, unspecified: C80.1

## 2014-06-26 HISTORY — PX: MASTECTOMY MODIFIED RADICAL: SHX5962

## 2014-06-26 HISTORY — DX: Presence of spectacles and contact lenses: Z97.3

## 2014-06-26 HISTORY — DX: Gastro-esophageal reflux disease without esophagitis: K21.9

## 2014-06-26 HISTORY — PX: AXILLARY LYMPH NODE DISSECTION: SHX5229

## 2014-06-26 SURGERY — MASTECTOMY, MODIFIED RADICAL
Anesthesia: General | Laterality: Left

## 2014-06-26 MED ORDER — FENTANYL CITRATE 0.05 MG/ML IJ SOLN
INTRAMUSCULAR | Status: AC
  Filled 2014-06-26: qty 2

## 2014-06-26 MED ORDER — NIFEDIPINE ER 30 MG PO TB24: 30.0000 mg | ORAL_TABLET | Freq: Two times a day (BID) | ORAL | Status: DC

## 2014-06-26 MED ORDER — HYDROMORPHONE HCL 1 MG/ML IJ SOLN: 0.2500 mg | INTRAMUSCULAR | Status: DC | PRN

## 2014-06-26 MED ORDER — MIDAZOLAM HCL 2 MG/2ML IJ SOLN
1.0000 mg | INTRAMUSCULAR | Status: DC | PRN
  Administered 2014-06-26: 2 mg via INTRAVENOUS

## 2014-06-26 MED ORDER — FENTANYL CITRATE 0.05 MG/ML IJ SOLN
INTRAMUSCULAR | Status: AC
Start: 2014-06-26 — End: 2014-06-26
  Filled 2014-06-26: qty 4

## 2014-06-26 MED ORDER — ONDANSETRON HCL 4 MG/2ML IJ SOLN: 4.0000 mg | Freq: Four times a day (QID) | INTRAMUSCULAR | Status: DC | PRN

## 2014-06-26 MED ORDER — FLUTICASONE PROPIONATE 50 MCG/ACT NA SUSP: 2.0000 | Freq: Every day | NASAL | Status: DC | PRN

## 2014-06-26 MED ORDER — DEXAMETHASONE SODIUM PHOSPHATE 4 MG/ML IJ SOLN
INTRAMUSCULAR | Status: DC | PRN
  Administered 2014-06-26: 10 mg via INTRAVENOUS

## 2014-06-26 MED ORDER — CHLORHEXIDINE GLUCONATE 4 % EX LIQD: 1.0000 "application " | Freq: Once | CUTANEOUS | Status: DC

## 2014-06-26 MED ORDER — 0.9 % SODIUM CHLORIDE (POUR BTL) OPTIME
TOPICAL | Status: DC | PRN
  Administered 2014-06-26: 2000 mL

## 2014-06-26 MED ORDER — ONDANSETRON HCL 4 MG PO TABS
4.0000 mg | ORAL_TABLET | Freq: Four times a day (QID) | ORAL | Status: DC | PRN
  Administered 2014-06-26 – 2014-06-27 (×2): 4 mg via ORAL
  Filled 2014-06-26 (×2): qty 1

## 2014-06-26 MED ORDER — CEFAZOLIN SODIUM-DEXTROSE 2-3 GM-% IV SOLR
2.0000 g | INTRAVENOUS | Status: AC
  Administered 2014-06-26: 2 g via INTRAVENOUS

## 2014-06-26 MED ORDER — MIDAZOLAM HCL 2 MG/2ML IJ SOLN
INTRAMUSCULAR | Status: AC
  Filled 2014-06-26: qty 2

## 2014-06-26 MED ORDER — CEFAZOLIN SODIUM-DEXTROSE 2-3 GM-% IV SOLR
INTRAVENOUS | Status: AC
  Filled 2014-06-26: qty 50

## 2014-06-26 MED ORDER — HEPARIN SODIUM (PORCINE) 5000 UNIT/ML IJ SOLN
5000.0000 [IU] | Freq: Three times a day (TID) | INTRAMUSCULAR | Status: DC
  Administered 2014-06-26 – 2014-06-27 (×2): 5000 [IU] via SUBCUTANEOUS

## 2014-06-26 MED ORDER — FENTANYL CITRATE 0.05 MG/ML IJ SOLN
INTRAMUSCULAR | Status: DC | PRN
  Administered 2014-06-26 (×8): 25 ug via INTRAVENOUS

## 2014-06-26 MED ORDER — HYDROCODONE-ACETAMINOPHEN 5-325 MG PO TABS
1.0000 | ORAL_TABLET | ORAL | Status: DC | PRN
  Administered 2014-06-26 – 2014-06-27 (×5): 2 via ORAL
  Filled 2014-06-26 (×5): qty 2

## 2014-06-26 MED ORDER — MORPHINE SULFATE 2 MG/ML IJ SOLN
1.0000 mg | INTRAMUSCULAR | Status: DC | PRN
  Administered 2014-06-26: 2 mg via INTRAVENOUS
  Filled 2014-06-26: qty 1

## 2014-06-26 MED ORDER — HEPARIN SODIUM (PORCINE) 5000 UNIT/ML IJ SOLN
INTRAMUSCULAR | Status: AC
Start: 2014-06-26 — End: 2014-06-26
  Filled 2014-06-26: qty 1

## 2014-06-26 MED ORDER — ONDANSETRON HCL 4 MG/2ML IJ SOLN: 4.0000 mg | Freq: Once | INTRAMUSCULAR | Status: AC | PRN

## 2014-06-26 MED ORDER — PROPOFOL 10 MG/ML IV BOLUS
INTRAVENOUS | Status: DC | PRN
  Administered 2014-06-26: 160 mg via INTRAVENOUS

## 2014-06-26 MED ORDER — CYCLOBENZAPRINE HCL 10 MG PO TABS
10.0000 mg | ORAL_TABLET | Freq: Three times a day (TID) | ORAL | Status: DC | PRN
  Administered 2014-06-26 – 2014-06-27 (×2): 10 mg via ORAL
  Filled 2014-06-26 (×2): qty 1

## 2014-06-26 MED ORDER — KCL IN DEXTROSE-NACL 20-5-0.45 MEQ/L-%-% IV SOLN
INTRAVENOUS | Status: DC
  Administered 2014-06-26: 13:00:00 via INTRAVENOUS
  Filled 2014-06-26: qty 1000

## 2014-06-26 MED ORDER — ONDANSETRON HCL 4 MG/2ML IJ SOLN
INTRAMUSCULAR | Status: DC | PRN
  Administered 2014-06-26: 4 mg via INTRAVENOUS

## 2014-06-26 MED ORDER — MEPERIDINE HCL 25 MG/ML IJ SOLN: 6.2500 mg | INTRAMUSCULAR | Status: DC | PRN

## 2014-06-26 MED ORDER — LACTATED RINGERS IV SOLN
INTRAVENOUS | Status: DC
  Administered 2014-06-26 (×2): via INTRAVENOUS

## 2014-06-26 MED ORDER — LIDOCAINE HCL (CARDIAC) 20 MG/ML IV SOLN
INTRAVENOUS | Status: DC | PRN
  Administered 2014-06-26: 80 mg via INTRAVENOUS

## 2014-06-26 MED ORDER — ZOLPIDEM TARTRATE 10 MG PO TABS
10.0000 mg | ORAL_TABLET | Freq: Every evening | ORAL | Status: DC | PRN
Start: 2014-06-26 — End: 2014-06-27
  Administered 2014-06-26: 10 mg via ORAL

## 2014-06-26 MED ORDER — LORAZEPAM 0.5 MG PO TABS: 0.5000 mg | ORAL_TABLET | Freq: Four times a day (QID) | ORAL | Status: DC | PRN

## 2014-06-26 MED ORDER — FENTANYL CITRATE 0.05 MG/ML IJ SOLN
50.0000 ug | INTRAMUSCULAR | Status: DC | PRN
  Administered 2014-06-26: 100 ug via INTRAVENOUS

## 2014-06-26 MED ORDER — PROCHLORPERAZINE MALEATE 10 MG PO TABS: 10.0000 mg | ORAL_TABLET | Freq: Four times a day (QID) | ORAL | Status: DC | PRN

## 2014-06-26 SURGICAL SUPPLY — 89 items
APL SKNCLS STERI-STRIP NONHPOA (GAUZE/BANDAGES/DRESSINGS) ×1
APPLICATOR CHLORAPREP 3ML ORNG (MISCELLANEOUS) ×3 IMPLANT
APPLIER CLIP 11 MED OPEN (CLIP) ×3
APPLIER CLIP 9.375 MED OPEN (MISCELLANEOUS)
APR CLP MED 11 20 MLT OPN (CLIP) ×1
APR CLP MED 9.3 20 MLT OPN (MISCELLANEOUS)
BENZOIN TINCTURE PRP APPL 2/3 (GAUZE/BANDAGES/DRESSINGS) ×3 IMPLANT
BINDER BREAST LRG (GAUZE/BANDAGES/DRESSINGS) IMPLANT
BINDER BREAST MEDIUM (GAUZE/BANDAGES/DRESSINGS) IMPLANT
BINDER BREAST XLRG (GAUZE/BANDAGES/DRESSINGS) ×2 IMPLANT
BINDER BREAST XXLRG (GAUZE/BANDAGES/DRESSINGS) ×2 IMPLANT
BIOPATCH RED 1 DISK 7.0 (GAUZE/BANDAGES/DRESSINGS) ×2 IMPLANT
BIOPATCH RED 1IN DISK 7.0MM (GAUZE/BANDAGES/DRESSINGS) ×2
BLADE CLIPPER SURG (BLADE) IMPLANT
BLADE HEX COATED 2.75 (ELECTRODE) ×3 IMPLANT
BLADE SURG 10 STRL SS (BLADE) ×3 IMPLANT
BLADE SURG 15 STRL LF DISP TIS (BLADE) ×1 IMPLANT
BLADE SURG 15 STRL SS (BLADE) ×3
CANISTER SUCT 1200ML W/VALVE (MISCELLANEOUS) ×3 IMPLANT
CHLORAPREP W/TINT 26ML (MISCELLANEOUS) ×3 IMPLANT
CLEANER CAUTERY TIP 5X5 PAD (MISCELLANEOUS) ×1 IMPLANT
CLIP APPLIE 11 MED OPEN (CLIP) IMPLANT
CLIP APPLIE 9.375 MED OPEN (MISCELLANEOUS) IMPLANT
CLIP TI MEDIUM 6 (CLIP) ×3 IMPLANT
CLIP TI WIDE RED SMALL 6 (CLIP) IMPLANT
CLOSURE WOUND 1/2 X4 (GAUZE/BANDAGES/DRESSINGS) ×1
COVER BACK TABLE 60X90IN (DRAPES) ×3 IMPLANT
COVER MAYO STAND STRL (DRAPES) ×3 IMPLANT
COVER PROBE 5X48 (MISCELLANEOUS)
COVER PROBE W GEL 5X96 (DRAPES) ×1 IMPLANT
DECANTER SPIKE VIAL GLASS SM (MISCELLANEOUS) IMPLANT
DRAIN CHANNEL 19F RND (DRAIN) ×5 IMPLANT
DRAIN HEMOVAC 1/8 X 5 (WOUND CARE) ×3 IMPLANT
DRAPE LAPAROSCOPIC ABDOMINAL (DRAPES) ×3 IMPLANT
DRAPE LAPAROTOMY T 102X78X121 (DRAPES) ×3 IMPLANT
DRAPE UTILITY XL STRL (DRAPES) ×3 IMPLANT
DRSG PAD ABDOMINAL 8X10 ST (GAUZE/BANDAGES/DRESSINGS) ×4 IMPLANT
ELECT REM PT RETURN 9FT ADLT (ELECTROSURGICAL) ×3
ELECTRODE REM PT RTRN 9FT ADLT (ELECTROSURGICAL) ×1 IMPLANT
EVACUATOR SILICONE 100CC (DRAIN) ×5 IMPLANT
FILTER 7/8 IN (FILTER) ×2 IMPLANT
GAUZE SPONGE 4X4 12PLY STRL (GAUZE/BANDAGES/DRESSINGS) ×3 IMPLANT
GAUZE SPONGE 4X4 16PLY XRAY LF (GAUZE/BANDAGES/DRESSINGS) IMPLANT
GLOVE BIOGEL PI IND STRL 7.0 (GLOVE) IMPLANT
GLOVE BIOGEL PI INDICATOR 7.0 (GLOVE) ×2
GLOVE EXAM NITRILE LRG STRL (GLOVE) ×2 IMPLANT
GLOVE SURG SIGNA 7.5 PF LTX (GLOVE) ×3 IMPLANT
GLOVE SURG SS PI 6.5 STRL IVOR (GLOVE) ×2 IMPLANT
GLOVE SURG SS PI 7.5 STRL IVOR (GLOVE) ×2 IMPLANT
GOWN STRL REUS W/ TWL LRG LVL3 (GOWN DISPOSABLE) ×1 IMPLANT
GOWN STRL REUS W/TWL LRG LVL3 (GOWN DISPOSABLE) ×3
KIT CVR 48X5XPRB PLUP LF (MISCELLANEOUS) IMPLANT
LIQUID BAND (GAUZE/BANDAGES/DRESSINGS) ×2 IMPLANT
NDL HYPO 25X1 1.5 SAFETY (NEEDLE) ×1 IMPLANT
NEEDLE HYPO 22GX1.5 SAFETY (NEEDLE) IMPLANT
NEEDLE HYPO 25X1 1.5 SAFETY (NEEDLE) ×3 IMPLANT
NS IRRIG 1000ML POUR BTL (IV SOLUTION) ×3 IMPLANT
PACK BASIN DAY SURGERY FS (CUSTOM PROCEDURE TRAY) ×3 IMPLANT
PAD CLEANER CAUTERY TIP 5X5 (MISCELLANEOUS) ×2
PENCIL BUTTON HOLSTER BLD 10FT (ELECTRODE) ×3 IMPLANT
PIN SAFETY STERILE (MISCELLANEOUS) ×3 IMPLANT
SHEET MEDIUM DRAPE 40X70 STRL (DRAPES) ×3 IMPLANT
SLEEVE SCD COMPRESS KNEE MED (MISCELLANEOUS) ×2 IMPLANT
SPONGE GAUZE 4X4 12PLY STER LF (GAUZE/BANDAGES/DRESSINGS) IMPLANT
SPONGE LAP 18X18 X RAY DECT (DISPOSABLE) ×3 IMPLANT
SPONGE LAP 4X18 X RAY DECT (DISPOSABLE) IMPLANT
STAPLER VISISTAT 35W (STAPLE) ×3 IMPLANT
STRIP CLOSURE SKIN 1/2X4 (GAUZE/BANDAGES/DRESSINGS) ×2 IMPLANT
SUT ETHILON 2 0 FS 18 (SUTURE) ×5 IMPLANT
SUT MNCRL AB 4-0 PS2 18 (SUTURE) ×2 IMPLANT
SUT SILK 2 0 SH (SUTURE) IMPLANT
SUT SILK 3 0 TIES 17X18 (SUTURE) ×3
SUT SILK 3-0 18XBRD TIE BLK (SUTURE) ×1 IMPLANT
SUT VIC AB 3-0 SH 27 (SUTURE) ×3
SUT VIC AB 3-0 SH 27X BRD (SUTURE) ×1 IMPLANT
SUT VIC AB 4-0 SH 27 (SUTURE) ×3
SUT VIC AB 4-0 SH 27XANBCTRL (SUTURE) ×1 IMPLANT
SUT VIC AB 5-0 PS2 18 (SUTURE) IMPLANT
SUT VICRYL 3-0 CR8 SH (SUTURE) ×5 IMPLANT
SUT VICRYL AB 2 0 TIE (SUTURE) IMPLANT
SUT VICRYL AB 2 0 TIES (SUTURE)
SYR BULB 3OZ (MISCELLANEOUS) ×3 IMPLANT
SYR CONTROL 10ML LL (SYRINGE) ×3 IMPLANT
TOWEL OR 17X24 6PK STRL BLUE (TOWEL DISPOSABLE) ×6 IMPLANT
TOWEL OR NON WOVEN STRL DISP B (DISPOSABLE) ×3 IMPLANT
TUBE CONNECTING 20'X1/4 (TUBING) ×1
TUBE CONNECTING 20X1/4 (TUBING) ×2 IMPLANT
VAC PENCILS W/TUBING CLEAR (MISCELLANEOUS) ×2 IMPLANT
YANKAUER SUCT BULB TIP NO VENT (SUCTIONS) ×3 IMPLANT

## 2014-06-26 NOTE — Progress Notes (Signed)
Assisted Dr. Ossey with left, ultrasound guided, pectoralis block. Side rails up, monitors on throughout procedure. See vital signs in flow sheet. Tolerated Procedure well. 

## 2014-06-26 NOTE — Op Note (Signed)
06/26/2014  11:07 AM  PATIENT:  Regina Regina Deleon, 64 y.o., female, MRN: [id number]  PREOP DIAGNOSIS:  Left breast cancer  POSTOP DIAGNOSIS:   Left breast cancer, 2 o'clock position (T2, N1), she has completed neo-adjuvant chemotherapy  PROCEDURE:   Procedure(s):  Left MASTECTOMY MODIFIED RADICAL, Left AXILLARY LYMPH NODE DISSECTION, removal of skin lesion of inferior breast flap  SURGEON:   Alphonsa Overall, M.D.  ASSISTANT:   None  ANESTHESIA:   general  Anesthesiologist: Lillia Abed, MD CRNA: Maryella Shivers, CRNA  General  ASA:  2  EBL:  100  ml  BLOOD ADMINISTERED: none  DRAINS: 2 19 French Blake Drains  LOCAL MEDICATIONS USED:   None.  Patient had a pectoral block by Dr. Conrad Bellflower in the holding area  SPECIMEN:   Left modified radical mastectomy specimen (suture lateral),  Additional lymph node tissue,  Superior skin flap (long suture lateral, short suture inferior), Inferior skin glap (long suture lateral, short suture superior), skin lesion of inferior skin flap  COUNTS CORRECT:  YES  INDICATIONS FOR PROCEDURE:  DEERICA Deleon is a 64 y.o. (DOB: Aug 09, 1950) AA  female whose primary care physician is Lavera Guise, MD and comes for left modified radical mastectomy.  She had a left breast cancer diagnosed in Sept 2015.  She also had a left axillary node biopsy which showed invasive breast cancer.  She had neo-adjuvant chemotherapy by Dr. Lindi Adie.  She has seen Dr. Isidore Moos for rad oncology.  We discussed possible lumpectomy, but the patient wanted a mastectomy.   The indications and risks of the surgery were explained to the patient.  The risks include, but are not limited to, infection, bleeding, and nerve injury.  OPERATIVE NOTE;  The patient was taken to room # 2 at [facility] where she underwent a general anesthesia  supervised by Anesthesiologist: Lillia Abed, MD CRNA: Maryella Shivers, CRNA. Her left breast and axilla were prepped with  ChloraPrep and sterilely draped.     A time-out and the surgical check list was reviewed.    She had a 1.0 cm superficial skin lesion of the inferior left breast at about 5 o'clock.  I removed this at the beginning of the case. This was sent to pathology separately.   I made an elliptical incision including the areola in the left breast.  I developed skin flaps medially to the lateral edge of the sternum, inferiorly to the investing fascia of the rectus abdominus muscle, laterally to the anterior edge of the latissimus dorsi muscle, and superiorly to about 2 finger breaths below the clavicle.  The breast was reflected off the pectoralis muscle from medial to lateral.     I dissected into the left axilla and did a complete lymph node dissection of Level 1 and Level 2 nodes.  I found the axillary vein.  There was one inferior tributary I divided.  I identified the long thoracic nerve of Bell and the thoracodorsal nerve during the dissection.  I swept the axillary contents out with the mastectomy specimen.  I placed a suture laterally on the breast specimen to make the lateral edge.   There was some additional fat/lymph nodes in the left axilla that I sent.  I dissected the fat underneath the pectoralis minor and swept this out.   Because of excess skin, I removed part of the superior flap, marked it with a long suture lateral and a short suture inferior.    I removed part of the  inferior flap, marked it with a long suture lateral and a short suture superior.   I included the inferior skin lesion excision with the inferior flap excision.   I brought out 2 62 F Blake drains below the inferior flaps.  These were sewn in place with 2-0 Nylons.  I irrigated the wound with 2,000 cc of fluid.   The skin was closed with interrupted 3-0 Vicryl sutures and the skin was closed with a 4-0 Monocryl.  The  Wound was painted with LlquiBand.    A pressure dressing was placed on the wound and the chest wrapped with a breast binder.  Her needle and  sponge count were correct at the end of the case.   She was transferred to the recovery room in good condition.  Alphonsa Overall, MD, [facility] Pager: [phone] Office phone:  [phone]

## 2014-06-26 NOTE — Transfer of Care (Signed)
Immediate Anesthesia Transfer of Care Note  Patient: Regina Deleon  Procedure(s) Performed: Procedure(s): MASTECTOMY MODIFIED RADICAL (Left) AXILLARY LYMPH NODE DISSECTION (Left)  Patient Location: PACU  Anesthesia Type:General  Level of Consciousness: sedated  Airway & Oxygen Therapy: Patient Spontanous Breathing and Patient connected to face mask oxygen  Post-op Assessment: Report given to RN and Post -op Vital signs reviewed and stable  Post vital signs: Reviewed and stable  Last Vitals:  Filed Vitals:   06/26/14 0900  BP: 115/60  Pulse: 84  Temp:   Resp: 24    Complications: No apparent anesthesia complications

## 2014-06-26 NOTE — Interval H&P Note (Signed)
History and Physical Interval Note:  06/26/2014 8:41 AM  Regina Deleon  has presented today for surgery, with the diagnosis of left breast cancer  The various methods of treatment have been discussed with the patient and family.  Her husband is here with her.  She is thinking about stopping the chemotherapy, but will wait till the final path before decision.  After consideration of risks, benefits and other options for treatment, the patient has consented to  Procedure(s): MASTECTOMY MODIFIED RADICAL (Left) AXILLARY LYMPH NODE DISSECTION (Left) as a surgical intervention .  The patient's history has been reviewed, patient examined, no change in status, stable for surgery.  I have reviewed the patient's chart and labs.  Questions were answered to the patient's satisfaction.     Chloie Loney H

## 2014-06-26 NOTE — Anesthesia Procedure Notes (Addendum)
Anesthesia Regional Block:  Pectoralis block  Pre-Anesthetic Checklist: ,, timeout performed, Correct Patient, Correct Site, Correct Laterality, Correct Procedure, Correct Position, site marked, Risks and benefits discussed,  Surgical consent,  Pre-op evaluation,  At surgeon's request and post-op pain management  Laterality: Left  Prep: chloraprep       Needles:  Injection technique: Single-shot     Needle Length: 9cm 9 cm Needle Gauge: 21 and 21 G    Additional Needles:  Procedures: ultrasound guided (picture in chart) Pectoralis block Narrative:  Start time: 06/26/2014 8:45 AM End time: 06/26/2014 8:55 AM Injection made incrementally with aspirations every 5 mL.  Performed by: Personally  Anesthesiologist: Lillia Abed  Additional Notes: Monitors applied. Patient sedated. Sterile prep and drape,hand hygiene and sterile gloves were used. Relevant anatomy identified.Needle position confirmed.Local anesthetic injected incrementally after negative aspiration. Local anesthetic spread visualized. Vascular puncture avoided. No complications. Image printed for medical record.The patient tolerated the procedure well.       Procedure Name: LMA Insertion Date/Time: 06/26/2014 9:15 AM Performed by: Maryella Shivers Pre-anesthesia Checklist: Patient identified, Emergency Drugs available, Suction available and Patient being monitored Patient Re-evaluated:Patient Re-evaluated prior to inductionOxygen Delivery Method: Circle System Utilized Preoxygenation: Pre-oxygenation with 100% oxygen Intubation Type: IV induction Ventilation: Mask ventilation without difficulty LMA: LMA inserted LMA Size: 4.0 Number of attempts: 1 Airway Equipment and Method: Bite block Placement Confirmation: positive ETCO2 Tube secured with: Tape Dental Injury: Teeth and Oropharynx as per pre-operative assessment

## 2014-06-26 NOTE — Anesthesia Postprocedure Evaluation (Signed)
Anesthesia Post Note  Patient: Regina Deleon  Procedure(s) Performed: Procedure(s) (LRB): MASTECTOMY MODIFIED RADICAL (Left) AXILLARY LYMPH NODE DISSECTION (Left)  Anesthesia type: general  Patient location: PACU  Post pain: Pain level controlled  Post assessment: Patient's Cardiovascular Status Stable  Last Vitals:  Filed Vitals:   06/26/14 1226  BP:   Pulse: 88  Temp:   Resp: 25    Post vital signs: Reviewed and stable  Level of consciousness: sedated  Complications: No apparent anesthesia complications

## 2014-06-26 NOTE — Anesthesia Preprocedure Evaluation (Signed)
Anesthesia Evaluation  Patient identified by MRN, date of birth, ID band  Reviewed: Allergy & Precautions, NPO status , Patient's Chart, lab work & pertinent test results  Airway Mallampati: I  TM Distance: >3 FB Neck ROM: Full    Dental   Pulmonary          Cardiovascular hypertension, Pt. on medications     Neuro/Psych    GI/Hepatic GERD-  Medicated,  Endo/Other    Renal/GU      Musculoskeletal   Abdominal   Peds  Hematology   Anesthesia Other Findings   Reproductive/Obstetrics                             Anesthesia Physical Anesthesia Plan  ASA: II  Anesthesia Plan: General   Post-op Pain Management:    Induction: Intravenous  Airway Management Planned: LMA  Additional Equipment:   Intra-op Plan:   Post-operative Plan: Extubation in OR  Informed Consent: I have reviewed the patients History and Physical, chart, labs and discussed the procedure including the risks, benefits and alternatives for the proposed anesthesia with the patient or authorized representative who has indicated his/her understanding and acceptance.     Plan Discussed with: CRNA and Surgeon  Anesthesia Plan Comments:         Anesthesia Quick Evaluation

## 2014-06-27 DIAGNOSIS — C50912 Malignant neoplasm of unspecified site of left female breast: Secondary | ICD-10-CM | POA: Diagnosis not present

## 2014-06-27 MED ORDER — HYDROCODONE-ACETAMINOPHEN 5-325 MG PO TABS: 1.0000 | ORAL_TABLET | ORAL | Status: DC | PRN

## 2014-06-27 NOTE — Discharge Instructions (Signed)
[facility] - DISCHARGE INSTRUCTIONS TO PATIENT  Activity:  Driving - May drive in 4 or 5 days, if doing well   Lifting - No lifting more than 15 puonds for 1 week, then no limit.  But use left arm as normal otherwise.  Wound Care:   Empty drains twice a day, record the amount.        You may shower on Friday, 2/26.  Diet:  As tolerated.  Follow up appointment:  You have an appt 3/3.  Call Dr. Pollie Friar office [facility]) at [phone] for problems.  Medications and dosages:  Resume your home medications.  You have a prescription for:  Vicodin  Call Dr. Lucia Gaskins or his office  [phone]) if you have:  Temperature greater than 100.4,  Persistent nausea and vomiting,  Severe uncontrolled pain,  Redness, tenderness, or signs of infection (pain, swelling, redness, odor or green/yellow discharge around the site),  Difficulty breathing, headache or visual disturbances,  Any other questions or concerns you may have after discharge.  In an emergency, call 911 or go to an Emergency Department at a nearby hospital.

## 2014-06-27 NOTE — Discharge Summary (Signed)
Physician Discharge Summary  Patient ID:  Regina Deleon  MRN: [id number]  DOB/AGE: 64/17/1952 64 y.o.  Admit date: 06/26/2014 Discharge date: 06/27/2014  Discharge Diagnoses:  1.  Left breast cancer  Final path pending  2.  Lap band - 10 cm - 10/19/2005 - D. Susano Cleckler Initial weight - 222, BMI 39.45 3. Hypertension 4. Hyperlipidemia. 5. Costochondritis, right costal margin   Active Problems:   Breast cancer, female   Operation: Procedure(s): Left MASTECTOMY MODIFIED RADICAL, Left AXILLARY LYMPH NODE DISSECTION on 06/26/2014 - D.[facility]  Discharged Condition: good  Hospital Course: Regina Deleon is an 64 y.o. female whose primary care physician is Lavera Guise, MD and who was admitted 06/26/2014 with a chief complaint of No chief complaint on file. Marland Kitchen   She was brought to the operating room on 06/26/2014 and underwent  Left MASTECTOMY MODIFIED RADICAL, Left AXILLARY LYMPH NODE DISSECTION. She had a good night.  She is ready to go home.  Her husband is at the bedside.  The discharge instructions were reviewed with the patient.  Consults: None  Significant Diagnostic Studies: Results for orders placed or performed in visit on 06/14/14  CBC with Differential  Result Value Ref Range   WBC 5.6 3.9 - 10.3 10e3/uL   NEUT# 3.5 1.5 - 6.5 10e3/uL   HGB 9.4 (L) 11.6 - 15.9 g/dL   HCT 30.2 (L) 34.8 - 46.6 %   Platelets 360 145 - 400 10e3/uL   MCV 101.0 79.5 - 101.0 fL   MCH 31.4 25.1 - 34.0 pg   MCHC 31.1 (L) 31.5 - 36.0 g/dL   RBC 2.99 (L) 3.70 - 5.45 10e6/uL   RDW 17.0 (H) 11.2 - 14.5 %   lymph# 1.6 0.9 - 3.3 10e3/uL   MONO# 0.4 0.1 - 0.9 10e3/uL   Eosinophils Absolute 0.1 0.0 - 0.5 10e3/uL   Basophils Absolute 0.0 0.0 - 0.1 10e3/uL   NEUT% 62.9 38.4 - 76.8 %   LYMPH% 29.1 14.0 - 49.7 %   MONO% 6.3 0.0 - 14.0 %   EOS% 1.2 0.0 - 7.0 %   BASO% 0.5 0.0 - 2.0 %  Comprehensive metabolic panel (Cmet) - [facility]  Result Value Ref Range   Sodium 144 136 - 145 mEq/L    Potassium 3.4 (L) 3.5 - 5.1 mEq/L   Chloride 108 98 - 109 mEq/L   CO2 28 22 - 29 mEq/L   Glucose 115 70 - 140 mg/dl   BUN 8.0 7.0 - 26.0 mg/dL   Creatinine 0.7 0.6 - 1.1 mg/dL   Total Bilirubin 0.32 0.20 - 1.20 mg/dL   Alkaline Phosphatase 78 40 - 150 U/L   AST 18 5 - 34 U/L   ALT 13 0 - 55 U/L   Total Protein 6.7 6.4 - 8.3 g/dL   Albumin 3.6 3.5 - 5.0 g/dL   Calcium 9.9 8.4 - 10.4 mg/dL   Anion Gap 8 3 - 11 mEq/L   EGFR >90 >90 ml/min/1.73 m2    Mr Breast Bilateral W Wo Contrast  06/07/2014   CLINICAL DATA:  Status post neoadjuvant treatment for left breast cancer.  LABS:  Not applicable  EXAM: BILATERAL BREAST MRI WITH AND WITHOUT CONTRAST  TECHNIQUE: Multiplanar, multisequence MR images of both breasts were obtained prior to and following the intravenous administration of 20 ml of Multihance.  THREE-DIMENSIONAL MR IMAGE RENDERING ON INDEPENDENT WORKSTATION:  Three-dimensional MR images were rendered by post-processing of the original MR data on an independent workstation. The  three-dimensional MR images were interpreted, and findings are reported in the following complete MRI report for this study. Three dimensional images were evaluated at the independent DynaCad workstation  COMPARISON:  Prior MRI exam 01/19/2014, MR guided core biopsy 01/25/2014, mammogram 01/10/2014 and earlier  FINDINGS: Breast composition: b.  Scattered fibroglandular tissue.  Background parenchymal enhancement: Moderate.  Right breast: No mass or abnormal enhancement. Right-sided Port-A-Cath in place.  Left breast: On noncontrast images there is persistent soft tissue signal intensity in the region of the biopsy clips measuring at least 2.1 x 3.1 cm. However the associated abnormal enhancement in the lateral portion of the breast has completely resolved.  Lymph nodes: Interval decrease in size of enlarged left axillary lymph nodes. No suspicious lymph nodes remain.  Ancillary findings: Partially imaged liver cyst is stable  in appearance.  IMPRESSION: Residual soft tissue signal intensity associated with biopsy marker clips in the lateral aspect of the left breast.  No residual enhancement in the lateral portion of the left breast.  RECOMMENDATION: Treatment plan is recommended.  BI-RADS CATEGORY  6: Known biopsy-proven malignancy.   Electronically Signed   By: Shon Hale M.D.   On: 06/07/2014 11:51    Discharge Exam:  Filed Vitals:   06/27/14 0800  BP:   Pulse: 88  Temp:   Resp: 16    General: WN obese AA F who is alert and generally healthy appearing.  Lungs: Clear to auscultation and symmetric breath sounds. Heart:  RRR. No murmur or rub. Chest:  Her dressing looks good.  Her drains look good.  Discharge Medications:     Medication List    TAKE these medications        acetaminophen 500 MG tablet  Commonly known as:  TYLENOL  Take 500 mg by mouth every 6 (six) hours as needed for mild pain.     aspirin 81 MG tablet  Take 81 mg by mouth every morning.     cyclobenzaprine 10 MG tablet  Commonly known as:  FLEXERIL     fluticasone 50 MCG/ACT nasal spray  Commonly known as:  FLONASE  Place 2 sprays into both nostrils daily as needed for allergies or rhinitis.     furosemide 20 MG tablet  Commonly known as:  LASIX  Take 1 tablet (20 mg total) by mouth daily.     HYDROcodone-acetaminophen 5-325 MG per tablet  Commonly known as:  NORCO/VICODIN     HYDROcodone-acetaminophen 5-325 MG per tablet  Commonly known as:  NORCO/VICODIN  Take 1-2 tablets by mouth every 4 (four) hours as needed for moderate pain.     lidocaine-prilocaine cream  Commonly known as:  EMLA  Apply 1 application topically as needed.     LORazepam 0.5 MG tablet  Commonly known as:  ATIVAN  Take 1 tablet (0.5 mg total) by mouth every 6 (six) hours as needed (Nausea or vomiting).     NIFEdipine 30 MG 24 hr tablet  Commonly known as:  PROCARDIA-XL/ADALAT-CC/NIFEDICAL-XL  Take 30 mg by mouth 2 (two) times daily.      OMEGA 3 PO  Take 2 tablets by mouth every morning.     prochlorperazine 10 MG tablet  Commonly known as:  COMPAZINE  Take 1 tablet (10 mg total) by mouth every 6 (six) hours as needed (Nausea or vomiting).     ranitidine 150 MG tablet  Commonly known as:  ZANTAC  Take 150 mg by mouth 2 (two) times daily.     triamterene-hydrochlorothiazide 37.5-25  MG per tablet  Commonly known as:  MAXZIDE-25  Take 0.5 tablets by mouth every morning. As needed.     zolpidem 10 MG tablet  Commonly known as:  AMBIEN  Take 10 mg by mouth at bedtime as needed for sleep.        Disposition:       Discharge Instructions    Diet - low sodium heart healthy    Complete by:  As directed      Increase activity slowly    Complete by:  As directed             Activity: Driving - May drive in 4 or 5 days, if doing well  Lifting - No lifting more than 15 puonds for 1 week, then no limit. But use left arm as normal otherwise.   Wound Care: Empty drains twice a day, record the amount.  You may shower on Friday, 2/26.   Diet: As tolerated.   Follow up appointment: You have an appt 3/3. Call Dr. Pollie Friar office [facility]) at [phone] for problems.   Medications and dosages:  Resume your home medications.  You have a prescription for: Vicodin  Signed: Alphonsa Overall, M.D., [facility] Office:  [phone]  06/27/2014, 8:21 AM

## 2014-06-28 ENCOUNTER — Encounter (HOSPITAL_BASED_OUTPATIENT_CLINIC_OR_DEPARTMENT_OTHER): Payer: Self-pay | Admitting: Surgery

## 2014-07-02 LAB — POCT HEMOGLOBIN-HEMACUE: Hemoglobin: 10.2 g/dL — ABNORMAL LOW (ref 12.0–15.0)

## 2014-07-04 ENCOUNTER — Telehealth: Payer: Self-pay | Admitting: *Deleted

## 2014-07-04 ENCOUNTER — Other Ambulatory Visit: Payer: Self-pay

## 2014-07-04 DIAGNOSIS — C50212 Malignant neoplasm of upper-inner quadrant of left female breast: Secondary | ICD-10-CM

## 2014-07-04 NOTE — Telephone Encounter (Signed)
Patient called to see if she should keep her appointment with Dr. Lindi Adie tomorrow.  She said she had some swelling in her hands and feet after her Herceptin each time.  Strongly encouraged her to keep that appointment and to discuss those symptoms with Dr. Lindi Adie at that appointment.

## 2014-07-05 ENCOUNTER — Other Ambulatory Visit: Payer: BC Managed Care – PPO

## 2014-07-05 ENCOUNTER — Other Ambulatory Visit: Payer: Self-pay | Admitting: *Deleted

## 2014-07-05 ENCOUNTER — Telehealth: Payer: Self-pay | Admitting: Hematology and Oncology

## 2014-07-05 ENCOUNTER — Other Ambulatory Visit (HOSPITAL_BASED_OUTPATIENT_CLINIC_OR_DEPARTMENT_OTHER): Payer: BC Managed Care – PPO

## 2014-07-05 ENCOUNTER — Ambulatory Visit (HOSPITAL_BASED_OUTPATIENT_CLINIC_OR_DEPARTMENT_OTHER): Payer: BC Managed Care – PPO | Admitting: Hematology and Oncology

## 2014-07-05 ENCOUNTER — Telehealth: Payer: Self-pay | Admitting: *Deleted

## 2014-07-05 ENCOUNTER — Encounter: Payer: Self-pay | Admitting: *Deleted

## 2014-07-05 ENCOUNTER — Ambulatory Visit (HOSPITAL_BASED_OUTPATIENT_CLINIC_OR_DEPARTMENT_OTHER): Payer: BC Managed Care – PPO

## 2014-07-05 VITALS — BP 126/87 | HR 101 | Temp 98.4°F | Resp 18 | Ht 63.0 in | Wt 196.8 lb

## 2014-07-05 DIAGNOSIS — Z5112 Encounter for antineoplastic immunotherapy: Secondary | ICD-10-CM

## 2014-07-05 DIAGNOSIS — M7989 Other specified soft tissue disorders: Secondary | ICD-10-CM | POA: Diagnosis not present

## 2014-07-05 DIAGNOSIS — C50212 Malignant neoplasm of upper-inner quadrant of left female breast: Secondary | ICD-10-CM

## 2014-07-05 LAB — CBC WITH DIFFERENTIAL/PLATELET
BASO%: 0.5 % (ref 0.0–2.0)
BASOS ABS: 0 10*3/uL (ref 0.0–0.1)
EOS%: 3.5 % (ref 0.0–7.0)
Eosinophils Absolute: 0.2 10*3/uL (ref 0.0–0.5)
HEMATOCRIT: 33.1 % — AB (ref 34.8–46.6)
HGB: 10.2 g/dL — ABNORMAL LOW (ref 11.6–15.9)
LYMPH%: 30 % (ref 14.0–49.7)
MCH: 30.3 pg (ref 25.1–34.0)
MCHC: 30.8 g/dL — ABNORMAL LOW (ref 31.5–36.0)
MCV: 98.2 fL (ref 79.5–101.0)
MONO#: 0.4 10*3/uL (ref 0.1–0.9)
MONO%: 6.6 % (ref 0.0–14.0)
NEUT#: 3.9 10*3/uL (ref 1.5–6.5)
NEUT%: 59.4 % (ref 38.4–76.8)
Platelets: 286 10*3/uL (ref 145–400)
RBC: 3.37 10*6/uL — ABNORMAL LOW (ref 3.70–5.45)
RDW: 14.5 % (ref 11.2–14.5)
WBC: 6.5 10*3/uL (ref 3.9–10.3)
lymph#: 2 10*3/uL (ref 0.9–3.3)

## 2014-07-05 LAB — COMPREHENSIVE METABOLIC PANEL (CC13)
ALT: 12 U/L (ref 0–55)
AST: 18 U/L (ref 5–34)
Albumin: 3.4 g/dL — ABNORMAL LOW (ref 3.5–5.0)
Alkaline Phosphatase: 79 U/L (ref 40–150)
Anion Gap: 8 mEq/L (ref 3–11)
BILIRUBIN TOTAL: 0.35 mg/dL (ref 0.20–1.20)
BUN: 7.3 mg/dL (ref 7.0–26.0)
CALCIUM: 10.6 mg/dL — AB (ref 8.4–10.4)
CHLORIDE: 106 meq/L (ref 98–109)
CO2: 29 mEq/L (ref 22–29)
CREATININE: 0.7 mg/dL (ref 0.6–1.1)
EGFR: >90 mL/min/{1.73_m2} (ref >90)
Glucose: 118 mg/dl (ref 70–140)
Potassium: 3.6 mEq/L (ref 3.5–5.1)
Sodium: 143 mEq/L (ref 136–145)
Total Protein: 6.5 g/dL (ref 6.4–8.3)

## 2014-07-05 MED ORDER — FUROSEMIDE 20 MG PO TABS: 20.0000 mg | ORAL_TABLET | Freq: Every day | ORAL | Status: DC

## 2014-07-05 MED ORDER — SODIUM CHLORIDE 0.9 % IJ SOLN
10.0000 mL | INTRAMUSCULAR | Status: DC | PRN
  Administered 2014-07-05: 10 mL
  Filled 2014-07-05: qty 10

## 2014-07-05 MED ORDER — SODIUM CHLORIDE 0.9 % IV SOLN
Freq: Once | INTRAVENOUS | Status: AC
  Administered 2014-07-05: 12:00:00 via INTRAVENOUS

## 2014-07-05 MED ORDER — ACETAMINOPHEN 325 MG PO TABS
ORAL_TABLET | ORAL | Status: AC
  Filled 2014-07-05: qty 2

## 2014-07-05 MED ORDER — TRASTUZUMAB CHEMO INJECTION 440 MG
6.0000 mg/kg | Freq: Once | INTRAVENOUS | Status: AC
  Administered 2014-07-05: 546 mg via INTRAVENOUS
  Filled 2014-07-05: qty 26

## 2014-07-05 MED ORDER — DIPHENHYDRAMINE HCL 25 MG PO CAPS
ORAL_CAPSULE | ORAL | Status: AC
  Filled 2014-07-05: qty 2

## 2014-07-05 MED ORDER — DIPHENHYDRAMINE HCL 25 MG PO CAPS
50.0000 mg | ORAL_CAPSULE | Freq: Once | ORAL | Status: AC
Start: 2014-07-05 — End: 2014-07-05
  Administered 2014-07-05: 50 mg via ORAL

## 2014-07-05 MED ORDER — ACETAMINOPHEN 325 MG PO TABS
650.0000 mg | ORAL_TABLET | Freq: Once | ORAL | Status: AC
  Administered 2014-07-05: 650 mg via ORAL

## 2014-07-05 MED ORDER — HEPARIN SOD (PORK) LOCK FLUSH 100 UNIT/ML IV SOLN
500.0000 [IU] | Freq: Once | INTRAVENOUS | Status: AC | PRN
  Administered 2014-07-05: 500 [IU]
  Filled 2014-07-05: qty 5

## 2014-07-05 NOTE — Progress Notes (Signed)
Patient Care Team: Lavera Guise, MD as PCP - General (Internal Medicine) Rulon Eisenmenger, MD as Consulting Physician (Hematology and Oncology) Eppie Gibson, MD as Attending Physician (Radiation Oncology) Alphonsa Overall, MD as Consulting Physician (General Surgery)  DIAGNOSIS: Breast cancer of upper-inner quadrant of left female breast   Staging form: Breast, AJCC 7th Edition     Clinical: Stage IIB (T2, N1, cM0) - Signed by Eppie Gibson, MD on 01/31/2014     Pathologic: cM0 - Unsigned   SUMMARY OF ONCOLOGIC HISTORY:   Breast cancer of upper-inner quadrant of left female breast   12/27/2013 Mammogram Mammogram and ultrasound 1.8 cm mass left breast 3:00; 7 cm from nipple with abnormal 1 left axillary lymph node, 2.8 x 1.8 x 1.7 cm, second adjacent smaller nodule 1.2 x 1 x 0.7 cm   01/10/2014 Initial Diagnosis 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        1 AxLN biopsy: ER 70% PR 0% Ki-67 80% HER-2 +2.92, gene copy #5.5   01/19/2014 Breast MRI Left breast: 17 mm mass, with non-mass enhancement both anterior and posterior to the biopsy clip extends 3.2 cm anterior and 2.8 Sinemet as medial to the mass, 1 cm satellite nodule, 2 abnormal lymph nodes left axilla 3.8 cm, 1.4 cm   02/09/2014 - 05/24/2014 Neo-Adjuvant Chemotherapy Neoadjuvant chemotherapy with Taxotere, carboplatin, Herceptin and Perjeta x6 cycles   06/07/2014 Breast MRI Left breast: Soft tissue signal 2.1 x 3.1 cm, resolution of enhancement in the lateral portion of the breast, interval decrease in size of left axillary lymph nodes, no suspicious lymph nodes remain   06/26/2014 Surgery Pathologic complete response, 0/20 lymph nodes negative    CHIEF COMPLIANT: Follow-up after surgery and Herceptin maintenance  INTERVAL HISTORY: Regina Deleon is a 64 year old African American lady with above-mentioned history of left-sided breast cancer treated with neoadjuvant chemotherapy and underwent mastectomy with  axillary lymph node dissection. She had a complete pathologic response without any evidence of cancer in any of the lymph nodes. She is recovering from the surgery and still has a drain. She is ecstatic hearing the results of the pathology report.   REVIEW OF SYSTEMS:   Constitutional: Denies fevers, chills or abnormal weight loss Eyes: Denies blurriness of vision Ears, nose, mouth, throat, and face: Denies mucositis or sore throat Respiratory: Denies cough, dyspnea or wheezes Cardiovascular: Denies palpitation, chest discomfort or lower extremity swelling Gastrointestinal:  Denies nausea, heartburn or change in bowel habits Skin: Denies abnormal skin rashes Lymphatics: Denies new lymphadenopathy or easy bruising Neurological:Denies numbness, tingling or new weaknesses Behavioral/Psych: Mood is stable, no new changes  Breast: Breast is slightly sore from recent surgery  All other systems were reviewed with the patient and are negative.  I have reviewed the past medical history, past surgical history, social history and family history with the patient and they are unchanged from previous note.  ALLERGIES:  is allergic to darvocet; latex; enalapril; and penicillins.  MEDICATIONS:  Current Outpatient Prescriptions  Medication Sig Dispense Refill  . acetaminophen (TYLENOL) 500 MG tablet Take 500 mg by mouth every 6 (six) hours as needed for mild pain.    Marland Kitchen aspirin 81 MG tablet Take 81 mg by mouth every morning.     . cyclobenzaprine (FLEXERIL) 10 MG tablet     . fluticasone (FLONASE) 50 MCG/ACT nasal spray Place 2 sprays into both nostrils daily as needed for allergies or rhinitis.    Marland Kitchen  furosemide (LASIX) 20 MG tablet Take 1 tablet (20 mg total) by mouth daily. 30 tablet 0  . HYDROcodone-acetaminophen (NORCO/VICODIN) 5-325 MG per tablet     . HYDROcodone-acetaminophen (NORCO/VICODIN) 5-325 MG per tablet Take 1-2 tablets by mouth every 4 (four) hours as needed for moderate pain. 30 tablet 0   . lidocaine-prilocaine (EMLA) cream Apply 1 application topically as needed. 30 g 0  . LORazepam (ATIVAN) 0.5 MG tablet Take 1 tablet (0.5 mg total) by mouth every 6 (six) hours as needed (Nausea or vomiting). 30 tablet 0  . NIFEdipine (PROCARDIA-XL/ADALAT-CC/NIFEDICAL-XL) 30 MG 24 hr tablet Take 30 mg by mouth 2 (two) times daily.     . Omega-3 Fatty Acids (OMEGA 3 PO) Take 2 tablets by mouth every morning.    . ranitidine (ZANTAC) 150 MG tablet Take 150 mg by mouth 2 (two) times daily.    Marland Kitchen triamcinolone cream (KENALOG) 0.5 %   0  . triamterene-hydrochlorothiazide (MAXZIDE-25) 37.5-25 MG per tablet Take 0.5 tablets by mouth every morning. As needed.    . zolpidem (AMBIEN) 10 MG tablet Take 10 mg by mouth at bedtime as needed for sleep.    . furosemide (LASIX) 20 MG tablet Take 1 tablet (20 mg total) by mouth daily. 30 tablet 0   No current facility-administered medications for this visit.    PHYSICAL EXAMINATION: ECOG PERFORMANCE STATUS: 1 - Symptomatic but completely ambulatory  Filed Vitals:   07/05/14 1020  BP: 126/87  Pulse: 101  Temp: 98.4 F (36.9 C)  Resp: 18   Filed Weights   07/05/14 1020  Weight: 196 lb 12.8 oz (89.268 kg)    GENERAL:alert, no distress and comfortable SKIN: skin color, texture, turgor are normal, no rashes or significant lesions EYES: normal, Conjunctiva are pink and non-injected, sclera clear OROPHARYNX:no exudate, no erythema and lips, buccal mucosa, and tongue normal  NECK: supple, thyroid normal size, non-tender, without nodularity LYMPH:  no palpable lymphadenopathy in the cervical, axillary or inguinal LUNGS: clear to auscultation and percussion with normal breathing effort HEART: regular rate & rhythm and no murmurs and no lower extremity edema ABDOMEN:abdomen soft, non-tender and normal bowel sounds Musculoskeletal:no cyanosis of digits and no clubbing  NEURO: alert & oriented x 3 with fluent speech, no focal motor/sensory  deficits   LABORATORY DATA:  I have reviewed the data as listed   Chemistry      Component Value Date/Time   NA 143 07/05/2014 1010   K 3.6 07/05/2014 1010   CO2 29 07/05/2014 1010   BUN 7.3 07/05/2014 1010   CREATININE 0.7 07/05/2014 1010      Component Value Date/Time   CALCIUM 10.6* 07/05/2014 1010   ALKPHOS 79 07/05/2014 1010   AST 18 07/05/2014 1010   ALT 12 07/05/2014 1010   BILITOT 0.35 07/05/2014 1010       Lab Results  Component Value Date   WBC 6.5 07/05/2014   HGB 10.2* 07/05/2014   HCT 33.1* 07/05/2014   MCV 98.2 07/05/2014   PLT 286 07/05/2014   NEUTROABS 3.9 07/05/2014    ASSESSMENT & PLAN:  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 AxLN biopsy: ER 70% PR 0% Ki-67 80% HER-2 +2.92, gene copy #5.5 T2, N1, M0 stage IIB Status post neoadjuvant chemotherapy with TCH Perjeta completed 05/24/2014 (last cycle was given without chemotherapy) Left breast mastectomy 06/26/2014: Complete pathologic response, 20  lymph nodes negative  Current treatment: Herceptin maintenance every 3 weeks  Herceptin toxicities: 1. Echocardiogram done January 2016 normal Echocardiogram to be done in April 2016 2. Patient experienced leg swelling with Herceptin which resolved with Lasix. I renewed her Lasix prescription today.  Return to clinic every 3 weeks for Herceptin and in 6 weeks for clinic follow-up.    No orders of the defined types were placed in this encounter.   The patient has a good understanding of the overall plan. she agrees with it. She will call with any problems that may develop before her next visit here.   Rulon Eisenmenger, MD

## 2014-07-05 NOTE — Telephone Encounter (Signed)
per pof ot sch pt appt-sent MW email to sch pt appt-gave pt copy of sch

## 2014-07-05 NOTE — Patient Instructions (Signed)
[facility] Discharge Instructions for Patients Receiving Chemotherapy  Today you received the following chemotherapy agents herceptin   To help prevent nausea and vomiting after your treatment, we encourage you to take your nausea medication as directed   If you develop nausea and vomiting that is not controlled by your nausea medication, call the clinic.   BELOW ARE SYMPTOMS THAT SHOULD BE REPORTED IMMEDIATELY:  *FEVER GREATER THAN 100.5 F  *CHILLS WITH OR WITHOUT FEVER  NAUSEA AND VOMITING THAT IS NOT CONTROLLED WITH YOUR NAUSEA MEDICATION  *UNUSUAL SHORTNESS OF BREATH  *UNUSUAL BRUISING OR BLEEDING  TENDERNESS IN MOUTH AND THROAT WITH OR WITHOUT PRESENCE OF ULCERS  *URINARY PROBLEMS  *BOWEL PROBLEMS  UNUSUAL RASH Items with * indicate a potential emergency and should be followed up as soon as possible.  Feel free to call the clinic you have any questions or concerns. The clinic phone number is [phone].  

## 2014-07-05 NOTE — Assessment & Plan Note (Signed)
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 AxLN biopsy: ER 70% PR 0% Ki-67 80% HER-2 +2.92, gene copy #5.5 T2, N1, M0 stage IIB Status post neoadjuvant chemotherapy with TCH Perjeta completed 05/24/2014 (last cycle was given without chemotherapy) Left breast mastectomy 06/26/2014: Complete pathologic response, 20 lymph nodes negative  Current treatment: Herceptin maintenance every 3 weeks  Herceptin toxicities: 1. Echocardiogram done January 2016 normal Echocardiogram to be done in April 2016 2. Patient experienced leg swelling with Herceptin which resolved with Lasix. I renewed her Lasix prescription today.  Return to clinic every 3 weeks for Herceptin and in 6 weeks for clinic follow-up.

## 2014-07-05 NOTE — Telephone Encounter (Signed)
Per staff message and POF I have scheduled appts. Advised scheduler of appts. JMW  

## 2014-07-25 ENCOUNTER — Telehealth: Payer: Self-pay

## 2014-07-25 ENCOUNTER — Other Ambulatory Visit: Payer: Self-pay

## 2014-07-25 ENCOUNTER — Telehealth: Payer: Self-pay | Admitting: Hematology and Oncology

## 2014-07-25 NOTE — Telephone Encounter (Signed)
Per pof patient called to cancel her lab and tx on 3/24, i have left her a message to call and let me now when she would like to r/s to     [employer]

## 2014-07-25 NOTE — Telephone Encounter (Signed)
Pt called stating she wants to cancel tomorrow's appt for lab and herceptin. Her arm is swollen and painful from her mastectomy. She saw Dr Pollie Friar associate yesterday and they said it will take a few weeks to feel better. Her feet are cold and painful from her neuropathy. She is taking Neurontin and B vitamin complex. She has not slept well all week. "I just don't want to be bothered". Will route this note to [place] and Therapist, sports.

## 2014-07-26 ENCOUNTER — Ambulatory Visit: Payer: BC Managed Care – PPO

## 2014-07-26 ENCOUNTER — Other Ambulatory Visit: Payer: Self-pay

## 2014-08-07 ENCOUNTER — Telehealth: Payer: Self-pay

## 2014-08-07 NOTE — Telephone Encounter (Signed)
Order faxed to [facility].  Sent to scan. 

## 2014-08-15 ENCOUNTER — Other Ambulatory Visit: Payer: Self-pay | Admitting: *Deleted

## 2014-08-15 DIAGNOSIS — C50212 Malignant neoplasm of upper-inner quadrant of left female breast: Secondary | ICD-10-CM

## 2014-08-16 ENCOUNTER — Other Ambulatory Visit: Payer: BC Managed Care – PPO

## 2014-08-16 ENCOUNTER — Other Ambulatory Visit (HOSPITAL_COMMUNITY)
Admission: AD | Admit: 2014-08-16 | Discharge: 2014-08-16 | Disposition: A | Discharge status: Home / Self Care | Payer: BC Managed Care – PPO | Source: Ambulatory Visit | Attending: Hematology and Oncology | Admitting: Hematology and Oncology

## 2014-08-16 ENCOUNTER — Telehealth: Payer: Self-pay | Admitting: Hematology and Oncology

## 2014-08-16 ENCOUNTER — Ambulatory Visit (HOSPITAL_BASED_OUTPATIENT_CLINIC_OR_DEPARTMENT_OTHER): Payer: BC Managed Care – PPO

## 2014-08-16 ENCOUNTER — Ambulatory Visit (HOSPITAL_BASED_OUTPATIENT_CLINIC_OR_DEPARTMENT_OTHER): Payer: BC Managed Care – PPO | Admitting: Hematology and Oncology

## 2014-08-16 ENCOUNTER — Other Ambulatory Visit (HOSPITAL_BASED_OUTPATIENT_CLINIC_OR_DEPARTMENT_OTHER): Payer: BC Managed Care – PPO

## 2014-08-16 ENCOUNTER — Encounter: Payer: Self-pay | Admitting: *Deleted

## 2014-08-16 VITALS — BP 136/72 | HR 84 | Temp 98.0°F | Resp 18 | Ht 63.0 in | Wt 195.6 lb

## 2014-08-16 DIAGNOSIS — Z5112 Encounter for antineoplastic immunotherapy: Secondary | ICD-10-CM

## 2014-08-16 DIAGNOSIS — C50512 Malignant neoplasm of lower-outer quadrant of left female breast: Secondary | ICD-10-CM | POA: Diagnosis not present

## 2014-08-16 DIAGNOSIS — C50212 Malignant neoplasm of upper-inner quadrant of left female breast: Secondary | ICD-10-CM

## 2014-08-16 DIAGNOSIS — G629 Polyneuropathy, unspecified: Secondary | ICD-10-CM | POA: Diagnosis not present

## 2014-08-16 DIAGNOSIS — C50412 Malignant neoplasm of upper-outer quadrant of left female breast: Secondary | ICD-10-CM | POA: Diagnosis not present

## 2014-08-16 LAB — CBC WITH DIFFERENTIAL/PLATELET
BASO%: 0.3 % (ref 0.0–2.0)
BASOS ABS: 0 10*3/uL (ref 0.0–0.1)
EOS%: 0.8 % (ref 0.0–7.0)
Eosinophils Absolute: 0.1 10*3/uL (ref 0.0–0.5)
HCT: 34.8 % (ref 34.8–46.6)
HEMOGLOBIN: 11 g/dL — AB (ref 11.6–15.9)
LYMPH#: 2.3 10*3/uL (ref 0.9–3.3)
LYMPH%: 31.5 % (ref 14.0–49.7)
MCH: 28.5 pg (ref 25.1–34.0)
MCHC: 31.6 g/dL (ref 31.5–36.0)
MCV: 90.2 fL (ref 79.5–101.0)
MONO#: 0.5 10*3/uL (ref 0.1–0.9)
MONO%: 6.6 % (ref 0.0–14.0)
NEUT%: 60.8 % (ref 38.4–76.8)
NEUTROS ABS: 4.4 10*3/uL (ref 1.5–6.5)
Platelets: 289 10*3/uL (ref 145–400)
RBC: 3.86 10*6/uL (ref 3.70–5.45)
RDW: 14.2 % (ref 11.2–14.5)
WBC: 7.3 10*3/uL (ref 3.9–10.3)

## 2014-08-16 LAB — COMPREHENSIVE METABOLIC PANEL
ALT: 20 U/L (ref 0–35)
AST: 24 U/L (ref 0–37)
Albumin: 4.1 g/dL (ref 3.5–5.2)
Alkaline Phosphatase: 86 U/L (ref 39–117)
Anion gap: 8 (ref 5–15)
BUN: 14 mg/dL (ref 6–23)
CALCIUM: 10.5 mg/dL (ref 8.4–10.5)
CO2: 28 mmol/L (ref 19–32)
CREATININE: 0.72 mg/dL (ref 0.50–1.10)
Chloride: 105 mmol/L (ref 96–112)
GFR calc non Af Amer: 89 mL/min — ABNORMAL LOW (ref >90)
GLUCOSE: 96 mg/dL (ref 70–99)
Potassium: 3.5 mmol/L (ref 3.5–5.1)
SODIUM: 141 mmol/L (ref 135–145)
TOTAL PROTEIN: 7.6 g/dL (ref 6.0–8.3)
Total Bilirubin: 0.2 mg/dL — ABNORMAL LOW (ref 0.3–1.2)

## 2014-08-16 MED ORDER — DIPHENHYDRAMINE HCL 25 MG PO CAPS
50.0000 mg | ORAL_CAPSULE | Freq: Once | ORAL | Status: AC
  Administered 2014-08-16: 50 mg via ORAL

## 2014-08-16 MED ORDER — FUROSEMIDE 20 MG PO TABS: 20.0000 mg | ORAL_TABLET | Freq: Every day | ORAL | Status: DC

## 2014-08-16 MED ORDER — SODIUM CHLORIDE 0.9 % IJ SOLN
10.0000 mL | INTRAMUSCULAR | Status: DC | PRN
  Administered 2014-08-16: 10 mL
  Filled 2014-08-16: qty 10

## 2014-08-16 MED ORDER — ANASTROZOLE 1 MG PO TABS: 1.0000 mg | ORAL_TABLET | Freq: Every day | ORAL | Status: DC

## 2014-08-16 MED ORDER — HEPARIN SOD (PORK) LOCK FLUSH 100 UNIT/ML IV SOLN
500.0000 [IU] | Freq: Once | INTRAVENOUS | Status: AC | PRN
  Administered 2014-08-16: 500 [IU]
  Filled 2014-08-16: qty 5

## 2014-08-16 MED ORDER — LIDOCAINE-PRILOCAINE 2.5-2.5 % EX CREA: 1.0000 "application " | TOPICAL_CREAM | CUTANEOUS | Status: DC | PRN

## 2014-08-16 MED ORDER — TRASTUZUMAB CHEMO INJECTION 440 MG
6.0000 mg/kg | Freq: Once | INTRAVENOUS | Status: AC
  Administered 2014-08-16: 546 mg via INTRAVENOUS
  Filled 2014-08-16: qty 26

## 2014-08-16 MED ORDER — SODIUM CHLORIDE 0.9 % IV SOLN
Freq: Once | INTRAVENOUS | Status: AC
  Administered 2014-08-16: 11:00:00 via INTRAVENOUS

## 2014-08-16 MED ORDER — ACETAMINOPHEN 325 MG PO TABS
650.0000 mg | ORAL_TABLET | Freq: Once | ORAL | Status: AC
  Administered 2014-08-16: 650 mg via ORAL

## 2014-08-16 MED ORDER — DIPHENHYDRAMINE HCL 25 MG PO CAPS
ORAL_CAPSULE | ORAL | Status: AC
  Filled 2014-08-16: qty 2

## 2014-08-16 MED ORDER — ACETAMINOPHEN 325 MG PO TABS
ORAL_TABLET | ORAL | Status: AC
  Filled 2014-08-16: qty 2

## 2014-08-16 NOTE — Telephone Encounter (Signed)
Appointments made and patient will get a new avs in chemo °

## 2014-08-16 NOTE — Progress Notes (Signed)
Patient Care Team: Lavera Guise, MD as PCP - General (Internal Medicine) Nicholas Lose, MD as Consulting Physician (Hematology and Oncology) Eppie Gibson, MD as Attending Physician (Radiation Oncology) Alphonsa Overall, MD as Consulting Physician (General Surgery)  DIAGNOSIS: Breast cancer of upper-inner quadrant of left female breast   Staging form: Breast, AJCC 7th Edition     Clinical: Stage IIB (T2, N1, cM0) - Signed by Eppie Gibson, MD on 01/31/2014     Pathologic: cM0 - Unsigned   SUMMARY OF ONCOLOGIC HISTORY:   Breast cancer of upper-inner quadrant of left female breast   12/27/2013 Mammogram Mammogram and ultrasound 1.8 cm mass left breast 3:00; 7 cm from nipple with abnormal 1 left axillary lymph node, 2.8 x 1.8 x 1.7 cm, second adjacent smaller nodule 1.2 x 1 x 0.7 cm   01/10/2014 Initial Diagnosis 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        1 AxLN biopsy: ER 70% PR 0% Ki-67 80% HER-2 +2.92, gene copy #5.5   01/19/2014 Breast MRI Left breast: 17 mm mass, with non-mass enhancement both anterior and posterior to the biopsy clip extends 3.2 cm anterior and 2.8 Sinemet as medial to the mass, 1 cm satellite nodule, 2 abnormal lymph nodes left axilla 3.8 cm, 1.4 cm   02/09/2014 - 05/24/2014 Neo-Adjuvant Chemotherapy Neoadjuvant chemotherapy with Taxotere, carboplatin, Herceptin and Perjeta x6 cycles   06/07/2014 Breast MRI Left breast: Soft tissue signal 2.1 x 3.1 cm, resolution of enhancement in the lateral portion of the breast, interval decrease in size of left axillary lymph nodes, no suspicious lymph nodes remain   06/26/2014 Surgery Pathologic complete response, 0/20 lymph nodes negative    CHIEF COMPLIANT: Pain in the left arm  INTERVAL HISTORY: Regina Deleon is a C21-year-old with above-mentioned history of left-sided breast, treated with neoadjuvant chemotherapy followed by mastectomy and had a complete pathologic response. She is here to continue with  Herceptin maintenance. She complains of pain in the left arm as well as swelling. She has seen a physical therapist to prescribe her lymphedema sleeve. She still has significant difficulty with pain. She had seen a surgeon who prescribed Neurontin. But she stopped taking it. For some odd reason. She will go back to taking it after I discussed with her that it is helpful if she would take it on a continuous basis.  REVIEW OF SYSTEMS:   Constitutional: Denies fevers, chills or abnormal weight loss Eyes: Denies blurriness of vision Ears, nose, mouth, throat, and face: Denies mucositis or sore throat Respiratory: Denies cough, dyspnea or wheezes Cardiovascular: Denies palpitation, chest discomfort or lower extremity swelling Gastrointestinal:  Denies nausea, heartburn or change in bowel habits Skin: Denies abnormal skin rashes Lymphatics: Denies new lymphadenopathy or easy bruising Neurological:Denies numbness, tingling or new weaknesses Behavioral/Psych: Mood is stable, no new changes  Breast: Pain in the left axilla and swelling All other systems were reviewed with the patient and are negative.  I have reviewed the past medical history, past surgical history, social history and family history with the patient and they are unchanged from previous note.  ALLERGIES:  is allergic to darvocet; latex; enalapril; and penicillins.  MEDICATIONS:  Current Outpatient Prescriptions  Medication Sig Dispense Refill  . acetaminophen (TYLENOL) 500 MG tablet Take 500 mg by mouth every 6 (six) hours as needed for mild pain.    Marland Kitchen aspirin 81 MG tablet Take 81 mg by mouth every morning.     Marland Kitchen  cyclobenzaprine (FLEXERIL) 10 MG tablet     . fluticasone (FLONASE) 50 MCG/ACT nasal spray Place 2 sprays into both nostrils daily as needed for allergies or rhinitis.    . furosemide (LASIX) 20 MG tablet Take 1 tablet (20 mg total) by mouth daily. 30 tablet 0  . gabapentin (NEURONTIN) 300 MG capsule Take 300 mg by mouth 2  (two) times daily.  1  . lidocaine-prilocaine (EMLA) cream Apply 1 application topically as needed. 30 g 0  . LORazepam (ATIVAN) 0.5 MG tablet Take 1 tablet (0.5 mg total) by mouth every 6 (six) hours as needed (Nausea or vomiting). 30 tablet 0  . NIFEdipine (PROCARDIA-XL/ADALAT-CC/NIFEDICAL-XL) 30 MG 24 hr tablet Take 30 mg by mouth 2 (two) times daily.     . Omega-3 Fatty Acids (OMEGA 3 PO) Take 2 tablets by mouth every morning.    . ranitidine (ZANTAC) 150 MG tablet Take 150 mg by mouth 2 (two) times daily.    Marland Kitchen triamcinolone cream (KENALOG) 0.5 %   0  . triamterene-hydrochlorothiazide (MAXZIDE-25) 37.5-25 MG per tablet Take 0.5 tablets by mouth every morning. As needed.    . zolpidem (AMBIEN) 10 MG tablet Take 10 mg by mouth at bedtime as needed for sleep.    Marland Kitchen anastrozole (ARIMIDEX) 1 MG tablet Take 1 tablet (1 mg total) by mouth daily. 90 tablet 3   No current facility-administered medications for this visit.    PHYSICAL EXAMINATION: ECOG PERFORMANCE STATUS: 1 - Symptomatic but completely ambulatory  Filed Vitals:   08/16/14 0924  BP: 136/72  Pulse: 84  Temp: 98 F (36.7 C)  Resp: 18   Filed Weights   08/16/14 0924  Weight: 195 lb 9.6 oz (88.724 kg)    GENERAL:alert, no distress and comfortable SKIN: skin color, texture, turgor are normal, no rashes or significant lesions EYES: normal, Conjunctiva are pink and non-injected, sclera clear OROPHARYNX:no exudate, no erythema and lips, buccal mucosa, and tongue normal  NECK: supple, thyroid normal size, non-tender, without nodularity LYMPH:  no palpable lymphadenopathy in the cervical, axillary or inguinal LUNGS: clear to auscultation and percussion with normal breathing effort HEART: regular rate & rhythm and no murmurs and no lower extremity edema ABDOMEN:abdomen soft, non-tender and normal bowel sounds Musculoskeletal:no cyanosis of digits and no clubbing  NEURO: alert & oriented x 3 with fluent speech, no focal  motor/sensory deficits BREAST: Left mastectomy with discomfort in the axilla and swelling (exam performed in the presence of a chaperone)  LABORATORY DATA:  I have reviewed the data as listed   Chemistry      Component Value Date/Time   NA 141 08/16/2014 0921   NA 143 07/05/2014 1010   K 3.5 08/16/2014 0921   K 3.6 07/05/2014 1010   CL 105 08/16/2014 0921   CO2 28 08/16/2014 0921   CO2 29 07/05/2014 1010   BUN 14 08/16/2014 0921   BUN 7.3 07/05/2014 1010   CREATININE 0.72 08/16/2014 0921   CREATININE 0.7 07/05/2014 1010      Component Value Date/Time   CALCIUM 10.5 08/16/2014 0921   CALCIUM 10.6* 07/05/2014 1010   ALKPHOS 86 08/16/2014 0921   ALKPHOS 79 07/05/2014 1010   AST 24 08/16/2014 0921   AST 18 07/05/2014 1010   ALT 20 08/16/2014 0921   ALT 12 07/05/2014 1010   BILITOT 0.2* 08/16/2014 0921   BILITOT 0.35 07/05/2014 1010       Lab Results  Component Value Date   WBC 7.3 08/16/2014  HGB 11.0* 08/16/2014   HCT 34.8 08/16/2014   MCV 90.2 08/16/2014   PLT 289 08/16/2014   NEUTROABS 4.4 08/16/2014    ASSESSMENT & PLAN:  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 AxLN biopsy: ER 70% PR 0% Ki-67 80% HER-2 +2.92, gene copy #5.5 T2, N1, M0 stage IIB Status post neoadjuvant chemotherapy with TCH Perjeta from 02/09/2014- 05/24/2014 (last cycle was given without chemotherapy) Left breast mastectomy 06/26/2014: Complete pathologic response, 20 lymph nodes negative  Current treatment:  1. Herceptin maintenance every 3 weeks 2. Start anastrozole 08/16/14 1 mg daily  Anastrozole counseling:We discussed the risks and benefits of anti-estrogen therapy with aromatase inhibitors. These include but not limited to insomnia, hot flashes, mood changes, vaginal dryness, bone density loss, and weight gain. Although rare, serious side effects including  risk of blood clots were  also discussed. We strongly believe that the benefits far outweigh the risks. Patient understands these risks and consented to starting treatment. Planned treatment duration is 5 years.  Neuropathy: I encouraged her to take Neurontin for neuropathic pain in the fingers and toes as well as for the left arm. I also described that it will help with hot flashes related to anastrozole.  Herceptin toxicities: 1. Echocardiogram done January 2016 normal Echocardiogram to be done in April 2016 2. Patient experienced leg swelling with Herceptin which resolved with Lasix.  I renewed prescription for Lasix, endocrine, and 7 prescription for anastrozole. Return to clinic every 3 weeks for Herceptin and in 6 weeks for clinic follow-up.     No orders of the defined types were placed in this encounter.   The patient has a good understanding of the overall plan. she agrees with it. She will call with any problems that may develop before her next visit here.   Rulon Eisenmenger, MD

## 2014-08-16 NOTE — Patient Instructions (Signed)
[facility] Discharge Instructions for Patients Receiving Chemotherapy  Today you received the following chemotherapy agents herceptin   To help prevent nausea and vomiting after your treatment, we encourage you to take your nausea medication as directed   If you develop nausea and vomiting that is not controlled by your nausea medication, call the clinic.   BELOW ARE SYMPTOMS THAT SHOULD BE REPORTED IMMEDIATELY:  *FEVER GREATER THAN 100.5 F  *CHILLS WITH OR WITHOUT FEVER  NAUSEA AND VOMITING THAT IS NOT CONTROLLED WITH YOUR NAUSEA MEDICATION  *UNUSUAL SHORTNESS OF BREATH  *UNUSUAL BRUISING OR BLEEDING  TENDERNESS IN MOUTH AND THROAT WITH OR WITHOUT PRESENCE OF ULCERS  *URINARY PROBLEMS  *BOWEL PROBLEMS  UNUSUAL RASH Items with * indicate a potential emergency and should be followed up as soon as possible.  Feel free to call the clinic you have any questions or concerns. The clinic phone number is [phone].  

## 2014-08-16 NOTE — Assessment & Plan Note (Signed)
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 AxLN biopsy: ER 70% PR 0% Ki-67 80% HER-2 +2.92, gene copy #5.5 T2, N1, M0 stage IIB Status post neoadjuvant chemotherapy with TCH Perjeta from 02/09/2014- 05/24/2014 (last cycle was given without chemotherapy) Left breast mastectomy 06/26/2014: Complete pathologic response, 20 lymph nodes negative  Current treatment: Herceptin maintenance every 3 weeks  Herceptin toxicities: 1. Echocardiogram done January 2016 normal Echocardiogram to be done in April 2016 2. Patient experienced leg swelling with Herceptin which resolved with Lasix.  Return to clinic every 3 weeks for Herceptin and in 6 weeks for clinic follow-up.

## 2014-08-17 ENCOUNTER — Telehealth: Payer: Self-pay | Admitting: *Deleted

## 2014-08-17 NOTE — Telephone Encounter (Signed)
ON 08/16/14 PT. RECEIVED HERCEPTIN. HER DISCHARGE SUMMARY READ THE HERCEPTIN IS CHEMOTHERAPY. PT.'S STATES "HER CANCER IS GONE" SO WHY DOES SHE NEED TO TAKE CHEMOTHERAPY. EXPLAINED TO PT. THAT THE HERCEPTIN IS GIVEN TO REDUCE THE CHANCE OF REOCCURRENCE. SHE VOICES UNDERSTANDING.

## 2014-09-03 ENCOUNTER — Telehealth: Payer: Self-pay | Admitting: *Deleted

## 2014-09-03 ENCOUNTER — Other Ambulatory Visit: Payer: Self-pay | Admitting: *Deleted

## 2014-09-03 NOTE — Telephone Encounter (Signed)
Patient called to report that she was started on Prednisone and Zithromax yesterday for laryngitis. Concerned that it might interfere with Herceptin. Patient stated that she is feeling much better today. Advised patient to take medication as prescribed and call if anything changes. She verbalized understanding.

## 2014-09-05 ENCOUNTER — Other Ambulatory Visit: Payer: Self-pay

## 2014-09-05 DIAGNOSIS — C50212 Malignant neoplasm of upper-inner quadrant of left female breast: Secondary | ICD-10-CM

## 2014-09-06 ENCOUNTER — Other Ambulatory Visit (HOSPITAL_BASED_OUTPATIENT_CLINIC_OR_DEPARTMENT_OTHER): Payer: BC Managed Care – PPO

## 2014-09-06 ENCOUNTER — Telehealth: Payer: Self-pay

## 2014-09-06 ENCOUNTER — Other Ambulatory Visit: Payer: Self-pay

## 2014-09-06 ENCOUNTER — Ambulatory Visit (HOSPITAL_BASED_OUTPATIENT_CLINIC_OR_DEPARTMENT_OTHER): Payer: BC Managed Care – PPO

## 2014-09-06 VITALS — BP 154/79 | HR 81 | Temp 97.3°F | Resp 20

## 2014-09-06 DIAGNOSIS — Z5112 Encounter for antineoplastic immunotherapy: Secondary | ICD-10-CM | POA: Diagnosis not present

## 2014-09-06 DIAGNOSIS — C50512 Malignant neoplasm of lower-outer quadrant of left female breast: Secondary | ICD-10-CM | POA: Diagnosis not present

## 2014-09-06 DIAGNOSIS — C50212 Malignant neoplasm of upper-inner quadrant of left female breast: Secondary | ICD-10-CM

## 2014-09-06 DIAGNOSIS — C50412 Malignant neoplasm of upper-outer quadrant of left female breast: Secondary | ICD-10-CM

## 2014-09-06 LAB — CBC WITH DIFFERENTIAL/PLATELET
BASO%: 0.3 % (ref 0.0–2.0)
Basophils Absolute: 0 10*3/uL (ref 0.0–0.1)
EOS%: 0.4 % (ref 0.0–7.0)
Eosinophils Absolute: 0.1 10*3/uL (ref 0.0–0.5)
HEMATOCRIT: 33.6 % — AB (ref 34.8–46.6)
HGB: 10.8 g/dL — ABNORMAL LOW (ref 11.6–15.9)
LYMPH#: 5.8 10*3/uL — AB (ref 0.9–3.3)
LYMPH%: 38.7 % (ref 14.0–49.7)
MCH: 27.9 pg (ref 25.1–34.0)
MCHC: 32.1 g/dL (ref 31.5–36.0)
MCV: 86.8 fL (ref 79.5–101.0)
MONO#: 0.9 10*3/uL (ref 0.1–0.9)
MONO%: 5.9 % (ref 0.0–14.0)
NEUT#: 8.2 10*3/uL — ABNORMAL HIGH (ref 1.5–6.5)
NEUT%: 54.7 % (ref 38.4–76.8)
Platelets: 337 10*3/uL (ref 145–400)
RBC: 3.87 10*6/uL (ref 3.70–5.45)
RDW: 14.2 % (ref 11.2–14.5)
WBC: 14.9 10*3/uL — ABNORMAL HIGH (ref 3.9–10.3)

## 2014-09-06 LAB — COMPREHENSIVE METABOLIC PANEL (CC13)
ALBUMIN: 3.5 g/dL (ref 3.5–5.0)
ALK PHOS: 95 U/L (ref 40–150)
ALT: 25 U/L (ref 0–55)
AST: 16 U/L (ref 5–34)
Anion Gap: 12 mEq/L — ABNORMAL HIGH (ref 3–11)
BUN: 15.6 mg/dL (ref 7.0–26.0)
CO2: 27 meq/L (ref 22–29)
Calcium: 10.7 mg/dL — ABNORMAL HIGH (ref 8.4–10.4)
Chloride: 104 mEq/L (ref 98–109)
Creatinine: 0.7 mg/dL (ref 0.6–1.1)
Glucose: 93 mg/dl (ref 70–140)
POTASSIUM: 3.2 meq/L — AB (ref 3.5–5.1)
SODIUM: 143 meq/L (ref 136–145)
TOTAL PROTEIN: 7.1 g/dL (ref 6.4–8.3)
Total Bilirubin: 0.2 mg/dL (ref 0.20–1.20)

## 2014-09-06 LAB — TECHNOLOGIST REVIEW

## 2014-09-06 MED ORDER — ACETAMINOPHEN 325 MG PO TABS
ORAL_TABLET | ORAL | Status: AC
  Filled 2014-09-06: qty 2

## 2014-09-06 MED ORDER — SODIUM CHLORIDE 0.9 % IJ SOLN
10.0000 mL | INTRAMUSCULAR | Status: DC | PRN
Start: 2014-09-06 — End: 2014-09-06
  Administered 2014-09-06: 10 mL
  Filled 2014-09-06: qty 10

## 2014-09-06 MED ORDER — DIPHENHYDRAMINE HCL 25 MG PO CAPS
ORAL_CAPSULE | ORAL | Status: AC
  Filled 2014-09-06: qty 2

## 2014-09-06 MED ORDER — ACETAMINOPHEN 325 MG PO TABS
650.0000 mg | ORAL_TABLET | Freq: Once | ORAL | Status: AC
  Administered 2014-09-06: 650 mg via ORAL

## 2014-09-06 MED ORDER — SODIUM CHLORIDE 0.9 % IV SOLN
6.0000 mg/kg | Freq: Once | INTRAVENOUS | Status: AC
  Administered 2014-09-06: 546 mg via INTRAVENOUS
  Filled 2014-09-06: qty 26

## 2014-09-06 MED ORDER — SODIUM CHLORIDE 0.9 % IV SOLN
Freq: Once | INTRAVENOUS | Status: AC
  Administered 2014-09-06: 09:00:00 via INTRAVENOUS

## 2014-09-06 MED ORDER — DIPHENHYDRAMINE HCL 25 MG PO CAPS
50.0000 mg | ORAL_CAPSULE | Freq: Once | ORAL | Status: AC
  Administered 2014-09-06: 50 mg via ORAL

## 2014-09-06 MED ORDER — HEPARIN SOD (PORK) LOCK FLUSH 100 UNIT/ML IV SOLN
500.0000 [IU] | Freq: Once | INTRAVENOUS | Status: AC | PRN
  Administered 2014-09-06: 500 [IU]
  Filled 2014-09-06: qty 5

## 2014-09-06 NOTE — Progress Notes (Signed)
Last echo done in 05/2014.  Dr. Lindi Adie notified and OK to treat with Herceptin today per MD.  Echo will be scheduled by T. Rosine Abe.

## 2014-09-06 NOTE — Telephone Encounter (Signed)
LMOVM - pt potassium low - Dr. Lindi Adie would like her to supplement her diet.  Mailed list of potassium rich foods to patient.  Pt to call clinic if she has any questions.

## 2014-09-06 NOTE — Progress Notes (Signed)
OK to treat today per Dr. Lindi Adie.  Echo has been ordered.

## 2014-09-06 NOTE — Patient Instructions (Addendum)
[facility] Discharge Instructions for Patients Receiving Chemotherapy  Today you received the following chemotherapy agent Herceptin.  To help prevent nausea and vomiting after your treatment, we encourage you to take your nausea medication as directed   If you develop nausea and vomiting that is not controlled by your nausea medication, call the clinic.   BELOW ARE SYMPTOMS THAT SHOULD BE REPORTED IMMEDIATELY:  *FEVER GREATER THAN 100.5 F  *CHILLS WITH OR WITHOUT FEVER  NAUSEA AND VOMITING THAT IS NOT CONTROLLED WITH YOUR NAUSEA MEDICATION  *UNUSUAL SHORTNESS OF BREATH  *UNUSUAL BRUISING OR BLEEDING  TENDERNESS IN MOUTH AND THROAT WITH OR WITHOUT PRESENCE OF ULCERS  *URINARY PROBLEMS  *BOWEL PROBLEMS  UNUSUAL RASH Items with * indicate a potential emergency and should be followed up as soon as possible.  Feel free to call the clinic you have any questions or concerns. The clinic phone number is [phone].   Potassium Content of Foods Potassium is a mineral found in many foods and drinks. It helps keep fluids and minerals balanced in your body and affects how steadily your heart beats. Potassium also helps control your blood pressure and keep your muscles and nervous system healthy. Certain health conditions and medicines may change the balance of potassium in your body. When this happens, you can help balance your level of potassium through the foods that you do or do not eat. Your health care provider or dietitian may recommend an amount of potassium that you should have each day. The following lists of foods provide the amount of potassium (in parentheses) per serving in each item. HIGH IN POTASSIUM  The following foods and beverages have 200 mg or more of potassium per serving:  Apricots, 2 raw or 5 dry (200 mg).  Artichoke, 1 medium (345 mg).  Avocado, raw,  each (245 mg).  Banana, 1 medium (425 mg).  Beans, lima, or baked beans, canned,  cup  (280 mg).  Beans, white, canned,  cup (595 mg).  Beef roast, 3 oz (320 mg).  Beef, ground, 3 oz (270 mg).  Beets, raw or cooked,  cup (260 mg).  Bran muffin, 2 oz (300 mg).  Broccoli,  cup (230 mg).  Brussels sprouts,  cup (250 mg).  Cantaloupe,  cup (215 mg).  Cereal, 100% bran,  cup (200-400 mg).  Cheeseburger, single, fast food, 1 each (225-400 mg).  Chicken, 3 oz (220 mg).  Clams, canned, 3 oz (535 mg).  Crab, 3 oz (225 mg).  Dates, 5 each (270 mg).  Dried beans and peas,  cup (300-475 mg).  Figs, dried, 2 each (260 mg).  Fish: halibut, tuna, cod, snapper, 3 oz (480 mg).  Fish: salmon, haddock, swordfish, perch, 3 oz (300 mg).  Fish, tuna, canned 3 oz (200 mg).  [place] fries, fast food, 3 oz (470 mg).  Granola with fruit and nuts,  cup (200 mg).  Grapefruit juice,  cup (200 mg).  Greens, beet,  cup (655 mg).  Honeydew melon,  cup (200 mg).  Kale, raw, 1 cup (300 mg).  Kiwi, 1 medium (240 mg).  Kohlrabi, rutabaga, parsnips,  cup (280 mg).  Lentils,  cup (365 mg).  Mango, 1 each (325 mg).  Milk, chocolate, 1 cup (420 mg).  Milk: nonfat, low-fat, whole, buttermilk, 1 cup (350-380 mg).  Molasses, 1 Tbsp (295 mg).  Mushrooms,  cup (280) mg.  Nectarine, 1 each (275 mg).  Nuts: almonds, peanuts, hazelnuts, [place], cashew, mixed, 1 oz (200 mg).  Nuts,  pistachios, 1 oz (295 mg).  Orange, 1 each (240 mg).  Orange juice,  cup (235 mg).  Papaya, medium,  fruit (390 mg).  Peanut butter, chunky, 2 Tbsp (240 mg).  Peanut butter, smooth, 2 Tbsp (210 mg).  Pear, 1 medium (200 mg).  Pomegranate, 1 whole (400 mg).  Pomegranate juice,  cup (215 mg).  Pork, 3 oz (350 mg).  Potato chips, salted, 1 oz (465 mg).  Potato, baked with skin, 1 medium (925 mg).  Potatoes, boiled,  cup (255 mg).  Potatoes, mashed,  cup (330 mg).  Prune juice,  cup (370 mg).  Prunes, 5 each (305 mg).  Pudding, chocolate,  cup (230  mg).  Pumpkin, canned,  cup (250 mg).  Raisins, seedless,  cup (270 mg).  Seeds, sunflower or pumpkin, 1 oz (240 mg).  Soy milk, 1 cup (300 mg).  Spinach,  cup (420 mg).  Spinach, canned,  cup (370 mg).  Sweet potato, baked with skin, 1 medium (450 mg).  Swiss chard,  cup (480 mg).  Tomato or vegetable juice,  cup (275 mg).  Tomato sauce or puree,  cup (400-550 mg).  Tomato, raw, 1 medium (290 mg).  Tomatoes, canned,  cup (200-300 mg).  [place], 3 oz (250 mg).  Wheat germ, 1 oz (250 mg).  Winter squash,  cup (250 mg).  Yogurt, plain or fruited, 6 oz (260-435 mg).  Zucchini,  cup (220 mg). MODERATE IN POTASSIUM The following foods and beverages have 50-200 mg of potassium per serving:  Apple, 1 each (150 mg).  Apple juice,  cup (150 mg).  Applesauce,  cup (90 mg).  Apricot nectar,  cup (140 mg).  Asparagus, small spears,  cup or 6 spears (155 mg).  Bagel, cinnamon raisin, 1 each (130 mg).  Bagel, egg or plain, 4 in., 1 each (70 mg).  Beans, green,  cup (90 mg).  Beans, yellow,  cup (190 mg).  Beer, regular, 12 oz (100 mg).  Beets, canned,  cup (125 mg).  Blackberries,  cup (115 mg).  Blueberries,  cup (60 mg).  Bread, whole wheat, 1 slice (70 mg).  Broccoli, raw,  cup (145 mg).  Cabbage,  cup (150 mg).  Carrots, cooked or raw,  cup (180 mg).  Cauliflower, raw,  cup (150 mg).  Celery, raw,  cup (155 mg).  Cereal, bran flakes, cup (120-150 mg).  Cheese, cottage,  cup (110 mg).  Cherries, 10 each (150 mg).  Chocolate, 1 oz bar (165 mg).  Coffee, brewed 6 oz (90 mg).  Corn,  cup or 1 ear (195 mg).  Cucumbers,  cup (80 mg).  Egg, large, 1 each (60 mg).  Eggplant,  cup (60 mg).  Endive, raw, cup (80 mg).  English muffin, 1 each (65 mg).  Fish, orange roughy, 3 oz (150 mg).  Frankfurter, beef or pork, 1 each (75 mg).  Fruit cocktail,  cup (115 mg).  Grape juice,  cup (170  mg).  Grapefruit,  fruit (175 mg).  Grapes,  cup (155 mg).  Greens: kale, turnip, collard,  cup (110-150 mg).  Ice cream or frozen yogurt, chocolate,  cup (175 mg).  Ice cream or frozen yogurt, vanilla,  cup (120-150 mg).  Lemons, limes, 1 each (80 mg).  Lettuce, all types, 1 cup (100 mg).  Mixed vegetables,  cup (150 mg).  Mushrooms, raw,  cup (110 mg).  Nuts: walnuts, pecans, or macadamia, 1 oz (125 mg).  Oatmeal,  cup (80 mg).  Okra,  cup (110  mg).  Onions, raw,  cup (120 mg).  Peach, 1 each (185 mg).  Peaches, canned,  cup (120 mg).  Pears, canned,  cup (120 mg).  Peas, green, frozen,  cup (90 mg).  Peppers, green,  cup (130 mg).  Peppers, red,  cup (160 mg).  Pineapple juice,  cup (165 mg).  Pineapple, fresh or canned,  cup (100 mg).  Plums, 1 each (105 mg).  Pudding, vanilla,  cup (150 mg).  Raspberries,  cup (90 mg).  Rhubarb,  cup (115 mg).  Rice, wild,  cup (80 mg).  Shrimp, 3 oz (155 mg).  Spinach, raw, 1 cup (170 mg).  Strawberries,  cup (125 mg).  Summer squash  cup (175-200 mg).  Swiss chard, raw, 1 cup (135 mg).  Tangerines, 1 each (140 mg).  Tea, brewed, 6 oz (65 mg).  Turnips,  cup (140 mg).  Watermelon,  cup (85 mg).  Wine, red, table, 5 oz (180 mg).  Wine, white, table, 5 oz (100 mg). LOW IN POTASSIUM The following foods and beverages have less than 50 mg of potassium per serving.  Bread, white, 1 slice (30 mg).  Carbonated beverages, 12 oz (less than 5 mg).  Cheese, 1 oz (20-30 mg).  Cranberries,  cup (45 mg).  Cranberry juice cocktail,  cup (20 mg).  Fats and oils, 1 Tbsp (less than 5 mg).  Hummus, 1 Tbsp (32 mg).  Nectar: papaya, mango, or pear,  cup (35 mg).  Rice, white or brown,  cup (50 mg).  Spaghetti or macaroni,  cup cooked (30 mg).  Tortilla, flour or corn, 1 each (50 mg).  Waffle, 4 in., 1 each (50 mg).  Water chestnuts,  cup (40 mg). Document Released:  12/02/2004 Document Revised: 04/25/2013 Document Reviewed: 03/17/2013 [facility] Patient Information 2015 [place], [place]. This information is not intended to replace advice given to you by your health care provider. Make sure you discuss any questions you have with your health care provider.

## 2014-09-17 ENCOUNTER — Telehealth: Payer: Self-pay

## 2014-09-17 ENCOUNTER — Telehealth (HOSPITAL_COMMUNITY): Payer: Self-pay | Admitting: Unknown Physician Specialty

## 2014-09-17 ENCOUNTER — Telehealth: Payer: Self-pay | Admitting: Hematology and Oncology

## 2014-09-17 NOTE — Telephone Encounter (Signed)
Patient had called in to reschedule her appointment due to a conflict on 2/00,VLDK

## 2014-09-17 NOTE — Telephone Encounter (Signed)
lvm w/pt that echo scheduler was contacted. Since she is not feeling well to make sure she keeps her appt w/the other MD. Please call back and ask to speak with scheduler to r/s herceptin appt. Call back to triage with any other concerns.

## 2014-09-17 NOTE — Telephone Encounter (Signed)
error 

## 2014-09-17 NOTE — Telephone Encounter (Signed)
Echo needs preauthorization. Prior auth request sent to [facility].  Feet are numb, fingers are numb, since chemo treatment. Mild dull ache left sided on top of mastectomy scar, feels when laying down mostly. She is seeing PCP this week.

## 2014-09-17 NOTE — Telephone Encounter (Signed)
Pt called back at 1032 requesting a call, she had been in the shower. Attempted to call back twice.

## 2014-09-17 NOTE — Telephone Encounter (Signed)
Pt lvm she cannot make appt on 26th d/t her grandson graduation that day. Also she has not had her echo scheduled. Also she has not felt well, has no energy. She has appt with other doctor this week.

## 2014-09-17 NOTE — Telephone Encounter (Signed)
lvm with [facility] scheduler to look into the echo request and schedule.

## 2014-09-25 ENCOUNTER — Other Ambulatory Visit: Payer: Self-pay | Admitting: *Deleted

## 2014-09-25 ENCOUNTER — Telehealth (HOSPITAL_COMMUNITY): Payer: Self-pay | Admitting: Vascular Surgery

## 2014-09-25 NOTE — Telephone Encounter (Signed)
Left pt a message to get a echo @ [facility]

## 2014-09-26 ENCOUNTER — Ambulatory Visit (HOSPITAL_BASED_OUTPATIENT_CLINIC_OR_DEPARTMENT_OTHER): Payer: BC Managed Care – PPO | Admitting: Hematology and Oncology

## 2014-09-26 ENCOUNTER — Telehealth: Payer: Self-pay | Admitting: Hematology and Oncology

## 2014-09-26 ENCOUNTER — Ambulatory Visit (HOSPITAL_BASED_OUTPATIENT_CLINIC_OR_DEPARTMENT_OTHER): Payer: BC Managed Care – PPO

## 2014-09-26 ENCOUNTER — Other Ambulatory Visit (HOSPITAL_BASED_OUTPATIENT_CLINIC_OR_DEPARTMENT_OTHER): Payer: BC Managed Care – PPO

## 2014-09-26 VITALS — BP 140/70 | HR 85 | Temp 98.3°F | Resp 18 | Ht 63.0 in | Wt 196.2 lb

## 2014-09-26 DIAGNOSIS — Z79811 Long term (current) use of aromatase inhibitors: Secondary | ICD-10-CM

## 2014-09-26 DIAGNOSIS — Z5112 Encounter for antineoplastic immunotherapy: Secondary | ICD-10-CM | POA: Diagnosis not present

## 2014-09-26 DIAGNOSIS — C50212 Malignant neoplasm of upper-inner quadrant of left female breast: Secondary | ICD-10-CM

## 2014-09-26 DIAGNOSIS — Z170 Estrogen receptor positive status [ER+]: Secondary | ICD-10-CM

## 2014-09-26 DIAGNOSIS — G629 Polyneuropathy, unspecified: Secondary | ICD-10-CM

## 2014-09-26 LAB — CBC WITH DIFFERENTIAL/PLATELET
BASO%: 0.3 % (ref 0.0–2.0)
Basophils Absolute: 0 10*3/uL (ref 0.0–0.1)
EOS%: 2.4 % (ref 0.0–7.0)
Eosinophils Absolute: 0.2 10*3/uL (ref 0.0–0.5)
HEMATOCRIT: 34.8 % (ref 34.8–46.6)
HEMOGLOBIN: 10.9 g/dL — AB (ref 11.6–15.9)
LYMPH%: 33.2 % (ref 14.0–49.7)
MCH: 27.5 pg (ref 25.1–34.0)
MCHC: 31.3 g/dL — ABNORMAL LOW (ref 31.5–36.0)
MCV: 87.9 fL (ref 79.5–101.0)
MONO#: 0.3 10*3/uL (ref 0.1–0.9)
MONO%: 4.8 % (ref 0.0–14.0)
NEUT#: 4 10*3/uL (ref 1.5–6.5)
NEUT%: 59.3 % (ref 38.4–76.8)
PLATELETS: 253 10*3/uL (ref 145–400)
RBC: 3.96 10*6/uL (ref 3.70–5.45)
RDW: 15.3 % — ABNORMAL HIGH (ref 11.2–14.5)
WBC: 6.7 10*3/uL (ref 3.9–10.3)
lymph#: 2.2 10*3/uL (ref 0.9–3.3)

## 2014-09-26 LAB — COMPREHENSIVE METABOLIC PANEL (CC13)
ALT: 18 U/L (ref 0–55)
AST: 19 U/L (ref 5–34)
Albumin: 3.7 g/dL (ref 3.5–5.0)
Alkaline Phosphatase: 96 U/L (ref 40–150)
Anion Gap: 10 mEq/L (ref 3–11)
BUN: 7.2 mg/dL (ref 7.0–26.0)
CALCIUM: 9.6 mg/dL (ref 8.4–10.4)
CHLORIDE: 104 meq/L (ref 98–109)
CO2: 27 mEq/L (ref 22–29)
Creatinine: 0.7 mg/dL (ref 0.6–1.1)
Glucose: 90 mg/dl (ref 70–140)
POTASSIUM: 3.3 meq/L — AB (ref 3.5–5.1)
SODIUM: 141 meq/L (ref 136–145)
Total Bilirubin: 0.44 mg/dL (ref 0.20–1.20)
Total Protein: 7.3 g/dL (ref 6.4–8.3)

## 2014-09-26 MED ORDER — DIPHENHYDRAMINE HCL 25 MG PO CAPS
50.0000 mg | ORAL_CAPSULE | Freq: Once | ORAL | Status: AC
  Administered 2014-09-26: 50 mg via ORAL

## 2014-09-26 MED ORDER — DIPHENHYDRAMINE HCL 25 MG PO CAPS
ORAL_CAPSULE | ORAL | Status: AC
Start: 2014-09-26 — End: 2014-09-26
  Filled 2014-09-26: qty 2

## 2014-09-26 MED ORDER — SODIUM CHLORIDE 0.9 % IJ SOLN
10.0000 mL | INTRAMUSCULAR | Status: DC | PRN
  Administered 2014-09-26: 10 mL
  Filled 2014-09-26: qty 10

## 2014-09-26 MED ORDER — ACETAMINOPHEN 325 MG PO TABS
650.0000 mg | ORAL_TABLET | Freq: Once | ORAL | Status: AC
  Administered 2014-09-26: 650 mg via ORAL

## 2014-09-26 MED ORDER — HEPARIN SOD (PORK) LOCK FLUSH 100 UNIT/ML IV SOLN
500.0000 [IU] | Freq: Once | INTRAVENOUS | Status: AC | PRN
  Administered 2014-09-26: 500 [IU]
  Filled 2014-09-26: qty 5

## 2014-09-26 MED ORDER — ACETAMINOPHEN 325 MG PO TABS
ORAL_TABLET | ORAL | Status: AC
Start: 2014-09-26 — End: 2014-09-26
  Filled 2014-09-26: qty 2

## 2014-09-26 MED ORDER — SODIUM CHLORIDE 0.9 % IV SOLN
Freq: Once | INTRAVENOUS | Status: AC
  Administered 2014-09-26: 09:00:00 via INTRAVENOUS

## 2014-09-26 MED ORDER — TRASTUZUMAB CHEMO INJECTION 440 MG
6.0000 mg/kg | Freq: Once | INTRAVENOUS | Status: AC
  Administered 2014-09-26: 546 mg via INTRAVENOUS
  Filled 2014-09-26: qty 26

## 2014-09-26 NOTE — Patient Instructions (Signed)

## 2014-09-26 NOTE — Progress Notes (Signed)
Ok to treat today. 

## 2014-09-26 NOTE — Telephone Encounter (Signed)
Appointments made and avs printed for patient °

## 2014-09-26 NOTE — Progress Notes (Signed)
Patient Care Team: Lavera Guise, MD as PCP - General (Internal Medicine) Nicholas Lose, MD as Consulting Physician (Hematology and Oncology) Eppie Gibson, MD as Attending Physician (Radiation Oncology) Alphonsa Overall, MD as Consulting Physician (General Surgery)  DIAGNOSIS: Breast cancer of upper-inner quadrant of left female breast   Staging form: Breast, AJCC 7th Edition     Clinical: Stage IIB (T2, N1, cM0) - Signed by Eppie Gibson, MD on 01/31/2014     Pathologic: cM0 - Unsigned   SUMMARY OF ONCOLOGIC HISTORY:   Breast cancer of upper-inner quadrant of left female breast   12/27/2013 Mammogram Mammogram and ultrasound 1.8 cm mass left breast 3:00; 7 cm from nipple with abnormal 1 left axillary lymph node, 2.8 x 1.8 x 1.7 cm, second adjacent smaller nodule 1.2 x 1 x 0.7 cm   01/10/2014 Initial Diagnosis 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        1 AxLN biopsy: ER 70% PR 0% Ki-67 80% HER-2 +2.92, gene copy #5.5   01/19/2014 Breast MRI Left breast: 17 mm mass, with non-mass enhancement both anterior and posterior to the biopsy clip extends 3.2 cm anterior and 2.8 Sinemet as medial to the mass, 1 cm satellite nodule, 2 abnormal lymph nodes left axilla 3.8 cm, 1.4 cm   02/09/2014 - 05/24/2014 Neo-Adjuvant Chemotherapy Neoadjuvant chemotherapy with Taxotere, carboplatin, Herceptin and Perjeta x6 cycles   06/07/2014 Breast MRI Left breast: Soft tissue signal 2.1 x 3.1 cm, resolution of enhancement in the lateral portion of the breast, interval decrease in size of left axillary lymph nodes, no suspicious lymph nodes remain   06/26/2014 Surgery Pathologic complete response, 0/20 lymph nodes negative    CHIEF COMPLIANT: Neuropathy right lower extremity, left arm lymphedema  INTERVAL HISTORY: CHRISTIE VISCOMI is a 64 year old with above-mentioned history of left breast cancer treated with new adjuvant chemotherapy and had a pathologic complete response. She was started on  anastrozole therapy in April and is here today for discussion I toxicities. She is currently on Herceptin maintenance appears to be tolerating extremely well. She has lymphedema in the left arm for which she is using a sleeve and complains of pain underneath the left arm. 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 She does not take Neurontin as prescribed.  REVIEW OF SYSTEMS:   Constitutional: Denies fevers, chills or abnormal weight loss Eyes: Denies blurriness of vision Ears, nose, mouth, throat, and face: Denies mucositis or sore throat Respiratory: Denies cough, dyspnea or wheezes Cardiovascular: Denies palpitation, chest discomfort or lower extremity swelling Gastrointestinal:  Denies nausea, heartburn or change in bowel habits Skin: Denies abnormal skin rashes Lymphatics: Denies new lymphadenopathy or easy bruising Neurological: Neuropathy in extremities especially in the legs Behavioral/Psych: Mood is stable, no new changes  Breast: Left axillary pain and discomfort from prior surgery All other systems were reviewed with the patient and are negative.  I have reviewed the past medical history, past surgical history, social history and family history with the patient and they are unchanged from previous note.  ALLERGIES:  is allergic to darvocet; latex; enalapril; and penicillins.  MEDICATIONS:  Current Outpatient Prescriptions  Medication Sig Dispense Refill  . acetaminophen (TYLENOL) 500 MG tablet Take 500 mg by mouth every 6 (six) hours as needed for mild pain.    Marland Kitchen anastrozole (ARIMIDEX) 1 MG tablet Take 1 tablet (1 mg total) by mouth daily. [place]  tablet 3  . aspirin 81 MG tablet Take 81 mg by mouth every morning.     . cyclobenzaprine (FLEXERIL) 10 MG tablet     . fluticasone (FLONASE) 50 MCG/ACT nasal spray Place 2 sprays into both nostrils daily as needed for allergies or rhinitis.    . furosemide (LASIX) 20 MG  tablet Take 1 tablet (20 mg total) by mouth daily. 30 tablet 0  . gabapentin (NEURONTIN) 300 MG capsule Take 300 mg by mouth 2 (two) times daily.  1  . lidocaine-prilocaine (EMLA) cream Apply 1 application topically as needed. 30 g 0  . LORazepam (ATIVAN) 0.5 MG tablet Take 1 tablet (0.5 mg total) by mouth every 6 (six) hours as needed (Nausea or vomiting). 30 tablet 0  . NIFEdipine (PROCARDIA-XL/ADALAT-CC/NIFEDICAL-XL) 30 MG 24 hr tablet Take 30 mg by mouth 2 (two) times daily.     . Omega-3 Fatty Acids (OMEGA 3 PO) Take 2 tablets by mouth every morning.    . ranitidine (ZANTAC) 150 MG tablet Take 150 mg by mouth 2 (two) times daily.    Marland Kitchen triamcinolone cream (KENALOG) 0.5 %   0  . triamterene-hydrochlorothiazide (MAXZIDE-25) 37.5-25 MG per tablet Take 0.5 tablets by mouth every morning. As needed.    . zolpidem (AMBIEN) 10 MG tablet Take 10 mg by mouth at bedtime as needed for sleep.     No current facility-administered medications for this visit.    PHYSICAL EXAMINATION: ECOG PERFORMANCE STATUS: 1 - Symptomatic but completely ambulatory  Filed Vitals:   09/26/14 0847  BP: 140/70  Pulse: 85  Temp: 98.3 F (36.8 C)  Resp: 18   Filed Weights   09/26/14 0847  Weight: 196 lb 3.2 oz (88.996 kg)    GENERAL:alert, no distress and comfortable SKIN: skin color, texture, turgor are normal, no rashes or significant lesions EYES: normal, Conjunctiva are pink and non-injected, sclera clear OROPHARYNX:no exudate, no erythema and lips, buccal mucosa, and tongue normal  NECK: supple, thyroid normal size, non-tender, without nodularity LYMPH:  no palpable lymphadenopathy in the cervical, axillary or inguinal LUNGS: clear to auscultation and percussion with normal breathing effort HEART: regular rate & rhythm and no murmurs and no lower extremity edema ABDOMEN:abdomen soft, non-tender and normal bowel sounds Musculoskeletal:no cyanosis of digits and no clubbing  NEURO: alert & oriented x 3  with fluent speech, no focal motor/sensory deficits BREAST:*Left chest wall scar is intact. (exam performed in the presence of a chaperone)  LABORATORY DATA:  I have reviewed the data as listed   Chemistry      Component Value Date/Time   NA 143 09/06/2014 0845   NA 141 08/16/2014 0921   K 3.2* 09/06/2014 0845   K 3.5 08/16/2014 0921   CL 105 08/16/2014 0921   CO2 27 09/06/2014 0845   CO2 28 08/16/2014 0921   BUN 15.6 09/06/2014 0845   BUN 14 08/16/2014 0921   CREATININE 0.7 09/06/2014 0845   CREATININE 0.72 08/16/2014 0921      Component Value Date/Time   CALCIUM 10.7* 09/06/2014 0845   CALCIUM 10.5 08/16/2014 0921   ALKPHOS 95 09/06/2014 0845   ALKPHOS 86 08/16/2014 0921   AST 16 09/06/2014 0845   AST 24 08/16/2014 0921   ALT 25 09/06/2014 0845   ALT 20 08/16/2014 0921   BILITOT 0.20 09/06/2014 0845   BILITOT 0.2* 08/16/2014 0921       Lab Results  Component Value Date   WBC 6.7 09/26/2014   HGB 10.9* 09/26/2014  HCT 34.8 09/26/2014   MCV 87.9 09/26/2014   PLT 253 09/26/2014   NEUTROABS 4.0 09/26/2014    ASSESSMENT & PLAN:  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 AxLN biopsy: ER 70% PR 0% Ki-67 80% HER-2 +2.92, gene copy #5.5 T2, N1, M0 stage IIB Status post neoadjuvant chemotherapy with TCH Perjeta from 02/09/2014- 05/24/2014 (last cycle was given without chemotherapy) Left breast mastectomy 06/26/2014: Complete pathologic response, 20 lymph nodes negative  Current treatment:  1. Herceptin maintenance every 3 weeks until Oct 2016 2. Started anastrozole 08/16/14 1 mg daily  Anastrozole toxicities:  No major side effects anastrozole. Denies any hot flashes or myalgias.  Neuropathy: Continue with Neurontin, I instructed her to take it 1 tablet at bedtime. She does feel fatigued and sleepy after Neurontin. This might help her symptoms which she gets  especially at bedtime in the right lower extremity. Mild normocytic anemia: Related to prior chemotherapy being observed.  Herceptin toxicities: 1. Echocardiogram has been ordered 2. Patient experienced leg swelling with Herceptin which resolved with Lasix.  Return to clinic every 3 weeks for Herceptin and in 6 weeks for clinic follow-up.  No orders of the defined types were placed in this encounter.   The patient has a good understanding of the overall plan. she agrees with it. she will call with any problems that may develop before the next visit here.   Rulon Eisenmenger, MD

## 2014-09-26 NOTE — Telephone Encounter (Signed)
Faxed pt medical  Records to [facility]. [phone]

## 2014-09-26 NOTE — Assessment & Plan Note (Signed)
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 AxLN biopsy: ER 70% PR 0% Ki-67 80% HER-2 +2.92, gene copy #5.5 T2, N1, M0 stage IIB Status post neoadjuvant chemotherapy with TCH Perjeta from 02/09/2014- 05/24/2014 (last cycle was given without chemotherapy) Left breast mastectomy 06/26/2014: Complete pathologic response, 20 lymph nodes negative  Current treatment:  1. Herceptin maintenance every 3 weeks until Oct 2016 2. Started anastrozole 08/16/14 1 mg daily  Anastrozole toxicities:  Neuropathy: Continue with Neurontin  Herceptin toxicities: 1. Echocardiogram has been ordered 2. Patient experienced leg swelling with Herceptin which resolved with Lasix.  Return to clinic every 3 weeks for Herceptin and in 6 weeks for clinic follow-up.

## 2014-09-27 ENCOUNTER — Other Ambulatory Visit: Payer: BC Managed Care – PPO

## 2014-09-27 ENCOUNTER — Other Ambulatory Visit (HOSPITAL_COMMUNITY): Payer: BC Managed Care – PPO

## 2014-09-27 ENCOUNTER — Ambulatory Visit: Payer: BC Managed Care – PPO | Admitting: Hematology and Oncology

## 2014-09-27 ENCOUNTER — Ambulatory Visit: Payer: BC Managed Care – PPO

## 2014-09-28 ENCOUNTER — Other Ambulatory Visit (HOSPITAL_COMMUNITY): Payer: BC Managed Care – PPO

## 2014-10-02 ENCOUNTER — Ambulatory Visit (HOSPITAL_COMMUNITY)
Admission: RE | Admit: 2014-10-02 | Discharge: 2014-10-02 | Disposition: A | Discharge status: Home / Self Care | Payer: BC Managed Care – PPO | Source: Ambulatory Visit | Attending: Hematology and Oncology | Admitting: Hematology and Oncology

## 2014-10-02 DIAGNOSIS — C50212 Malignant neoplasm of upper-inner quadrant of left female breast: Secondary | ICD-10-CM | POA: Diagnosis not present

## 2014-10-02 DIAGNOSIS — E785 Hyperlipidemia, unspecified: Secondary | ICD-10-CM | POA: Diagnosis not present

## 2014-10-02 DIAGNOSIS — C50919 Malignant neoplasm of unspecified site of unspecified female breast: Secondary | ICD-10-CM | POA: Insufficient documentation

## 2014-10-02 DIAGNOSIS — I10 Essential (primary) hypertension: Secondary | ICD-10-CM | POA: Insufficient documentation

## 2014-10-02 DIAGNOSIS — Z09 Encounter for follow-up examination after completed treatment for conditions other than malignant neoplasm: Secondary | ICD-10-CM | POA: Diagnosis not present

## 2014-10-02 NOTE — Progress Notes (Signed)
Echocardiogram 2D Echocardiogram has been performed.  Tresa Res 10/02/2014, 12:31 PM

## 2014-10-03 ENCOUNTER — Telehealth: Payer: Self-pay | Admitting: *Deleted

## 2014-10-03 NOTE — Telephone Encounter (Signed)
Echo results called to patient.

## 2014-10-03 NOTE — Telephone Encounter (Signed)
Patient calls requesting results of her echo test from Dr. Lindi Adie   [phone]

## 2014-11-02 ENCOUNTER — Telehealth: Payer: Self-pay

## 2014-11-02 NOTE — Telephone Encounter (Signed)
Order faxed to [facility].  Sent to scan. 

## 2014-11-06 NOTE — Assessment & Plan Note (Signed)
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 AxLN biopsy: ER 70% PR 0% Ki-67 80% HER-2 +2.92, gene copy #5.5 T2, N1, M0 stage IIB Status post neoadjuvant chemotherapy with TCH Perjeta from 02/09/2014- 05/24/2014 (last cycle was given without chemotherapy) Left breast mastectomy 06/26/2014: Complete pathologic response, 20 lymph nodes negative  Current treatment:  1. Herceptin maintenance every 3 weeks until Oct 2016 2. Started anastrozole 08/16/14 1 mg daily  Anastrozole toxicities:  No major side effects anastrozole. Denies any hot flashes or myalgias.  Neuropathy: Continue with Neurontin, I instructed her to take it 1 tablet at bedtime. She does feel fatigued and sleepy after Neurontin. This might help her symptoms which she gets especially at bedtime in the right lower extremity. Mild normocytic anemia: Related to prior chemotherapy being observed.  Herceptin toxicities: 1. Echocardiogram has been ordered 2. Patient experienced leg swelling with Herceptin which resolved with Lasix.  Return to clinic every 3 weeks for Herceptin and in 6 weeks for clinic follow-up.

## 2014-11-07 ENCOUNTER — Ambulatory Visit (HOSPITAL_BASED_OUTPATIENT_CLINIC_OR_DEPARTMENT_OTHER): Payer: BC Managed Care – PPO | Admitting: Hematology and Oncology

## 2014-11-07 ENCOUNTER — Other Ambulatory Visit (HOSPITAL_BASED_OUTPATIENT_CLINIC_OR_DEPARTMENT_OTHER): Payer: BC Managed Care – PPO

## 2014-11-07 ENCOUNTER — Encounter: Payer: Self-pay | Admitting: Hematology and Oncology

## 2014-11-07 ENCOUNTER — Telehealth: Payer: Self-pay | Admitting: Hematology and Oncology

## 2014-11-07 VITALS — BP 139/67 | HR 77 | Temp 98.1°F | Resp 18 | Ht 63.0 in | Wt 198.0 lb

## 2014-11-07 DIAGNOSIS — G629 Polyneuropathy, unspecified: Secondary | ICD-10-CM

## 2014-11-07 DIAGNOSIS — C50212 Malignant neoplasm of upper-inner quadrant of left female breast: Secondary | ICD-10-CM

## 2014-11-07 DIAGNOSIS — Z170 Estrogen receptor positive status [ER+]: Secondary | ICD-10-CM

## 2014-11-07 DIAGNOSIS — T451X5A Adverse effect of antineoplastic and immunosuppressive drugs, initial encounter: Secondary | ICD-10-CM

## 2014-11-07 DIAGNOSIS — Z9884 Bariatric surgery status: Secondary | ICD-10-CM

## 2014-11-07 DIAGNOSIS — G620 Drug-induced polyneuropathy: Secondary | ICD-10-CM | POA: Insufficient documentation

## 2014-11-07 DIAGNOSIS — Z79811 Long term (current) use of aromatase inhibitors: Secondary | ICD-10-CM

## 2014-11-07 LAB — CBC WITH DIFFERENTIAL/PLATELET
BASO%: 0.9 % (ref 0.0–2.0)
BASOS ABS: 0.1 10*3/uL (ref 0.0–0.1)
EOS ABS: 0.1 10*3/uL (ref 0.0–0.5)
EOS%: 1.5 % (ref 0.0–7.0)
HEMATOCRIT: 35.8 % (ref 34.8–46.6)
HGB: 11.7 g/dL (ref 11.6–15.9)
LYMPH%: 25.6 % (ref 14.0–49.7)
MCH: 27.9 pg (ref 25.1–34.0)
MCHC: 32.8 g/dL (ref 31.5–36.0)
MCV: 85 fL (ref 79.5–101.0)
MONO#: 0.6 10*3/uL (ref 0.1–0.9)
MONO%: 7.7 % (ref 0.0–14.0)
NEUT%: 64.3 % (ref 38.4–76.8)
NEUTROS ABS: 4.7 10*3/uL (ref 1.5–6.5)
PLATELETS: 277 10*3/uL (ref 145–400)
RBC: 4.21 10*6/uL (ref 3.70–5.45)
RDW: 15.1 % — ABNORMAL HIGH (ref 11.2–14.5)
WBC: 7.4 10*3/uL (ref 3.9–10.3)
lymph#: 1.9 10*3/uL (ref 0.9–3.3)

## 2014-11-07 LAB — COMPREHENSIVE METABOLIC PANEL (CC13)
ALBUMIN: 3.8 g/dL (ref 3.5–5.0)
ALT: 18 U/L (ref 0–55)
AST: 19 U/L (ref 5–34)
Alkaline Phosphatase: 110 U/L (ref 40–150)
Anion Gap: 9 mEq/L (ref 3–11)
BUN: 10.3 mg/dL (ref 7.0–26.0)
CALCIUM: 10.6 mg/dL — AB (ref 8.4–10.4)
CHLORIDE: 105 meq/L (ref 98–109)
CO2: 29 meq/L (ref 22–29)
CREATININE: 0.7 mg/dL (ref 0.6–1.1)
EGFR: >90 mL/min/{1.73_m2} (ref >90)
Glucose: 93 mg/dl (ref 70–140)
POTASSIUM: 3.5 meq/L (ref 3.5–5.1)
Sodium: 142 mEq/L (ref 136–145)
Total Bilirubin: 0.29 mg/dL (ref 0.20–1.20)
Total Protein: 7.5 g/dL (ref 6.4–8.3)

## 2014-11-07 MED ORDER — GABAPENTIN 300 MG PO CAPS: 300.0000 mg | ORAL_CAPSULE | Freq: Two times a day (BID) | ORAL | Status: DC

## 2014-11-07 NOTE — Progress Notes (Signed)
Patient Care Team: Fozia M Khan, MD as PCP - General (Internal Medicine) Vinay Gudena, MD as Consulting Physician (Hematology and Oncology) Sarah Squire, MD as Attending Physician (Radiation Oncology) David Newman, MD as Consulting Physician (General Surgery)  DIAGNOSIS: Breast cancer of upper-inner quadrant of left female breast   Staging form: Breast, AJCC 7th Edition     Clinical: Stage IIB (T2, N1, cM0) - Signed by Sarah Squire, MD on 01/31/2014     Pathologic: cM0 - Unsigned   SUMMARY OF ONCOLOGIC HISTORY:   Breast cancer of upper-inner quadrant of left female breast   12/27/2013 Mammogram Mammogram and ultrasound 1.8 cm mass left breast 3:00; 7 cm from nipple with abnormal 1 left axillary lymph node, 2.8 x 1.8 x 1.7 cm, second adjacent smaller nodule 1.2 x 1 x 0.7 cm   01/10/2014 Initial Diagnosis 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        1 AxLN biopsy: ER 70% PR 0% Ki-67 80% HER-2 +2.92, gene copy #5.5   01/19/2014 Breast MRI Left breast: 17 mm mass, with non-mass enhancement both anterior and posterior to the biopsy clip extends 3.2 cm anterior and 2.8 Sinemet as medial to the mass, 1 cm satellite nodule, 2 abnormal lymph nodes left axilla 3.8 cm, 1.4 cm   02/09/2014 - 05/24/2014 Neo-Adjuvant Chemotherapy Neoadjuvant chemotherapy with Taxotere, carboplatin, Herceptin and Perjeta x6 cycles   06/07/2014 Breast MRI Left breast: Soft tissue signal 2.1 x 3.1 cm, resolution of enhancement in the lateral portion of the breast, interval decrease in size of left axillary lymph nodes, no suspicious lymph nodes remain   06/26/2014 Surgery Left mastectomy: Pathologic complete response, 0/20 lymph nodes negative    CHIEF COMPLIANT: Follow-up on Herceptin maintenance and anastrozole  INTERVAL HISTORY: Regina Deleon is a 64-year-old with above-mentioned history of left breast cancer with neo-adjuvant chemotherapy followed by left mastectomy. There was pathologic  complete response. She is currently on Herceptin maintenance as well as adjuvant antiestrogen therapy with anastrozole. She is tolerating both Herceptin and anastrozole extremely well. Unfortunately she has not been getting Herceptin maintenance treatments. She missed last treatment was supposed to be done 3 weeks ago. Her major complaints of neuropathy in her feet which especially bother her at nighttime but she is not being compliant on Neurontin. Also has mild neuropathy in the fingers. Major complaint is left arm swelling from lymphedema. She is using a lymphedema sleeve  REVIEW OF SYSTEMS:   Constitutional: Denies fevers, chills or abnormal weight loss Eyes: Denies blurriness of vision Ears, nose, mouth, throat, and face: Denies mucositis or sore throat Respiratory: Denies cough, dyspnea or wheezes Cardiovascular: Denies palpitation, chest discomfort or lower extremity swelling Gastrointestinal:  Denies nausea, heartburn or change in bowel habits Skin: Denies abnormal skin rashes Lymphatics: Lymphedema left arm Neurological: Grade 2 neuropathy in her feet Behavioral/Psych: Mood is stable, no new changes  Breast: Left arm lymphedema All other systems were reviewed with the patient and are negative.  I have reviewed the past medical history, past surgical history, social history and family history with the patient and they are unchanged from previous note.  ALLERGIES:  is allergic to darvocet; latex; enalapril; and penicillins.  MEDICATIONS:  Current Outpatient Prescriptions  Medication Sig Dispense Refill  . acetaminophen (TYLENOL) 500 MG tablet Take 500 mg by mouth every 6 (six) hours as needed for mild pain.    . anastrozole (ARIMIDEX) 1 MG tablet Take 1 tablet (1   mg total) by mouth daily. 90 tablet 3  . aspirin 81 MG tablet Take 81 mg by mouth every morning.     . atorvastatin (LIPITOR) 10 MG tablet TAKE 1 TABLET BY MOUTH EVERY PM FOR HIGH CHOLESTEROL  3  . cyclobenzaprine  (FLEXERIL) 10 MG tablet     . fluticasone (FLONASE) 50 MCG/ACT nasal spray Place 2 sprays into both nostrils daily as needed for allergies or rhinitis.    . furosemide (LASIX) 20 MG tablet Take 1 tablet (20 mg total) by mouth daily. 30 tablet 0  . gabapentin (NEURONTIN) 300 MG capsule Take 300 mg by mouth 2 (two) times daily.  1  . lidocaine-prilocaine (EMLA) cream Apply 1 application topically as needed. 30 g 0  . LORazepam (ATIVAN) 0.5 MG tablet Take 1 tablet (0.5 mg total) by mouth every 6 (six) hours as needed (Nausea or vomiting). 30 tablet 0  . NIFEdipine (PROCARDIA-XL/ADALAT-CC/NIFEDICAL-XL) 30 MG 24 hr tablet Take 30 mg by mouth 2 (two) times daily.     . Omega-3 Fatty Acids (OMEGA 3 PO) Take 2 tablets by mouth every morning.    . ranitidine (ZANTAC) 150 MG tablet Take 150 mg by mouth 2 (two) times daily.    . triamcinolone cream (KENALOG) 0.5 %   0  . triamterene-hydrochlorothiazide (MAXZIDE-25) 37.5-25 MG per tablet Take 0.5 tablets by mouth every morning. As needed.    . zolpidem (AMBIEN) 10 MG tablet Take 10 mg by mouth at bedtime as needed for sleep.     No current facility-administered medications for this visit.    PHYSICAL EXAMINATION: ECOG PERFORMANCE STATUS: 1 - Symptomatic but completely ambulatory  Filed Vitals:   11/07/14 0806  BP: 139/67  Pulse: 77  Temp: 98.1 F (36.7 C)  Resp: 18   Filed Weights   11/07/14 0806  Weight: 198 lb (89.812 kg)    GENERAL:alert, no distress and comfortable SKIN: skin color, texture, turgor are normal, no rashes or significant lesions EYES: normal, Conjunctiva are pink and non-injected, sclera clear OROPHARYNX:no exudate, no erythema and lips, buccal mucosa, and tongue normal  NECK: supple, thyroid normal size, non-tender, without nodularity LYMPH:  no palpable lymphadenopathy in the cervical, axillary or inguinal LUNGS: clear to auscultation and percussion with normal breathing effort HEART: regular rate & rhythm and no  murmurs and no lower extremity edema ABDOMEN:abdomen soft, non-tender and normal bowel sounds Musculoskeletal:no cyanosis of digits and no clubbing  NEURO: alert & oriented x 3 with fluent speech, no focal motor/sensory deficits  LABORATORY DATA:  I have reviewed the data as listed   Chemistry      Component Value Date/Time   NA 141 09/26/2014 0831   NA 141 08/16/2014 0921   K 3.3* 09/26/2014 0831   K 3.5 08/16/2014 0921   CL 105 08/16/2014 0921   CO2 27 09/26/2014 0831   CO2 28 08/16/2014 0921   BUN 7.2 09/26/2014 0831   BUN 14 08/16/2014 0921   CREATININE 0.7 09/26/2014 0831   CREATININE 0.72 08/16/2014 0921      Component Value Date/Time   CALCIUM 9.6 09/26/2014 0831   CALCIUM 10.5 08/16/2014 0921   ALKPHOS 96 09/26/2014 0831   ALKPHOS 86 08/16/2014 0921   AST 19 09/26/2014 0831   AST 24 08/16/2014 0921   ALT 18 09/26/2014 0831   ALT 20 08/16/2014 0921   BILITOT 0.44 09/26/2014 0831   BILITOT 0.2* 08/16/2014 0921       Lab Results  Component Value Date     WBC 7.4 11/07/2014   HGB 11.7 11/07/2014   HCT 35.8 11/07/2014   MCV 85.0 11/07/2014   PLT 277 11/07/2014   NEUTROABS 4.7 11/07/2014   ASSESSMENT & PLAN:  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 AxLN biopsy: ER 70% PR 0% Ki-67 80% HER-2 +2.92, gene copy #5.5 T2, N1, M0 stage IIB Status post neoadjuvant chemotherapy with TCH Perjeta from 02/09/2014- 05/24/2014 (last cycle was given without chemotherapy) Left breast mastectomy 06/26/2014: Complete pathologic response, 20 lymph nodes negative  Current treatment:  1. Herceptin maintenance every 3 weeks until Oct 2016 2. Started anastrozole 08/16/14 1 mg daily  Anastrozole toxicities:  No major side effects anastrozole. Denies any hot flashes or myalgias.  Neuropathy: Continue with Neurontin at bedtime   Herceptin toxicities: 1. Echocardiogram  10/02/2014: Ejection fraction 55-60% 2. Patient experienced leg swelling with Herceptin which resolved with Lasix. We will need to resume her Herceptin treatments. Because she had a long gap I would like to give her another bolus of Herceptin and continue her maintenance therapy every 3 weeks.  Return to clinic every 3 weeks for Herceptin and in 6 weeks for clinic follow-up.  No orders of the defined types were placed in this encounter.   The patient has a good understanding of the overall plan. she agrees with it. she will call with any problems that may develop before the next visit here.   Gudena, Vinay K, MD      

## 2014-11-07 NOTE — Telephone Encounter (Signed)
Appointments made per pof and patient will get a new schedule on 7/7

## 2014-11-08 ENCOUNTER — Ambulatory Visit (HOSPITAL_BASED_OUTPATIENT_CLINIC_OR_DEPARTMENT_OTHER): Payer: BC Managed Care – PPO

## 2014-11-08 VITALS — BP 123/58 | HR 80 | Temp 98.2°F | Resp 20

## 2014-11-08 DIAGNOSIS — C50512 Malignant neoplasm of lower-outer quadrant of left female breast: Secondary | ICD-10-CM | POA: Diagnosis not present

## 2014-11-08 DIAGNOSIS — C50412 Malignant neoplasm of upper-outer quadrant of left female breast: Secondary | ICD-10-CM

## 2014-11-08 DIAGNOSIS — C50212 Malignant neoplasm of upper-inner quadrant of left female breast: Secondary | ICD-10-CM

## 2014-11-08 DIAGNOSIS — Z5112 Encounter for antineoplastic immunotherapy: Secondary | ICD-10-CM

## 2014-11-08 MED ORDER — SODIUM CHLORIDE 0.9 % IJ SOLN
10.0000 mL | INTRAMUSCULAR | Status: DC | PRN
  Administered 2014-11-08: 10 mL
  Filled 2014-11-08: qty 10

## 2014-11-08 MED ORDER — ACETAMINOPHEN 325 MG PO TABS
ORAL_TABLET | ORAL | Status: AC
  Filled 2014-11-08: qty 2

## 2014-11-08 MED ORDER — SODIUM CHLORIDE 0.9 % IV SOLN
Freq: Once | INTRAVENOUS | Status: AC
  Administered 2014-11-08: 08:00:00 via INTRAVENOUS

## 2014-11-08 MED ORDER — DIPHENHYDRAMINE HCL 25 MG PO CAPS
50.0000 mg | ORAL_CAPSULE | Freq: Once | ORAL | Status: AC
  Administered 2014-11-08: 50 mg via ORAL

## 2014-11-08 MED ORDER — ACETAMINOPHEN 325 MG PO TABS
650.0000 mg | ORAL_TABLET | Freq: Once | ORAL | Status: AC
  Administered 2014-11-08: 650 mg via ORAL

## 2014-11-08 MED ORDER — TRASTUZUMAB CHEMO INJECTION 440 MG
8.0000 mg/kg | Freq: Once | INTRAVENOUS | Status: AC
  Administered 2014-11-08: 735 mg via INTRAVENOUS
  Filled 2014-11-08: qty 35

## 2014-11-08 MED ORDER — HEPARIN SOD (PORK) LOCK FLUSH 100 UNIT/ML IV SOLN
500.0000 [IU] | Freq: Once | INTRAVENOUS | Status: AC | PRN
  Administered 2014-11-08: 500 [IU]
  Filled 2014-11-08: qty 5

## 2014-11-08 MED ORDER — DIPHENHYDRAMINE HCL 25 MG PO CAPS
ORAL_CAPSULE | ORAL | Status: AC
  Filled 2014-11-08: qty 2

## 2014-11-08 NOTE — Patient Instructions (Signed)
[facility] Discharge Instructions for Patients Receiving Chemotherapy  Today you received the following chemotherapy agents HERCEPTIN   To help prevent nausea and vomiting after your treatment, we encourage you to take your nausea medication if needed.   If you develop nausea and vomiting that is not controlled by your nausea medication, call the clinic.   BELOW ARE SYMPTOMS THAT SHOULD BE REPORTED IMMEDIATELY:  *FEVER GREATER THAN 100.5 F  *CHILLS WITH OR WITHOUT FEVER  NAUSEA AND VOMITING THAT IS NOT CONTROLLED WITH YOUR NAUSEA MEDICATION  *UNUSUAL SHORTNESS OF BREATH  *UNUSUAL BRUISING OR BLEEDING  TENDERNESS IN MOUTH AND THROAT WITH OR WITHOUT PRESENCE OF ULCERS  *URINARY PROBLEMS  *BOWEL PROBLEMS  UNUSUAL RASH Items with * indicate a potential emergency and should be followed up as soon as possible.  Feel free to call the clinic you have any questions or concerns. The clinic phone number is [phone].  Please show the [place] at check-in to the Emergency Department and triage nurse.

## 2014-11-29 ENCOUNTER — Ambulatory Visit (HOSPITAL_BASED_OUTPATIENT_CLINIC_OR_DEPARTMENT_OTHER): Payer: BC Managed Care – PPO

## 2014-11-29 ENCOUNTER — Other Ambulatory Visit (HOSPITAL_BASED_OUTPATIENT_CLINIC_OR_DEPARTMENT_OTHER): Payer: BC Managed Care – PPO

## 2014-11-29 VITALS — BP 146/71 | HR 73 | Temp 98.2°F | Resp 18

## 2014-11-29 DIAGNOSIS — C50412 Malignant neoplasm of upper-outer quadrant of left female breast: Secondary | ICD-10-CM

## 2014-11-29 DIAGNOSIS — C50212 Malignant neoplasm of upper-inner quadrant of left female breast: Secondary | ICD-10-CM

## 2014-11-29 DIAGNOSIS — C50512 Malignant neoplasm of lower-outer quadrant of left female breast: Secondary | ICD-10-CM

## 2014-11-29 DIAGNOSIS — Z5112 Encounter for antineoplastic immunotherapy: Secondary | ICD-10-CM

## 2014-11-29 LAB — CBC WITH DIFFERENTIAL/PLATELET
BASO%: 0.9 % (ref 0.0–2.0)
BASOS ABS: 0.1 10*3/uL (ref 0.0–0.1)
EOS%: 2.1 % (ref 0.0–7.0)
Eosinophils Absolute: 0.1 10*3/uL (ref 0.0–0.5)
HCT: 34.6 % — ABNORMAL LOW (ref 34.8–46.6)
HEMOGLOBIN: 11.2 g/dL — AB (ref 11.6–15.9)
LYMPH#: 1.7 10*3/uL (ref 0.9–3.3)
LYMPH%: 25.9 % (ref 14.0–49.7)
MCH: 27.5 pg (ref 25.1–34.0)
MCHC: 32.4 g/dL (ref 31.5–36.0)
MCV: 84.6 fL (ref 79.5–101.0)
MONO#: 0.5 10*3/uL (ref 0.1–0.9)
MONO%: 7.4 % (ref 0.0–14.0)
NEUT#: 4.1 10*3/uL (ref 1.5–6.5)
NEUT%: 63.7 % (ref 38.4–76.8)
PLATELETS: 256 10*3/uL (ref 145–400)
RBC: 4.09 10*6/uL (ref 3.70–5.45)
RDW: 14.5 % (ref 11.2–14.5)
WBC: 6.5 10*3/uL (ref 3.9–10.3)

## 2014-11-29 LAB — COMPREHENSIVE METABOLIC PANEL (CC13)
ALT: 17 U/L (ref 0–55)
AST: 21 U/L (ref 5–34)
Albumin: 3.6 g/dL (ref 3.5–5.0)
Alkaline Phosphatase: 116 U/L (ref 40–150)
Anion Gap: 9 mEq/L (ref 3–11)
BILIRUBIN TOTAL: 0.36 mg/dL (ref 0.20–1.20)
BUN: 11.3 mg/dL (ref 7.0–26.0)
CALCIUM: 10.8 mg/dL — AB (ref 8.4–10.4)
CHLORIDE: 106 meq/L (ref 98–109)
CO2: 29 mEq/L (ref 22–29)
Creatinine: 0.8 mg/dL (ref 0.6–1.1)
GLUCOSE: 96 mg/dL (ref 70–140)
Potassium: 3.4 mEq/L — ABNORMAL LOW (ref 3.5–5.1)
SODIUM: 143 meq/L (ref 136–145)
TOTAL PROTEIN: 7.2 g/dL (ref 6.4–8.3)

## 2014-11-29 MED ORDER — SODIUM CHLORIDE 0.9 % IV SOLN
Freq: Once | INTRAVENOUS | Status: AC
  Administered 2014-11-29: 09:00:00 via INTRAVENOUS

## 2014-11-29 MED ORDER — TRASTUZUMAB CHEMO INJECTION 440 MG
6.0000 mg/kg | Freq: Once | INTRAVENOUS | Status: AC
  Administered 2014-11-29: 546 mg via INTRAVENOUS
  Filled 2014-11-29: qty 26

## 2014-11-29 MED ORDER — SODIUM CHLORIDE 0.9 % IJ SOLN
10.0000 mL | INTRAMUSCULAR | Status: DC | PRN
  Administered 2014-11-29: 10 mL
  Filled 2014-11-29: qty 10

## 2014-11-29 MED ORDER — ACETAMINOPHEN 325 MG PO TABS
650.0000 mg | ORAL_TABLET | Freq: Once | ORAL | Status: AC
  Administered 2014-11-29: 650 mg via ORAL

## 2014-11-29 MED ORDER — HEPARIN SOD (PORK) LOCK FLUSH 100 UNIT/ML IV SOLN
500.0000 [IU] | Freq: Once | INTRAVENOUS | Status: AC | PRN
  Administered 2014-11-29: 500 [IU]
  Filled 2014-11-29: qty 5

## 2014-11-29 MED ORDER — ACETAMINOPHEN 325 MG PO TABS
ORAL_TABLET | ORAL | Status: AC
  Filled 2014-11-29: qty 2

## 2014-11-29 MED ORDER — DIPHENHYDRAMINE HCL 25 MG PO CAPS
ORAL_CAPSULE | ORAL | Status: AC
  Filled 2014-11-29: qty 2

## 2014-11-29 MED ORDER — DIPHENHYDRAMINE HCL 25 MG PO CAPS
50.0000 mg | ORAL_CAPSULE | Freq: Once | ORAL | Status: AC
  Administered 2014-11-29: 50 mg via ORAL

## 2014-11-29 NOTE — Patient Instructions (Signed)
[facility] Discharge Instructions for Patients  Today you received the following: Herceptin   To help prevent nausea and vomiting after your treatment, we encourage you to take your nausea medication as directed.   If you develop nausea and vomiting that is not controlled by your nausea medication, call the clinic.   BELOW ARE SYMPTOMS THAT SHOULD BE REPORTED IMMEDIATELY:  *FEVER GREATER THAN 100.5 F  *CHILLS WITH OR WITHOUT FEVER  NAUSEA AND VOMITING THAT IS NOT CONTROLLED WITH YOUR NAUSEA MEDICATION  *UNUSUAL SHORTNESS OF BREATH  *UNUSUAL BRUISING OR BLEEDING  TENDERNESS IN MOUTH AND THROAT WITH OR WITHOUT PRESENCE OF ULCERS  *URINARY PROBLEMS  *BOWEL PROBLEMS  UNUSUAL RASH Items with * indicate a potential emergency and should be followed up as soon as possible.  Feel free to call the clinic you have any questions or concerns. The clinic phone number is [phone].  Please show the CHEMO ALERT CARD at check-in to the Emergency Department and triage nurse.   

## 2014-12-20 ENCOUNTER — Telehealth: Payer: Self-pay | Admitting: Nurse Practitioner

## 2014-12-20 ENCOUNTER — Other Ambulatory Visit (HOSPITAL_BASED_OUTPATIENT_CLINIC_OR_DEPARTMENT_OTHER): Payer: BC Managed Care – PPO

## 2014-12-20 ENCOUNTER — Ambulatory Visit (HOSPITAL_BASED_OUTPATIENT_CLINIC_OR_DEPARTMENT_OTHER): Payer: BC Managed Care – PPO | Admitting: Nurse Practitioner

## 2014-12-20 ENCOUNTER — Ambulatory Visit (HOSPITAL_BASED_OUTPATIENT_CLINIC_OR_DEPARTMENT_OTHER): Payer: BC Managed Care – PPO

## 2014-12-20 ENCOUNTER — Encounter: Payer: Self-pay | Admitting: Nurse Practitioner

## 2014-12-20 VITALS — BP 146/74 | HR 84 | Temp 97.8°F | Resp 18 | Ht 63.0 in | Wt 201.2 lb

## 2014-12-20 DIAGNOSIS — C50212 Malignant neoplasm of upper-inner quadrant of left female breast: Secondary | ICD-10-CM

## 2014-12-20 DIAGNOSIS — C50512 Malignant neoplasm of lower-outer quadrant of left female breast: Secondary | ICD-10-CM

## 2014-12-20 DIAGNOSIS — C50412 Malignant neoplasm of upper-outer quadrant of left female breast: Secondary | ICD-10-CM

## 2014-12-20 DIAGNOSIS — G629 Polyneuropathy, unspecified: Secondary | ICD-10-CM | POA: Diagnosis not present

## 2014-12-20 DIAGNOSIS — Z5112 Encounter for antineoplastic immunotherapy: Secondary | ICD-10-CM | POA: Diagnosis not present

## 2014-12-20 LAB — CBC WITH DIFFERENTIAL/PLATELET
BASO%: 0.7 % (ref 0.0–2.0)
Basophils Absolute: 0 10*3/uL (ref 0.0–0.1)
EOS ABS: 0.1 10*3/uL (ref 0.0–0.5)
EOS%: 1.8 % (ref 0.0–7.0)
HCT: 35 % (ref 34.8–46.6)
HGB: 11.4 g/dL — ABNORMAL LOW (ref 11.6–15.9)
LYMPH%: 24.2 % (ref 14.0–49.7)
MCH: 27.7 pg (ref 25.1–34.0)
MCHC: 32.6 g/dL (ref 31.5–36.0)
MCV: 84.9 fL (ref 79.5–101.0)
MONO#: 0.5 10*3/uL (ref 0.1–0.9)
MONO%: 6.9 % (ref 0.0–14.0)
NEUT#: 4.9 10*3/uL (ref 1.5–6.5)
NEUT%: 66.4 % (ref 38.4–76.8)
PLATELETS: 278 10*3/uL (ref 145–400)
RBC: 4.13 10*6/uL (ref 3.70–5.45)
RDW: 14.2 % (ref 11.2–14.5)
WBC: 7.3 10*3/uL (ref 3.9–10.3)
lymph#: 1.8 10*3/uL (ref 0.9–3.3)

## 2014-12-20 LAB — COMPREHENSIVE METABOLIC PANEL (CC13)
ALT: 20 U/L (ref 0–55)
ANION GAP: 10 meq/L (ref 3–11)
AST: 18 U/L (ref 5–34)
Albumin: 3.7 g/dL (ref 3.5–5.0)
Alkaline Phosphatase: 118 U/L (ref 40–150)
BUN: 11.1 mg/dL (ref 7.0–26.0)
CHLORIDE: 105 meq/L (ref 98–109)
CO2: 26 meq/L (ref 22–29)
Calcium: 10.7 mg/dL — ABNORMAL HIGH (ref 8.4–10.4)
Creatinine: 0.8 mg/dL (ref 0.6–1.1)
Glucose: 141 mg/dl — ABNORMAL HIGH (ref 70–140)
Potassium: 3.5 mEq/L (ref 3.5–5.1)
Sodium: 141 mEq/L (ref 136–145)
Total Bilirubin: 0.27 mg/dL (ref 0.20–1.20)
Total Protein: 7.5 g/dL (ref 6.4–8.3)

## 2014-12-20 MED ORDER — DIPHENHYDRAMINE HCL 25 MG PO CAPS
ORAL_CAPSULE | ORAL | Status: AC
  Filled 2014-12-20: qty 2

## 2014-12-20 MED ORDER — SODIUM CHLORIDE 0.9 % IJ SOLN
10.0000 mL | INTRAMUSCULAR | Status: DC | PRN
  Administered 2014-12-20: 10 mL
  Filled 2014-12-20: qty 10

## 2014-12-20 MED ORDER — SODIUM CHLORIDE 0.9 % IV SOLN
Freq: Once | INTRAVENOUS | Status: AC
  Administered 2014-12-20: 12:00:00 via INTRAVENOUS

## 2014-12-20 MED ORDER — SODIUM CHLORIDE 0.9 % IV SOLN
6.0000 mg/kg | Freq: Once | INTRAVENOUS | Status: AC
  Administered 2014-12-20: 546 mg via INTRAVENOUS
  Filled 2014-12-20: qty 26

## 2014-12-20 MED ORDER — ACETAMINOPHEN 325 MG PO TABS
ORAL_TABLET | ORAL | Status: AC
  Filled 2014-12-20: qty 2

## 2014-12-20 MED ORDER — DIPHENHYDRAMINE HCL 25 MG PO CAPS
50.0000 mg | ORAL_CAPSULE | Freq: Once | ORAL | Status: AC
  Administered 2014-12-20: 50 mg via ORAL

## 2014-12-20 MED ORDER — ACETAMINOPHEN 325 MG PO TABS
650.0000 mg | ORAL_TABLET | Freq: Once | ORAL | Status: AC
  Administered 2014-12-20: 650 mg via ORAL

## 2014-12-20 MED ORDER — HEPARIN SOD (PORK) LOCK FLUSH 100 UNIT/ML IV SOLN
500.0000 [IU] | Freq: Once | INTRAVENOUS | Status: AC | PRN
  Administered 2014-12-20: 500 [IU]
  Filled 2014-12-20: qty 5

## 2014-12-20 NOTE — Progress Notes (Signed)
Patient Care Team: Lavera Guise, MD as PCP - General (Internal Medicine) Nicholas Lose, MD as Consulting Physician (Hematology and Oncology) Eppie Gibson, MD as Attending Physician (Radiation Oncology) Alphonsa Overall, MD as Consulting Physician (General Surgery)  DIAGNOSIS: Breast cancer of upper-inner quadrant of left female breast   Staging form: Breast, AJCC 7th Edition     Clinical: Stage IIB (T2, N1, cM0) - Signed by Eppie Gibson, MD on 01/31/2014     Pathologic: cM0 - Unsigned   SUMMARY OF ONCOLOGIC HISTORY:   Breast cancer of upper-inner quadrant of left female breast   12/27/2013 Mammogram Mammogram and ultrasound 1.8 cm mass left breast 3:00; 7 cm from nipple with abnormal 1 left axillary lymph node, 2.8 x 1.8 x 1.7 cm, second adjacent smaller nodule 1.2 x 1 x 0.7 cm   01/10/2014 Initial Diagnosis 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        1 AxLN biopsy: ER 70% PR 0% Ki-67 80% HER-2 +2.92, gene copy #5.5   01/19/2014 Breast MRI Left breast: 17 mm mass, with non-mass enhancement both anterior and posterior to the biopsy clip extends 3.2 cm anterior and 2.8 Sinemet as medial to the mass, 1 cm satellite nodule, 2 abnormal lymph nodes left axilla 3.8 cm, 1.4 cm   02/09/2014 - 05/24/2014 Neo-Adjuvant Chemotherapy Neoadjuvant chemotherapy with Taxotere, carboplatin, Herceptin and Perjeta x6 cycles   06/07/2014 Breast MRI Left breast: Soft tissue signal 2.1 x 3.1 cm, resolution of enhancement in the lateral portion of the breast, interval decrease in size of left axillary lymph nodes, no suspicious lymph nodes remain   06/26/2014 Surgery Left mastectomy: Pathologic complete response, 0/20 lymph nodes negative    CHIEF COMPLIANT: Follow-up on Herceptin maintenance and anastrozole  INTERVAL HISTORY: Regina Deleon is a 64 year old with above-mentioned history of left breast cancer with neo-adjuvant chemotherapy followed by left mastectomy. There was pathologic  complete response. She is currently on Herceptin maintenance as well as adjuvant antiestrogen therapy with anastrozole. She continues to tolerate both drugs well with no side effects that she is aware of. Her main complaint continues to be residual peripheral neuropathy. Use is using the neurontin prescribed to her and this has made a significant difference in her fingers, but still experiences some pain to her feet, especially at night. The neurontin is also helpful for her left arm lymphedema. She wears a compression sleeve for this, but has noticed her swelling is somewhat worse this summer. She is also having a left breast prosthesis made.   REVIEW OF SYSTEMS:   Constitutional: Denies fevers, chills or abnormal weight loss Eyes: Denies blurriness of vision Ears, nose, mouth, throat, and face: Denies mucositis or sore throat Respiratory: Denies cough, dyspnea or wheezes Cardiovascular: Denies palpitation, chest discomfort or lower extremity swelling Gastrointestinal:  Denies nausea, heartburn or change in bowel habits Skin: Denies abnormal skin rashes Lymphatics: Lymphedema left arm Neurological: Grade 2 neuropathy in her feet Behavioral/Psych: Mood is stable, no new changes  Breast: Left arm lymphedema All other systems were reviewed with the patient and are negative.  I have reviewed the past medical history, past surgical history, social history and family history with the patient and they are unchanged from previous note.  ALLERGIES:  is allergic to darvocet; latex; enalapril; and penicillins.  MEDICATIONS:  Current Outpatient Prescriptions  Medication Sig Dispense Refill  . anastrozole (ARIMIDEX) 1 MG tablet Take 1 tablet (1 mg total) by mouth daily.  90 tablet 3  . aspirin 81 MG tablet Take 81 mg by mouth every morning.     Marland Kitchen atorvastatin (LIPITOR) 10 MG tablet TAKE 1 TABLET BY MOUTH EVERY PM FOR HIGH CHOLESTEROL  3  . gabapentin (NEURONTIN) 300 MG capsule Take 1 capsule (300 mg  total) by mouth 2 (two) times daily. 60 capsule 1  . lidocaine-prilocaine (EMLA) cream Apply 1 application topically as needed. 30 g 0  . NIFEdipine (PROCARDIA-XL/ADALAT-CC/NIFEDICAL-XL) 30 MG 24 hr tablet Take 30 mg by mouth 2 (two) times daily.     . Omega-3 Fatty Acids (OMEGA 3 PO) Take 2 tablets by mouth every morning.    . ranitidine (ZANTAC) 150 MG tablet Take 150 mg by mouth 2 (two) times daily.    Marland Kitchen triamterene-hydrochlorothiazide (MAXZIDE-25) 37.5-25 MG per tablet Take 0.5 tablets by mouth every morning. As needed.    . zolpidem (AMBIEN) 10 MG tablet Take 10 mg by mouth at bedtime as needed for sleep.    Marland Kitchen acetaminophen (TYLENOL) 500 MG tablet Take 500 mg by mouth every 6 (six) hours as needed for mild pain.    . cyclobenzaprine (FLEXERIL) 10 MG tablet     . fluticasone (FLONASE) 50 MCG/ACT nasal spray Place 2 sprays into both nostrils daily as needed for allergies or rhinitis.    Marland Kitchen LORazepam (ATIVAN) 0.5 MG tablet Take 1 tablet (0.5 mg total) by mouth every 6 (six) hours as needed (Nausea or vomiting). (Patient not taking: Reported on 12/20/2014) 30 tablet 0   No current facility-administered medications for this visit.    PHYSICAL EXAMINATION: ECOG PERFORMANCE STATUS: 1 - Symptomatic but completely ambulatory  Filed Vitals:   12/20/14 1058  BP: 146/74  Pulse: 84  Temp: 97.8 F (36.6 C)  Resp: 18   Filed Weights   12/20/14 1058  Weight: 201 lb 3.2 oz (91.264 kg)    GENERAL:alert, no distress and comfortable SKIN: skin color, texture, turgor are normal, no rashes or significant lesions EYES: normal, Conjunctiva are pink and non-injected, sclera clear OROPHARYNX:no exudate, no erythema and lips, buccal mucosa, and tongue normal  NECK: supple, thyroid normal size, non-tender, without nodularity LYMPH:  no palpable lymphadenopathy in the cervical, axillary or inguinal LUNGS: clear to auscultation and percussion with normal breathing effort HEART: regular rate & rhythm and  no murmurs and no lower extremity edema ABDOMEN:abdomen soft, non-tender and normal bowel sounds Musculoskeletal:no cyanosis of digits and no clubbing  NEURO: alert & oriented x 3 with fluent speech, no focal motor/sensory deficits  LABORATORY DATA:  I have reviewed the data as listed   Chemistry      Component Value Date/Time   NA 141 12/20/2014 1031   NA 141 08/16/2014 0921   K 3.5 12/20/2014 1031   K 3.5 08/16/2014 0921   CL 105 08/16/2014 0921   CO2 26 12/20/2014 1031   CO2 28 08/16/2014 0921   BUN 11.1 12/20/2014 1031   BUN 14 08/16/2014 0921   CREATININE 0.8 12/20/2014 1031   CREATININE 0.72 08/16/2014 0921      Component Value Date/Time   CALCIUM 10.7* 12/20/2014 1031   CALCIUM 10.5 08/16/2014 0921   ALKPHOS 118 12/20/2014 1031   ALKPHOS 86 08/16/2014 0921   AST 18 12/20/2014 1031   AST 24 08/16/2014 0921   ALT 20 12/20/2014 1031   ALT 20 08/16/2014 0921   BILITOT 0.27 12/20/2014 1031   BILITOT 0.2* 08/16/2014 0921       Lab Results  Component Value Date  WBC 7.3 12/20/2014   HGB 11.4* 12/20/2014   HCT 35.0 12/20/2014   MCV 84.9 12/20/2014   PLT 278 12/20/2014   NEUTROABS 4.9 12/20/2014   ASSESSMENT & PLAN:  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 AxLN biopsy: ER 70% PR 0% Ki-67 80% HER-2 +2.92, gene copy #5.5 T2, N1, M0 stage IIB Status post neoadjuvant chemotherapy with TCH Perjeta from 02/09/2014- 05/24/2014 (last cycle was given without chemotherapy) Left breast mastectomy 06/26/2014: Complete pathologic response, 20 lymph nodes negative  Current treatment:  1. Herceptin maintenance every 3 weeks until Oct 2016 2. Started anastrozole 08/16/14 1 mg daily  Anastrozole toxicities:  No major side effects anastrozole. Denies any hot flashes or myalgias.  Neuropathy: Continue with Neurontin at bedtime   Herceptin toxicities: 1.  Echocardiogram 10/02/2014: Ejection fraction 55-60% 2. Patient experienced leg swelling with Herceptin which resolved with Lasix.  Patient is performing well today. She will be due for an echocardiogram at the end of this month, and a repeat mammogram next month. Both orders were placed today.  Patient will eturn to clinic every 3 weeks for Herceptin and in 6 weeks for clinic follow-up. Target end date for maintenance herceptin will be 02/21/15.  Orders Placed This Encounter  Procedures  . MM DIAG BREAST TOMO UNI RIGHT    Standing Status: Future     Number of Occurrences:      Standing Expiration Date: 02/19/2016    Order Specific Question:  Reason for Exam (SYMPTOM  OR DIAGNOSIS REQUIRED)    Answer:  hx left mastectomy    Order Specific Question:  Preferred imaging location?    Answer:  [facility]  . ECHOCARDIOGRAM COMPLETE    Standing Status: Future     Number of Occurrences:      Standing Expiration Date: 03/21/2016    Scheduling Instructions:     trastuzumab every 3 weeks.    Order Specific Question:  Where should this test be performed    Answer:  [facility]    Order Specific Question:  Complete or Limited study?    Answer:  Complete    Order Specific Question:  With Image Enhancing Agent or without Image Enhancing Agent?    Answer:  With Image Enhancing Agent    Order Specific Question:  Reason for exam-Echo    Answer:  Other - See Comments Section   The patient has a good understanding of the overall plan. she agrees with it. she will call with any problems that may develop before the next visit here.   Laurie Panda, NP

## 2014-12-20 NOTE — Telephone Encounter (Signed)
Gave avs & calendar for September. Sent for echo to be authorized

## 2014-12-20 NOTE — Patient Instructions (Signed)
[facility] Discharge Instructions for Patients Receiving Chemotherapy  Today you received the following chemotherapy agents: Herceptin   To help prevent nausea and vomiting after your treatment, we encourage you to take your nausea medication as directed.    If you develop nausea and vomiting that is not controlled by your nausea medication, call the clinic.   BELOW ARE SYMPTOMS THAT SHOULD BE REPORTED IMMEDIATELY:  *FEVER GREATER THAN 100.5 F  *CHILLS WITH OR WITHOUT FEVER  NAUSEA AND VOMITING THAT IS NOT CONTROLLED WITH YOUR NAUSEA MEDICATION  *UNUSUAL SHORTNESS OF BREATH  *UNUSUAL BRUISING OR BLEEDING  TENDERNESS IN MOUTH AND THROAT WITH OR WITHOUT PRESENCE OF ULCERS  *URINARY PROBLEMS  *BOWEL PROBLEMS  UNUSUAL RASH Items with * indicate a potential emergency and should be followed up as soon as possible.  Feel free to call the clinic you have any questions or concerns. The clinic phone number is [phone].  Please show the CHEMO ALERT CARD at check-in to the Emergency Department and triage nurse.   

## 2014-12-21 ENCOUNTER — Telehealth: Payer: Self-pay | Admitting: Hematology and Oncology

## 2014-12-21 NOTE — Telephone Encounter (Signed)
Left message to confirm Echo 08/24.

## 2014-12-26 ENCOUNTER — Ambulatory Visit (HOSPITAL_COMMUNITY)
Admission: RE | Admit: 2014-12-26 | Discharge: 2014-12-26 | Disposition: A | Discharge status: Home / Self Care | Payer: BC Managed Care – PPO | Source: Ambulatory Visit | Attending: Nurse Practitioner | Admitting: Nurse Practitioner

## 2014-12-26 DIAGNOSIS — E785 Hyperlipidemia, unspecified: Secondary | ICD-10-CM | POA: Diagnosis not present

## 2014-12-26 DIAGNOSIS — I10 Essential (primary) hypertension: Secondary | ICD-10-CM | POA: Diagnosis not present

## 2014-12-26 DIAGNOSIS — C50212 Malignant neoplasm of upper-inner quadrant of left female breast: Secondary | ICD-10-CM | POA: Diagnosis present

## 2014-12-26 NOTE — Progress Notes (Signed)
  Echocardiogram 2D Echocardiogram has been performed.  Jennette Dubin 12/26/2014, 9:51 AM

## 2014-12-27 ENCOUNTER — Telehealth: Payer: Self-pay | Admitting: *Deleted

## 2014-12-27 DIAGNOSIS — C50212 Malignant neoplasm of upper-inner quadrant of left female breast: Secondary | ICD-10-CM

## 2014-12-27 NOTE — Telephone Encounter (Signed)
"  I had echocardiogram yesterday and would like results.  I also have questions about the anti-estrogen medicine.  Does it increase the cholesterol levels.  Original voicemail routed to collaborative nurse voicemail who handles medications for providers.

## 2014-12-27 NOTE — Telephone Encounter (Signed)
Pt reports recent episode of stiffness - was able to resolve with movement and walking.  Advised pt this is a side effect of the anastrazole.  Pt believes she will be able to manage this side effect without any problems.  Requested cholesterol check with next lab as she has read that anastrazole can increase cholesterol levels.  Provided patient with echo results - she was very pleased.   Lipid profile ordered.

## 2015-01-01 ENCOUNTER — Telehealth: Payer: Self-pay | Admitting: *Deleted

## 2015-01-01 NOTE — Telephone Encounter (Signed)
Patient called to report that she may be having side effects from the arimidex. Reports stiffness, HA's, and a scratchy throat since Sunday. States that the stiffness gets better once she is moving. Spokw with Dr. Lindi Adie, patient advised to stop Arimidex for 1 week and see if symptoms resolve and then restart. Advised to call for worsening symptoms, she verbalized understanding.

## 2015-01-10 ENCOUNTER — Telehealth: Payer: Self-pay

## 2015-01-10 ENCOUNTER — Ambulatory Visit (HOSPITAL_BASED_OUTPATIENT_CLINIC_OR_DEPARTMENT_OTHER): Payer: BC Managed Care – PPO

## 2015-01-10 ENCOUNTER — Other Ambulatory Visit (HOSPITAL_BASED_OUTPATIENT_CLINIC_OR_DEPARTMENT_OTHER): Payer: BC Managed Care – PPO

## 2015-01-10 VITALS — BP 140/73 | HR 84 | Temp 98.2°F | Resp 18

## 2015-01-10 DIAGNOSIS — C50412 Malignant neoplasm of upper-outer quadrant of left female breast: Secondary | ICD-10-CM

## 2015-01-10 DIAGNOSIS — Z5112 Encounter for antineoplastic immunotherapy: Secondary | ICD-10-CM | POA: Diagnosis not present

## 2015-01-10 DIAGNOSIS — C50512 Malignant neoplasm of lower-outer quadrant of left female breast: Secondary | ICD-10-CM | POA: Diagnosis not present

## 2015-01-10 DIAGNOSIS — C50212 Malignant neoplasm of upper-inner quadrant of left female breast: Secondary | ICD-10-CM

## 2015-01-10 LAB — CBC WITH DIFFERENTIAL/PLATELET
BASO%: 0.8 % (ref 0.0–2.0)
BASOS ABS: 0.1 10*3/uL (ref 0.0–0.1)
EOS%: 1.5 % (ref 0.0–7.0)
Eosinophils Absolute: 0.1 10*3/uL (ref 0.0–0.5)
HEMATOCRIT: 35.7 % (ref 34.8–46.6)
HEMOGLOBIN: 11.5 g/dL — AB (ref 11.6–15.9)
LYMPH#: 2.1 10*3/uL (ref 0.9–3.3)
LYMPH%: 29.2 % (ref 14.0–49.7)
MCH: 27.4 pg (ref 25.1–34.0)
MCHC: 32.1 g/dL (ref 31.5–36.0)
MCV: 85.3 fL (ref 79.5–101.0)
MONO#: 0.6 10*3/uL (ref 0.1–0.9)
MONO%: 8.3 % (ref 0.0–14.0)
NEUT%: 60.2 % (ref 38.4–76.8)
NEUTROS ABS: 4.3 10*3/uL (ref 1.5–6.5)
Platelets: 274 10*3/uL (ref 145–400)
RBC: 4.19 10*6/uL (ref 3.70–5.45)
RDW: 14.6 % — AB (ref 11.2–14.5)
WBC: 7.1 10*3/uL (ref 3.9–10.3)

## 2015-01-10 LAB — COMPREHENSIVE METABOLIC PANEL (CC13)
ALBUMIN: 3.8 g/dL (ref 3.5–5.0)
ALK PHOS: 113 U/L (ref 40–150)
ALT: 23 U/L (ref 0–55)
ANION GAP: 8 meq/L (ref 3–11)
AST: 19 U/L (ref 5–34)
BUN: 13.8 mg/dL (ref 7.0–26.0)
CALCIUM: 10.8 mg/dL — AB (ref 8.4–10.4)
CO2: 30 mEq/L — ABNORMAL HIGH (ref 22–29)
CREATININE: 0.8 mg/dL (ref 0.6–1.1)
Chloride: 104 mEq/L (ref 98–109)
EGFR: >90 mL/min/{1.73_m2} (ref >90)
Glucose: 91 mg/dl (ref 70–140)
Potassium: 3.7 mEq/L (ref 3.5–5.1)
Sodium: 142 mEq/L (ref 136–145)
Total Bilirubin: 0.49 mg/dL (ref 0.20–1.20)
Total Protein: 7.5 g/dL (ref 6.4–8.3)

## 2015-01-10 LAB — LIPID PANEL
CHOL/HDL RATIO: 4.2 ratio (ref <=5.0)
CHOLESTEROL: 290 mg/dL — AB (ref 125–200)
HDL: 69 mg/dL (ref >=46)
LDL Cholesterol: 190 mg/dL — ABNORMAL HIGH (ref <130)
TRIGLYCERIDES: 153 mg/dL — AB (ref <150)
VLDL: 31 mg/dL — AB (ref <30)

## 2015-01-10 MED ORDER — SODIUM CHLORIDE 0.9 % IJ SOLN
10.0000 mL | INTRAMUSCULAR | Status: DC | PRN
  Administered 2015-01-10: 10 mL
  Filled 2015-01-10: qty 10

## 2015-01-10 MED ORDER — ACETAMINOPHEN 325 MG PO TABS
650.0000 mg | ORAL_TABLET | Freq: Once | ORAL | Status: AC
  Administered 2015-01-10: 650 mg via ORAL

## 2015-01-10 MED ORDER — ACETAMINOPHEN 325 MG PO TABS
ORAL_TABLET | ORAL | Status: AC
  Filled 2015-01-10: qty 2

## 2015-01-10 MED ORDER — HEPARIN SOD (PORK) LOCK FLUSH 100 UNIT/ML IV SOLN
500.0000 [IU] | Freq: Once | INTRAVENOUS | Status: AC | PRN
  Administered 2015-01-10: 500 [IU]
  Filled 2015-01-10: qty 5

## 2015-01-10 MED ORDER — DIPHENHYDRAMINE HCL 25 MG PO CAPS
ORAL_CAPSULE | ORAL | Status: AC
  Filled 2015-01-10: qty 2

## 2015-01-10 MED ORDER — DIPHENHYDRAMINE HCL 25 MG PO CAPS
50.0000 mg | ORAL_CAPSULE | Freq: Once | ORAL | Status: AC
  Administered 2015-01-10: 50 mg via ORAL

## 2015-01-10 MED ORDER — TRASTUZUMAB CHEMO INJECTION 440 MG
6.0000 mg/kg | Freq: Once | INTRAVENOUS | Status: AC
  Administered 2015-01-10: 546 mg via INTRAVENOUS
  Filled 2015-01-10: qty 26

## 2015-01-10 MED ORDER — SODIUM CHLORIDE 0.9 % IV SOLN
Freq: Once | INTRAVENOUS | Status: AC
  Administered 2015-01-10: 10:00:00 via INTRAVENOUS

## 2015-01-10 NOTE — Progress Notes (Signed)
Patient complained of generalized not feel well, nausea, and joint aches. Per instruction patient has not taken Arimidex this week and states she feels better. Patient wants to know how to proceed with the medication from here. Message left with Zigmund Daniel, RN at Dr. Landis Gandy desk. Patient instructed to restart per telephone note.

## 2015-01-10 NOTE — Telephone Encounter (Signed)
Barnetta Chapel, RN called from infusion regarding patient's anastrozole.  Per Beverlee Nims, RN's note-  Dr. Lindi Adie advised to stop Arimidex for 1 week and see if symptoms resolve and then restart. Patient's symptoms were resolved.  Patient encouraged to restart arimidex and call with any issues.

## 2015-01-10 NOTE — Patient Instructions (Signed)
[facility] Discharge Instructions for Patients Receiving Chemotherapy  Today you received the following chemotherapy agents:  Herceptin  To help prevent nausea and vomiting after your treatment, we encourage you to take your nausea medication as prescribed.   If you develop nausea and vomiting that is not controlled by your nausea medication, call the clinic.   BELOW ARE SYMPTOMS THAT SHOULD BE REPORTED IMMEDIATELY:  *FEVER GREATER THAN 100.5 F  *CHILLS WITH OR WITHOUT FEVER  NAUSEA AND VOMITING THAT IS NOT CONTROLLED WITH YOUR NAUSEA MEDICATION  *UNUSUAL SHORTNESS OF BREATH  *UNUSUAL BRUISING OR BLEEDING  TENDERNESS IN MOUTH AND THROAT WITH OR WITHOUT PRESENCE OF ULCERS  *URINARY PROBLEMS  *BOWEL PROBLEMS  UNUSUAL RASH Items with * indicate a potential emergency and should be followed up as soon as possible.  Feel free to call the clinic you have any questions or concerns. The clinic phone number is [phone].  Please show the CHEMO ALERT CARD at check-in to the Emergency Department and triage nurse.   

## 2015-01-15 ENCOUNTER — Telehealth: Payer: Self-pay | Admitting: *Deleted

## 2015-01-15 ENCOUNTER — Encounter: Payer: Self-pay | Admitting: Hematology and Oncology

## 2015-01-15 NOTE — Progress Notes (Unsigned)
Spoke with patient ans she voiced understanding concerning the pre-authorization of her echo.  I printed  copies of her completed authorizations.

## 2015-01-15 NOTE — Telephone Encounter (Signed)
PT. HAS RECEIVED A LETTER FROM HER INSURANCE COMPANY STATING HER 12/26/14 ECHO WAS NOT PRIOR AUTHORIZED.

## 2015-01-28 ENCOUNTER — Other Ambulatory Visit: Payer: Self-pay | Admitting: Nurse Practitioner

## 2015-01-28 ENCOUNTER — Ambulatory Visit
Admission: RE | Admit: 2015-01-28 | Discharge: 2015-01-28 | Disposition: A | Discharge status: Home / Self Care | Payer: BC Managed Care – PPO | Source: Ambulatory Visit | Attending: Nurse Practitioner | Admitting: Nurse Practitioner

## 2015-01-28 DIAGNOSIS — C50212 Malignant neoplasm of upper-inner quadrant of left female breast: Secondary | ICD-10-CM

## 2015-01-30 NOTE — Assessment & Plan Note (Signed)
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 AxLN biopsy: ER 70% PR 0% Ki-67 80% HER-2 +2.92, gene copy #5.5 T2, N1, M0 stage IIB  Status post neoadjuvant chemotherapy with TCH Perjeta from 02/09/2014- 05/24/2014 (last cycle was given without chemotherapy)  Left breast mastectomy 06/26/2014: Complete pathologic response, 20 lymph nodes negative  Current treatment:  1. Herceptin maintenance every 3 weeks until Oct 2016  2. Started anastrozole 08/16/14 1 mg daily  Anastrozole toxicities:  No major side effects anastrozole. Denies any hot flashes or myalgias.  Neuropathy: Continue with Neurontin at bedtime  Herceptin toxicities:  1. Echocardiogram 12/26/2014: Ejection fraction 60-65%  2. Patient experienced leg swelling with Herceptin which resolved with Lasix.   Patient will return to clinic every 3 weeks for Herceptin and in 6 weeks for clinic follow-up. Target end date for maintenance herceptin will be 02/21/15.  

## 2015-01-31 ENCOUNTER — Ambulatory Visit (HOSPITAL_BASED_OUTPATIENT_CLINIC_OR_DEPARTMENT_OTHER): Payer: BC Managed Care – PPO | Admitting: Hematology and Oncology

## 2015-01-31 ENCOUNTER — Encounter: Payer: Self-pay | Admitting: Hematology and Oncology

## 2015-01-31 ENCOUNTER — Other Ambulatory Visit (HOSPITAL_BASED_OUTPATIENT_CLINIC_OR_DEPARTMENT_OTHER): Payer: BC Managed Care – PPO

## 2015-01-31 ENCOUNTER — Ambulatory Visit (HOSPITAL_BASED_OUTPATIENT_CLINIC_OR_DEPARTMENT_OTHER): Payer: BC Managed Care – PPO

## 2015-01-31 ENCOUNTER — Telehealth: Payer: Self-pay | Admitting: Hematology and Oncology

## 2015-01-31 VITALS — BP 135/71 | HR 89 | Temp 97.9°F | Resp 18 | Ht 63.0 in | Wt 203.5 lb

## 2015-01-31 DIAGNOSIS — C50212 Malignant neoplasm of upper-inner quadrant of left female breast: Secondary | ICD-10-CM

## 2015-01-31 DIAGNOSIS — C50512 Malignant neoplasm of lower-outer quadrant of left female breast: Secondary | ICD-10-CM

## 2015-01-31 DIAGNOSIS — Z5112 Encounter for antineoplastic immunotherapy: Secondary | ICD-10-CM

## 2015-01-31 DIAGNOSIS — C50412 Malignant neoplasm of upper-outer quadrant of left female breast: Secondary | ICD-10-CM

## 2015-01-31 LAB — CBC WITH DIFFERENTIAL/PLATELET
BASO%: 0.3 % (ref 0.0–2.0)
Basophils Absolute: 0 10*3/uL (ref 0.0–0.1)
EOS ABS: 0 10*3/uL (ref 0.0–0.5)
EOS%: 0.9 % (ref 0.0–7.0)
HCT: 34.7 % — ABNORMAL LOW (ref 34.8–46.6)
HGB: 11.2 g/dL — ABNORMAL LOW (ref 11.6–15.9)
LYMPH%: 25.7 % (ref 14.0–49.7)
MCH: 27.5 pg (ref 25.1–34.0)
MCHC: 32.4 g/dL (ref 31.5–36.0)
MCV: 84.9 fL (ref 79.5–101.0)
MONO#: 0.4 10*3/uL (ref 0.1–0.9)
MONO%: 7.4 % (ref 0.0–14.0)
NEUT#: 3.5 10*3/uL (ref 1.5–6.5)
NEUT%: 65.7 % (ref 38.4–76.8)
PLATELETS: 241 10*3/uL (ref 145–400)
RBC: 4.09 10*6/uL (ref 3.70–5.45)
RDW: 14.7 % — ABNORMAL HIGH (ref 11.2–14.5)
WBC: 5.4 10*3/uL (ref 3.9–10.3)
lymph#: 1.4 10*3/uL (ref 0.9–3.3)

## 2015-01-31 LAB — COMPREHENSIVE METABOLIC PANEL (CC13)
ALT: 23 U/L (ref 0–55)
ANION GAP: 7 meq/L (ref 3–11)
AST: 23 U/L (ref 5–34)
Albumin: 3.2 g/dL — ABNORMAL LOW (ref 3.5–5.0)
Alkaline Phosphatase: 106 U/L (ref 40–150)
BUN: 11.4 mg/dL (ref 7.0–26.0)
CO2: 26 meq/L (ref 22–29)
Calcium: 9.8 mg/dL (ref 8.4–10.4)
Chloride: 107 mEq/L (ref 98–109)
Creatinine: 0.9 mg/dL (ref 0.6–1.1)
EGFR: 84 mL/min/{1.73_m2} — AB (ref >90)
Glucose: 104 mg/dl (ref 70–140)
Potassium: 3.4 mEq/L — ABNORMAL LOW (ref 3.5–5.1)
Sodium: 141 mEq/L (ref 136–145)
Total Protein: 6.6 g/dL (ref 6.4–8.3)

## 2015-01-31 MED ORDER — DIPHENHYDRAMINE HCL 25 MG PO CAPS
50.0000 mg | ORAL_CAPSULE | Freq: Once | ORAL | Status: AC
  Administered 2015-01-31: 50 mg via ORAL

## 2015-01-31 MED ORDER — ACETAMINOPHEN 325 MG PO TABS
ORAL_TABLET | ORAL | Status: AC
  Filled 2015-01-31: qty 2

## 2015-01-31 MED ORDER — SODIUM CHLORIDE 0.9 % IJ SOLN
10.0000 mL | INTRAMUSCULAR | Status: DC | PRN
  Administered 2015-01-31: 10 mL
  Filled 2015-01-31: qty 10

## 2015-01-31 MED ORDER — HEPARIN SOD (PORK) LOCK FLUSH 100 UNIT/ML IV SOLN
500.0000 [IU] | Freq: Once | INTRAVENOUS | Status: AC | PRN
  Administered 2015-01-31: 500 [IU]
  Filled 2015-01-31: qty 5

## 2015-01-31 MED ORDER — TRASTUZUMAB CHEMO INJECTION 440 MG
6.0000 mg/kg | Freq: Once | INTRAVENOUS | Status: AC
  Administered 2015-01-31: 546 mg via INTRAVENOUS
  Filled 2015-01-31: qty 26

## 2015-01-31 MED ORDER — SODIUM CHLORIDE 0.9 % IV SOLN
Freq: Once | INTRAVENOUS | Status: AC
  Administered 2015-01-31: 10:00:00 via INTRAVENOUS

## 2015-01-31 MED ORDER — ACETAMINOPHEN 325 MG PO TABS
650.0000 mg | ORAL_TABLET | Freq: Once | ORAL | Status: AC
  Administered 2015-01-31: 650 mg via ORAL

## 2015-01-31 MED ORDER — DIPHENHYDRAMINE HCL 25 MG PO CAPS
ORAL_CAPSULE | ORAL | Status: AC
  Filled 2015-01-31: qty 2

## 2015-01-31 NOTE — Telephone Encounter (Signed)
Spoke with patient and she is aware of her 6month appointment  °

## 2015-01-31 NOTE — Patient Instructions (Signed)
[facility] Discharge Instructions for Patients Receiving Chemotherapy  Today you received the following chemotherapy agents:  Herceptin  To help prevent nausea and vomiting after your treatment, we encourage you to take your nausea medication as ordered per MD.   If you develop nausea and vomiting that is not controlled by your nausea medication, call the clinic.   BELOW ARE SYMPTOMS THAT SHOULD BE REPORTED IMMEDIATELY:  *FEVER GREATER THAN 100.5 F  *CHILLS WITH OR WITHOUT FEVER  NAUSEA AND VOMITING THAT IS NOT CONTROLLED WITH YOUR NAUSEA MEDICATION  *UNUSUAL SHORTNESS OF BREATH  *UNUSUAL BRUISING OR BLEEDING  TENDERNESS IN MOUTH AND THROAT WITH OR WITHOUT PRESENCE OF ULCERS  *URINARY PROBLEMS  *BOWEL PROBLEMS  UNUSUAL RASH Items with * indicate a potential emergency and should be followed up as soon as possible.  Feel free to call the clinic you have any questions or concerns. The clinic phone number is [phone].  Please show the CHEMO ALERT CARD at check-in to the Emergency Department and triage nurse.   

## 2015-01-31 NOTE — Progress Notes (Signed)
Patient Care Team: Lavera Guise, MD as PCP - General (Internal Medicine) Nicholas Lose, MD as Consulting Physician (Hematology and Oncology) Eppie Gibson, MD as Attending Physician (Radiation Oncology) Alphonsa Overall, MD as Consulting Physician (General Surgery)  DIAGNOSIS: Breast cancer of upper-inner quadrant of left female breast   Staging form: Breast, AJCC 7th Edition     Clinical: Stage IIB (T2, N1, cM0) - Signed by Eppie Gibson, MD on 01/31/2014     Pathologic: cM0 - Unsigned   SUMMARY OF ONCOLOGIC HISTORY:   Breast cancer of upper-inner quadrant of left female breast   12/27/2013 Mammogram Mammogram and ultrasound 1.8 cm mass left breast 3:00; 7 cm from nipple with abnormal 1 left axillary lymph node, 2.8 x 1.8 x 1.7 cm, second adjacent smaller nodule 1.2 x 1 x 0.7 cm   01/10/2014 Initial Diagnosis 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        1 AxLN biopsy: ER 70% PR 0% Ki-67 80% HER-2 +2.92, gene copy #5.5   01/19/2014 Breast MRI Left breast: 17 mm mass, with non-mass enhancement both anterior and posterior to the biopsy clip extends 3.2 cm anterior and 2.8 Sinemet as medial to the mass, 1 cm satellite nodule, 2 abnormal lymph nodes left axilla 3.8 cm, 1.4 cm   02/09/2014 - 05/24/2014 Neo-Adjuvant Chemotherapy Neoadjuvant chemotherapy with Taxotere, carboplatin, Herceptin and Perjeta x6 cycles   06/07/2014 Breast MRI Left breast: Soft tissue signal 2.1 x 3.1 cm, resolution of enhancement in the lateral portion of the breast, interval decrease in size of left axillary lymph nodes, no suspicious lymph nodes remain   06/26/2014 Surgery Left mastectomy: Pathologic complete response, 0/20 lymph nodes negative   08/16/2014 -  Anti-estrogen oral therapy anastrozole 1 mg daily 5 years    CHIEF COMPLIANT: on Herceptin maintenance and anastrozole adjuvant therapy  INTERVAL HISTORY: Regina Deleon is a 6 reported with above-mentioned history of breast cancer  currently on Herceptin maintenance. She is doing very well her recent echocardiogram was normal. She is also tolerating anastrozole fairly well. Neuropathy is continuing to get better.  REVIEW OF SYSTEMS:   Constitutional: Denies fevers, chills or abnormal weight loss Eyes: Denies blurriness of vision Ears, nose, mouth, throat, and face: Denies mucositis or sore throat Respiratory: Denies cough, dyspnea or wheezes Cardiovascular: Denies palpitation, chest discomfort or lower extremity swelling Gastrointestinal:  Denies nausea, heartburn or change in bowel habits Skin: Denies abnormal skin rashes Lymphatics: Denies new lymphadenopathy or easy bruising Neurological:neuropathy is getting better Behavioral/Psych: Mood is stable, no new changes  Breast:  denies any pain or lumps or nodules in either breasts All other systems were reviewed with the patient and are negative.  I have reviewed the past medical history, past surgical history, social history and family history with the patient and they are unchanged from previous note.  ALLERGIES:  is allergic to darvocet; latex; enalapril; and penicillins.  MEDICATIONS:  Current Outpatient Prescriptions  Medication Sig Dispense Refill  . acetaminophen (TYLENOL) 500 MG tablet Take 500 mg by mouth every 6 (six) hours as needed for mild pain.    Marland Kitchen anastrozole (ARIMIDEX) 1 MG tablet Take 1 tablet (1 mg total) by mouth daily. 90 tablet 3  . aspirin 81 MG tablet Take 81 mg by mouth every morning.     Marland Kitchen atorvastatin (LIPITOR) 10 MG tablet TAKE 1 TABLET BY MOUTH EVERY PM FOR HIGH CHOLESTEROL  3  . cyclobenzaprine (FLEXERIL) 10 MG  tablet     . fluticasone (FLONASE) 50 MCG/ACT nasal spray Place 2 sprays into both nostrils daily as needed for allergies or rhinitis.    Marland Kitchen gabapentin (NEURONTIN) 300 MG capsule Take 1 capsule (300 mg total) by mouth 2 (two) times daily. 60 capsule 1  . lidocaine-prilocaine (EMLA) cream Apply 1 application topically as needed.  30 g 0  . LORazepam (ATIVAN) 0.5 MG tablet Take 1 tablet (0.5 mg total) by mouth every 6 (six) hours as needed (Nausea or vomiting). (Patient not taking: Reported on 12/20/2014) 30 tablet 0  . NIFEdipine (PROCARDIA-XL/ADALAT-CC/NIFEDICAL-XL) 30 MG 24 hr tablet Take 30 mg by mouth 2 (two) times daily.     . Omega-3 Fatty Acids (OMEGA 3 PO) Take 2 tablets by mouth every morning.    . ranitidine (ZANTAC) 150 MG tablet Take 150 mg by mouth 2 (two) times daily.    Marland Kitchen triamterene-hydrochlorothiazide (MAXZIDE-25) 37.5-25 MG per tablet Take 0.5 tablets by mouth every morning. As needed.    . zolpidem (AMBIEN) 10 MG tablet Take 10 mg by mouth at bedtime as needed for sleep.     No current facility-administered medications for this visit.    PHYSICAL EXAMINATION: ECOG PERFORMANCE STATUS: 0 - Asymptomatic  There were no vitals filed for this visit. There were no vitals filed for this visit.  GENERAL:alert, no distress and comfortable SKIN: skin color, texture, turgor are normal, no rashes or significant lesions EYES: normal, Conjunctiva are pink and non-injected, sclera clear OROPHARYNX:no exudate, no erythema and lips, buccal mucosa, and tongue normal  NECK: supple, thyroid normal size, non-tender, without nodularity LYMPH:  no palpable lymphadenopathy in the cervical, axillary or inguinal LUNGS: clear to auscultation and percussion with normal breathing effort HEART: regular rate & rhythm and no murmurs and no lower extremity edema ABDOMEN:abdomen soft, non-tender and normal bowel sounds Musculoskeletal:no cyanosis of digits and no clubbing  NEURO: alert & oriented x 3 with fluent speech, no focal motor/sensory deficits BREAST: No palpable masses or nodules in either right or left breasts. No palpable axillary supraclavicular or infraclavicular adenopathy no breast tenderness or nipple discharge. (exam performed in the presence of a chaperone)  LABORATORY DATA:  I have reviewed the data as  listed   Chemistry      Component Value Date/Time   NA 142 01/10/2015 0857   NA 141 08/16/2014 0921   K 3.7 01/10/2015 0857   K 3.5 08/16/2014 0921   CL 105 08/16/2014 0921   CO2 30* 01/10/2015 0857   CO2 28 08/16/2014 0921   BUN 13.8 01/10/2015 0857   BUN 14 08/16/2014 0921   CREATININE 0.8 01/10/2015 0857   CREATININE 0.72 08/16/2014 0921      Component Value Date/Time   CALCIUM 10.8* 01/10/2015 0857   CALCIUM 10.5 08/16/2014 0921   ALKPHOS 113 01/10/2015 0857   ALKPHOS 86 08/16/2014 0921   AST 19 01/10/2015 0857   AST 24 08/16/2014 0921   ALT 23 01/10/2015 0857   ALT 20 08/16/2014 0921   BILITOT 0.49 01/10/2015 0857   BILITOT 0.2* 08/16/2014 0921       Lab Results  Component Value Date   WBC 7.1 01/10/2015   HGB 11.5* 01/10/2015   HCT 35.7 01/10/2015   MCV 85.3 01/10/2015   PLT 274 01/10/2015   NEUTROABS 4.3 01/10/2015   ASSESSMENT & PLAN:  Breast cancer of upper-inner quadrant of left female breast Left breast IDC with DCIS ER 25% PR 4% Ki-67 80% HER-2 positive ratio 2.9  gene copy #5. AxLN biopsy: ER 70% PR 0% Ki-67 80% HER-2 +2.92, gene copy #5.5 T2, N1, M0 stage IIB  Status post neoadjuvant chemotherapy with TCH Perjeta from 02/09/2014- 05/24/2014 (last cycle was given without chemotherapy)  Left breast mastectomy 06/26/2014: Complete pathologic response, 20 lymph nodes negative   Current treatment:  1. Herceptin maintenance every 3 weeks (Today's last dose of Herceptin) 2. Started anastrozole 08/16/14 1 mg daily   Anastrozole toxicities:  No major side effects anastrozole. Denies any hot flashes or myalgias.  1. Musculoskeletal aches and pains: Improving with time  Neuropathy: Continue with Neurontin at bedtime   Herceptin toxicities:  1. Echocardiogram 12/26/2014: Ejection fraction 60-65%  2. Patient experienced leg swelling with Herceptin which resolved with Lasix.   Breast Cancer Surveillance: 1. Breast exam 01/31/2015: Normal 2. Mammogram  01/28/2015 No abnormalities. Postsurgical changes. Breast Density Category B. I recommended that she get 3-D mammograms for surveillance. Discussed the differences between different breast density categories.  Survivorship: I discussed with her the importance of losing weight and exercise every day. She is motivated to walk every day and lose the weight.  No orders of the defined types were placed in this encounter.   The patient has a good understanding of the overall plan. she agrees with it. she will call with any problems that may develop before the next visit here.   Rulon Eisenmenger, MD

## 2015-02-06 ENCOUNTER — Other Ambulatory Visit: Payer: Self-pay | Admitting: Surgery

## 2015-02-06 NOTE — H&P (Signed)
Regina Deleon  Location: [facility] Patient #: [id number] DOB: [birth year] Married / Language: English / Race: Black or African American Female  History of Present Illness   Patient words: post-op.   The patient is a 64 year old female who presents with breast cancer.    Her PCP is Dr. Tiburcio Bash [facility]).  Her treating oncologist are Drs. Philis Nettle.  The patient comes by herself.   She is doing well. She is to see dr. Lindi Adie at the end of September. She expects to end the Herceptin on 02/22/2015. We can schedule removal of the port without seeing her back. She had had some neuropathy. Dr. Hulen Skains had given her some Neurontin - but this made her drowsy. She backed off on the amount and takes it occasionally. She wants to put heat on her left shoulder/chest. I said that is okay, just be careful not to burn your skin. She showed me her prosthesis. The prosthesis she said cost $8,000. It looks like it fits well and she is happy with it.  History of left breast cancer (06/2014): Ms. Redford had a left MRM on 06/26/2014. Her final path - [id number] - no residual cancer, 0/20 nodes.  IR placed power port 01/31/2014. She is Her2Neu positive - she received herceptin for one year  History of Breast Cancer: Her last mammogram was about 2013. She has no family history of breast cancer. Her last period was 64 when she had hysterectomy. She was placed on Premarin after hysterectomy, but stopped this about 1995. Ms. Horn had screening mammography at the breast Center on 12/27/2013 that showed an abnormality in her left breast. She went for further diagnostic tests on 01/10/2014 that showed a 1.8 cm suspicious mass (by [place]) (measures 2.1 cm by mammogram) at the 3:00 position with one abnormal appearing lymph node. She had a biopsy of her left breast on 01/10/2014 (Accession: [id number]) shows invasive ductal carcinoma and invasive cancer in  one lymph node positive for metastatic ductal carcinoma.  ER positive and HER2Neu positive. T2, N1, M0, Stage IIB   She underwent neoadjuvant chemotx for left breast cancer by Dr. Lindi Adie. She got through most of the chemotx, but had increasing neurologic symptoms and stopped her treatmetn early. Her last treatment was 05/23/2014.  Past Medical History: 1. Ms. Vannatter is a Lap Band patient of mine. I did a Lap Band (10 cm) in 10/2005.  She has done well with the surgery, though she has gained about 18 pounds since I last saw her. 2. 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 She has 2 children: Debroah Baller age 37 who lives in [place] and "Sport" age 31 lives in [place].  Her husband is also retired.   Past Surgical History Shann Medal, MD; 01/17/2015 3:45 PM) Breast Biopsy Left. Hysterectomy (not due to cancer) - Partial Lap Band Spinal Surgery Midback  Allergies (Sonya Bynum, CMA; 01/17/2015 3:17 PM) Darvocet A500 *ANALGESICS - OPIOID* Latex Gloves *MEDICAL DEVICES*  Medication History Davy Pique Bynum, CMA; 01/17/2015 3:17 PM) Neurontin (300MG  Capsule, 1 (one) Capsule Oral two times daily, Taken starting 07/16/2014) Active. Procardia XL (30MG  Tablet ER 24HR, twice Oral daily) Active. Ambien (5MG  Tablet, Oral) Active. Aspirin Childrens (81MG  Tablet Chewable, Oral) Active. Omega 3 (1000MG  Capsule, Oral) Active. Zantac 150 Maximum Strength (150MG  Tablet, Oral) Active. Medications Reconciled  Review of Systems Shanon Brow H. Lucia Gaskins MD; 01/17/2015 3:45 PM) Breast Present- Breast Pain. Not Present-  Breast Mass, Nipple Discharge and Skin Changes.   Vitals (Sonya Bynum CMA; 01/17/2015 3:16 PM) 01/17/2015 3:16 PM Weight: 201 lb Height: 63in Body Surface Area: 2.01 m Body Mass Index: 35.61 kg/m Temp.: 51F(Temporal)  Pulse: 76 (Regular)  BP: 134/78 (Sitting, Left  Arm, Standard)  Physical Exam  GENERAL: WN AA F. In good spirits.  HEENT: Pupils equal. Dentition good. NECK: Supple. No thyroid mass.  LYMPH NODES: No cervical, supraclavicular, or axillary adenopathy.  BREASTS - RIGHT: No palpable mass or nodule. No nipple discharge.  LEFT: Absent breast. The wound looks good.  She showed me her prosthesis, which seems to fit well. The prosthesis she said cost $8,000.  UPPER EXTREMITIES: Mild lymphedema. She is wearinig a sleeve on her left arm.  Assessment & Plan  1.  MALIGNANT NEOPLASM OF UPPER-OUTER QUADRANT OF LEFT FEMALE BREAST (C50.412)  Story: Left breast on 01/10/2014 (Accession: [id number]) shows invasive ductal carcinoma and invasive cancer in one lymph node positive for metastatic ductal carcinoma.  ER positive and HER2Neu positive.  T2, N1, M0, Stage IIB   Neo adjuvant chemotx and then one year of herceptin   She had a left MRM on 06/26/2014. Her final path - [id number] - no residual cancer, 0/20 nodes   Sees Drs. Lindi Adie and Squire   Anti-estrogen - Anastrozole.   I'll see her back in 6 months.  She has completed her herceptin - plan to remove power port under local anesthesia.  Note - the power port was placed by interventional radiology jnto the right IJ Owens Shark on 01/31/2014.  2.  HISTORY OF LAPAROSCOPIC ADJUSTABLE GASTRIC BANDING (Z98.84)  Story: Had a Lap Band (10 cm) placed 10/19/2005 - D. Kellie Chisolm  Initial weight - 222, BMI - 39.45.  3.  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

## 2015-02-07 ENCOUNTER — Ambulatory Visit (HOSPITAL_BASED_OUTPATIENT_CLINIC_OR_DEPARTMENT_OTHER)
Admission: RE | Admit: 2015-02-07 | Discharge: 2015-02-07 | Disposition: A | Discharge status: Home / Self Care | Payer: BC Managed Care – PPO | Source: Ambulatory Visit | Attending: Surgery | Admitting: Surgery

## 2015-02-07 ENCOUNTER — Ambulatory Visit (HOSPITAL_BASED_OUTPATIENT_CLINIC_OR_DEPARTMENT_OTHER): Payer: BC Managed Care – PPO | Admitting: Anesthesiology

## 2015-02-07 ENCOUNTER — Encounter (HOSPITAL_BASED_OUTPATIENT_CLINIC_OR_DEPARTMENT_OTHER): Payer: Self-pay

## 2015-02-07 ENCOUNTER — Encounter (HOSPITAL_BASED_OUTPATIENT_CLINIC_OR_DEPARTMENT_OTHER)
Admission: RE | Disposition: A | Discharge status: Home / Self Care | Payer: Self-pay | Source: Ambulatory Visit | Attending: Surgery

## 2015-02-07 DIAGNOSIS — Z880 Allergy status to penicillin: Secondary | ICD-10-CM | POA: Diagnosis not present

## 2015-02-07 DIAGNOSIS — Z91040 Latex allergy status: Secondary | ICD-10-CM | POA: Diagnosis not present

## 2015-02-07 DIAGNOSIS — N644 Mastodynia: Secondary | ICD-10-CM | POA: Diagnosis not present

## 2015-02-07 DIAGNOSIS — I10 Essential (primary) hypertension: Secondary | ICD-10-CM | POA: Diagnosis not present

## 2015-02-07 DIAGNOSIS — Z452 Encounter for adjustment and management of vascular access device: Secondary | ICD-10-CM | POA: Diagnosis not present

## 2015-02-07 DIAGNOSIS — Z853 Personal history of malignant neoplasm of breast: Secondary | ICD-10-CM | POA: Insufficient documentation

## 2015-02-07 DIAGNOSIS — Z8589 Personal history of malignant neoplasm of other organs and systems: Secondary | ICD-10-CM | POA: Diagnosis not present

## 2015-02-07 DIAGNOSIS — Z888 Allergy status to other drugs, medicaments and biological substances status: Secondary | ICD-10-CM | POA: Diagnosis not present

## 2015-02-07 DIAGNOSIS — G629 Polyneuropathy, unspecified: Secondary | ICD-10-CM | POA: Insufficient documentation

## 2015-02-07 DIAGNOSIS — Z885 Allergy status to narcotic agent status: Secondary | ICD-10-CM | POA: Diagnosis not present

## 2015-02-07 HISTORY — PX: PORT-A-CATH REMOVAL: SHX5289

## 2015-02-07 SURGERY — MINOR REMOVAL PORT-A-CATH
Anesthesia: Monitor Anesthesia Care | Site: Chest

## 2015-02-07 MED ORDER — CHLORHEXIDINE GLUCONATE 4 % EX LIQD: 1.0000 "application " | Freq: Once | CUTANEOUS | Status: DC

## 2015-02-07 MED ORDER — LIDOCAINE-EPINEPHRINE (PF) 1 %-1:200000 IJ SOLN
INTRAMUSCULAR | Status: AC
  Filled 2015-02-07: qty 30

## 2015-02-07 MED ORDER — LIDOCAINE-EPINEPHRINE 1 %-1:100000 IJ SOLN
INTRAMUSCULAR | Status: DC | PRN
  Administered 2015-02-07: 13 mL

## 2015-02-07 MED ORDER — LIDOCAINE-EPINEPHRINE 1 %-1:100000 IJ SOLN
INTRAMUSCULAR | Status: AC
  Filled 2015-02-07: qty 1

## 2015-02-07 MED ORDER — POVIDONE-IODINE 10 % EX OINT
TOPICAL_OINTMENT | CUTANEOUS | Status: AC
  Filled 2015-02-07: qty 28.35

## 2015-02-07 SURGICAL SUPPLY — 35 items
APL SKNCLS STERI-STRIP NONHPOA (GAUZE/BANDAGES/DRESSINGS)
BANDAGE ADH SHEER 1  50/CT (GAUZE/BANDAGES/DRESSINGS) IMPLANT
BENZOIN TINCTURE PRP APPL 2/3 (GAUZE/BANDAGES/DRESSINGS) IMPLANT
BLADE SURG 15 STRL LF DISP TIS (BLADE) ×1 IMPLANT
BLADE SURG 15 STRL SS (BLADE) ×3
CLOSURE WOUND 1/2 X4 (GAUZE/BANDAGES/DRESSINGS)
CLOSURE WOUND 1/4X4 (GAUZE/BANDAGES/DRESSINGS)
COVER SURGICAL LIGHT HANDLE (MISCELLANEOUS) ×3 IMPLANT
DRAPE UTILITY XL STRL (DRAPES) ×3 IMPLANT
GAUZE SPONGE 4X4 16PLY XRAY LF (GAUZE/BANDAGES/DRESSINGS) ×3 IMPLANT
GLOVE SURG SIGNA 7.5 PF LTX (GLOVE) ×1 IMPLANT
GLOVE SURG SS PI 7.0 STRL IVOR (GLOVE) ×2 IMPLANT
GLOVE SURG SS PI 7.5 STRL IVOR (GLOVE) ×2 IMPLANT
LIQUID BAND (GAUZE/BANDAGES/DRESSINGS) ×3 IMPLANT
MARKER SKIN DUAL TIP RULER LAB (MISCELLANEOUS) ×3 IMPLANT
NDL HYPO 25X1 1.5 SAFETY (NEEDLE) IMPLANT
NDL HYPO 25X5/8 SAFETYGLIDE (NEEDLE) ×1 IMPLANT
NDL SAFETY ECLIPSE 18X1.5 (NEEDLE) ×1 IMPLANT
NEEDLE HYPO 18GX1.5 SHARP (NEEDLE) ×3
NEEDLE HYPO 25X1 1.5 SAFETY (NEEDLE) IMPLANT
NEEDLE HYPO 25X5/8 SAFETYGLIDE (NEEDLE) ×3 IMPLANT
NS IRRIG 1000ML POUR BTL (IV SOLUTION) IMPLANT
SPONGE GAUZE 4X4 12PLY STER LF (GAUZE/BANDAGES/DRESSINGS) IMPLANT
STRIP CLOSURE SKIN 1/2X4 (GAUZE/BANDAGES/DRESSINGS) IMPLANT
STRIP CLOSURE SKIN 1/4X4 (GAUZE/BANDAGES/DRESSINGS) IMPLANT
SUT MNCRL AB 4-0 PS2 18 (SUTURE) ×3 IMPLANT
SUT VIC AB 3-0 SH 27 (SUTURE) ×3
SUT VIC AB 3-0 SH 27X BRD (SUTURE) ×1 IMPLANT
SUT VIC AB 5-0 PS2 18 (SUTURE) IMPLANT
SUT VICRYL 3-0 CR8 SH (SUTURE) IMPLANT
SUT VICRYL 4-0 PS2 18IN ABS (SUTURE) IMPLANT
SWABSTICK POVIDONE IODINE SNGL (MISCELLANEOUS) ×6 IMPLANT
SYR CONTROL 10ML LL (SYRINGE) ×3 IMPLANT
TOWEL OR NON WOVEN STRL DISP B (DISPOSABLE) ×3 IMPLANT
UNDERPAD 30X30 (UNDERPADS AND DIAPERS) IMPLANT

## 2015-02-07 NOTE — Discharge Instructions (Signed)
[facility] - DISCHARGE INSTRUCTIONS TO PATIENT  Activity:  Driving - May drive   Lifting - no limit  Wound Care:   Leave incision dry for 2 days, then may shower  Diet:  As tolerated  Follow up appointment:  Follow up for breast cancer in 6 months.  Call Dr. Pollie Friar office [facility]) at [phone] for a question about the incision.  Medications and dosages:  Resume your home medications.  Call Dr. Lucia Gaskins or his office  [phone]) if you have:  Redness, tenderness, or signs of infection (pain, swelling, redness, odor or green/yellow discharge around the site),  Any other questions or concerns you may have after discharge.  In an emergency, call 911 or go to an Emergency Department at a nearby hospital.

## 2015-02-07 NOTE — Op Note (Signed)
02/07/2015  8:00 AM  PATIENT:  Regina Deleon, 64 y.o., female, MRN: [id number]  PREOP DIAGNOSIS:  breast cancer completion of chemotx  POSTOP DIAGNOSIS:   Completion of chemotherapy for breast cancer  PROCEDURE:   Procedure(s): REMOVAL PORT-A-CATH  SURGEON:   Alphonsa Overall, M.D.    ANESTHESIA:   Local  EBL:  minimal  ml   LOCAL MEDICATIONS USED:   13 cc 1% xylocaine  SPECIMEN:   none  COUNTS CORRECT:  YES  INDICATIONS FOR PROCEDURE:  [name] is a 64 y.o. (DOB: [birth year]) AA  female whose primary care physician is Lavera Guise, MD and comes for removal of her power port.  She has completed chemotx.   Her treating oncologists are Drs. Philis Nettle.   The indications and risks of the surgery were explained to the patient.  The risks include, but are not limited to, infection, bleeding, and nerve injury.  PROCEDURE:  The patient was taken to room #6 at [facility].  This was done under local.   A time out was held and the surgical check list run.   Her right upper chest was prepped with betadine. The port lay just inferior to the medial aspect of the right clavicle.  The skin around the power port was infiltrated with 13 cc of 1% xylocaine.  I went through the prior incision and cut down to the power port.  The tubing and port were removed intact.  I closed the subcutaneous tissues with 3-0 vicryl.  I closed the skin with 4-0 Monocryl.   The skin was painted with LiquiBand.   The patient tolerated the procedure well.  She will see me in 6 months for her regular breast cancer follow up.  She knows that is there are some questions about the incision, she will contact our office.  Alphonsa Overall, MD, [facility] Pager: [phone] Office phone:  [phone]

## 2015-02-07 NOTE — Interval H&P Note (Signed)
History and Physical Interval Note:  02/07/2015 7:23 AM  Regina Deleon  has presented today for surgery, with the diagnosis of breast cancer completion of chemotx  The various methods of treatment have been discussed with the patient and family.  Her husband is with her.  After consideration of risks, benefits and other options for treatment, the patient has consented to  Procedure(s): MINOR REMOVAL PORT-A-CATH (N/A) as a surgical intervention .  The patient's history has been reviewed, patient examined, no change in status, stable for surgery.  I have reviewed the patient's chart and labs.  Questions were answered to the patient's satisfaction.     [name]

## 2015-02-07 NOTE — Anesthesia Preprocedure Evaluation (Addendum)
Anesthesia Evaluation  Patient identified by MRN, date of birth, ID band Patient awake    Reviewed: Allergy & Precautions, NPO status , Patient's Chart, lab work & pertinent test results  Airway Mallampati: II  TM Distance: >3 FB Neck ROM: Full    Dental no notable dental hx.    Pulmonary neg pulmonary ROS,    Pulmonary exam normal breath sounds clear to auscultation       Cardiovascular hypertension, Pt. on medications Normal cardiovascular exam Rhythm:Regular Rate:Normal     Neuro/Psych negative neurological ROS  negative psych ROS   GI/Hepatic negative GI ROS, Neg liver ROS,   Endo/Other  negative endocrine ROS  Renal/GU negative Renal ROS  negative genitourinary   Musculoskeletal negative musculoskeletal ROS (+)   Abdominal   Peds negative pediatric ROS (+)  Hematology negative hematology ROS (+)   Anesthesia Other Findings   Reproductive/Obstetrics negative OB ROS                            Anesthesia Physical Anesthesia Plan  ASA: II  Anesthesia Plan: MAC   Post-op Pain Management:    Induction: Intravenous  Airway Management Planned: Simple Face Mask  Additional Equipment:   Intra-op Plan:   Post-operative Plan:   Informed Consent: I have reviewed the patients History and Physical, chart, labs and discussed the procedure including the risks, benefits and alternatives for the proposed anesthesia with the patient or authorized representative who has indicated his/her understanding and acceptance.   Dental advisory given  Plan Discussed with: CRNA and Surgeon  Anesthesia Plan Comments:         Anesthesia Quick Evaluation

## 2015-02-07 NOTE — Anesthesia Postprocedure Evaluation (Signed)
  Anesthesia Post-op Note  Patient: Regina Deleon  Procedure(s) Performed: Procedure(s) (LRB): MINOR REMOVAL PORT-A-CATH (N/A)  Patient Location: PACU  Anesthesia Type: MAC  Level of Consciousness: awake and alert   Airway and Oxygen Therapy: Patient Spontanous Breathing  Post-op Pain: mild  Post-op Assessment: Post-op Vital signs reviewed, Patient's Cardiovascular Status Stable, Respiratory Function Stable, Patent Airway and No signs of Nausea or vomiting  Last Vitals:  Filed Vitals:   02/07/15 0808  BP: 161/72  Pulse: 92  Temp: 36.4 C  Resp: 20    Post-op Vital Signs: stable   Complications: No apparent anesthesia complications

## 2015-02-08 ENCOUNTER — Encounter (HOSPITAL_BASED_OUTPATIENT_CLINIC_OR_DEPARTMENT_OTHER): Payer: Self-pay | Admitting: Surgery

## 2015-02-21 ENCOUNTER — Other Ambulatory Visit: Payer: Self-pay | Admitting: *Deleted

## 2015-02-21 DIAGNOSIS — C50212 Malignant neoplasm of upper-inner quadrant of left female breast: Secondary | ICD-10-CM

## 2015-02-21 MED ORDER — GABAPENTIN 300 MG PO CAPS: 300.0000 mg | ORAL_CAPSULE | Freq: Two times a day (BID) | ORAL | Status: DC

## 2015-02-21 NOTE — Telephone Encounter (Signed)
Neurontin: Refill. Patient notified and verbalized understanding.

## 2015-03-01 ENCOUNTER — Telehealth: Payer: Self-pay | Admitting: *Deleted

## 2015-03-01 NOTE — Telephone Encounter (Signed)
Patient advised that she can get flu shot, she verbalized understanding.

## 2015-03-01 NOTE — Telephone Encounter (Signed)
Patient called inquiring if she can receive flu shot? Message sent to MD Hanley Seamen

## 2015-04-02 ENCOUNTER — Other Ambulatory Visit: Payer: Self-pay | Admitting: Nurse Practitioner

## 2015-04-02 ENCOUNTER — Telehealth: Payer: Self-pay

## 2015-04-02 DIAGNOSIS — E2839 Other primary ovarian failure: Secondary | ICD-10-CM

## 2015-04-02 NOTE — Telephone Encounter (Signed)
Pt called stating her PCP ordered her bone density at [facility] and she wants it done at [facility] system. Instructed pt to call her PCP and have her r/s bone density in [facility] system. Then to have the results released to Dr Lindi Adie and to her PCP. Pt was receptive to this.

## 2015-05-04 ENCOUNTER — Other Ambulatory Visit: Payer: Self-pay | Admitting: Hematology and Oncology

## 2015-05-08 ENCOUNTER — Ambulatory Visit
Admission: RE | Admit: 2015-05-08 | Discharge: 2015-05-08 | Disposition: A | Discharge status: Home / Self Care | Payer: BC Managed Care – PPO | Source: Ambulatory Visit | Attending: Nurse Practitioner | Admitting: Nurse Practitioner

## 2015-05-08 DIAGNOSIS — E2839 Other primary ovarian failure: Secondary | ICD-10-CM

## 2015-05-08 NOTE — Telephone Encounter (Signed)
Chart reviewed.

## 2015-05-10 ENCOUNTER — Other Ambulatory Visit: Payer: Self-pay

## 2015-05-10 ENCOUNTER — Telehealth: Payer: Self-pay | Admitting: *Deleted

## 2015-05-10 MED ORDER — GABAPENTIN 300 MG PO CAPS: 300.0000 mg | ORAL_CAPSULE | Freq: Two times a day (BID) | ORAL | Status: DC

## 2015-05-10 NOTE — Telephone Encounter (Signed)
Second call received from Regina Deleon for results from [facility] nurse.  "I was on break previously from this medicine.  Can I take another break.  I try ibuprofen and tonic water.  I hurt worse at night.  I do not want to feel like this anymore.  Over time it has progressed to where I was before when I took a break."  Advised I will addend the first message.

## 2015-05-10 NOTE — Telephone Encounter (Signed)
"  I had Bone Scan Wednesday ordered by my PCP.  I asked results also to be sent to Dr, Lindi Adie.  Calling to get results.  Have also called PCP but I am on anastrozole so I really want to hear from Dr. Lindi Adie.  I feel stiff, joint pain or changes in my legs and hips."  Return number [phone].

## 2015-05-10 NOTE — Telephone Encounter (Signed)
Per Dr. Lindi Adie, Let pt knnow to hold the anastrazole until she returns in March at which time they will discuss alternatives.  Pt voiced understanding.

## 2015-05-22 ENCOUNTER — Other Ambulatory Visit: Payer: Self-pay | Admitting: Hematology and Oncology

## 2015-05-22 DIAGNOSIS — C50212 Malignant neoplasm of upper-inner quadrant of left female breast: Secondary | ICD-10-CM

## 2015-06-24 NOTE — Telephone Encounter (Deleted)
err

## 2015-07-04 DIAGNOSIS — R309 Painful micturition, unspecified: Secondary | ICD-10-CM | POA: Diagnosis not present

## 2015-07-04 DIAGNOSIS — Z6834 Body mass index (BMI) 34.0-34.9, adult: Secondary | ICD-10-CM | POA: Diagnosis not present

## 2015-07-04 DIAGNOSIS — N200 Calculus of kidney: Secondary | ICD-10-CM | POA: Diagnosis not present

## 2015-07-04 DIAGNOSIS — R1032 Left lower quadrant pain: Secondary | ICD-10-CM | POA: Diagnosis not present

## 2015-07-04 DIAGNOSIS — N132 Hydronephrosis with renal and ureteral calculous obstruction: Secondary | ICD-10-CM | POA: Diagnosis not present

## 2015-07-04 DIAGNOSIS — I10 Essential (primary) hypertension: Secondary | ICD-10-CM | POA: Diagnosis not present

## 2015-07-04 DIAGNOSIS — R932 Abnormal findings on diagnostic imaging of liver and biliary tract: Secondary | ICD-10-CM | POA: Diagnosis not present

## 2015-07-04 DIAGNOSIS — R918 Other nonspecific abnormal finding of lung field: Secondary | ICD-10-CM | POA: Diagnosis not present

## 2015-07-25 DIAGNOSIS — Z7982 Long term (current) use of aspirin: Secondary | ICD-10-CM | POA: Diagnosis not present

## 2015-07-25 DIAGNOSIS — N200 Calculus of kidney: Secondary | ICD-10-CM | POA: Diagnosis not present

## 2015-07-25 DIAGNOSIS — N201 Calculus of ureter: Secondary | ICD-10-CM | POA: Diagnosis not present

## 2015-07-25 DIAGNOSIS — Z6835 Body mass index (BMI) 35.0-35.9, adult: Secondary | ICD-10-CM | POA: Diagnosis not present

## 2015-07-25 DIAGNOSIS — I10 Essential (primary) hypertension: Secondary | ICD-10-CM | POA: Diagnosis not present

## 2015-07-25 DIAGNOSIS — N202 Calculus of kidney with calculus of ureter: Secondary | ICD-10-CM | POA: Diagnosis not present

## 2015-07-30 NOTE — Assessment & Plan Note (Signed)
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 AxLN biopsy: ER 70% PR 0% Ki-67 80% HER-2 +2.92, gene copy #5.5 T2, N1, M0 stage IIB  Status post neoadjuvant chemotherapy with TCH Perjeta from 02/09/2014- 05/24/2014 (last cycle was given without chemotherapy)  Left breast mastectomy 06/26/2014: Complete pathologic response, 20 lymph nodes negative Adj Herceptin completed9/29/16  Current treatment:  Started anastrozole 08/16/14 1 mg daily   Anastrozole toxicities:  No major side effects anastrozole. Denies any hot flashes or myalgias.  1. Musculoskeletal aches and pains: Improving with time  Neuropathy: Continue with Neurontin at bedtime   Breast Cancer Surveillance: 1. Breast exam 01/31/2015: Normal 2. Mammogram 01/28/2015 No abnormalities. Postsurgical changes. Breast Density Category B. I recommended that she get 3-D mammograms for surveillance. Discussed the differences between different breast density categories.  Survivorship: I discussed with her the importance of losing weight and exercise every day. She is motivated to walk every day and lose the weight.  RTC 6 months 

## 2015-07-31 ENCOUNTER — Ambulatory Visit: Payer: BC Managed Care – PPO | Admitting: Hematology and Oncology

## 2015-08-05 ENCOUNTER — Telehealth: Payer: Self-pay

## 2015-08-05 NOTE — Telephone Encounter (Signed)
LMOVM - pt to return call to clinic - ask for desk RN

## 2015-08-05 NOTE — Telephone Encounter (Signed)
Patient returned call.  Transferred to ext [phone].  Voicemail received.

## 2015-08-05 NOTE — Telephone Encounter (Signed)
Patient calling because she did not know that she had an appt on 07/31/15- she was marked a no show and received a letter from the [facility].  Patient states that she stopped taking the arimidex per Dr. Geralyn Flash instructions and was waiting for schedulers to call her with an appt. Patient is calling to schedule an appt and was hoping to get in this week to see Dr. Lindi Adie.  Patient requesting for RN to call her with an appt.

## 2015-08-06 ENCOUNTER — Ambulatory Visit: Payer: BC Managed Care – PPO | Admitting: Hematology and Oncology

## 2015-08-06 ENCOUNTER — Telehealth: Payer: Self-pay | Admitting: Hematology and Oncology

## 2015-08-06 ENCOUNTER — Encounter: Payer: Self-pay | Admitting: Hematology and Oncology

## 2015-08-06 ENCOUNTER — Ambulatory Visit (HOSPITAL_BASED_OUTPATIENT_CLINIC_OR_DEPARTMENT_OTHER): Payer: PPO | Admitting: Hematology and Oncology

## 2015-08-06 VITALS — BP 138/68 | HR 94 | Temp 97.8°F | Resp 18 | Ht 63.0 in | Wt 207.4 lb

## 2015-08-06 DIAGNOSIS — G629 Polyneuropathy, unspecified: Secondary | ICD-10-CM

## 2015-08-06 DIAGNOSIS — C50212 Malignant neoplasm of upper-inner quadrant of left female breast: Secondary | ICD-10-CM | POA: Diagnosis not present

## 2015-08-06 MED ORDER — LETROZOLE 2.5 MG PO TABS: 2.5000 mg | ORAL_TABLET | Freq: Every day | ORAL | Status: DC

## 2015-08-06 NOTE — Progress Notes (Signed)
Unable to get in to exam room prior to MD.  No assessment performed.  

## 2015-08-06 NOTE — Assessment & Plan Note (Signed)
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 AxLN biopsy: ER 70% PR 0% Ki-67 80% HER-2 +2.92, gene copy #5.5 T2, N1, M0 stage IIB  Status post neoadjuvant chemotherapy with TCH Perjeta from 02/09/2014- 05/24/2014 (last cycle was given without chemotherapy)  Left breast mastectomy 06/26/2014: Complete pathologic response, 20 lymph nodes negative  Herceptin maintenance every 3 weeks completed 01/31/2015  Current treatment: Started anastrozole 08/16/14 1 mg daily   Anastrozole toxicities:  No major side effects anastrozole. Denies any hot flashes.  Musculoskeletal aches and pains: Improving with time  Neuropathy: Continue with Neurontin at bedtime   Breast Cancer Surveillance: 1. Breast exam 08/06/15: Normal 2. Mammogram 01/28/2015 No abnormalities. Postsurgical changes. Breast Density Category B. I recommended that she get 3-D mammograms for surveillance. Discussed the differences between different breast density categories.  Survivorship: I discussed with her the importance of losing weight and exercise every day. She is motivated to walk every day and lose the weight.  Return to clinic in 6 months for follow-up

## 2015-08-06 NOTE — Progress Notes (Signed)
Patient Care Team: Lavera Guise, MD as PCP - General (Internal Medicine) Nicholas Lose, MD as Consulting Physician (Hematology and Oncology) Eppie Gibson, MD as Attending Physician (Radiation Oncology) Alphonsa Overall, MD as Consulting Physician (General Surgery)  DIAGNOSIS: Breast cancer of upper-inner quadrant of left female breast [facility])   Staging form: Breast, AJCC 7th Edition     Clinical: Stage IIB (T2, N1, cM0) - Signed by Eppie Gibson, MD on 01/31/2014     Pathologic: cM0 - Unsigned   SUMMARY OF ONCOLOGIC HISTORY:   Breast cancer of upper-inner quadrant of left female breast ([place])   12/27/2013 Mammogram Mammogram and ultrasound 1.8 cm mass left breast 3:00; 7 cm from nipple with abnormal 1 left axillary lymph node, 2.8 x 1.8 x 1.7 cm, second adjacent smaller nodule 1.2 x 1 x 0.7 cm   01/10/2014 Initial Diagnosis 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        1 AxLN biopsy: ER 70% PR 0% Ki-67 80% HER-2 +2.92, gene copy #5.5   01/19/2014 Breast MRI Left breast: 17 mm mass, with non-mass enhancement both anterior and posterior to the biopsy clip extends 3.2 cm anterior and 2.8 Sinemet as medial to the mass, 1 cm satellite nodule, 2 abnormal lymph nodes left axilla 3.8 cm, 1.4 cm   02/09/2014 - 05/24/2014 Neo-Adjuvant Chemotherapy Neoadjuvant chemotherapy with Taxotere, carboplatin, Herceptin and Perjeta x6 cycles   06/07/2014 Breast MRI Left breast: Soft tissue signal 2.1 x 3.1 cm, resolution of enhancement in the lateral portion of the breast, interval decrease in size of left axillary lymph nodes, no suspicious lymph nodes remain   06/26/2014 Surgery Left mastectomy: Pathologic complete response, 0/20 lymph nodes negative   08/16/2014 -  Anti-estrogen oral therapy anastrozole 1 mg daily switched to letrozole 2.5 mg 08/06/15 (muscle aches)    CHIEF COMPLIANT: Anastrozole related to musculoskeletal aches and pains  INTERVAL HISTORY: Regina Deleon is a  65 year old with above-mentioned history left breast cancer treated with neoadjuvant chemotherapy and had pathologic complete response. She had undergone mastectomy and is now on adjuvant anastrozole therapy. She did quit for 9 months and developed profound aches and pains and she discontinued it. She is here today to discuss what other options are available. She denies any lumps or nodules in the breasts.  REVIEW OF SYSTEMS:   Constitutional: Denies fevers, chills or abnormal weight loss Eyes: Denies blurriness of vision Ears, nose, mouth, throat, and face: Denies mucositis or sore throat Respiratory: Denies cough, dyspnea or wheezes Cardiovascular: Denies palpitation, chest discomfort Gastrointestinal:  Denies nausea, heartburn or change in bowel habits Skin: Denies abnormal skin rashes Lymphatics: Denies new lymphadenopathy or easy bruising Neurological: Complains of improving neuropathy in both her hands. Left hand slightly more worse then the right Behavioral/Psych: Mood is stable, no new changes  Extremities: No lower extremity edema Breast:  Left mastectomy All other systems were reviewed with the patient and are negative.  I have reviewed the past medical history, past surgical history, social history and family history with the patient and they are unchanged from previous note.  ALLERGIES:  is allergic to darvocet; latex; enalapril; and penicillins.  MEDICATIONS:  Current Outpatient Prescriptions  Medication Sig Dispense Refill  . acetaminophen (TYLENOL) 500 MG tablet Take 500 mg by mouth every 6 (six) hours as needed for mild pain.    Marland Kitchen aspirin 81 MG tablet Take 81 mg by mouth every morning.     Marland Kitchen  cyclobenzaprine (FLEXERIL) 10 MG tablet     . fluticasone (FLONASE) 50 MCG/ACT nasal spray Place 2 sprays into both nostrils daily as needed for allergies or rhinitis.    Marland Kitchen gabapentin (NEURONTIN) 300 MG capsule Take 1 capsule (300 mg total) by mouth 2 (two) times daily. 60 capsule 1    . letrozole (FEMARA) 2.5 MG tablet Take 1 tablet (2.5 mg total) by mouth daily. 90 tablet 3  . lidocaine-prilocaine (EMLA) cream Apply 1 application topically as needed. 30 g 0  . LORazepam (ATIVAN) 0.5 MG tablet Take 1 tablet (0.5 mg total) by mouth every 6 (six) hours as needed (Nausea or vomiting). 30 tablet 0  . NIFEdipine (PROCARDIA-XL/ADALAT-CC/NIFEDICAL-XL) 30 MG 24 hr tablet Take 30 mg by mouth 2 (two) times daily.     . Omega-3 Fatty Acids (OMEGA 3 PO) Take 2 tablets by mouth every morning.    . ranitidine (ZANTAC) 150 MG tablet Take 150 mg by mouth 2 (two) times daily.    Marland Kitchen triamterene-hydrochlorothiazide (MAXZIDE-25) 37.5-25 MG per tablet Take 0.5 tablets by mouth every morning. As needed.    . zolpidem (AMBIEN) 10 MG tablet Take 10 mg by mouth at bedtime as needed for sleep.     No current facility-administered medications for this visit.    PHYSICAL EXAMINATION: ECOG PERFORMANCE STATUS: 1 - Symptomatic but completely ambulatory  Filed Vitals:   08/06/15 1412  BP: 138/68  Pulse: 94  Temp: 97.8 F (36.6 C)  Resp: 18   Filed Weights   08/06/15 1412  Weight: 207 lb 6.4 oz (94.076 kg)    GENERAL:alert, no distress and comfortable SKIN: skin color, texture, turgor are normal, no rashes or significant lesions EYES: normal, Conjunctiva are pink and non-injected, sclera clear OROPHARYNX:no exudate, no erythema and lips, buccal mucosa, and tongue normal  NECK: supple, thyroid normal size, non-tender, without nodularity LYMPH:  no palpable lymphadenopathy in the cervical, axillary or inguinal LUNGS: clear to auscultation and percussion with normal breathing effort HEART: regular rate & rhythm and no murmurs and no lower extremity edema ABDOMEN:abdomen soft, non-tender and normal bowel sounds MUSCULOSKELETAL:no cyanosis of digits and no clubbing  NEURO: alert & oriented x 3 with fluent speech, no focal motor/sensory deficits EXTREMITIES: No lower extremity edema BREAST: No  palpable masses or nodules. No palpable axillary supraclavicular or infraclavicular adenopathy no breast tenderness or nipple discharge. (exam performed in the presence of a chaperone)  LABORATORY DATA:  I have reviewed the data as listed   Chemistry      Component Value Date/Time   NA 141 01/31/2015 0837   NA 141 08/16/2014 0921   K 3.4* 01/31/2015 0837   K 3.5 08/16/2014 0921   CL 105 08/16/2014 0921   CO2 26 01/31/2015 0837   CO2 28 08/16/2014 0921   BUN 11.4 01/31/2015 0837   BUN 14 08/16/2014 0921   CREATININE 0.9 01/31/2015 0837   CREATININE 0.72 08/16/2014 0921      Component Value Date/Time   CALCIUM 9.8 01/31/2015 0837   CALCIUM 10.5 08/16/2014 0921   ALKPHOS 106 01/31/2015 0837   ALKPHOS 86 08/16/2014 0921   AST 23 01/31/2015 0837   AST 24 08/16/2014 0921   ALT 23 01/31/2015 0837   ALT 20 08/16/2014 0921   BILITOT <0.30 01/31/2015 0837   BILITOT 0.2* 08/16/2014 0921      Lab Results  Component Value Date   WBC 5.4 01/31/2015   HGB 11.2* 01/31/2015   HCT 34.7* 01/31/2015   MCV  84.9 01/31/2015   PLT 241 01/31/2015   NEUTROABS 3.5 01/31/2015   ASSESSMENT & PLAN:  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 AxLN biopsy: ER 70% PR 0% Ki-67 80% HER-2 +2.92, gene copy #5.5 T2, N1, M0 stage IIB  Status post neoadjuvant chemotherapy with TCH Perjeta from 02/09/2014- 05/24/2014 (last cycle was given without chemotherapy)  Left breast mastectomy 06/26/2014: Complete pathologic response, 20 lymph nodes negative  Herceptin maintenance every 3 weeks completed 01/31/2015  Current treatment: Started anastrozole 08/16/14 1 mg daily switch to letrozole 2.5 mg daily for 4 2017 due to musculoskeletal aches and pains  Neuropathy: Continue with Neurontin now taking it twice a day  Breast Cancer Surveillance: 1. Breast exam 08/06/15: Normal 2. Mammogram 01/28/2015 No  abnormalities. Postsurgical changes. Breast Density Category B. I recommended that she get 3-D mammograms for surveillance. Discussed the differences between different breast density categories.  Survivorship: I discussed with her the importance of losing weight and exercise every day. She is motivated to walk every day and lose the weight.  Return to clinic in 6 months for follow-up   No orders of the defined types were placed in this encounter.   The patient has a good understanding of the overall plan. she agrees with it. she will call with any problems that may develop before the next visit here.   Rulon Eisenmenger, MD 08/06/2015

## 2015-08-06 NOTE — Telephone Encounter (Signed)
appt made and avs printed °

## 2015-08-13 ENCOUNTER — Other Ambulatory Visit: Payer: Self-pay | Admitting: Nurse Practitioner

## 2015-08-13 ENCOUNTER — Ambulatory Visit
Admission: RE | Admit: 2015-08-13 | Discharge: 2015-08-13 | Disposition: A | Discharge status: Home / Self Care | Payer: PPO | Source: Ambulatory Visit | Attending: Nurse Practitioner | Admitting: Nurse Practitioner

## 2015-08-13 DIAGNOSIS — M25532 Pain in left wrist: Secondary | ICD-10-CM | POA: Insufficient documentation

## 2015-08-13 DIAGNOSIS — G5602 Carpal tunnel syndrome, left upper limb: Secondary | ICD-10-CM | POA: Diagnosis not present

## 2015-08-13 DIAGNOSIS — I10 Essential (primary) hypertension: Secondary | ICD-10-CM | POA: Diagnosis not present

## 2015-08-13 DIAGNOSIS — C50519 Malignant neoplasm of lower-outer quadrant of unspecified female breast: Secondary | ICD-10-CM | POA: Diagnosis not present

## 2015-08-13 DIAGNOSIS — Z124 Encounter for screening for malignant neoplasm of cervix: Secondary | ICD-10-CM | POA: Diagnosis not present

## 2015-08-13 DIAGNOSIS — Z0001 Encounter for general adult medical examination with abnormal findings: Secondary | ICD-10-CM | POA: Diagnosis not present

## 2015-08-16 DIAGNOSIS — E782 Mixed hyperlipidemia: Secondary | ICD-10-CM | POA: Diagnosis not present

## 2015-08-16 DIAGNOSIS — E559 Vitamin D deficiency, unspecified: Secondary | ICD-10-CM | POA: Diagnosis not present

## 2015-08-16 DIAGNOSIS — Z0001 Encounter for general adult medical examination with abnormal findings: Secondary | ICD-10-CM | POA: Diagnosis not present

## 2015-08-16 DIAGNOSIS — I10 Essential (primary) hypertension: Secondary | ICD-10-CM | POA: Diagnosis not present

## 2015-09-06 DIAGNOSIS — M654 Radial styloid tenosynovitis [de Quervain]: Secondary | ICD-10-CM | POA: Diagnosis not present

## 2015-11-28 ENCOUNTER — Telehealth: Payer: Self-pay | Admitting: *Deleted

## 2015-11-28 NOTE — Telephone Encounter (Signed)
"  I  have no energy and feel fatigue.  For the last few weeks but today has been extremely bad.  I think it's coming from the letrozole.  What does Dr. Lindi Adie recommend.  I do not hurt, no s.o.b., I'm eating small meals and drinking but not a lot lately.  No change in bowel or bladder habits.  I have trouble sleeping but use Ambien and eventually fall asleep.neuropathy to my feet is tolerable and I use a heating pad at night for this.  Call my home [phone] and it's okay to leave a message."

## 2015-12-04 NOTE — Telephone Encounter (Signed)
Information reviewed with Dr. Lindi Adie.  Called pt back to discuss symptoms and recommendations.  Per Dr. Lindi Adie, pt to start taking 5,000 IU of Vitamin D as well as 500mg  Turmeric and Black Pepper.  Discussed these with pt who is in agreement to try these supplements.  Pt also mentioning that last evening her neuropathy was much worse.  She feels she has noticed the neuropathy worsening lately and is frustrated as it had been improving.  Pt currently taking neurontin 300mg  BID.  Informed pt I would discuss this with Dr. Lindi Adie and let her know recommendations for her neuropathy.  Pt without further questions or concerns at time of call.

## 2015-12-12 DIAGNOSIS — I10 Essential (primary) hypertension: Secondary | ICD-10-CM | POA: Diagnosis not present

## 2015-12-12 DIAGNOSIS — E782 Mixed hyperlipidemia: Secondary | ICD-10-CM | POA: Diagnosis not present

## 2015-12-12 DIAGNOSIS — M545 Low back pain: Secondary | ICD-10-CM | POA: Diagnosis not present

## 2015-12-12 DIAGNOSIS — C50519 Malignant neoplasm of lower-outer quadrant of unspecified female breast: Secondary | ICD-10-CM | POA: Diagnosis not present

## 2015-12-12 DIAGNOSIS — M791 Myalgia: Secondary | ICD-10-CM | POA: Diagnosis not present

## 2015-12-12 DIAGNOSIS — F5102 Adjustment insomnia: Secondary | ICD-10-CM | POA: Diagnosis not present

## 2015-12-28 ENCOUNTER — Other Ambulatory Visit: Payer: Self-pay | Admitting: Hematology and Oncology

## 2015-12-28 DIAGNOSIS — C50212 Malignant neoplasm of upper-inner quadrant of left female breast: Secondary | ICD-10-CM

## 2016-01-07 ENCOUNTER — Other Ambulatory Visit: Payer: Self-pay | Admitting: Hematology and Oncology

## 2016-01-07 DIAGNOSIS — Z1231 Encounter for screening mammogram for malignant neoplasm of breast: Secondary | ICD-10-CM

## 2016-01-30 ENCOUNTER — Ambulatory Visit: Payer: PPO

## 2016-01-31 ENCOUNTER — Ambulatory Visit
Admission: RE | Admit: 2016-01-31 | Discharge: 2016-01-31 | Disposition: A | Discharge status: Home / Self Care | Payer: PPO | Source: Ambulatory Visit | Attending: Hematology and Oncology | Admitting: Hematology and Oncology

## 2016-01-31 DIAGNOSIS — Z1231 Encounter for screening mammogram for malignant neoplasm of breast: Secondary | ICD-10-CM | POA: Diagnosis not present

## 2016-02-04 NOTE — Assessment & Plan Note (Signed)
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 AxLN biopsy: ER 70% PR 0% Ki-67 80% HER-2 +2.92, gene copy #5.5 T2, N1, M0 stage IIB  Status post neoadjuvant chemotherapy with TCH Perjeta from 02/09/2014- 05/24/2014 (last cycle was given without chemotherapy)  Left breast mastectomy 06/26/2014: Complete pathologic response, 20 lymph nodes negative  Herceptin maintenance every 3 weeks completed 01/31/2015  Current treatment: Started anastrozole 08/16/14 1 mg daily switch to letrozole 2.5 mg daily 08/06/2015 due to musculoskeletal aches and pains  Neuropathy: Continue with Neurontin now taking it twice a day  Breast Cancer Surveillance: 1. Breast exam 02/05/16: Normal 2. Mammogram 01/28/2015 No abnormalities. Postsurgical changes. Breast Density Category B. I recommended that she get 3-D mammograms for surveillance. Discussed the differences between different breast density categories.  Survivorship: I discussed with her the importance of losing weight and exercise every day. She is motivated to walk every day and lose the weight.  Return to clinic in 6 months for follow-up

## 2016-02-05 ENCOUNTER — Telehealth: Payer: Self-pay | Admitting: Hematology and Oncology

## 2016-02-05 ENCOUNTER — Ambulatory Visit (HOSPITAL_BASED_OUTPATIENT_CLINIC_OR_DEPARTMENT_OTHER): Payer: PPO | Admitting: Hematology and Oncology

## 2016-02-05 ENCOUNTER — Encounter: Payer: Self-pay | Admitting: Hematology and Oncology

## 2016-02-05 DIAGNOSIS — M791 Myalgia: Secondary | ICD-10-CM | POA: Diagnosis not present

## 2016-02-05 DIAGNOSIS — N951 Menopausal and female climacteric states: Secondary | ICD-10-CM

## 2016-02-05 DIAGNOSIS — G620 Drug-induced polyneuropathy: Secondary | ICD-10-CM

## 2016-02-05 DIAGNOSIS — Z79811 Long term (current) use of aromatase inhibitors: Secondary | ICD-10-CM

## 2016-02-05 DIAGNOSIS — Z170 Estrogen receptor positive status [ER+]: Principal | ICD-10-CM

## 2016-02-05 DIAGNOSIS — C50212 Malignant neoplasm of upper-inner quadrant of left female breast: Secondary | ICD-10-CM | POA: Diagnosis not present

## 2016-02-05 NOTE — Progress Notes (Signed)
 Patient Care Team: Fozia M Khan, MD as PCP - General (Internal Medicine) Vinay Gudena, MD as Consulting Physician (Hematology and Oncology) Sarah Squire, MD as Attending Physician (Radiation Oncology) David Newman, MD as Consulting Physician (General Surgery)  DIAGNOSIS: Breast cancer of upper-inner quadrant of left female breast ([facility])   Staging form: Breast, AJCC 7th Edition   - Clinical: Stage IIB (T2, N1, cM0) - Signed by Sarah Squire, MD on 01/31/2014   - Pathologic: cM0 - Unsigned  SUMMARY OF ONCOLOGIC HISTORY:   Breast cancer of upper-inner quadrant of left female breast ([facility])   12/27/2013 Mammogram    Mammogram and ultrasound 1.8 cm mass left breast 3:00; 7 cm from nipple with abnormal 1 left axillary lymph node, 2.8 x 1.8 x 1.7 cm, second adjacent smaller nodule 1.2 x 1 x 0.7 cm      01/10/2014 Initial Diagnosis    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        1 AxLN biopsy: ER 70% PR 0% Ki-67 80% HER-2 +2.92, gene copy #5.5      01/19/2014 Breast MRI    Left breast: 17 mm mass, with non-mass enhancement both anterior and posterior to the biopsy clip extends 3.2 cm anterior and 2.8 Sinemet as medial to the mass, 1 cm satellite nodule, 2 abnormal lymph nodes left axilla 3.8 cm, 1.4 cm      02/09/2014 - 05/24/2014 Neo-Adjuvant Chemotherapy    Neoadjuvant chemotherapy with Taxotere, carboplatin, Herceptin and Perjeta x6 cycles      06/07/2014 Breast MRI    Left breast: Soft tissue signal 2.1 x 3.1 cm, resolution of enhancement in the lateral portion of the breast, interval decrease in size of left axillary lymph nodes, no suspicious lymph nodes remain      06/26/2014 Surgery    Left mastectomy: Pathologic complete response, 0/20 lymph nodes negative      08/16/2014 -  Anti-estrogen oral therapy    anastrozole 1 mg daily switched to letrozole 2.5 mg 08/06/15 (muscle aches)       CHIEF COMPLIANT: Follow-up on letrozole  INTERVAL HISTORY:  Regina Deleon is a 65-year-old with above-mentioned history of neoadjuvant chemotherapy followed by left mastectomy and is currently on oral antiestrogen therapy with letrozole. She tells me that she is tolerating letrozole much better than anastrozole. She continues to have intermittent hot flashes and muscle aches and pains. But these are manageable. She also continues to have neuropathy for which she is quite frustrated but is able to manage reasonably well. She is now taking turmeric and vitamin D supplementation and she thinks that her energy levels have improved.  REVIEW OF SYSTEMS:   Constitutional: Denies fevers, chills or abnormal weight loss Eyes: Denies blurriness of vision Ears, nose, mouth, throat, and face: Denies mucositis or sore throat Respiratory: Denies cough, dyspnea or wheezes Cardiovascular: Denies palpitation, chest discomfort Gastrointestinal:  Denies nausea, heartburn or change in bowel habits Skin: Denies abnormal skin rashes Lymphatics: Denies new lymphadenopathy or easy bruising Neurological:Denies numbness, tingling or new weaknesses Behavioral/Psych: Mood is stable, no new changes  Extremities: No lower extremity edema Breast:  denies any pain or lumps or nodules in either breasts All other systems were reviewed with the patient and are negative.  I have reviewed the past medical history, past surgical history, social history and family history with the patient and they are unchanged from previous note.  ALLERGIES:  is allergic to darvocet [propoxyphene   n-acetaminophen]; latex; enalapril; and penicillins.  MEDICATIONS:  Current Outpatient Prescriptions  Medication Sig Dispense Refill  . acetaminophen (TYLENOL) 500 MG tablet Take 500 mg by mouth every 6 (six) hours as needed for mild pain.    Marland Kitchen aspirin 81 MG tablet Take 81 mg by mouth every morning.     . cyclobenzaprine (FLEXERIL) 10 MG tablet     . fluticasone (FLONASE) 50 MCG/ACT nasal spray Place 2  sprays into both nostrils daily as needed for allergies or rhinitis.    Marland Kitchen gabapentin (NEURONTIN) 300 MG capsule TAKE 1 CAPSULE (300 MG TOTAL) BY MOUTH 2 (TWO) TIMES DAILY. 60 capsule 1  . letrozole (FEMARA) 2.5 MG tablet Take 1 tablet (2.5 mg total) by mouth daily. 90 tablet 3  . NIFEdipine (PROCARDIA-XL/ADALAT-CC/NIFEDICAL-XL) 30 MG 24 hr tablet Take 30 mg by mouth 2 (two) times daily.     . Omega-3 Fatty Acids (OMEGA 3 PO) Take 2 tablets by mouth every morning.    . ranitidine (ZANTAC) 150 MG tablet Take 150 mg by mouth 2 (two) times daily.    Marland Kitchen triamterene-hydrochlorothiazide (MAXZIDE-25) 37.5-25 MG per tablet Take 0.5 tablets by mouth every morning. As needed.    . zolpidem (AMBIEN) 10 MG tablet Take 10 mg by mouth at bedtime as needed for sleep.     No current facility-administered medications for this visit.     PHYSICAL EXAMINATION: ECOG PERFORMANCE STATUS: 1 - Symptomatic but completely ambulatory  There were no vitals filed for this visit. There were no vitals filed for this visit.  GENERAL:alert, no distress and comfortable SKIN: skin color, texture, turgor are normal, no rashes or significant lesions EYES: normal, Conjunctiva are pink and non-injected, sclera clear OROPHARYNX:no exudate, no erythema and lips, buccal mucosa, and tongue normal  NECK: supple, thyroid normal size, non-tender, without nodularity LYMPH:  no palpable lymphadenopathy in the cervical, axillary or inguinal LUNGS: clear to auscultation and percussion with normal breathing effort HEART: regular rate & rhythm and no murmurs and no lower extremity edema ABDOMEN:abdomen soft, non-tender and normal bowel sounds MUSCULOSKELETAL:no cyanosis of digits and no clubbing  NEURO: alert & oriented x 3 with fluent speech, no focal motor/sensory deficits EXTREMITIES: No lower extremity edema BREAST: No palpable masses or nodules in either right or left breasts. No palpable axillary supraclavicular or infraclavicular  adenopathy no breast tenderness or nipple discharge. (exam performed in the presence of a chaperone)  LABORATORY DATA:  I have reviewed the data as listed   Chemistry      Component Value Date/Time   NA 141 01/31/2015 0837   K 3.4 (L) 01/31/2015 0837   CL 105 08/16/2014 0921   CO2 26 01/31/2015 0837   BUN 11.4 01/31/2015 0837   CREATININE 0.9 01/31/2015 0837      Component Value Date/Time   CALCIUM 9.8 01/31/2015 0837   ALKPHOS 106 01/31/2015 0837   AST 23 01/31/2015 0837   ALT 23 01/31/2015 0837   BILITOT <0.30 01/31/2015 0837       Lab Results  Component Value Date   WBC 5.4 01/31/2015   HGB 11.2 (L) 01/31/2015   HCT 34.7 (L) 01/31/2015   MCV 84.9 01/31/2015   PLT 241 01/31/2015   NEUTROABS 3.5 01/31/2015   ASSESSMENT & PLAN:  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 AxLN biopsy: ER 70% PR 0% Ki-67 80% HER-2 +2.92, gene copy #  5.5 T2, N1, M0 stage IIB  Status post neoadjuvant chemotherapy with TCH Perjeta from 02/09/2014- 05/24/2014 (last cycle was given without chemotherapy)  Left breast mastectomy 06/26/2014: Complete pathologic response, 20 lymph nodes negative  Herceptin maintenance every 3 weeks completed 01/31/2015  Current treatment: Started anastrozole 08/16/14 1 mg daily switch to letrozole 2.5 mg daily 08/06/2015 due to musculoskeletal aches and pains  Letrozole toxicities: 1. Musculoskeletal aches and pains much better than anastrozole 2. intermittent hot flashes  Neuropathy: Continue with Neurontin now taking it twice a day  Breast Cancer Surveillance: 1. Breast exam 02/05/16: Normal 2. Mammogram 01/28/2015 No abnormalities. Postsurgical changes. Breast Density Category B. I recommended that she get 3-D mammograms for surveillance. Discussed the differences between different breast density categories.  Survivorship: I discussed with her the  importance of losing weight and exercise every day. She is motivated to walk every day and lose the weight.  Return to clinic in 6 months for follow-up   No orders of the defined types were placed in this encounter.  The patient has a good understanding of the overall plan. she agrees with it. she will call with any problems that may develop before the next visit here.   Rulon Eisenmenger, MD 02/05/16

## 2016-02-05 NOTE — Telephone Encounter (Signed)
GAVE PATIENT AVS REPORT AND APPOINTMENTS FOR April 2018.

## 2016-02-21 ENCOUNTER — Telehealth: Payer: Self-pay | Admitting: *Deleted

## 2016-02-21 NOTE — Telephone Encounter (Signed)
I returned call to patient re: hot flashes.  Pt has been on letrozole for 1.5 yrs.  Pt reports hot flashes have increased to several an hour in last 3-4 days.  Pt reports she is taking letrozole in am.    I advised pt to try taking letrozole at night.  If that does not help, pt is to call clinic next week and let us know.  Pt voiced understanding.

## 2016-02-21 NOTE — Telephone Encounter (Signed)
Patient called this am to report that she has been experiencing severe hot flashes since she started on this new bottle of letrozole.  She reports having them every hour.  She recently saw Dr. Lindi Adie, however they are much worse now.   Patient call back is [phone]

## 2016-03-23 ENCOUNTER — Other Ambulatory Visit: Payer: Self-pay

## 2016-03-23 MED ORDER — TRIAMCINOLONE ACETONIDE 0.5 % EX OINT: 1.0000 "application " | TOPICAL_OINTMENT | Freq: Two times a day (BID) | CUTANEOUS | 0 refills | Status: DC

## 2016-03-23 NOTE — Progress Notes (Signed)
Pt called to request refill of her trimacinolone cream. Pt states that she ran out a few days ago and the skin burning feeling and vaginal irritation is coming back. She states that she has been taking the cream since she started letrozole and has worked well. Told pt that will fill it today and she can pick up at her local pharmacy. Pt verbalized understanding and will pick up prescription later today.

## 2016-04-09 DIAGNOSIS — C50412 Malignant neoplasm of upper-outer quadrant of left female breast: Secondary | ICD-10-CM | POA: Diagnosis not present

## 2016-04-09 DIAGNOSIS — Z9884 Bariatric surgery status: Secondary | ICD-10-CM | POA: Diagnosis not present

## 2016-04-13 DIAGNOSIS — M545 Low back pain: Secondary | ICD-10-CM | POA: Diagnosis not present

## 2016-04-13 DIAGNOSIS — I10 Essential (primary) hypertension: Secondary | ICD-10-CM | POA: Diagnosis not present

## 2016-04-13 DIAGNOSIS — F5102 Adjustment insomnia: Secondary | ICD-10-CM | POA: Diagnosis not present

## 2016-04-13 DIAGNOSIS — C50519 Malignant neoplasm of lower-outer quadrant of unspecified female breast: Secondary | ICD-10-CM | POA: Diagnosis not present

## 2016-04-13 DIAGNOSIS — J019 Acute sinusitis, unspecified: Secondary | ICD-10-CM | POA: Diagnosis not present

## 2016-04-30 ENCOUNTER — Other Ambulatory Visit: Payer: Self-pay | Admitting: Nurse Practitioner

## 2016-05-25 DIAGNOSIS — J069 Acute upper respiratory infection, unspecified: Secondary | ICD-10-CM | POA: Diagnosis not present

## 2016-05-25 DIAGNOSIS — J029 Acute pharyngitis, unspecified: Secondary | ICD-10-CM | POA: Diagnosis not present

## 2016-08-04 ENCOUNTER — Other Ambulatory Visit: Payer: Self-pay

## 2016-08-04 DIAGNOSIS — C50212 Malignant neoplasm of upper-inner quadrant of left female breast: Secondary | ICD-10-CM

## 2016-08-04 MED ORDER — LETROZOLE 2.5 MG PO TABS: 2.5000 mg | ORAL_TABLET | Freq: Every day | ORAL | 3 refills | Status: DC

## 2016-08-10 ENCOUNTER — Ambulatory Visit: Payer: PPO | Admitting: Hematology and Oncology

## 2016-08-10 NOTE — Assessment & Plan Note (Deleted)
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 AxLN biopsy: ER 70% PR 0% Ki-67 80% HER-2 +2.92, gene copy #5.5 T2, N1, M0 stage IIB  Status post neoadjuvant chemotherapy with TCH Perjeta from 02/09/2014- 05/24/2014 (last cycle was given without chemotherapy)  Left breast mastectomy 06/26/2014: Complete pathologic response, 20 lymph nodes negative  Herceptin maintenance every 3 weeks completed 01/31/2015  Current treatment: Started anastrozole 08/16/14 1 mg daily switch to letrozole 2.5 mg daily 08/06/2015 due to musculoskeletal aches and pains  Letrozole toxicities: 1. Musculoskeletal aches and pains much better than anastrozole 2. intermittent hot flashes  Neuropathy:Continue with Neurontin now taking it twice a day  Breast Cancer Surveillance: 1. Breast exam 08/10/2016: Left mastectomy 2. right breast Mammogram 01/31/2016 No abnormalities. Postsurgical changes. Breast Density Category B. I recommended that she get 3-D mammograms for surveillance. Discussed the differences between different breast density categories.  Return to clinic in 1 year for follow-up

## 2016-09-08 DIAGNOSIS — R51 Headache: Secondary | ICD-10-CM | POA: Diagnosis not present

## 2016-09-08 DIAGNOSIS — J019 Acute sinusitis, unspecified: Secondary | ICD-10-CM | POA: Diagnosis not present

## 2016-09-08 DIAGNOSIS — F5102 Adjustment insomnia: Secondary | ICD-10-CM | POA: Diagnosis not present

## 2016-09-08 DIAGNOSIS — I10 Essential (primary) hypertension: Secondary | ICD-10-CM | POA: Diagnosis not present

## 2016-10-30 DIAGNOSIS — Z9884 Bariatric surgery status: Secondary | ICD-10-CM | POA: Diagnosis not present

## 2016-10-30 DIAGNOSIS — C50412 Malignant neoplasm of upper-outer quadrant of left female breast: Secondary | ICD-10-CM | POA: Diagnosis not present

## 2016-12-15 DIAGNOSIS — S80869A Insect bite (nonvenomous), unspecified lower leg, initial encounter: Secondary | ICD-10-CM | POA: Diagnosis not present

## 2016-12-15 DIAGNOSIS — I10 Essential (primary) hypertension: Secondary | ICD-10-CM | POA: Diagnosis not present

## 2016-12-15 DIAGNOSIS — R21 Rash and other nonspecific skin eruption: Secondary | ICD-10-CM | POA: Diagnosis not present

## 2016-12-22 ENCOUNTER — Other Ambulatory Visit: Payer: Self-pay | Admitting: Hematology and Oncology

## 2016-12-22 DIAGNOSIS — Z1231 Encounter for screening mammogram for malignant neoplasm of breast: Secondary | ICD-10-CM

## 2017-02-01 ENCOUNTER — Ambulatory Visit
Admission: RE | Admit: 2017-02-01 | Discharge: 2017-02-01 | Disposition: A | Discharge status: Home / Self Care | Payer: PPO | Source: Ambulatory Visit | Attending: Hematology and Oncology | Admitting: Hematology and Oncology

## 2017-02-01 DIAGNOSIS — Z1231 Encounter for screening mammogram for malignant neoplasm of breast: Secondary | ICD-10-CM | POA: Diagnosis not present

## 2017-03-09 ENCOUNTER — Telehealth: Payer: Self-pay

## 2017-03-09 NOTE — Telephone Encounter (Signed)
Left VM with pt regarding her letrozole. Informed her that Dr. Lindi Adie agreed that she could stop taking the medication. Told her to give [place] a call back with any other questions or concerns. Cyndia Bent RN

## 2017-03-09 NOTE — Telephone Encounter (Signed)
Pt has been taking letrozole. She had a negative mammogram. She has been taking 2 yr 9 month. She is wanting to stop taking it. Her legs are hurting very bad and keep her up at night. Her gait is getting unsteady.  Her left hand is cramping and she cannot make a fist, she is having problems opening things. Left hand is painful as well. She has been working her hand and trying to walk a treadmill 30 minutes.   She has used tylenol, ibuprofen, ASA. Heat on legs at night and compression stockings help a little bit.   She has no future appointment.

## 2017-03-09 NOTE — Telephone Encounter (Signed)
Pt called to verify she heard voice mail correctly. Reread message to her.

## 2017-05-28 ENCOUNTER — Encounter: Payer: Self-pay | Admitting: Nurse Practitioner

## 2017-05-28 ENCOUNTER — Ambulatory Visit (INDEPENDENT_AMBULATORY_CARE_PROVIDER_SITE_OTHER): Payer: PPO | Admitting: Nurse Practitioner

## 2017-05-28 VITALS — BP 151/90 | HR 90 | Temp 97.4°F | Resp 16 | Ht 63.0 in | Wt 204.0 lb

## 2017-05-28 DIAGNOSIS — G620 Drug-induced polyneuropathy: Secondary | ICD-10-CM | POA: Diagnosis not present

## 2017-05-28 DIAGNOSIS — J0140 Acute pansinusitis, unspecified: Secondary | ICD-10-CM | POA: Diagnosis not present

## 2017-05-28 DIAGNOSIS — I10 Essential (primary) hypertension: Secondary | ICD-10-CM

## 2017-05-28 DIAGNOSIS — F5101 Primary insomnia: Secondary | ICD-10-CM

## 2017-05-28 DIAGNOSIS — M170 Bilateral primary osteoarthritis of knee: Secondary | ICD-10-CM

## 2017-05-28 DIAGNOSIS — T451X5A Adverse effect of antineoplastic and immunosuppressive drugs, initial encounter: Secondary | ICD-10-CM

## 2017-05-28 MED ORDER — TRIAMTERENE-HCTZ 37.5-25 MG PO TABS: 1.0000 | ORAL_TABLET | Freq: Every day | ORAL | 5 refills | Status: DC

## 2017-05-28 MED ORDER — GABAPENTIN 100 MG PO CAPS: 100.0000 mg | ORAL_CAPSULE | Freq: Every evening | ORAL | 3 refills | Status: DC

## 2017-05-28 MED ORDER — CLARITHROMYCIN 500 MG PO TABS: 500.0000 mg | ORAL_TABLET | Freq: Two times a day (BID) | ORAL | 0 refills | Status: DC

## 2017-05-28 MED ORDER — ZOLPIDEM TARTRATE 10 MG PO TABS: 10.0000 mg | ORAL_TABLET | Freq: Every evening | ORAL | 3 refills | Status: DC | PRN

## 2017-05-28 MED ORDER — DICLOFENAC SODIUM 1 % TD GEL: 4.0000 g | Freq: Four times a day (QID) | TRANSDERMAL | 5 refills | Status: DC

## 2017-05-28 NOTE — Progress Notes (Addendum)
[facility] [place], [place] [address]  Internal MEDICINE  Office Visit Note  Patient Name: Regina Deleon  [id number]  [id number]  Date of Service: 05/28/2017  Chief Complaint  Patient presents with  . Generalized Body Aches    body aches  . Knee Pain  . Leg Pain  . Edema    left side face, feels like a toothache  . Otalgia  . Sinusitis  . feels like something is "running" above her right ankle  . Fatigue    feels very tired all the time    The patient is here for acute visit. Blood pressure is a bit elevated. She has joint pain in both knees. Which radiates down both legs. No swelling in lower legs or ankles. Has had issue off and on since she had chemotherapy 4 years.     Sinusitis  This is a new problem. The current episode started in the past 7 days. The problem is unchanged. There has been no fever. The fever has been present for 5 days or more. Associated symptoms include congestion, ear pain, headaches and sinus pressure. Pertinent negatives include no shortness of breath. The treatment provided no relief.    Pt is here for routine follow up.    Current Medication: Outpatient Encounter Medications as of 05/28/2017  Medication Sig Note  . acetaminophen (TYLENOL) 500 MG tablet Take 500 mg by mouth every 6 (six) hours as needed for mild pain.   Marland Kitchen aspirin 81 MG tablet Take 81 mg by mouth every morning.    . cyclobenzaprine (FLEXERIL) 10 MG tablet  02/07/2015: Has Rx but never taken   . fluticasone (FLONASE) 50 MCG/ACT nasal spray Place 2 sprays into both nostrils daily as needed for allergies or rhinitis.   Marland Kitchen NIFEdipine (PROCARDIA-XL/ADALAT-CC/NIFEDICAL-XL) 30 MG 24 hr tablet Take 30 mg by mouth 2 (two) times daily.    . Omega-3 Fatty Acids (OMEGA 3 PO) Take 2 tablets by mouth every morning.   . ranitidine (ZANTAC) 150 MG tablet Take 150 mg by mouth 2 (two) times daily.   Marland Kitchen triamterene-hydrochlorothiazide (MAXZIDE-25) 37.5-25 MG per tablet  Take 0.5 tablets by mouth every morning. As needed.   . zolpidem (AMBIEN) 10 MG tablet Take 10 mg by mouth at bedtime as needed for sleep.   Marland Kitchen gabapentin (NEURONTIN) 300 MG capsule TAKE 1 CAPSULE (300 MG TOTAL) BY MOUTH 2 (TWO) TIMES DAILY. (Patient not taking: Reported on 05/28/2017)   . letrozole (FEMARA) 2.5 MG tablet Take 1 tablet (2.5 mg total) by mouth daily. (Patient not taking: Reported on 05/28/2017)   . triamcinolone ointment (KENALOG) 0.5 % Apply 1 application topically 2 (two) times daily. (Patient not taking: Reported on 05/28/2017)    No facility-administered encounter medications on file as of 05/28/2017.     Surgical History: Past Surgical History:  Procedure Laterality Date  . ABDOMINAL HYSTERECTOMY  1986  . AXILLARY LYMPH NODE DISSECTION Left 06/26/2014   Procedure: AXILLARY LYMPH NODE DISSECTION;  Surgeon: Alphonsa Overall, MD;  Location: [facility];  Service: General;  Laterality: Left;  . [place]   lumbar  . COLONOSCOPY    . DILATION AND CURETTAGE OF UTERUS    . LAPAROSCOPIC GASTRIC BANDING  10/19/05  . MASTECTOMY Left   . MASTECTOMY MODIFIED RADICAL Left 06/26/2014   Procedure: MASTECTOMY MODIFIED RADICAL;  Surgeon: Alphonsa Overall, MD;  Location: [facility];  Service: General;  Laterality: Left;  . PORT A  CATH REVISION     insertion-10/15  . PORT-A-CATH REMOVAL N/A 02/07/2015   Procedure: MINOR REMOVAL PORT-A-CATH;  Surgeon: Alphonsa Overall, MD;  Location: [facility];  Service: General;  Laterality: N/A;    Medical History: Past Medical History:  Diagnosis Date  . Cancer ([place])    lt br cancer  . Edema leg    with chemo  . GERD (gastroesophageal reflux disease)   . Hyperlipidemia   . Hypertension   . Neuropathy    hands and feet post chemo  . Wears glasses     Family History: Family History  Problem Relation Age of Onset  . Asthma Mother   . Diabetes Mother   . Diabetes Father   . Heart disease Father    . Cancer Maternal Aunt   . Cancer Cousin   . Cancer Cousin   . Cancer Cousin     Social History   Socioeconomic History  . Marital status: Married    Spouse name: Not on file  . Number of children: Not on file  . Years of education: Not on file  . Highest education level: Not on file  Social Needs  . Financial resource strain: Not on file  . Food insecurity - worry: Not on file  . Food insecurity - inability: Not on file  . Transportation needs - medical: Not on file  . Transportation needs - non-medical: Not on file  Occupational History  . Not on file  Tobacco Use  . Smoking status: Never Smoker  . Smokeless tobacco: Never Used  Substance and Sexual Activity  . Alcohol use: No  . Drug use: No  . Sexual activity: Yes  Other Topics Concern  . Not on file  Social History Narrative  . Not on file      Review of Systems  Constitutional: Positive for fatigue. Negative for activity change and fever.  HENT: Positive for congestion, ear pain and sinus pressure. Negative for rhinorrhea.   Respiratory: Negative for chest tightness, shortness of breath and wheezing.   Cardiovascular:       Blood pressure elevated today.   Gastrointestinal: Negative for abdominal pain, constipation, diarrhea and vomiting.  Endocrine: Negative for cold intolerance, heat intolerance, polydipsia, polyphagia and polyuria.  Genitourinary: Negative.   Musculoskeletal: Positive for arthralgias and joint swelling.  Skin: Negative.   Allergic/Immunologic: Positive for environmental allergies.  Neurological: Positive for headaches.       Legs cramping and causing her to stay awake at night.   Hematological: Negative for adenopathy. Does not bruise/bleed easily.  Psychiatric/Behavioral: Positive for sleep disturbance. Negative for behavioral problems and decreased concentration.   Today's Vitals   05/28/17 1430  BP: (!) 151/90  Pulse: 90  Resp: 16  Temp: (!) 97.4 F (36.3 C)  SpO2: 96%   Weight: 204 lb (92.5 kg)  Height: 5\' 3"  (1.6 m)    Physical Exam  Constitutional: She is oriented to person, place, and time. She appears well-developed and well-nourished. No distress.  HENT:  Head: Normocephalic and atraumatic.  Right Ear: Tympanic membrane is erythematous and bulging.  Left Ear: Tympanic membrane is erythematous and bulging.  Nose: Rhinorrhea present. Right sinus exhibits maxillary sinus tenderness and frontal sinus tenderness. Left sinus exhibits maxillary sinus tenderness and frontal sinus tenderness.  Mouth/Throat: Posterior oropharyngeal erythema present. No oropharyngeal exudate.  Eyes: EOM are normal. Pupils are equal, round, and reactive to light.  Neck: Normal range of motion. Neck supple. No JVD present. No tracheal  deviation present. No thyromegaly present.  Cardiovascular: Normal rate, regular rhythm and normal heart sounds. Exam reveals no gallop and no friction rub.  No murmur heard. Pulmonary/Chest: Effort normal and breath sounds normal. No respiratory distress. She has no wheezes. She has no rales. She exhibits no tenderness.  Abdominal: Soft. Bowel sounds are normal. There is no tenderness.  Musculoskeletal: Normal range of motion.  Lymphadenopathy:    She has cervical adenopathy.  Neurological: She is alert and oriented to person, place, and time. No cranial nerve deficit.  Skin: Skin is warm and dry. She is not diaphoretic.  Psychiatric: She has a normal mood and affect. Her behavior is normal. Judgment and thought content normal.  Nursing note and vitals reviewed.  Assessment/Plan: 1. Acute non-recurrent pansinusitis - clarithromycin (BIAXIN) 500 MG tablet; Take 1 tablet (500 mg total) by mouth 2 (two) times daily.  Dispense: 20 tablet; Refill: 0 Continue to take OTC medications to relieve acute symptoms.  2. Primary osteoarthritis of knees, bilateral - diclofenac sodium (VOLTAREN) 1 % GEL; Apply 4 g topically 4 (four) times daily.  Dispense:  100 g; Refill: 5  3. Essential hypertension Stable when taking medications. Renew meds - triamterene-hydrochlorothiazide (MAXZIDE-25) 37.5-25 MG tablet; Take 1 tablet by mouth daily.  Dispense: 30 tablet; Refill: 5  4. Neuropathy due to chemotherapeutic drug (HCC) - gabapentin (NEURONTIN) 100 MG capsule; Take 1 capsule (100 mg total) by mouth every evening.  Dispense: 30 capsule; Refill: 3  5. Primary insomnia - zolpidem (AMBIEN) 10 MG tablet; Take 1 tablet (10 mg total) by mouth at bedtime as needed for sleep.  Dispense: 30 tablet; Refill: 3   General Counseling: Kamyiah verbalizes understanding of the findings of todays visit and agrees with plan of treatment. I have discussed any further diagnostic evaluation that may be needed or ordered today. We also reviewed her medications today. she has been encouraged to call the office with any questions or concerns that should arise related to todays visit.   Hypertension Counseling:   The following hypertensive lifestyle modification were recommended and discussed:  1. Limiting alcohol intake to less than 1 oz/day of ethanol:(24 oz of beer or 8 oz of wine or 2 oz of 100-proof whiskey). 2. Take baby ASA 81 mg daily. 3. Importance of regular aerobic exercise and losing weight. 4. Reduce dietary saturated fat and cholesterol intake for overall cardiovascular health. 5. Maintaining adequate dietary potassium, calcium, and magnesium intake. 6. Regular monitoring of the blood pressure. 7. Reduce sodium intake to less than 100 mmol/day (less than 2.3 gm of sodium or less than 6 gm of sodium choride)   This patient was seen by Leretha Pol, FNP- C in Collaboration with Dr Lavera Guise as a part of collaborative care agreement     Time spent: 12 Minutes    Dr Lavera Guise Internal medicine

## 2017-06-03 DIAGNOSIS — Z9884 Bariatric surgery status: Secondary | ICD-10-CM | POA: Diagnosis not present

## 2017-06-03 DIAGNOSIS — C50412 Malignant neoplasm of upper-outer quadrant of left female breast: Secondary | ICD-10-CM | POA: Diagnosis not present

## 2017-06-18 DIAGNOSIS — C50912 Malignant neoplasm of unspecified site of left female breast: Secondary | ICD-10-CM | POA: Diagnosis not present

## 2017-07-19 ENCOUNTER — Other Ambulatory Visit: Payer: Self-pay

## 2017-07-19 DIAGNOSIS — M170 Bilateral primary osteoarthritis of knee: Secondary | ICD-10-CM

## 2017-07-19 MED ORDER — DICLOFENAC SODIUM 1 % TD GEL: 4.0000 g | Freq: Four times a day (QID) | TRANSDERMAL | 5 refills | Status: DC

## 2017-07-23 ENCOUNTER — Other Ambulatory Visit: Payer: Self-pay | Admitting: Internal Medicine

## 2017-08-18 ENCOUNTER — Other Ambulatory Visit: Payer: Self-pay | Admitting: Internal Medicine

## 2017-08-18 DIAGNOSIS — G620 Drug-induced polyneuropathy: Secondary | ICD-10-CM

## 2017-08-18 DIAGNOSIS — T451X5A Adverse effect of antineoplastic and immunosuppressive drugs, initial encounter: Principal | ICD-10-CM

## 2017-08-18 MED ORDER — GABAPENTIN 100 MG PO CAPS: 100.0000 mg | ORAL_CAPSULE | Freq: Every evening | ORAL | 3 refills | Status: DC

## 2017-08-29 ENCOUNTER — Other Ambulatory Visit: Payer: Self-pay | Admitting: Hematology and Oncology

## 2017-08-29 DIAGNOSIS — C50212 Malignant neoplasm of upper-inner quadrant of left female breast: Secondary | ICD-10-CM

## 2017-09-02 ENCOUNTER — Encounter: Payer: Self-pay | Admitting: Nurse Practitioner

## 2017-10-07 DIAGNOSIS — R300 Dysuria: Secondary | ICD-10-CM | POA: Diagnosis not present

## 2017-10-14 ENCOUNTER — Encounter: Payer: Self-pay | Admitting: Nurse Practitioner

## 2017-10-14 ENCOUNTER — Ambulatory Visit (INDEPENDENT_AMBULATORY_CARE_PROVIDER_SITE_OTHER): Payer: PPO | Admitting: Nurse Practitioner

## 2017-10-14 VITALS — BP 152/90 | HR 92 | Resp 16 | Ht 63.0 in | Wt 207.0 lb

## 2017-10-14 DIAGNOSIS — M170 Bilateral primary osteoarthritis of knee: Secondary | ICD-10-CM

## 2017-10-14 DIAGNOSIS — Z1211 Encounter for screening for malignant neoplasm of colon: Secondary | ICD-10-CM

## 2017-10-14 DIAGNOSIS — I10 Essential (primary) hypertension: Secondary | ICD-10-CM | POA: Diagnosis not present

## 2017-10-14 DIAGNOSIS — R300 Dysuria: Secondary | ICD-10-CM | POA: Diagnosis not present

## 2017-10-14 DIAGNOSIS — K1230 Oral mucositis (ulcerative), unspecified: Secondary | ICD-10-CM | POA: Diagnosis not present

## 2017-10-14 DIAGNOSIS — N390 Urinary tract infection, site not specified: Secondary | ICD-10-CM

## 2017-10-14 DIAGNOSIS — K121 Other forms of stomatitis: Secondary | ICD-10-CM | POA: Diagnosis not present

## 2017-10-14 DIAGNOSIS — N3000 Acute cystitis without hematuria: Secondary | ICD-10-CM

## 2017-10-14 DIAGNOSIS — F5101 Primary insomnia: Secondary | ICD-10-CM

## 2017-10-14 DIAGNOSIS — Z0001 Encounter for general adult medical examination with abnormal findings: Secondary | ICD-10-CM | POA: Diagnosis not present

## 2017-10-14 LAB — POCT URINALYSIS DIPSTICK
Bilirubin, UA: NEGATIVE
Glucose, UA: NEGATIVE
KETONES UA: NEGATIVE
Nitrite, UA: NEGATIVE
PH UA: 7.5 (ref 5.0–8.0)
Protein, UA: NEGATIVE
RBC UA: NEGATIVE
Spec Grav, UA: >=1.03 — AB (ref 1.010–1.025)
UROBILINOGEN UA: 0.2 U/dL

## 2017-10-14 MED ORDER — CHLORHEXIDINE GLUCONATE 0.12 % MT SOLN: 10.0000 mL | Freq: Two times a day (BID) | OROMUCOSAL | 1 refills | Status: DC

## 2017-10-14 MED ORDER — SULFAMETHOXAZOLE-TRIMETHOPRIM 800-160 MG PO TABS: 1.0000 | ORAL_TABLET | Freq: Two times a day (BID) | ORAL | 0 refills | Status: DC

## 2017-10-14 MED ORDER — ZOLPIDEM TARTRATE 10 MG PO TABS
10.0000 mg | ORAL_TABLET | Freq: Every evening | ORAL | 3 refills | Status: DC | PRN
Start: 2017-10-14 — End: 2018-03-10

## 2017-10-14 MED ORDER — DICLOFENAC SODIUM 1 % TD GEL: 4.0000 g | Freq: Four times a day (QID) | TRANSDERMAL | 5 refills | Status: DC

## 2017-10-14 NOTE — Progress Notes (Signed)
[facility] [place],  [address]  Internal MEDICINE  Office Visit Note  Patient Name: Regina Deleon  [id number]  [id number]  Date of Service: 11/03/2017   Today's Vitals   10/14/17 1525 10/14/17 1603  BP: (!) 162/87 (!) 152/90  Pulse: 92   Resp: 16   SpO2: 97%   Weight: 207 lb (93.9 kg)   Height: 5\' 3"  (1.6 m)     Chief Complaint  Patient presents with  . Annual Exam  . Urinary Tract Infection     The patient states that she is having pain in her mouth. Her gumsare sore and red. No lesions present. Unsure what is going on.   Urinary Tract Infection   This is a new problem. The current episode started in the past 7 days. The problem occurs intermittently. The problem has been unchanged. The patient is experiencing no pain. She is not sexually active. There is no history of pyelonephritis. Associated symptoms include a discharge and frequency. Pertinent negatives include no vomiting. She has tried nothing for the symptoms.     Current Medication: Outpatient Encounter Medications as of 10/14/2017  Medication Sig Note  . acetaminophen (TYLENOL) 500 MG tablet Take 500 mg by mouth every 6 (six) hours as needed for mild pain.   Marland Kitchen aspirin 81 MG tablet Take 81 mg by mouth every morning.    . cyclobenzaprine (FLEXERIL) 10 MG tablet TAKE 1 TABLET BY MOUTH EVERY DAY AT BEDTIME AS NEEDED FOR MUSCLE SPASM   . diclofenac sodium (VOLTAREN) 1 % GEL Apply 4 g topically 4 (four) times daily.   . fluticasone (FLONASE) 50 MCG/ACT nasal spray Place 2 sprays into both nostrils daily as needed for allergies or rhinitis.   Marland Kitchen gabapentin (NEURONTIN) 100 MG capsule Take 1 capsule (100 mg total) by mouth every evening.   Marland Kitchen NIFEdipine (PROCARDIA-XL/ADALAT-CC/NIFEDICAL-XL) 30 MG 24 hr tablet Take 30 mg by mouth 2 (two) times daily.    . Omega-3 Fatty Acids (OMEGA 3 PO) Take 2 tablets by mouth every morning.   . ranitidine (ZANTAC) 150 MG tablet Take 150 mg by mouth 2  (two) times daily.   Marland Kitchen triamcinolone ointment (KENALOG) 0.5 % Apply 1 application topically 2 (two) times daily.   Marland Kitchen triamterene-hydrochlorothiazide (MAXZIDE-25) 37.5-25 MG tablet Take 1 tablet by mouth daily.   Marland Kitchen zolpidem (AMBIEN) 10 MG tablet Take 1 tablet (10 mg total) by mouth at bedtime as needed for sleep.   . [DISCONTINUED] clarithromycin (BIAXIN) 500 MG tablet Take 1 tablet (500 mg total) by mouth 2 (two) times daily.   . [DISCONTINUED] cyclobenzaprine (FLEXERIL) 10 MG tablet  02/07/2015: Has Rx but never taken   . [DISCONTINUED] diclofenac sodium (VOLTAREN) 1 % GEL Apply 4 g topically 4 (four) times daily.   . [DISCONTINUED] zolpidem (AMBIEN) 10 MG tablet Take 1 tablet (10 mg total) by mouth at bedtime as needed for sleep.   . chlorhexidine (PERIDEX) 0.12 % solution Use as directed 10 mLs in the mouth or throat 2 (two) times daily.   Marland Kitchen letrozole (FEMARA) 2.5 MG tablet Take 1 tablet (2.5 mg total) by mouth daily. (Patient not taking: Reported on 10/14/2017)   . sulfamethoxazole-trimethoprim (BACTRIM DS,SEPTRA DS) 800-160 MG tablet Take 1 tablet by mouth 2 (two) times daily.    No facility-administered encounter medications on file as of 10/14/2017.     Surgical History: Past Surgical History:  Procedure Laterality Date  . ABDOMINAL HYSTERECTOMY  1986  . AXILLARY LYMPH  NODE DISSECTION Left 06/26/2014   Procedure: AXILLARY LYMPH NODE DISSECTION;  Surgeon: Alphonsa Overall, MD;  Location: [facility];  Service: General;  Laterality: Left;  . [place]   lumbar  . COLONOSCOPY    . DILATION AND CURETTAGE OF UTERUS    . LAPAROSCOPIC GASTRIC BANDING  10/19/05  . MASTECTOMY Left   . MASTECTOMY MODIFIED RADICAL Left 06/26/2014   Procedure: MASTECTOMY MODIFIED RADICAL;  Surgeon: Alphonsa Overall, MD;  Location: [facility];  Service: General;  Laterality: Left;  . PORT A CATH REVISION     insertion-10/15  . PORT-A-CATH REMOVAL N/A 02/07/2015   Procedure:  MINOR REMOVAL PORT-A-CATH;  Surgeon: Alphonsa Overall, MD;  Location: [facility];  Service: General;  Laterality: N/A;    Medical History: Past Medical History:  Diagnosis Date  . Cancer ([place])    lt br cancer  . Edema leg    with chemo  . GERD (gastroesophageal reflux disease)   . Hyperlipidemia   . Hypertension   . Neuropathy    hands and feet post chemo  . Wears glasses     Family History: Family History  Problem Relation Age of Onset  . Asthma Mother   . Diabetes Mother   . Diabetes Father   . Heart disease Father   . Cancer Maternal Aunt   . Cancer Cousin   . Cancer Cousin   . Cancer Cousin       Review of Systems  Constitutional: Positive for fatigue. Negative for activity change and fever.  HENT: Positive for congestion. Negative for ear pain, rhinorrhea and sinus pressure.   Eyes: Negative.   Respiratory: Negative for chest tightness, shortness of breath and wheezing.   Cardiovascular: Negative for chest pain and palpitations.       Blood pressure elevated today.   Gastrointestinal: Negative for abdominal pain, constipation, diarrhea and vomiting.  Endocrine: Negative for cold intolerance, heat intolerance, polydipsia, polyphagia and polyuria.  Genitourinary: Positive for dysuria and frequency.  Musculoskeletal: Positive for arthralgias and joint swelling.  Skin: Negative for rash.  Allergic/Immunologic: Positive for environmental allergies.  Neurological: Positive for headaches.       Legs cramping and causing her to stay awake at night.   Hematological: Negative for adenopathy. Does not bruise/bleed easily.  Psychiatric/Behavioral: Positive for sleep disturbance. Negative for behavioral problems and decreased concentration.    Today's Vitals   10/14/17 1525 10/14/17 1603  BP: (!) 162/87 (!) 152/90  Pulse: 92   Resp: 16   SpO2: 97%   Weight: 207 lb (93.9 kg)   Height: 5\' 3"  (1.6 m)     Physical Exam  Constitutional: She is oriented to  person, place, and time. She appears well-developed and well-nourished. No distress.  HENT:  Head: Normocephalic and atraumatic.  Right Ear: Tympanic membrane is erythematous and bulging.  Left Ear: Tympanic membrane is erythematous and bulging.  Nose: Rhinorrhea present. Right sinus exhibits maxillary sinus tenderness and frontal sinus tenderness. Left sinus exhibits maxillary sinus tenderness and frontal sinus tenderness.  Mouth/Throat: Posterior oropharyngeal erythema present. No oropharyngeal exudate.  Eyes: Pupils are equal, round, and reactive to light. Conjunctivae and EOM are normal.  Neck: Normal range of motion. Neck supple. No JVD present. No tracheal deviation present. No thyromegaly present.  Cardiovascular: Normal rate, regular rhythm, normal heart sounds and intact distal pulses. Exam reveals no gallop and no friction rub.  No murmur heard. Pulmonary/Chest: Effort normal and breath sounds normal.  No respiratory distress. She has no wheezes. She has no rales. She exhibits no tenderness.  Abdominal: Soft. Bowel sounds are normal. There is no tenderness.  Genitourinary:  Genitourinary Comments: U/a positive for moderate WBC  Musculoskeletal: Normal range of motion.  Lymphadenopathy:    She has cervical adenopathy.  Neurological: She is alert and oriented to person, place, and time. No cranial nerve deficit.  Skin: Skin is warm and dry. Capillary refill takes less than 2 seconds. She is not diaphoretic.  Psychiatric: She has a normal mood and affect. Her behavior is normal. Judgment and thought content normal.  Nursing note and vitals reviewed.    LABS: Recent Results (from the past 2160 hour(s))  CULTURE, URINE COMPREHENSIVE     Status: None   Collection Time: 10/07/17  3:39 PM  Result Value Ref Range   Urine Culture, Comprehensive Final report    Organism ID, Bacteria Comment     Comment: Mixed urogenital flora 10,000-25,000 colony forming units per mL   POCT  Urinalysis Dipstick     Status: Abnormal   Collection Time: 10/14/17  3:43 PM  Result Value Ref Range   Color, UA     Clarity, UA     Glucose, UA Negative Negative   Bilirubin, UA negat    Ketones, UA negat    Spec Grav, UA >=1.030 (A) 1.010 - 1.025   Blood, UA negative    pH, UA 7.5 5.0 - 8.0   Protein, UA Negative Negative   Urobilinogen, UA 0.2 0.2 or 1.0 E.U./dL   Nitrite, UA negative    Leukocytes, UA Moderate (2+) (A) Negative   Appearance     Odor    POCT Urinalysis Dipstick     Status: Abnormal   Collection Time: 11/02/17 11:16 AM  Result Value Ref Range   Color, UA     Clarity, UA     Glucose, UA Negative Negative   Bilirubin, UA negative    Ketones, UA negative    Spec Grav, UA 1.010 1.010 - 1.025   Blood, UA negative    pH, UA 7.0 5.0 - 8.0   Protein, UA Negative Negative   Urobilinogen, UA 0.2 0.2 or 1.0 E.U./dL   Nitrite, UA negative    Leukocytes, UA Large (3+) (A) Negative   Appearance     Odor      Assessment/Plan: 1. Encounter for general adult medical examination with abnormal findings Annual health maintenance exam today.   2. Acute cystitis without hematuria Bactrim DS bid for 10 days. Send for culture and sensitivity and adjust antibiotics as indicated.  - sulfamethoxazole-trimethoprim (BACTRIM DS,SEPTRA DS) 800-160 MG tablet; Take 1 tablet by mouth 2 (two) times daily.  Dispense: 20 tablet; Refill: 0  3. Urinary tract infection without hematuria, site unspecified - POCT Urinalysis Dipstick positive for moderate WBC  4. Dysuria - CULTURE, URINE COMPREHENSIVE  5. Essential hypertension Stable. Continue bp medication as prescribed.   6. Primary insomnia May continue [place] as needed and as prescribed.  - zolpidem (AMBIEN) 10 MG tablet; Take 1 tablet (10 mg total) by mouth at bedtime as needed for sleep.  Dispense: 30 tablet; Refill: 3  7. Primary osteoarthritis of knees, bilateral - diclofenac sodium (VOLTAREN) 1 % GEL; Apply 4 g topically  4 (four) times daily.  Dispense: 100 g; Refill: 5  8. Stomatitis and mucositis Swish and spit with chlorhexidine mouth wash. If no improvement, advised she make appointment with her dentist.  - chlorhexidine (PERIDEX) 0.12 %  solution; Use as directed 10 mLs in the mouth or throat 2 (two) times daily.  Dispense: 120 mL; Refill: 1  General Counseling: Dreya verbalizes understanding of the findings of todays visit and agrees with plan of treatment. I have discussed any further diagnostic evaluation that may be needed or ordered today. We also reviewed her medications today. she has been encouraged to call the office with any questions or concerns that should arise related to todays visit.    Counseling:    Orders Placed This Encounter  Procedures  . CULTURE, URINE COMPREHENSIVE  . POCT Urinalysis Dipstick    Meds ordered this encounter  Medications  . sulfamethoxazole-trimethoprim (BACTRIM DS,SEPTRA DS) 800-160 MG tablet    Sig: Take 1 tablet by mouth 2 (two) times daily.    Dispense:  20 tablet    Refill:  0    Order Specific Question:   Supervising Provider    Answer:   Lavera Guise [[id number]]  . chlorhexidine (PERIDEX) 0.12 % solution    Sig: Use as directed 10 mLs in the mouth or throat 2 (two) times daily.    Dispense:  120 mL    Refill:  1    Order Specific Question:   Supervising Provider    Answer:   Lavera Guise [[id number]]  . diclofenac sodium (VOLTAREN) 1 % GEL    Sig: Apply 4 g topically 4 (four) times daily.    Dispense:  100 g    Refill:  5    Order Specific Question:   Supervising Provider    Answer:   Lavera Guise [[id number]]  . zolpidem (AMBIEN) 10 MG tablet    Sig: Take 1 tablet (10 mg total) by mouth at bedtime as needed for sleep.    Dispense:  30 tablet    Refill:  3    Order Specific Question:   Supervising Provider    Answer:   Lavera Guise [[id number]]    Time spent: [place], MD  Internal Medicine

## 2017-10-17 LAB — CULTURE, URINE COMPREHENSIVE

## 2017-11-02 ENCOUNTER — Ambulatory Visit (INDEPENDENT_AMBULATORY_CARE_PROVIDER_SITE_OTHER): Payer: PPO | Admitting: Nurse Practitioner

## 2017-11-02 ENCOUNTER — Encounter: Payer: Self-pay | Admitting: Nurse Practitioner

## 2017-11-02 ENCOUNTER — Other Ambulatory Visit: Payer: Self-pay

## 2017-11-02 VITALS — BP 145/76 | HR 91 | Resp 16 | Ht 63.0 in | Wt 205.6 lb

## 2017-11-02 DIAGNOSIS — Z1211 Encounter for screening for malignant neoplasm of colon: Secondary | ICD-10-CM

## 2017-11-02 DIAGNOSIS — R300 Dysuria: Secondary | ICD-10-CM

## 2017-11-02 DIAGNOSIS — N390 Urinary tract infection, site not specified: Secondary | ICD-10-CM

## 2017-11-02 DIAGNOSIS — I10 Essential (primary) hypertension: Secondary | ICD-10-CM

## 2017-11-02 DIAGNOSIS — Z1212 Encounter for screening for malignant neoplasm of rectum: Principal | ICD-10-CM

## 2017-11-02 DIAGNOSIS — E1165 Type 2 diabetes mellitus with hyperglycemia: Secondary | ICD-10-CM | POA: Diagnosis not present

## 2017-11-02 LAB — POCT URINALYSIS DIPSTICK
Bilirubin, UA: NEGATIVE
Blood, UA: NEGATIVE
GLUCOSE UA: NEGATIVE
KETONES UA: NEGATIVE
NITRITE UA: NEGATIVE
PROTEIN UA: NEGATIVE
SPEC GRAV UA: 1.01 (ref 1.010–1.025)
Urobilinogen, UA: 0.2 E.U./dL
pH, UA: 7 (ref 5.0–8.0)

## 2017-11-02 MED ORDER — LEVOFLOXACIN 500 MG PO TABS: 500.0000 mg | ORAL_TABLET | Freq: Every day | ORAL | 0 refills | Status: DC

## 2017-11-02 NOTE — Progress Notes (Signed)
[facility], [place] [address]  Internal MEDICINE  Office Visit Note  Patient Name: Regina Deleon  [id number]  [id number]  Date of Service: 11/24/2017   Pt is here for routine follow up.   Chief Complaint  Patient presents with  . Urinary Tract Infection    2 wk repeat UA  . Referral    pt need referral for colonoscopy    The patient was treated for UTI at her last visit. Treated with Bactrim BID for 10 days. Urine culture indicated this antibiotic was appropriate. She states that her symptoms have resolved.  She is doe to have screening colonoscopy.       Current Medication: Outpatient Encounter Medications as of 11/02/2017  Medication Sig  . acetaminophen (TYLENOL) 500 MG tablet Take 500 mg by mouth every 6 (six) hours as needed for mild pain.  Marland Kitchen aspirin 81 MG tablet Take 81 mg by mouth every morning.   . chlorhexidine (PERIDEX) 0.12 % solution Use as directed 10 mLs in the mouth or throat 2 (two) times daily.  . cyclobenzaprine (FLEXERIL) 10 MG tablet TAKE 1 TABLET BY MOUTH EVERY DAY AT BEDTIME AS NEEDED FOR MUSCLE SPASM  . diclofenac sodium (VOLTAREN) 1 % GEL Apply 4 g topically 4 (four) times daily.  . fluticasone (FLONASE) 50 MCG/ACT nasal spray Place 2 sprays into both nostrils daily as needed for allergies or rhinitis.  Marland Kitchen gabapentin (NEURONTIN) 100 MG capsule Take 1 capsule (100 mg total) by mouth every evening.  Marland Kitchen letrozole (FEMARA) 2.5 MG tablet Take 1 tablet (2.5 mg total) by mouth daily. (Patient not taking: Reported on 10/14/2017)  . Omega-3 Fatty Acids (OMEGA 3 PO) Take 2 tablets by mouth every morning.  . ranitidine (ZANTAC) 150 MG tablet Take 150 mg by mouth 2 (two) times daily.  Marland Kitchen triamcinolone ointment (KENALOG) 0.5 % Apply 1 application topically 2 (two) times daily.  Marland Kitchen triamterene-hydrochlorothiazide (MAXZIDE-25) 37.5-25 MG tablet Take 1 tablet by mouth daily.  Marland Kitchen zolpidem (AMBIEN) 10 MG tablet Take 1 tablet (10 mg total)  by mouth at bedtime as needed for sleep.  . [DISCONTINUED] levofloxacin (LEVAQUIN) 500 MG tablet Take 1 tablet (500 mg total) by mouth daily.  . [DISCONTINUED] NIFEdipine (PROCARDIA-XL/ADALAT-CC/NIFEDICAL-XL) 30 MG 24 hr tablet Take 30 mg by mouth 2 (two) times daily.   . [DISCONTINUED] sulfamethoxazole-trimethoprim (BACTRIM DS,SEPTRA DS) 800-160 MG tablet Take 1 tablet by mouth 2 (two) times daily.   No facility-administered encounter medications on file as of 11/02/2017.     Surgical History: Past Surgical History:  Procedure Laterality Date  . ABDOMINAL HYSTERECTOMY  1986  . AXILLARY LYMPH NODE DISSECTION Left 06/26/2014   Procedure: AXILLARY LYMPH NODE DISSECTION;  Surgeon: Alphonsa Overall, MD;  Location: [place];  Service: General;  Laterality: Left;  . Blunt   lumbar  . COLONOSCOPY    . DILATION AND CURETTAGE OF UTERUS    . LAPAROSCOPIC GASTRIC BANDING  10/19/05  . MASTECTOMY Left   . MASTECTOMY MODIFIED RADICAL Left 06/26/2014   Procedure: MASTECTOMY MODIFIED RADICAL;  Surgeon: Alphonsa Overall, MD;  Location: [place];  Service: General;  Laterality: Left;  . PORT A CATH REVISION     insertion-10/15  . PORT-A-CATH REMOVAL N/A 02/07/2015   Procedure: MINOR REMOVAL PORT-A-CATH;  Surgeon: Alphonsa Overall, MD;  Location: [place];  Service: General;  Laterality: N/A;    Medical History: Past Medical History:  Diagnosis Date  .  Cancer ([place])    lt br cancer  . Edema leg    with chemo  . GERD (gastroesophageal reflux disease)   . Hyperlipidemia   . Hypertension   . Neuropathy    hands and feet post chemo  . Wears glasses     Family History: Family History  Problem Relation Age of Onset  . Asthma Mother   . Diabetes Mother   . Diabetes Father   . Heart disease Father   . Cancer Maternal Aunt   . Cancer Cousin   . Cancer Cousin   . Cancer Cousin     Social History   Socioeconomic History  . Marital  status: Married    Spouse name: Not on file  . Number of children: Not on file  . Years of education: Not on file  . Highest education level: Not on file  Occupational History  . Not on file  Social Needs  . Financial resource strain: Not on file  . Food insecurity:    Worry: Not on file    Inability: Not on file  . Transportation needs:    Medical: Not on file    Non-medical: Not on file  Tobacco Use  . Smoking status: Never Smoker  . Smokeless tobacco: Never Used  Substance and Sexual Activity  . Alcohol use: No  . Drug use: No  . Sexual activity: Yes  Lifestyle  . Physical activity:    Days per week: Not on file    Minutes per session: Not on file  . Stress: Not on file  Relationships  . Social connections:    Talks on phone: Not on file    Gets together: Not on file    Attends religious service: Not on file    Active member of club or organization: Not on file    Attends meetings of clubs or organizations: Not on file    Relationship status: Not on file  . Intimate partner violence:    Fear of current or ex partner: Not on file    Emotionally abused: Not on file    Physically abused: Not on file    Forced sexual activity: Not on file  Other Topics Concern  . Not on file  Social History Narrative  . Not on file      Review of Systems  Constitutional: Positive for fatigue. Negative for activity change and fever.  HENT: Negative for congestion, ear pain, rhinorrhea and sinus pressure.   Eyes: Negative.   Respiratory: Negative for chest tightness, shortness of breath and wheezing.   Cardiovascular: Negative for chest pain and palpitations.       Blood pressure elevated today.   Gastrointestinal: Negative for abdominal pain, constipation, diarrhea and vomiting.  Endocrine: Negative for cold intolerance, heat intolerance, polydipsia, polyphagia and polyuria.  Genitourinary: Negative for dysuria and frequency.  Musculoskeletal: Positive for arthralgias and joint  swelling.  Skin: Negative for rash.  Allergic/Immunologic: Positive for environmental allergies.  Neurological: Positive for headaches.       Legs cramping and causing her to stay awake at night.   Hematological: Negative for adenopathy. Does not bruise/bleed easily.  Psychiatric/Behavioral: Positive for sleep disturbance. Negative for behavioral problems and decreased concentration.    Today's Vitals   11/02/17 1105  BP: (!) 145/76  Pulse: 91  Resp: 16  SpO2: 97%  Weight: 205 lb 9.6 oz (93.3 kg)  Height: 5\' 3"  (1.6 m)    Physical Exam  Constitutional: She is oriented to  person, place, and time. She appears well-developed and well-nourished. No distress.  HENT:  Head: Normocephalic and atraumatic.  Right Ear: Tympanic membrane is not erythematous and not bulging.  Left Ear: Tympanic membrane is not erythematous and not bulging.  Nose: Nose normal. No rhinorrhea. Right sinus exhibits no maxillary sinus tenderness and no frontal sinus tenderness. Left sinus exhibits no maxillary sinus tenderness and no frontal sinus tenderness.  Mouth/Throat: No oropharyngeal exudate or posterior oropharyngeal erythema.  Eyes: Pupils are equal, round, and reactive to light. Conjunctivae and EOM are normal.  Neck: Normal range of motion. Neck supple. No JVD present. No tracheal deviation present. No thyromegaly present.  Cardiovascular: Normal rate, regular rhythm and normal heart sounds. Exam reveals no gallop and no friction rub.  No murmur heard. Pulmonary/Chest: Effort normal and breath sounds normal. No respiratory distress. She has no wheezes. She has no rales. She exhibits no tenderness.  Abdominal: Soft. Bowel sounds are normal. There is no tenderness.  Genitourinary:  Genitourinary Comments: U/a positive for moderate WBC  Musculoskeletal: Normal range of motion.  Lymphadenopathy:    She has no cervical adenopathy.  Neurological: She is alert and oriented to person, place, and time. No  cranial nerve deficit.  Skin: Skin is warm and dry. She is not diaphoretic.  Psychiatric: She has a normal mood and affect. Her behavior is normal. Judgment and thought content normal.  Nursing note and vitals reviewed.  Assessment/Plan: 1. Urinary tract infection without hematuria, site unspecified U/a continues to be positive for moderate WBC. Will do second round antibiotics with levofloxaxin 500mg  daily for 10 days. Adjust abx as indicated per culture and sensitivity results.   2. Dysuria - POCT Urinalysis Dipstick - CULTURE, URINE COMPREHENSIVE  3. Uncontrolled type 2 diabetes mellitus with hyperglycemia (HCC) - Microalbumin, urine  4. Essential hypertension Continue bp medication as prescribed   5. Screening for colon cancer - Ambulatory referral to Gastroenterology  General Counseling: khristie sak understanding of the findings of todays visit and agrees with plan of treatment. I have discussed any further diagnostic evaluation that may be needed or ordered today. We also reviewed her medications today. she has been encouraged to call the office with any questions or concerns that should arise related to todays visit.    Counseling:  This patient was seen by Leretha Pol FNP Collaboration with Dr Lavera Guise as a part of collaborative care agreement  Orders Placed This Encounter  Procedures  . CULTURE, URINE COMPREHENSIVE  . Microalbumin, urine  . Ambulatory referral to Gastroenterology  . POCT Urinalysis Dipstick    Meds ordered this encounter  Medications  . DISCONTD: levofloxacin (LEVAQUIN) 500 MG tablet    Sig: Take 1 tablet (500 mg total) by mouth daily.    Dispense:  10 tablet    Refill:  0    Order Specific Question:   Supervising Provider    Answer:   Lavera Guise [[id number]]    Time spent: 77 Minutes    Dr Lavera Guise Internal medicine

## 2017-11-02 NOTE — Progress Notes (Signed)
Faxed order for cologuard to [facility]

## 2017-11-03 DIAGNOSIS — K121 Other forms of stomatitis: Secondary | ICD-10-CM | POA: Insufficient documentation

## 2017-11-03 DIAGNOSIS — K1230 Oral mucositis (ulcerative), unspecified: Secondary | ICD-10-CM

## 2017-11-03 DIAGNOSIS — N390 Urinary tract infection, site not specified: Secondary | ICD-10-CM | POA: Insufficient documentation

## 2017-11-03 LAB — MICROALBUMIN, URINE: Microalbumin, Urine: 9.1 ug/mL

## 2017-11-05 ENCOUNTER — Telehealth: Payer: Self-pay

## 2017-11-05 ENCOUNTER — Other Ambulatory Visit: Payer: Self-pay | Admitting: Internal Medicine

## 2017-11-05 DIAGNOSIS — I10 Essential (primary) hypertension: Secondary | ICD-10-CM

## 2017-11-05 LAB — CULTURE, URINE COMPREHENSIVE

## 2017-11-05 NOTE — Telephone Encounter (Signed)
Lets just o ahead and stop the antibiotics. If she starts to exhibit new symptoms we can restart something different. Please advise her to drink plenty of water. Thanks

## 2017-11-05 NOTE — Telephone Encounter (Signed)
Can you check its ok to send

## 2017-11-08 ENCOUNTER — Other Ambulatory Visit: Payer: Self-pay | Admitting: Nurse Practitioner

## 2017-11-08 DIAGNOSIS — J0140 Acute pansinusitis, unspecified: Secondary | ICD-10-CM

## 2017-11-08 MED ORDER — SULFAMETHOXAZOLE-TRIMETHOPRIM 800-160 MG PO TABS: 1.0000 | ORAL_TABLET | Freq: Two times a day (BID) | ORAL | 0 refills | Status: DC

## 2017-11-08 MED ORDER — LEVOFLOXACIN 500 MG PO TABS: 500.0000 mg | ORAL_TABLET | Freq: Every day | ORAL | 0 refills | Status: DC

## 2017-11-08 NOTE — Telephone Encounter (Signed)
PT WAS HAVING SIDE EFFECTS TO LEVOFLOXACIN. PT WILL NEED A NEW RX. THANK YOU.

## 2017-11-08 NOTE — Telephone Encounter (Signed)
Changed antibiotic to bactrim DS and sent this to pharmacy.

## 2017-11-08 NOTE — Telephone Encounter (Signed)
PATIENT STILL HAS PAIN IN HEAD ON LEFT SIDE, EAR HURTS, AND RIGHT UNDERNEATH STOMACH.

## 2017-11-08 NOTE — Telephone Encounter (Signed)
Due to negative side effects, changed antibiotics to levofloxacin 500mg  daily for 10 days. New rx sent to her pharmacy.

## 2017-11-08 NOTE — Progress Notes (Signed)
Changed antibiotic to bactrim DS and sent this to pharmacy.

## 2017-11-08 NOTE — Progress Notes (Signed)
Due to negative side effects, changed antibiotics to levofloxacin 500mg  daily for 10 days. New rx sent to her pharmacy.

## 2017-11-08 NOTE — Telephone Encounter (Signed)
PT WAS NOTIFIED. 

## 2017-11-18 DIAGNOSIS — K219 Gastro-esophageal reflux disease without esophagitis: Secondary | ICD-10-CM | POA: Diagnosis not present

## 2017-11-18 DIAGNOSIS — Z1211 Encounter for screening for malignant neoplasm of colon: Secondary | ICD-10-CM | POA: Diagnosis not present

## 2017-11-24 DIAGNOSIS — E1165 Type 2 diabetes mellitus with hyperglycemia: Secondary | ICD-10-CM | POA: Insufficient documentation

## 2017-12-22 ENCOUNTER — Other Ambulatory Visit: Payer: Self-pay

## 2017-12-22 DIAGNOSIS — M170 Bilateral primary osteoarthritis of knee: Secondary | ICD-10-CM

## 2017-12-22 MED ORDER — TRIAMTERENE-HCTZ 37.5-25 MG PO TABS: 1.0000 | ORAL_TABLET | Freq: Every day | ORAL | 5 refills | Status: DC

## 2017-12-29 ENCOUNTER — Encounter
Admission: RE | Disposition: A | Discharge status: Home / Self Care | Payer: Self-pay | Source: Ambulatory Visit | Attending: Internal Medicine

## 2017-12-29 ENCOUNTER — Ambulatory Visit
Admission: RE | Admit: 2017-12-29 | Discharge: 2017-12-29 | Disposition: A | Discharge status: Home / Self Care | Payer: PPO | Source: Ambulatory Visit | Attending: Internal Medicine | Admitting: Internal Medicine

## 2017-12-29 ENCOUNTER — Ambulatory Visit: Payer: PPO | Admitting: Certified Registered"

## 2017-12-29 ENCOUNTER — Other Ambulatory Visit: Payer: Self-pay

## 2017-12-29 DIAGNOSIS — Z1211 Encounter for screening for malignant neoplasm of colon: Secondary | ICD-10-CM | POA: Diagnosis not present

## 2017-12-29 DIAGNOSIS — Z87442 Personal history of urinary calculi: Secondary | ICD-10-CM | POA: Diagnosis not present

## 2017-12-29 DIAGNOSIS — K6389 Other specified diseases of intestine: Secondary | ICD-10-CM | POA: Diagnosis not present

## 2017-12-29 DIAGNOSIS — K219 Gastro-esophageal reflux disease without esophagitis: Secondary | ICD-10-CM | POA: Diagnosis not present

## 2017-12-29 DIAGNOSIS — Z91040 Latex allergy status: Secondary | ICD-10-CM | POA: Diagnosis not present

## 2017-12-29 DIAGNOSIS — Z9884 Bariatric surgery status: Secondary | ICD-10-CM | POA: Insufficient documentation

## 2017-12-29 DIAGNOSIS — G629 Polyneuropathy, unspecified: Secondary | ICD-10-CM | POA: Diagnosis not present

## 2017-12-29 DIAGNOSIS — Z880 Allergy status to penicillin: Secondary | ICD-10-CM | POA: Diagnosis not present

## 2017-12-29 DIAGNOSIS — Z7982 Long term (current) use of aspirin: Secondary | ICD-10-CM | POA: Diagnosis not present

## 2017-12-29 DIAGNOSIS — Z803 Family history of malignant neoplasm of breast: Secondary | ICD-10-CM | POA: Diagnosis not present

## 2017-12-29 DIAGNOSIS — Z888 Allergy status to other drugs, medicaments and biological substances status: Secondary | ICD-10-CM | POA: Diagnosis not present

## 2017-12-29 DIAGNOSIS — Z79899 Other long term (current) drug therapy: Secondary | ICD-10-CM | POA: Diagnosis not present

## 2017-12-29 DIAGNOSIS — E785 Hyperlipidemia, unspecified: Secondary | ICD-10-CM | POA: Diagnosis not present

## 2017-12-29 DIAGNOSIS — I10 Essential (primary) hypertension: Secondary | ICD-10-CM | POA: Insufficient documentation

## 2017-12-29 DIAGNOSIS — Z9221 Personal history of antineoplastic chemotherapy: Secondary | ICD-10-CM | POA: Insufficient documentation

## 2017-12-29 HISTORY — PX: COLONOSCOPY WITH PROPOFOL: SHX5780

## 2017-12-29 HISTORY — DX: Personal history of urinary calculi: Z87.442

## 2017-12-29 SURGERY — COLONOSCOPY WITH PROPOFOL
Anesthesia: General

## 2017-12-29 MED ORDER — PROPOFOL 500 MG/50ML IV EMUL
INTRAVENOUS | Status: DC | PRN
  Administered 2017-12-29: 150 ug/kg/min via INTRAVENOUS

## 2017-12-29 MED ORDER — SODIUM CHLORIDE 0.9 % IV SOLN
INTRAVENOUS | Status: DC
  Administered 2017-12-29: 14:00:00 via INTRAVENOUS

## 2017-12-29 MED ORDER — PROPOFOL 10 MG/ML IV BOLUS
INTRAVENOUS | Status: DC | PRN
  Administered 2017-12-29: 50 mg via INTRAVENOUS

## 2017-12-29 NOTE — Op Note (Signed)
[facility] Gastroenterology Patient Name: Regina Deleon Procedure Date: 12/29/2017 2:48 PM MRN: [id number] Account #: [id number] Date of Birth: 1951/01/20 Admit Type: Outpatient Age: 67 Room: [facility] ENDO ROOM 2 Gender: Female Note Status: Finalized Procedure:            Colonoscopy Indications:          Screening for colorectal malignant neoplasm Providers:            Benay Pike. Alice Reichert MD, MD Referring MD:         Lavera Guise, MD (Referring MD) Medicines:            Propofol per Anesthesia Complications:        No immediate complications. Procedure:            Pre-Anesthesia Assessment:                       - The risks and benefits of the procedure and the                        sedation options and risks were discussed with the                        patient. All questions were answered and informed                        consent was obtained.                       - Patient identification and proposed procedure were                        verified prior to the procedure by the nurse. The                        procedure was verified in the procedure room.                       - ASA Grade Assessment: III - A patient with severe                        systemic disease.                       - After reviewing the risks and benefits, the patient                        was deemed in satisfactory condition to undergo the                        procedure.                       After obtaining informed consent, the colonoscope was                        passed under direct vision. Throughout the procedure,                        the patient's blood pressure, pulse, and oxygen  saturations were monitored continuously. The                        Colonoscope was introduced through the anus and                        advanced to the the cecum, identified by appendiceal                        orifice and ileocecal valve. The colonoscopy was                   performed without difficulty. The patient tolerated the                        procedure well. The quality of the bowel preparation                        was excellent. The ileocecal valve, appendiceal                        orifice, and rectum were photographed. Findings:      The perianal and digital rectal examinations were normal. Pertinent       negatives include normal sphincter tone and no palpable rectal lesions.      A patchy area of mild melanosis was found in the entire colon. Biopsies       were taken with a cold forceps for histology. biopsies taken in cecum      The exam was otherwise without abnormality. Impression:           - Melanosis in the colon. Biopsied.                       - The examination was otherwise normal. Recommendation:       - Patient has a contact number available for                        emergencies. The signs and symptoms of potential                        delayed complications were discussed with the patient.                        Return to normal activities tomorrow. Written discharge                        instructions were provided to the patient.                       - Resume previous diet.                       - Continue present medications.                       - Await pathology results.                       - Repeat colonoscopy in 10 years for screening purposes.                       - The findings  and recommendations were discussed with                        the patient and their spouse. Procedure Code(s):    --- Professional ---                       [phone], Colonoscopy, flexible; with biopsy, single or                        multiple Diagnosis Code(s):    --- Professional ---                       K63.89, Other specified diseases of intestine                       Z12.11, Encounter for screening for malignant neoplasm                        of colon CPT copyright 2017 American Medical Association. All rights  reserved. The codes documented in this report are preliminary and upon coder review may  be revised to meet current compliance requirements. Efrain Sella MD, MD 12/29/2017 3:22:54 PM This report has been signed electronically. Number of Addenda: 0 Note Initiated On: 12/29/2017 2:48 PM Scope Withdrawal Time: 0 hours 10 minutes 54 seconds  Total Procedure Duration: 0 hours 14 minutes 43 seconds       [facility]

## 2017-12-29 NOTE — Anesthesia Postprocedure Evaluation (Signed)
Anesthesia Post Note  Patient: Regina Deleon  Procedure(s) Performed: COLONOSCOPY WITH PROPOFOL (N/A )  Patient location during evaluation: PACU Anesthesia Type: General Level of consciousness: awake and alert Pain management: pain level controlled Vital Signs Assessment: post-procedure vital signs reviewed and stable Respiratory status: spontaneous breathing, nonlabored ventilation and respiratory function stable Cardiovascular status: blood pressure returned to baseline and stable Postop Assessment: no apparent nausea or vomiting Anesthetic complications: no     Last Vitals:  Vitals:   12/29/17 1357 12/29/17 1519  BP: (!) 162/81 94/71  Pulse: (!) 101 98  Resp: 18 (!) 22  Temp: (!) 36.1 C (!) 36.1 C  SpO2: 98% 100%    Last Pain:  Vitals:   12/29/17 1519  TempSrc: Tympanic  PainSc:                  Durenda Hurt

## 2017-12-29 NOTE — H&P (Signed)
Outpatient short stay form Pre-procedure 12/29/2017 2:01 PM Regina Deleon K. Alice Reichert, M.D.  Primary Physician: Placido Sou, M.D.  Reason for visit:  Colon cancer screening  History of present illness: Patient presents for colonoscopy for colon cancer screening. The patient denies complaints of abdominal pain, significant change in bowel habits, or rectal bleeding.     Current Facility-Administered Medications:  .  0.9 %  sodium chloride infusion, , Intravenous, Continuous, [name], Benay Pike, MD  Medications Prior to Admission  Medication Sig Dispense Refill Last Dose  . cyclobenzaprine (FLEXERIL) 10 MG tablet TAKE 1 TABLET BY MOUTH EVERY DAY AT BEDTIME AS NEEDED FOR MUSCLE SPASM 30 tablet 3 Past Month at Unknown time  . fluticasone (FLONASE) 50 MCG/ACT nasal spray Place 2 sprays into both nostrils daily as needed for allergies or rhinitis.   Taking  . NIFEdipine (PROCARDIA-XL/ADALAT-CC/NIFEDICAL-XL) 30 MG 24 hr tablet Take 1 tablet (30 mg total) by mouth 2 (two) times daily. 180 tablet 1 12/29/2017 at 0545  . Omega-3 Fatty Acids (OMEGA 3 PO) Take 2 tablets by mouth every morning.   Past Week at Unknown time  . ranitidine (ZANTAC) 150 MG tablet Take 150 mg by mouth 2 (two) times daily.   Past Week at Unknown time  . triamterene-hydrochlorothiazide (MAXZIDE-25) 37.5-25 MG tablet Take 1 tablet by mouth daily. 30 tablet 5 12/29/2017 at 0545  . acetaminophen (TYLENOL) 500 MG tablet Take 500 mg by mouth every 6 (six) hours as needed for mild pain.   Taking  . aspirin 81 MG tablet Take 81 mg by mouth every morning.    Taking  . chlorhexidine (PERIDEX) 0.12 % solution Use as directed 10 mLs in the mouth or throat 2 (two) times daily. 120 mL 1   . diclofenac sodium (VOLTAREN) 1 % GEL Apply 4 g topically 4 (four) times daily. 100 g 5 12/26/2017  . gabapentin (NEURONTIN) 100 MG capsule Take 1 capsule (100 mg total) by mouth every evening. (Patient not taking: Reported on 12/29/2017) 30 capsule 3 Not Taking at  Unknown time  . letrozole (FEMARA) 2.5 MG tablet Take 1 tablet (2.5 mg total) by mouth daily. (Patient not taking: Reported on 10/14/2017) 90 tablet 3 Completed Course at Unknown time  . sulfamethoxazole-trimethoprim (BACTRIM DS,SEPTRA DS) 800-160 MG tablet Take 1 tablet by mouth 2 (two) times daily. (Patient not taking: Reported on 12/29/2017) 20 tablet 0 Completed Course at Unknown time  . triamcinolone ointment (KENALOG) 0.5 % Apply 1 application topically 2 (two) times daily. (Patient not taking: Reported on 12/29/2017) 30 g 0 Not Taking at Unknown time  . zolpidem (AMBIEN) 10 MG tablet Take 1 tablet (10 mg total) by mouth at bedtime as needed for sleep. 30 tablet 3 12/26/2017     Allergies  Allergen Reactions  . Darvocet [Propoxyphene N-Acetaminophen] Shortness Of Breath and Swelling  . Latex Shortness Of Breath and Swelling  . Enalapril Other (See Comments)    cough  . Penicillins     Childhood - has taken since then with no problem  . Vasopressin      Past Medical History:  Diagnosis Date  . Cancer ([place])    lt br cancer  . Edema leg    with chemo  . GERD (gastroesophageal reflux disease)   . History of kidney stones   . Hyperlipidemia   . Hypertension   . Neuropathy    hands and feet post chemo  . Wears glasses     Review of systems:  Otherwise negative.  Physical Exam  Gen: Alert, oriented. Appears stated age.  HEENT: [place]/AT. PERRLA. Lungs: CTA, no wheezes. CV: RR nl S1, S2. Abd: soft, benign, no masses. BS+ Ext: No edema. Pulses 2+    Planned procedures: Proceed with colonoscopy. The patient understands the nature of the planned procedure, indications, risks, alternatives and potential complications including but not limited to bleeding, infection, perforation, damage to internal organs and possible oversedation/side effects from anesthesia. The patient agrees and gives consent to proceed.  Please refer to procedure notes for findings, recommendations and  patient disposition/instructions.     [name] K. Alice Reichert, M.D. Gastroenterology 12/29/2017  2:01 PM

## 2017-12-29 NOTE — Anesthesia Post-op Follow-up Note (Signed)
Anesthesia QCDR form completed.        

## 2017-12-29 NOTE — Anesthesia Preprocedure Evaluation (Signed)
Anesthesia Evaluation  Patient identified by MRN, date of birth, ID band Patient awake    Reviewed: Allergy & Precautions, H&P , NPO status , Patient's Chart, lab work & pertinent test results  Airway Mallampati: III  TM Distance: >3 FB Neck ROM: full    Dental  (+) Missing   Pulmonary neg pulmonary ROS, neg COPD,           Cardiovascular hypertension, (-) Past MI, (-) Cardiac Stents and (-) CABG negative cardio ROS       Neuro/Psych negative neurological ROS  negative psych ROS   GI/Hepatic negative GI ROS, Neg liver ROS,   Endo/Other  negative endocrine ROS  Renal/GU negative Renal ROS  negative genitourinary   Musculoskeletal   Abdominal   Peds  Hematology negative hematology ROS (+)   Anesthesia Other Findings Past Medical History: No date: Cancer ([place])     Comment:  lt br cancer No date: Edema leg     Comment:  with chemo No date: GERD (gastroesophageal reflux disease) No date: History of kidney stones No date: Hyperlipidemia No date: Hypertension No date: Neuropathy     Comment:  hands and feet post chemo No date: Wears glasses  Past Surgical History: 1986: ABDOMINAL HYSTERECTOMY 06/26/2014: AXILLARY LYMPH NODE DISSECTION; Left     Comment:  Procedure: AXILLARY LYMPH NODE DISSECTION;  Surgeon:               Alphonsa Overall, MD;  Location: [place];                Service: General;  Laterality: Left; 1990, 1991: BACK SURGERY     Comment:  lumbar 06/2014: BREAST SURGERY; Left No date: COLONOSCOPY No date: DILATION AND CURETTAGE OF UTERUS 10/19/05: LAPAROSCOPIC GASTRIC BANDING No date: MASTECTOMY; Left 06/26/2014: MASTECTOMY MODIFIED RADICAL; Left     Comment:  Procedure: MASTECTOMY MODIFIED RADICAL;  Surgeon: Alphonsa Overall, MD;  Location: [place];                Service: General;  Laterality: Left; No date: PORT A CATH REVISION     Comment:   insertion-10/15 02/07/2015: PORT-A-CATH REMOVAL; N/A     Comment:  Procedure: MINOR REMOVAL PORT-A-CATH;  Surgeon: Alphonsa Overall, MD;  Location: [place];                Service: General;  Laterality: N/A;  BMI    Body Mass Index:  35.78 kg/m      Reproductive/Obstetrics negative OB ROS                             Anesthesia Physical Anesthesia Plan  ASA: II  Anesthesia Plan: General   Post-op Pain Management:    Induction:   PONV Risk Score and Plan: Propofol infusion  Airway Management Planned: Natural Airway and Nasal Cannula  Additional Equipment:   Intra-op Plan:   Post-operative Plan:   Informed Consent: I have reviewed the patients History and Physical, chart, labs and discussed the procedure including the risks, benefits and alternatives for the proposed anesthesia with the patient or authorized representative who has indicated his/her understanding and acceptance.   Dental Advisory Given  Plan Discussed with: Anesthesiologist, CRNA and Surgeon  Anesthesia Plan Comments:  Anesthesia Quick Evaluation  

## 2017-12-29 NOTE — Interval H&P Note (Signed)
History and Physical Interval Note:  12/29/2017 2:04 PM  Regina Deleon  has presented today for surgery, with the diagnosis of COLON CANCER SCREENING  The various methods of treatment have been discussed with the patient and family. After consideration of risks, benefits and other options for treatment, the patient has consented to  Procedure(s): COLONOSCOPY WITH PROPOFOL (N/A) as a surgical intervention .  The patient's history has been reviewed, patient examined, no change in status, stable for surgery.  I have reviewed the patient's chart and labs.  Questions were answered to the patient's satisfaction.     [place], [place]

## 2017-12-29 NOTE — Anesthesia Procedure Notes (Signed)
Date/Time: 12/29/2017 3:05 PM Performed by: Nelda Marseille, CRNA Pre-anesthesia Checklist: Patient identified, Emergency Drugs available, Suction available, Patient being monitored and Timeout performed Oxygen Delivery Method: Nasal cannula

## 2017-12-29 NOTE — Transfer of Care (Signed)
Immediate Anesthesia Transfer of Care Note  Patient: Regina Deleon  Procedure(s) Performed: COLONOSCOPY WITH PROPOFOL (N/A )  Patient Location: PACU  Anesthesia Type:General  Level of Consciousness: awake, alert  and oriented  Airway & Oxygen Therapy: Patient Spontanous Breathing and Patient connected to nasal cannula oxygen  Post-op Assessment: Report given to RN and Post -op Vital signs reviewed and stable  Post vital signs: Reviewed and stable  Last Vitals:  Vitals Value Taken Time  BP 94/71 12/29/2017  3:20 PM  Temp 36.1 C 12/29/2017  3:19 PM  Pulse 97 12/29/2017  3:20 PM  Resp 18 12/29/2017  3:20 PM  SpO2 100 % 12/29/2017  3:20 PM  Vitals shown include unvalidated device data.  Last Pain:  Vitals:   12/29/17 1519  TempSrc: Tympanic  PainSc:          Complications: No apparent anesthesia complications

## 2017-12-30 ENCOUNTER — Encounter: Payer: Self-pay | Admitting: Internal Medicine

## 2017-12-31 LAB — SURGICAL PATHOLOGY

## 2018-01-12 ENCOUNTER — Other Ambulatory Visit: Payer: Self-pay | Admitting: Hematology and Oncology

## 2018-01-12 DIAGNOSIS — Z853 Personal history of malignant neoplasm of breast: Secondary | ICD-10-CM

## 2018-02-10 ENCOUNTER — Ambulatory Visit
Admission: RE | Admit: 2018-02-10 | Discharge: 2018-02-10 | Disposition: A | Discharge status: Home / Self Care | Payer: PPO | Source: Ambulatory Visit | Attending: Hematology and Oncology | Admitting: Hematology and Oncology

## 2018-02-10 DIAGNOSIS — Z1231 Encounter for screening mammogram for malignant neoplasm of breast: Secondary | ICD-10-CM | POA: Diagnosis not present

## 2018-02-10 DIAGNOSIS — Z853 Personal history of malignant neoplasm of breast: Secondary | ICD-10-CM

## 2018-03-10 ENCOUNTER — Ambulatory Visit (INDEPENDENT_AMBULATORY_CARE_PROVIDER_SITE_OTHER): Payer: PPO | Admitting: Adult Health

## 2018-03-10 ENCOUNTER — Encounter: Payer: Self-pay | Admitting: Adult Health

## 2018-03-10 VITALS — BP 132/82 | HR 78 | Resp 16 | Ht 63.0 in | Wt 205.8 lb

## 2018-03-10 DIAGNOSIS — M170 Bilateral primary osteoarthritis of knee: Secondary | ICD-10-CM | POA: Diagnosis not present

## 2018-03-10 DIAGNOSIS — F5101 Primary insomnia: Secondary | ICD-10-CM | POA: Diagnosis not present

## 2018-03-10 DIAGNOSIS — E785 Hyperlipidemia, unspecified: Secondary | ICD-10-CM | POA: Diagnosis not present

## 2018-03-10 DIAGNOSIS — J0110 Acute frontal sinusitis, unspecified: Secondary | ICD-10-CM

## 2018-03-10 DIAGNOSIS — K219 Gastro-esophageal reflux disease without esophagitis: Secondary | ICD-10-CM | POA: Diagnosis not present

## 2018-03-10 DIAGNOSIS — I10 Essential (primary) hypertension: Secondary | ICD-10-CM | POA: Diagnosis not present

## 2018-03-10 MED ORDER — AMOXICILLIN-POT CLAVULANATE 875-125 MG PO TABS: 1.0000 | ORAL_TABLET | Freq: Two times a day (BID) | ORAL | 0 refills | Status: DC

## 2018-03-10 MED ORDER — TRIAMTERENE-HCTZ 37.5-25 MG PO TABS: 1.0000 | ORAL_TABLET | Freq: Every day | ORAL | 5 refills | Status: DC

## 2018-03-10 MED ORDER — DICLOFENAC SODIUM 1 % TD GEL: 4.0000 g | Freq: Four times a day (QID) | TRANSDERMAL | 5 refills | Status: DC

## 2018-03-10 MED ORDER — ZOLPIDEM TARTRATE 10 MG PO TABS: 10.0000 mg | ORAL_TABLET | Freq: Every evening | ORAL | 3 refills | Status: DC | PRN

## 2018-03-10 NOTE — Patient Instructions (Signed)

## 2018-03-10 NOTE — Progress Notes (Signed)
[facility] [place], [place] [address]  Internal MEDICINE  Office Visit Note  Patient Name: Regina Deleon  [id number]  [id number]  Date of Service: 03/10/2018  Chief Complaint  Patient presents with  . Gastroesophageal Reflux    medication refills   . Hypertension  . Hyperlipidemia    HPI  Patient is here for four-month follow-up on GERD, hypertension, and hyperlipidemia.  She reports she had stopped taking her Zantac for GERD due to seeing that it could cause cancer.  She states that the medication worked really well but she does not want that anymore.  She denies any issues with her blood pressure or her cholesterol currently.  She is requesting refills on few of her medications which will be right at this visit.  Acutely, she is complaining of ear pressure as well as sinus pressure postnasal drip and a mild sore throat.  She has had an intermittent headache and states that she feels like her head is clogged up.   Current Medication: Outpatient Encounter Medications as of 03/10/2018  Medication Sig  . acetaminophen (TYLENOL) 500 MG tablet Take 500 mg by mouth every 6 (six) hours as needed for mild pain.  Marland Kitchen aspirin 81 MG tablet Take 81 mg by mouth every morning.   . chlorhexidine (PERIDEX) 0.12 % solution Use as directed 10 mLs in the mouth or throat 2 (two) times daily.  . cyclobenzaprine (FLEXERIL) 10 MG tablet TAKE 1 TABLET BY MOUTH EVERY DAY AT BEDTIME AS NEEDED FOR MUSCLE SPASM  . diclofenac sodium (VOLTAREN) 1 % GEL Apply 4 g topically 4 (four) times daily.  . fluticasone (FLONASE) 50 MCG/ACT nasal spray Place 2 sprays into both nostrils daily as needed for allergies or rhinitis.  Marland Kitchen gabapentin (NEURONTIN) 100 MG capsule Take 1 capsule (100 mg total) by mouth every evening.  Marland Kitchen letrozole (FEMARA) 2.5 MG tablet Take 1 tablet (2.5 mg total) by mouth daily.  Marland Kitchen NIFEdipine (PROCARDIA-XL/ADALAT-CC/NIFEDICAL-XL) 30 MG 24 hr tablet Take 1 tablet (30 mg total)  by mouth 2 (two) times daily.  . Omega-3 Fatty Acids (OMEGA 3 PO) Take 2 tablets by mouth every morning.  . ranitidine (ZANTAC) 150 MG tablet Take 150 mg by mouth 2 (two) times daily.  Marland Kitchen triamterene-hydrochlorothiazide (MAXZIDE-25) 37.5-25 MG tablet Take 1 tablet by mouth daily.  Marland Kitchen zolpidem (AMBIEN) 10 MG tablet Take 1 tablet (10 mg total) by mouth at bedtime as needed for sleep.  . [DISCONTINUED] diclofenac sodium (VOLTAREN) 1 % GEL Apply 4 g topically 4 (four) times daily.  . [DISCONTINUED] triamterene-hydrochlorothiazide (MAXZIDE-25) 37.5-25 MG tablet Take 1 tablet by mouth daily.  . [DISCONTINUED] zolpidem (AMBIEN) 10 MG tablet Take 1 tablet (10 mg total) by mouth at bedtime as needed for sleep.  Marland Kitchen amoxicillin-clavulanate (AUGMENTIN) 875-125 MG tablet Take 1 tablet by mouth 2 (two) times daily.  . [DISCONTINUED] sulfamethoxazole-trimethoprim (BACTRIM DS,SEPTRA DS) 800-160 MG tablet Take 1 tablet by mouth 2 (two) times daily. (Patient not taking: Reported on 12/29/2017)  . [DISCONTINUED] triamcinolone ointment (KENALOG) 0.5 % Apply 1 application topically 2 (two) times daily. (Patient not taking: Reported on 12/29/2017)   No facility-administered encounter medications on file as of 03/10/2018.     Surgical History: Past Surgical History:  Procedure Laterality Date  . ABDOMINAL HYSTERECTOMY  1986  . AXILLARY LYMPH NODE DISSECTION Left 06/26/2014   Procedure: AXILLARY LYMPH NODE DISSECTION;  Surgeon: Alphonsa Overall, MD;  Location: [place];  Service: General;  Laterality: Left;  .  [place]   lumbar  . BREAST SURGERY Left 06/2014  . COLONOSCOPY    . COLONOSCOPY WITH PROPOFOL N/A 12/29/2017   Procedure: COLONOSCOPY WITH PROPOFOL;  Surgeon: Toledo, Benay Pike, MD;  Location: [facility];  Service: Gastroenterology;  Laterality: N/A;  . DILATION AND CURETTAGE OF UTERUS    . LAPAROSCOPIC GASTRIC BANDING  10/19/05  . MASTECTOMY Left   . MASTECTOMY MODIFIED RADICAL  Left 06/26/2014   Procedure: MASTECTOMY MODIFIED RADICAL;  Surgeon: Alphonsa Overall, MD;  Location: [place];  Service: General;  Laterality: Left;  . PORT A CATH REVISION     insertion-10/15  . PORT-A-CATH REMOVAL N/A 02/07/2015   Procedure: MINOR REMOVAL PORT-A-CATH;  Surgeon: Alphonsa Overall, MD;  Location: [place];  Service: General;  Laterality: N/A;    Medical History: Past Medical History:  Diagnosis Date  . Cancer ([place])    lt br cancer  . Edema leg    with chemo  . GERD (gastroesophageal reflux disease)   . History of kidney stones   . Hyperlipidemia   . Hypertension   . Neuropathy    hands and feet post chemo  . Wears glasses     Family History: Family History  Problem Relation Age of Onset  . Asthma Mother   . Diabetes Mother   . Diabetes Father   . Heart disease Father   . Cancer Maternal Aunt   . Cancer Cousin   . Cancer Cousin   . Cancer Cousin     Social History   Socioeconomic History  . Marital status: Married    Spouse name: Not on file  . Number of children: Not on file  . Years of education: Not on file  . Highest education level: Not on file  Occupational History  . Not on file  Social Needs  . Financial resource strain: Not on file  . Food insecurity:    Worry: Not on file    Inability: Not on file  . Transportation needs:    Medical: Not on file    Non-medical: Not on file  Tobacco Use  . Smoking status: Never Smoker  . Smokeless tobacco: Never Used  Substance and Sexual Activity  . Alcohol use: No  . Drug use: No  . Sexual activity: Yes  Lifestyle  . Physical activity:    Days per week: Not on file    Minutes per session: Not on file  . Stress: Not on file  Relationships  . Social connections:    Talks on phone: Not on file    Gets together: Not on file    Attends religious service: Not on file    Active member of club or organization: Not on file    Attends meetings of clubs or organizations:  Not on file    Relationship status: Not on file  . Intimate partner violence:    Fear of current or ex partner: Not on file    Emotionally abused: Not on file    Physically abused: Not on file    Forced sexual activity: Not on file  Other Topics Concern  . Not on file  Social History Narrative  . Not on file      Review of Systems  Constitutional: Negative for chills, fatigue and unexpected weight change.  HENT: Positive for ear pain, postnasal drip and sinus pressure. Negative for congestion, rhinorrhea, sneezing and sore throat.   Eyes: Negative for photophobia, pain and redness.  Respiratory: Negative for cough, chest tightness and shortness of breath.   Cardiovascular: Negative for chest pain and palpitations.  Gastrointestinal: Negative for abdominal pain, constipation, diarrhea, nausea and vomiting.  Endocrine: Negative.   Genitourinary: Negative for dysuria and frequency.  Musculoskeletal: Negative for arthralgias, back pain, joint swelling and neck pain.  Skin: Negative for rash.  Allergic/Immunologic: Negative.   Neurological: Negative for tremors and numbness.  Hematological: Negative for adenopathy. Does not bruise/bleed easily.  Psychiatric/Behavioral: Negative for behavioral problems and sleep disturbance. The patient is not nervous/anxious.     Vital Signs: BP 132/82 (BP Location: Right Arm, Patient Position: Sitting, Cuff Size: Normal)   Pulse 78   Resp 16   Ht 5\' 3"  (1.6 m)   Wt 205 lb 12.8 oz (93.4 kg)   LMP 06/02/1984   SpO2 95%   BMI 36.46 kg/m    Physical Exam  Constitutional: She is oriented to person, place, and time. She appears well-developed and well-nourished. No distress.  HENT:  Head: Normocephalic and atraumatic.  Mouth/Throat: Oropharynx is clear and moist. No oropharyngeal exudate.  Eyes: Pupils are equal, round, and reactive to light. EOM are normal.  Neck: Normal range of motion. Neck supple. No JVD present. No tracheal deviation  present. No thyromegaly present.  Cardiovascular: Normal rate, regular rhythm and normal heart sounds. Exam reveals no gallop and no friction rub.  No murmur heard. Pulmonary/Chest: Effort normal and breath sounds normal. No respiratory distress. She has no wheezes. She has no rales. She exhibits no tenderness.  Abdominal: Soft. There is no tenderness. There is no guarding.  Musculoskeletal: Normal range of motion.  Lymphadenopathy:    She has no cervical adenopathy.  Neurological: She is alert and oriented to person, place, and time. No cranial nerve deficit.  Skin: Skin is warm and dry. She is not diaphoretic.  Psychiatric: She has a normal mood and affect. Her behavior is normal. Judgment and thought content normal.  Nursing note and vitals reviewed.   Assessment/Plan: 1. Acute non-recurrent frontal sinusitis Patient given course of Augmentin. - amoxicillin-clavulanate (AUGMENTIN) 875-125 MG tablet; Take 1 tablet by mouth 2 (two) times daily.  Dispense: 14 tablet; Refill: 0  2. Primary insomnia Refill patient's Ambien for insomnia. - zolpidem (AMBIEN) 10 MG tablet; Take 1 tablet (10 mg total) by mouth at bedtime as needed for sleep.  Dispense: 30 tablet; Refill: 3  3. Primary osteoarthritis of knees, bilateral Refill patient's Voltaren gel as well as Maxide for her osteoarthritis. - triamterene-hydrochlorothiazide (MAXZIDE-25) 37.5-25 MG tablet; Take 1 tablet by mouth daily.  Dispense: 30 tablet; Refill: 5 - diclofenac sodium (VOLTAREN) 1 % GEL; Apply 4 g topically 4 (four) times daily.  Dispense: 100 g; Refill: 5  4. Essential hypertension BP stable at this time.  Continue current medications as directed.  5. Hyperlipidemia, unspecified hyperlipidemia type Patient does not have a recent lipid panel and reports that she will get one with her physical in the coming months  6. Gastroesophageal reflux disease without esophagitis Patient no longer taking Zantac.  We discussed  alternatives to Zantac.  General Counseling: jaidee stipe understanding of the findings of todays visit and agrees with plan of treatment. I have discussed any further diagnostic evaluation that may be needed or ordered today. We also reviewed her medications today. she has been encouraged to call the office with any questions or concerns that should arise related to todays visit.    No orders of the defined types were placed in this encounter.  Meds ordered this encounter  Medications  . zolpidem (AMBIEN) 10 MG tablet    Sig: Take 1 tablet (10 mg total) by mouth at bedtime as needed for sleep.    Dispense:  30 tablet    Refill:  3  . triamterene-hydrochlorothiazide (MAXZIDE-25) 37.5-25 MG tablet    Sig: Take 1 tablet by mouth daily.    Dispense:  30 tablet    Refill:  5  . diclofenac sodium (VOLTAREN) 1 % GEL    Sig: Apply 4 g topically 4 (four) times daily.    Dispense:  100 g    Refill:  5  . amoxicillin-clavulanate (AUGMENTIN) 875-125 MG tablet    Sig: Take 1 tablet by mouth 2 (two) times daily.    Dispense:  14 tablet    Refill:  0    Time spent: 25 Minutes   This patient was seen by Orson Gear AGNP-C in Collaboration with Dr Lavera Guise as a part of collaborative care agreement     Kendell Bane AGNP-C Internal medicine

## 2018-03-16 ENCOUNTER — Other Ambulatory Visit: Payer: Self-pay

## 2018-03-16 DIAGNOSIS — T451X5A Adverse effect of antineoplastic and immunosuppressive drugs, initial encounter: Principal | ICD-10-CM

## 2018-03-16 DIAGNOSIS — G620 Drug-induced polyneuropathy: Secondary | ICD-10-CM

## 2018-03-16 MED ORDER — NEOMYCIN-POLYMYXIN-HC 1 % OT SOLN: OTIC | 1 refills | Status: DC

## 2018-03-16 MED ORDER — GABAPENTIN 100 MG PO CAPS: 100.0000 mg | ORAL_CAPSULE | Freq: Every evening | ORAL | 3 refills | Status: DC

## 2018-03-16 NOTE — Telephone Encounter (Signed)
Pt called stating she was seen on 11/7 and she is currently taking antibiotics for ear infection. She also mentioned that she has used ear drops previously for the pain and it seemed to help more effectively. Per Kaleen Mask was sent to pt pharmacy for ear drops and pt was notified.

## 2018-04-07 ENCOUNTER — Other Ambulatory Visit: Payer: Self-pay

## 2018-04-07 DIAGNOSIS — T451X5A Adverse effect of antineoplastic and immunosuppressive drugs, initial encounter: Principal | ICD-10-CM

## 2018-04-07 DIAGNOSIS — G620 Drug-induced polyneuropathy: Secondary | ICD-10-CM

## 2018-04-07 MED ORDER — GABAPENTIN 100 MG PO CAPS: 100.0000 mg | ORAL_CAPSULE | Freq: Every evening | ORAL | 0 refills | Status: DC

## 2018-05-20 ENCOUNTER — Encounter: Payer: Self-pay | Admitting: Nurse Practitioner

## 2018-05-20 ENCOUNTER — Ambulatory Visit (INDEPENDENT_AMBULATORY_CARE_PROVIDER_SITE_OTHER): Payer: PPO | Admitting: Nurse Practitioner

## 2018-05-20 VITALS — BP 147/87 | HR 104 | Resp 16 | Ht 63.0 in | Wt 206.0 lb

## 2018-05-20 DIAGNOSIS — M5431 Sciatica, right side: Secondary | ICD-10-CM | POA: Diagnosis not present

## 2018-05-20 DIAGNOSIS — I10 Essential (primary) hypertension: Secondary | ICD-10-CM | POA: Diagnosis not present

## 2018-05-20 MED ORDER — METHYLPREDNISOLONE 4 MG PO TBPK: ORAL_TABLET | ORAL | 0 refills | Status: DC

## 2018-05-20 MED ORDER — TRAMADOL HCL 50 MG PO TABS: 50.0000 mg | ORAL_TABLET | Freq: Three times a day (TID) | ORAL | 0 refills | Status: DC | PRN

## 2018-05-20 NOTE — Progress Notes (Signed)
[facility] [place], [place] [address]  Internal MEDICINE  Office Visit Note  Patient Name: Regina Deleon  [id number]  [id number]  Date of Service: 05/29/2018  Chief Complaint  Patient presents with  . Pain    pain in crease of hip and goes down her leg, feels like sonmething underneath the skin, didnt notice anything in the area, no rash or drainage, hurts when sitting down, left side doesnt hurt, noticed the pain near new years     The patient is complaining of pain in right hip, at the crease between buttock and right leg. Pain is so severe, she cannot sit comfortably. Pain radiates to to the back of the right upper leg. Pain has been getting more severe over the past few days. She does not recall any injury or trauma to the right hip or leg.       Current Medication: Outpatient Encounter Medications as of 05/20/2018  Medication Sig  . acetaminophen (TYLENOL) 500 MG tablet Take 500 mg by mouth every 6 (six) hours as needed for mild pain.  Marland Kitchen aspirin 81 MG tablet Take 81 mg by mouth every morning.   . chlorhexidine (PERIDEX) 0.12 % solution Use as directed 10 mLs in the mouth or throat 2 (two) times daily.  . cyclobenzaprine (FLEXERIL) 10 MG tablet TAKE 1 TABLET BY MOUTH EVERY DAY AT BEDTIME AS NEEDED FOR MUSCLE SPASM  . diclofenac sodium (VOLTAREN) 1 % GEL Apply 4 g topically 4 (four) times daily.  . fluticasone (FLONASE) 50 MCG/ACT nasal spray Place 2 sprays into both nostrils daily as needed for allergies or rhinitis.  Marland Kitchen gabapentin (NEURONTIN) 100 MG capsule Take 1 capsule (100 mg total) by mouth every evening.  Marland Kitchen letrozole (FEMARA) 2.5 MG tablet Take 1 tablet (2.5 mg total) by mouth daily.  . NEOMYCIN-POLYMYXIN-HYDROCORTISONE (CORTISPORIN) 1 % SOLN OTIC solution Place 3-4 drops in infected ear two times  daily.  Marland Kitchen NIFEdipine (PROCARDIA-XL/ADALAT-CC/NIFEDICAL-XL) 30 MG 24 hr tablet Take 1 tablet (30 mg total) by mouth 2 (two) times daily.  . Omega-3  Fatty Acids (OMEGA 3 PO) Take 2 tablets by mouth every morning.  . ranitidine (ZANTAC) 150 MG tablet Take 150 mg by mouth 2 (two) times daily.  Marland Kitchen triamterene-hydrochlorothiazide (MAXZIDE-25) 37.5-25 MG tablet Take 1 tablet by mouth daily.  Marland Kitchen zolpidem (AMBIEN) 10 MG tablet Take 1 tablet (10 mg total) by mouth at bedtime as needed for sleep.  Marland Kitchen amoxicillin-clavulanate (AUGMENTIN) 875-125 MG tablet Take 1 tablet by mouth 2 (two) times daily. (Patient not taking: Reported on 05/20/2018)  . methylPREDNISolone (MEDROL) 4 MG TBPK tablet Take by mouth as directed for 6 days  . traMADol (ULTRAM) 50 MG tablet Take 1 tablet (50 mg total) by mouth every 8 (eight) hours as needed for moderate pain.   No facility-administered encounter medications on file as of 05/20/2018.     Surgical History: Past Surgical History:  Procedure Laterality Date  . ABDOMINAL HYSTERECTOMY  1986  . AXILLARY LYMPH NODE DISSECTION Left 06/26/2014   Procedure: AXILLARY LYMPH NODE DISSECTION;  Surgeon: Alphonsa Overall, MD;  Location: [place];  Service: General;  Laterality: Left;  . [place]   lumbar  . BREAST SURGERY Left 06/2014  . COLONOSCOPY    . COLONOSCOPY WITH PROPOFOL N/A 12/29/2017   Procedure: COLONOSCOPY WITH PROPOFOL;  Surgeon: Toledo, Benay Pike, MD;  Location: [facility];  Service: Gastroenterology;  Laterality: N/A;  . DILATION AND CURETTAGE OF  UTERUS    . LAPAROSCOPIC GASTRIC BANDING  10/19/05  . MASTECTOMY Left   . MASTECTOMY MODIFIED RADICAL Left 06/26/2014   Procedure: MASTECTOMY MODIFIED RADICAL;  Surgeon: Alphonsa Overall, MD;  Location: [place];  Service: General;  Laterality: Left;  . PORT A CATH REVISION     insertion-10/15  . PORT-A-CATH REMOVAL N/A 02/07/2015   Procedure: MINOR REMOVAL PORT-A-CATH;  Surgeon: Alphonsa Overall, MD;  Location: [place];  Service: General;  Laterality: N/A;    Medical History: Past Medical History:  Diagnosis  Date  . Cancer ([place])    lt br cancer  . Edema leg    with chemo  . GERD (gastroesophageal reflux disease)   . History of kidney stones   . Hyperlipidemia   . Hypertension   . Neuropathy    hands and feet post chemo  . Wears glasses     Family History: Family History  Problem Relation Age of Onset  . Asthma Mother   . Diabetes Mother   . Diabetes Father   . Heart disease Father   . Cancer Maternal Aunt   . Cancer Cousin   . Cancer Cousin   . Cancer Cousin     Social History   Socioeconomic History  . Marital status: Married    Spouse name: Not on file  . Number of children: Not on file  . Years of education: Not on file  . Highest education level: Not on file  Occupational History  . Not on file  Social Needs  . Financial resource strain: Not on file  . Food insecurity:    Worry: Not on file    Inability: Not on file  . Transportation needs:    Medical: Not on file    Non-medical: Not on file  Tobacco Use  . Smoking status: Never Smoker  . Smokeless tobacco: Never Used  Substance and Sexual Activity  . Alcohol use: No  . Drug use: No  . Sexual activity: Yes  Lifestyle  . Physical activity:    Days per week: Not on file    Minutes per session: Not on file  . Stress: Not on file  Relationships  . Social connections:    Talks on phone: Not on file    Gets together: Not on file    Attends religious service: Not on file    Active member of club or organization: Not on file    Attends meetings of clubs or organizations: Not on file    Relationship status: Not on file  . Intimate partner violence:    Fear of current or ex partner: Not on file    Emotionally abused: Not on file    Physically abused: Not on file    Forced sexual activity: Not on file  Other Topics Concern  . Not on file  Social History Narrative  . Not on file      Review of Systems  Constitutional: Positive for activity change. Negative for chills, fatigue and unexpected weight  change.  HENT: Negative for congestion, postnasal drip, rhinorrhea, sneezing and sore throat.   Respiratory: Negative for cough, chest tightness, shortness of breath and wheezing.   Cardiovascular: Negative for chest pain and palpitations.       Elevated blood pressure today, likely from increased pain.   Gastrointestinal: Negative for abdominal pain, constipation, diarrhea, nausea and vomiting.  Genitourinary: Negative for dysuria and frequency.  Musculoskeletal: Positive for arthralgias and back pain. Negative for joint  swelling and neck pain.  Skin: Negative for rash.  Allergic/Immunologic: Negative for environmental allergies.  Neurological: Negative for dizziness, tremors, numbness and headaches.  Hematological: Negative for adenopathy. Does not bruise/bleed easily.  Psychiatric/Behavioral: Negative for behavioral problems (Depression), sleep disturbance and suicidal ideas. The patient is not nervous/anxious.     Today's Vitals   05/20/18 1551  BP: (!) 147/87  Pulse: (!) 104  Resp: 16  SpO2: 97%  Weight: 206 lb (93.4 kg)  Height: 5\' 3"  (1.6 m)   Body mass index is 36.49 kg/m.   Physical Exam Vitals signs and nursing note reviewed.  Constitutional:      General: She is in acute distress.     Appearance: She is well-developed. She is not diaphoretic.  HENT:     Head: Normocephalic and atraumatic.     Mouth/Throat:     Pharynx: No oropharyngeal exudate.  Eyes:     Pupils: Pupils are equal, round, and reactive to light.  Neck:     Musculoskeletal: Normal range of motion and neck supple.     Thyroid: No thyromegaly.     Vascular: No JVD.     Trachea: No tracheal deviation.  Cardiovascular:     Rate and Rhythm: Normal rate and regular rhythm.     Heart sounds: Normal heart sounds. No murmur. No friction rub. No gallop.   Pulmonary:     Effort: Pulmonary effort is normal. No respiratory distress.     Breath sounds: Normal breath sounds. No wheezing or rales.  Chest:      Chest wall: No tenderness.  Abdominal:     General: Bowel sounds are normal.     Palpations: Abdomen is soft.  Musculoskeletal: Normal range of motion.     Comments: Moderate to severe pain in posterior right upper leg and into the crease between right buttock and right leg. No redness, swelling, or bony abnormalities noted today. Patient unable to sit in comfortable position.   Lymphadenopathy:     Cervical: No cervical adenopathy.  Skin:    General: Skin is warm and dry.  Neurological:     Mental Status: She is alert and oriented to person, place, and time.     Cranial Nerves: No cranial nerve deficit.  Psychiatric:        Behavior: Behavior normal.        Thought Content: Thought content normal.        Judgment: Judgment normal.    Assessment/Plan: 1. Right sided sciatica Start medrol dose pack. Take as directed for 6 days. Rest leg and apply low heat to affected areas. Add tramadol to take up to three times daily if needed for pain. Advised patient not to drive or work after taking this medication as it may cause dizziness and drowsiness.  - methylPREDNISolone (MEDROL) 4 MG TBPK tablet; Take by mouth as directed for 6 days  Dispense: 21 tablet; Refill: 0 - traMADol (ULTRAM) 50 MG tablet; Take 1 tablet (50 mg total) by mouth every 8 (eight) hours as needed for moderate pain.  Dispense: 45 tablet; Refill: 0  2. Essential hypertension Elevated today, likely due to pain. Continue bp medication as prescribed. Monitor closely.   General Counseling: sequoya hogsett understanding of the findings of todays visit and agrees with plan of treatment. I have discussed any further diagnostic evaluation that may be needed or ordered today. We also reviewed her medications today. she has been encouraged to call the office with any questions or concerns that should  arise related to todays visit.  Reviewed risks and possible side effects associated with taking opiates, benzodiazepines and other  CNS depressants. Combination of these could cause dizziness and drowsiness. Advised patient not to drive or operate machinery when taking these medications, as patient's and other's life can be at risk and will have consequences. Patient verbalized understanding in this matter. Dependence and abuse for these drugs will be monitored closely. A Controlled substance policy and procedure is on file which allows [facility] to order a urine drug screen test at any visit. Patient understands and agrees with the plan  This patient was seen by Leretha Pol FNP Collaboration with Dr Lavera Guise as a part of collaborative care agreement  Meds ordered this encounter  Medications  . methylPREDNISolone (MEDROL) 4 MG TBPK tablet    Sig: Take by mouth as directed for 6 days    Dispense:  21 tablet    Refill:  0    Order Specific Question:   Supervising Provider    Answer:   Lavera Guise [place]  . traMADol (ULTRAM) 50 MG tablet    Sig: Take 1 tablet (50 mg total) by mouth every 8 (eight) hours as needed for moderate pain.    Dispense:  45 tablet    Refill:  0    Order Specific Question:   Supervising Provider    Answer:   Lavera Guise [[id number]]    Time spent: 28 Minutes      Dr Lavera Guise Internal medicine

## 2018-05-29 DIAGNOSIS — M5431 Sciatica, right side: Secondary | ICD-10-CM | POA: Insufficient documentation

## 2018-07-04 ENCOUNTER — Ambulatory Visit (INDEPENDENT_AMBULATORY_CARE_PROVIDER_SITE_OTHER): Payer: PPO | Admitting: Nurse Practitioner

## 2018-07-04 ENCOUNTER — Ambulatory Visit: Payer: Self-pay | Admitting: Nurse Practitioner

## 2018-07-04 ENCOUNTER — Other Ambulatory Visit: Payer: Self-pay

## 2018-07-04 ENCOUNTER — Encounter: Payer: Self-pay | Admitting: Nurse Practitioner

## 2018-07-04 VITALS — BP 149/79 | HR 96 | Temp 98.5°F | Resp 16 | Ht 63.0 in | Wt 206.0 lb

## 2018-07-04 DIAGNOSIS — J069 Acute upper respiratory infection, unspecified: Secondary | ICD-10-CM | POA: Insufficient documentation

## 2018-07-04 DIAGNOSIS — Z9189 Other specified personal risk factors, not elsewhere classified: Secondary | ICD-10-CM | POA: Diagnosis not present

## 2018-07-04 DIAGNOSIS — R05 Cough: Secondary | ICD-10-CM

## 2018-07-04 DIAGNOSIS — J111 Influenza due to unidentified influenza virus with other respiratory manifestations: Secondary | ICD-10-CM | POA: Insufficient documentation

## 2018-07-04 DIAGNOSIS — R059 Cough, unspecified: Secondary | ICD-10-CM | POA: Insufficient documentation

## 2018-07-04 LAB — POC INFLUENZA TEST
Negative: NEGATIVE
POSITIVE: NEGATIVE

## 2018-07-04 MED ORDER — HYDROCOD POLST-CPM POLST ER 10-8 MG/5ML PO SUER: 5.0000 mL | Freq: Two times a day (BID) | ORAL | 0 refills | Status: DC | PRN

## 2018-07-04 MED ORDER — OSELTAMIVIR PHOSPHATE 75 MG PO CAPS: 75.0000 mg | ORAL_CAPSULE | Freq: Two times a day (BID) | ORAL | 0 refills | Status: DC

## 2018-07-04 MED ORDER — AZITHROMYCIN 250 MG PO TABS: ORAL_TABLET | ORAL | 0 refills | Status: DC

## 2018-07-04 NOTE — Progress Notes (Signed)
[facility] [place], [place] [address]  Internal MEDICINE  Office Visit Note  Patient Name: Regina Deleon  [id number]  [id number]  Date of Service: 07/04/2018  Chief Complaint  Patient presents with  . Cough    cough, wheezing, no fever or chills, no bodyaches, no headaches,   . Wheezing  . Sore Throat  . Ear Problem  . Travel Consult    pt traveled to [place] February 20th and returned on yesterday     The patient states that she has had nasal congestion with headache, sore throat, and cough for the past few weeks. She states that she has also noted some wheezing, especially when she is lying down at night. Causes her to cough and keeps her awake.  She and her husband returned to the [place] yesterday. They pent 10 days abroad, in [place] and [place]. Currently these ccountires on on high alert due to new infections with COVID19.   Cough  This is a new problem. The current episode started 1 to 4 weeks ago. The problem has been unchanged. The problem occurs every few minutes. The cough is non-productive. Associated symptoms include chills, ear congestion, ear pain, a fever, headaches, myalgias, nasal congestion, postnasal drip, rhinorrhea, a sore throat and wheezing. Pertinent negatives include no chest pain. The symptoms are aggravated by exercise and lying down. She has tried OTC cough suppressant and rest for the symptoms. The treatment provided mild relief. Her past medical history is significant for environmental allergies.   Pt is here for a sick visit.     Current Medication:  Outpatient Encounter Medications as of 07/04/2018  Medication Sig  . acetaminophen (TYLENOL) 500 MG tablet Take 500 mg by mouth every 6 (six) hours as needed for mild pain.  Marland Kitchen aspirin 81 MG tablet Take 81 mg by mouth every morning.   . chlorhexidine (PERIDEX) 0.12 % solution Use as directed 10 mLs in the mouth or throat 2 (two) times daily.  . cyclobenzaprine  (FLEXERIL) 10 MG tablet TAKE 1 TABLET BY MOUTH EVERY DAY AT BEDTIME AS NEEDED FOR MUSCLE SPASM  . diclofenac sodium (VOLTAREN) 1 % GEL Apply 4 g topically 4 (four) times daily.  . fluticasone (FLONASE) 50 MCG/ACT nasal spray Place 2 sprays into both nostrils daily as needed for allergies or rhinitis.  Marland Kitchen gabapentin (NEURONTIN) 100 MG capsule Take 1 capsule (100 mg total) by mouth every evening.  Marland Kitchen letrozole (FEMARA) 2.5 MG tablet Take 1 tablet (2.5 mg total) by mouth daily.  . methylPREDNISolone (MEDROL) 4 MG TBPK tablet Take by mouth as directed for 6 days  . NEOMYCIN-POLYMYXIN-HYDROCORTISONE (CORTISPORIN) 1 % SOLN OTIC solution Place 3-4 drops in infected ear two times  daily.  Marland Kitchen NIFEdipine (PROCARDIA-XL/ADALAT-CC/NIFEDICAL-XL) 30 MG 24 hr tablet Take 1 tablet (30 mg total) by mouth 2 (two) times daily.  . Omega-3 Fatty Acids (OMEGA 3 PO) Take 2 tablets by mouth every morning.  . ranitidine (ZANTAC) 150 MG tablet Take 150 mg by mouth 2 (two) times daily.  . traMADol (ULTRAM) 50 MG tablet Take 1 tablet (50 mg total) by mouth every 8 (eight) hours as needed for moderate pain.  Marland Kitchen triamterene-hydrochlorothiazide (MAXZIDE-25) 37.5-25 MG tablet Take 1 tablet by mouth daily.  Marland Kitchen zolpidem (AMBIEN) 10 MG tablet Take 1 tablet (10 mg total) by mouth at bedtime as needed for sleep.  Marland Kitchen amoxicillin-clavulanate (AUGMENTIN) 875-125 MG tablet Take 1 tablet by mouth 2 (two) times daily. (Patient not taking: Reported on  05/20/2018)  . azithromycin (ZITHROMAX) 250 MG tablet z-pack - take as directed for 5 days  . chlorpheniramine-HYDROcodone (TUSSIONEX PENNKINETIC ER) 10-8 MG/5ML SUER Take 5 mLs by mouth every 12 (twelve) hours as needed for cough.  Marland Kitchen oseltamivir (TAMIFLU) 75 MG capsule Take 1 capsule (75 mg total) by mouth 2 (two) times daily.   No facility-administered encounter medications on file as of 07/04/2018.       Medical History: Past Medical History:  Diagnosis Date  . 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 Edema leg    with chemo  . GERD (gastroesophageal reflux disease)   . History of kidney stones   . Hyperlipidemia   . Hypertension   . Neuropathy    hands and feet post chemo  . Wears glasses      Vital Signs: BP (!) 149/79 (BP Location: Right Arm, Patient Position: Sitting, Cuff Size: Large)   Pulse 96   Temp 98.5 F (36.9 C)   Resp 16   Ht 5\' 3"  (1.6 m)   Wt 206 lb (93.4 kg)   LMP 06/02/1984   SpO2 98%   BMI 36.49 kg/m    Review of Systems  Constitutional: Positive for chills, fatigue and fever.  HENT: Positive for congestion, ear pain, postnasal drip, rhinorrhea, sinus pain, sore throat and voice change.   Respiratory: Positive for cough and wheezing.   Cardiovascular: Negative for chest pain and palpitations.  Gastrointestinal: Positive for nausea. Negative for vomiting.  Musculoskeletal: Positive for arthralgias and myalgias.  Allergic/Immunologic: Positive for environmental allergies.  Neurological: Positive for headaches.  Hematological: Positive for adenopathy.    Physical Exam Vitals signs and nursing note reviewed.  Constitutional:      General: She is not in acute distress.    Appearance: She is well-developed. She is ill-appearing. She is not diaphoretic.  HENT:     Head: Normocephalic and atraumatic.     Right Ear: Tympanic membrane is erythematous and bulging.     Left Ear: Tympanic membrane is erythematous and bulging.     Nose: Congestion present.     Mouth/Throat:     Pharynx: Posterior oropharyngeal erythema present. No oropharyngeal exudate.  Eyes:     Pupils: Pupils are equal, round, and reactive to light.  Neck:     Musculoskeletal: Normal range of motion and neck supple.     Thyroid: No thyromegaly.     Vascular: No JVD.     Trachea: No tracheal deviation.  Cardiovascular:     Rate and Rhythm: Normal rate and regular rhythm.     Heart sounds: Normal heart sounds. No murmur. No friction rub. No gallop.   Pulmonary:     Effort:  Pulmonary effort is normal. No respiratory distress.     Breath sounds: No wheezing or rales.     Comments: Mild congested breath sounds heard throughout the lung fields.  Chest:     Chest wall: No tenderness.  Abdominal:     General: Bowel sounds are normal.     Palpations: Abdomen is soft.  Musculoskeletal: Normal range of motion.  Lymphadenopathy:     Cervical: Cervical adenopathy present.  Skin:    General: Skin is warm and dry.  Neurological:     Mental Status: She is alert and oriented to person, place, and time.     Cranial Nerves: No cranial nerve deficit.  Psychiatric:        Behavior: Behavior normal.  Thought Content: Thought content normal.        Judgment: Judgment normal.   Assessment/Plan: 1. Acute upper respiratory infection Treat with z-pack. Take as directed for 5 days. Rest and increase fluids. Continue to take OTC medications to improve acute symptoms  - azithromycin (ZITHROMAX) 250 MG tablet; z-pack - take as directed for 5 days  Dispense: 6 tablet; Refill: 0  2. Influenza Flue test negative, but high risk for flu. Treat with tamiflu 75mg  bid for 5 days. - oseltamivir (TAMIFLU) 75 MG capsule; Take 1 capsule (75 mg total) by mouth 2 (two) times daily.  Dispense: 10 capsule; Refill: 0  3. Cough May take tussionex bid as needed for cough.  Advised patient not to overuse this medicine and not to mix with other medications or alcohol as it can cause respiratory distress, sleepiness or dizziness. Should also avoid driving. Patient voiced understanding and agreement.  - chlorpheniramine-HYDROcodone (TUSSIONEX PENNKINETIC ER) 10-8 MG/5ML SUER; Take 5 mLs by mouth every 12 (twelve) hours as needed for cough.  Dispense: 115 mL; Refill: 0  4. At risk for infectious disease due to recent foreign travel - POC INFLUENZA TEST  General Counseling: makaila windle understanding of the findings of todays visit and agrees with plan of treatment. I have discussed any  further diagnostic evaluation that may be needed or ordered today. We also reviewed her medications today. she has been encouraged to call the office with any questions or concerns that should arise related to todays visit.    Counseling:  Rest and increase fluids. Continue using OTC medication to control symptoms.   This patient was seen by Leretha Pol FNP Collaboration with Dr Lavera Guise as a part of collaborative care agreement  Orders Placed This Encounter  Procedures  . POC INFLUENZA TEST    Meds ordered this encounter  Medications  . oseltamivir (TAMIFLU) 75 MG capsule    Sig: Take 1 capsule (75 mg total) by mouth 2 (two) times daily.    Dispense:  10 capsule    Refill:  0    Order Specific Question:   Supervising Provider    Answer:   Lavera Guise [[id number]]  . azithromycin (ZITHROMAX) 250 MG tablet    Sig: z-pack - take as directed for 5 days    Dispense:  6 tablet    Refill:  0    Order Specific Question:   Supervising Provider    Answer:   Lavera Guise [place]  . chlorpheniramine-HYDROcodone (TUSSIONEX PENNKINETIC ER) 10-8 MG/5ML SUER    Sig: Take 5 mLs by mouth every 12 (twelve) hours as needed for cough.    Dispense:  115 mL    Refill:  0    Order Specific Question:   Supervising Provider    Answer:   Lavera Guise [[id number]]    Time spent: 25 Minutes

## 2018-07-06 ENCOUNTER — Telehealth: Payer: Self-pay

## 2018-07-06 NOTE — Telephone Encounter (Signed)
That's great! Thank you.

## 2018-07-11 ENCOUNTER — Other Ambulatory Visit: Payer: Self-pay | Admitting: Nurse Practitioner

## 2018-07-11 ENCOUNTER — Telehealth: Payer: Self-pay

## 2018-07-11 DIAGNOSIS — J069 Acute upper respiratory infection, unspecified: Secondary | ICD-10-CM

## 2018-07-11 MED ORDER — LEVOFLOXACIN 500 MG PO TABS: 500.0000 mg | ORAL_TABLET | Freq: Every day | ORAL | 0 refills | Status: DC

## 2018-07-11 NOTE — Telephone Encounter (Signed)
Still feeling bad after round z-pack. Will do round levofloxacin 500mg  daily for 10 days. New rx sent to pharmacy. May use cough suppressant already prescribed. Also sent prescription for this cough suppressant for her husband.

## 2018-07-11 NOTE — Progress Notes (Signed)
Still feeling bad after round z-pack. Will do round levofloxacin 500mg  daily for 10 days. New rx sent to pharmacy. May use cough suppressant already prescribed. Also sent prescription for this cough suppressant for her husband.

## 2018-07-11 NOTE — Telephone Encounter (Signed)
Pt advised we send  med to phar  

## 2018-07-19 ENCOUNTER — Encounter: Payer: Self-pay | Admitting: Nurse Practitioner

## 2018-07-19 ENCOUNTER — Ambulatory Visit (INDEPENDENT_AMBULATORY_CARE_PROVIDER_SITE_OTHER): Payer: PPO | Admitting: Nurse Practitioner

## 2018-07-19 ENCOUNTER — Other Ambulatory Visit: Payer: Self-pay

## 2018-07-19 VITALS — BP 133/70 | HR 87 | Temp 96.7°F | Resp 16 | Ht 63.0 in | Wt 202.0 lb

## 2018-07-19 DIAGNOSIS — F5101 Primary insomnia: Secondary | ICD-10-CM

## 2018-07-19 DIAGNOSIS — M5431 Sciatica, right side: Secondary | ICD-10-CM | POA: Diagnosis not present

## 2018-07-19 DIAGNOSIS — H60502 Unspecified acute noninfective otitis externa, left ear: Secondary | ICD-10-CM

## 2018-07-19 DIAGNOSIS — I10 Essential (primary) hypertension: Secondary | ICD-10-CM | POA: Diagnosis not present

## 2018-07-19 MED ORDER — ZOLPIDEM TARTRATE 10 MG PO TABS: 10.0000 mg | ORAL_TABLET | Freq: Every evening | ORAL | 3 refills | Status: DC | PRN

## 2018-07-19 MED ORDER — CIPROFLOXACIN-HYDROCORTISONE 0.2-1 % OT SUSP: 3.0000 [drp] | Freq: Two times a day (BID) | OTIC | 0 refills | Status: DC

## 2018-07-19 MED ORDER — CIPROFLOXACIN-DEXAMETHASONE 0.3-0.1 % OT SUSP: 4.0000 [drp] | Freq: Two times a day (BID) | OTIC | 0 refills | Status: DC

## 2018-07-19 MED ORDER — METHYLPREDNISOLONE 4 MG PO TBPK: ORAL_TABLET | ORAL | 0 refills | Status: DC

## 2018-07-19 NOTE — Progress Notes (Signed)
[facility] [place], [place] [address]  Internal MEDICINE  Office Visit Note  Patient Name: Regina Deleon  [id number]  [id number]  Date of Service: 08/04/2018  Chief Complaint  Patient presents with  . Medical Management of Chronic Issues    2 month follow up, medication refills, pt have questions about taking two medications together   . 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 Sore Throat    pt stated that her throat has been a little scratchy not sure if its due that ear pain,     The patient is complaining of left shoulder pain. 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 Had to take pain medication and physical therapy. Does have some previously prescribed tramadol and flexeril which she wants to know if she can take. Range of motion and strength in the left shoulder is very limited due to pain.  Left ear is also tender. Was treated for upper respiratory infection, twice, over the past two weeks. Feels much better. No long has fever or headaches. Has just scratchy throat and left ear pain.  Blood pressure is well managed on current medication. She does need refill for her ambien.       Current Medication: Outpatient Encounter Medications as of 07/19/2018  Medication Sig  . acetaminophen (TYLENOL) 500 MG tablet Take 500 mg by mouth every 6 (six) hours as needed for mild pain.  Marland Kitchen aspirin 81 MG tablet Take 81 mg by mouth every morning.   . chlorhexidine (PERIDEX) 0.12 % solution Use as directed 10 mLs in the mouth or throat 2 (two) times daily.  . chlorpheniramine-HYDROcodone (TUSSIONEX PENNKINETIC ER) 10-8 MG/5ML SUER Take 5 mLs by mouth every 12 (twelve) hours as needed for cough.  . cyclobenzaprine (FLEXERIL) 10 MG tablet TAKE 1 TABLET BY MOUTH EVERY DAY AT BEDTIME AS NEEDED FOR MUSCLE SPASM  . diclofenac sodium (VOLTAREN) 1 % GEL Apply 4 g topically 4 (four) times  daily.  . fluticasone (FLONASE) 50 MCG/ACT nasal spray Place 2 sprays into both nostrils daily as needed for allergies or rhinitis.  Marland Kitchen gabapentin (NEURONTIN) 100 MG capsule Take 1 capsule (100 mg total) by mouth every evening.  Marland Kitchen letrozole (FEMARA) 2.5 MG tablet Take 1 tablet (2.5 mg total) by mouth daily.  . methylPREDNISolone (MEDROL) 4 MG TBPK tablet Take by mouth as directed for 6 days  . NIFEdipine (PROCARDIA-XL/ADALAT-CC/NIFEDICAL-XL) 30 MG 24 hr tablet Take 1 tablet (30 mg total) by mouth 2 (two) times daily.  . Omega-3 Fatty Acids (OMEGA 3 PO) Take 2 tablets by mouth every morning.  Marland Kitchen oseltamivir (TAMIFLU) 75 MG capsule Take 1 capsule (75 mg total) by mouth 2 (two) times daily.  . ranitidine (ZANTAC) 150 MG tablet Take 150 mg by mouth 2 (two) times daily.  . traMADol (ULTRAM) 50 MG tablet Take 1 tablet (50 mg total) by mouth every 8 (eight) hours as needed for moderate pain.  Marland Kitchen triamterene-hydrochlorothiazide (MAXZIDE-25) 37.5-25 MG tablet Take 1 tablet by mouth daily.  Marland Kitchen zolpidem (AMBIEN) 10 MG tablet Take 1 tablet (10 mg total) by mouth at bedtime as needed for sleep.  . [DISCONTINUED] azithromycin (ZITHROMAX) 250 MG tablet z-pack - take as directed for 5 days  . [DISCONTINUED] levofloxacin (LEVAQUIN) 500 MG tablet Take 1 tablet (500 mg total) by mouth daily.  . [DISCONTINUED] methylPREDNISolone (MEDROL) 4  MG TBPK tablet Take by mouth as directed for 6 days  . [DISCONTINUED] NEOMYCIN-POLYMYXIN-HYDROCORTISONE (CORTISPORIN) 1 % SOLN OTIC solution Place 3-4 drops in infected ear two times  daily.  . [DISCONTINUED] zolpidem (AMBIEN) 10 MG tablet Take 1 tablet (10 mg total) by mouth at bedtime as needed for sleep.  . ciprofloxacin-hydrocortisone (CIPRO HC) OTIC suspension Place 3 drops into the left ear 2 (two) times daily. (Patient not taking: Reported on 07/19/2018)  . [DISCONTINUED] amoxicillin-clavulanate (AUGMENTIN) 875-125 MG tablet Take 1 tablet by mouth 2 (two) times daily. (Patient not  taking: Reported on 05/20/2018)   No facility-administered encounter medications on file as of 07/19/2018.     Surgical History: Past Surgical History:  Procedure Laterality Date  . ABDOMINAL HYSTERECTOMY  1986  . AXILLARY LYMPH NODE DISSECTION Left 06/26/2014   Procedure: AXILLARY LYMPH NODE DISSECTION;  Surgeon: Alphonsa Overall, MD;  Location: [facility];  Service: General;  Laterality: Left;  . [place]   lumbar  . BREAST SURGERY Left 06/2014  . COLONOSCOPY    . COLONOSCOPY WITH PROPOFOL N/A 12/29/2017   Procedure: COLONOSCOPY WITH PROPOFOL;  Surgeon: Toledo, Benay Pike, MD;  Location: [facility];  Service: Gastroenterology;  Laterality: N/A;  . DILATION AND CURETTAGE OF UTERUS    . LAPAROSCOPIC GASTRIC BANDING  10/19/05  . MASTECTOMY Left   . MASTECTOMY MODIFIED RADICAL Left 06/26/2014   Procedure: MASTECTOMY MODIFIED RADICAL;  Surgeon: Alphonsa Overall, MD;  Location: [facility];  Service: General;  Laterality: Left;  . PORT A CATH REVISION     insertion-10/15  . PORT-A-CATH REMOVAL N/A 02/07/2015   Procedure: MINOR REMOVAL PORT-A-CATH;  Surgeon: Alphonsa Overall, MD;  Location: [facility];  Service: General;  Laterality: N/A;    Medical History: Past Medical History:  Diagnosis Date  . Cancer ([place])    lt br cancer  . Edema leg    with chemo  . GERD (gastroesophageal reflux disease)   . History of kidney stones   . Hyperlipidemia   . Hypertension   . Neuropathy    hands and feet post chemo  . Wears glasses     Family History: Family History  Problem Relation Age of Onset  . Asthma Mother   . Diabetes Mother   . Diabetes Father   . Heart disease Father   . Cancer Maternal Aunt   . Cancer Cousin   . Cancer Cousin   . Cancer Cousin     Social History   Socioeconomic History  . Marital status: Married    Spouse name: Not on file  . Number of children: Not on file  . Years of education: Not on file  .  Highest education level: Not on file  Occupational History  . Not on file  Social Needs  . Financial resource strain: Not on file  . Food insecurity:    Worry: Not on file    Inability: Not on file  . Transportation needs:    Medical: Not on file    Non-medical: Not on file  Tobacco Use  . Smoking status: Never Smoker  . Smokeless tobacco: Never Used  Substance and Sexual Activity  . Alcohol use: No  . Drug use: No  . Sexual activity: Yes  Lifestyle  . Physical activity:    Days per week: Not on file    Minutes per session: Not on file  . Stress: Not on file  Relationships  . Social connections:  Talks on phone: Not on file    Gets together: Not on file    Attends religious service: Not on file    Active member of club or organization: Not on file    Attends meetings of clubs or organizations: Not on file    Relationship status: Not on file  . Intimate partner violence:    Fear of current or ex partner: Not on file    Emotionally abused: Not on file    Physically abused: Not on file    Forced sexual activity: Not on file  Other Topics Concern  . Not on file  Social History Narrative  . Not on file      Review of Systems  Constitutional: Negative for chills, fatigue and unexpected weight change.  HENT: Positive for postnasal drip. Negative for congestion, rhinorrhea, sneezing and sore throat.   Eyes: Negative for redness.  Respiratory: Negative for cough, chest tightness and shortness of breath.   Cardiovascular: Negative for chest pain and palpitations.  Gastrointestinal: Negative for abdominal pain, constipation, diarrhea, nausea and vomiting.  Genitourinary: Negative for dysuria and frequency.  Musculoskeletal: Negative for arthralgias, back pain, joint swelling and neck pain.  Skin: Negative for rash.  Neurological: Negative.  Negative for tremors and numbness.  Hematological: Negative for adenopathy. Does not bruise/bleed easily.  Psychiatric/Behavioral:  Negative for behavioral problems (Depression), sleep disturbance and suicidal ideas. The patient is not nervous/anxious.     Today's Vitals   07/19/18 1001  BP: 133/70  Pulse: 87  Resp: 16  Temp: (!) 96.7 F (35.9 C)  SpO2: 95%  Weight: 202 lb (91.6 kg)  Height: 5\' 3"  (1.6 m)   Body mass index is 35.78 kg/m.  Physical Exam Vitals signs and nursing note reviewed.  Constitutional:      Appearance: Normal appearance. She is well-developed. She is not diaphoretic.  HENT:     Head: Normocephalic and atraumatic.     Right Ear: Tympanic membrane is bulging.     Left Ear: Tenderness present. Tympanic membrane is erythematous and bulging.     Nose: Congestion present.     Mouth/Throat:     Pharynx: No oropharyngeal exudate.  Eyes:     Pupils: Pupils are equal, round, and reactive to light.  Neck:     Musculoskeletal: Normal range of motion and neck supple.     Thyroid: No thyromegaly.     Vascular: No JVD.     Trachea: No tracheal deviation.  Cardiovascular:     Rate and Rhythm: Normal rate and regular rhythm.     Heart sounds: Normal heart sounds. No murmur. No friction rub. No gallop.   Pulmonary:     Effort: Pulmonary effort is normal. No respiratory distress.     Breath sounds: Normal breath sounds. No wheezing or rales.  Chest:     Chest wall: No tenderness.  Abdominal:     General: Bowel sounds are normal.     Palpations: Abdomen is soft.  Musculoskeletal: Normal range of motion.  Lymphadenopathy:     Cervical: No cervical adenopathy.  Skin:    General: Skin is warm and dry.  Neurological:     Mental Status: She is alert and oriented to person, place, and time.     Cranial Nerves: No cranial nerve deficit.  Psychiatric:        Behavior: Behavior normal.        Thought Content: Thought content normal.        Judgment: Judgment normal.  Assessment/Plan: 1. Essential hypertension Stable. Continue bp medication as prescribed   2. Acute otitis externa of left  ear, unspecified type ciprodex ear drops. Use fout drops in the left ear twice daily for next seven to ten days.  - ciprofloxacin-hydrocortisone (CIPRO HC) OTIC suspension; Place 3 drops into the left ear 2 (two) times daily. (Patient not taking: Reported on 07/19/2018)  Dispense: 10 mL; Refill: 0  3. Right sided sciatica Improved but not resolved. Add medrol dose pack. Take as directed for 6 days. Continue to take NSAIDs and tramadol as previously prescribed. Apply heating pad to the lower back as needed  - methylPREDNISolone (MEDROL) 4 MG TBPK tablet; Take by mouth as directed for 6 days  Dispense: 21 tablet; Refill: 0  4. Primary insomnia May continue ambien as needed for acute insomnia. New prescription sent to her pharmacy today.  - zolpidem (AMBIEN) 10 MG tablet; Take 1 tablet (10 mg total) by mouth at bedtime as needed for sleep.  Dispense: 30 tablet; Refill: 3  General Counseling: Regina Deleon verbalizes understanding of the findings of todays visit and agrees with plan of treatment. I have discussed any further diagnostic evaluation that may be needed or ordered today. We also reviewed her medications today. she has been encouraged to call the office with any questions or concerns that should arise related to todays visit.  Hypertension Counseling:   The following hypertensive lifestyle modification were recommended and discussed:  1. Limiting alcohol intake to less than 1 oz/day of ethanol:(24 oz of beer or 8 oz of wine or 2 oz of 100-proof whiskey). 2. Take baby ASA 81 mg daily. 3. Importance of regular aerobic exercise and losing weight. 4. Reduce dietary saturated fat and cholesterol intake for overall cardiovascular health. 5. Maintaining adequate dietary potassium, calcium, and magnesium intake. 6. Regular monitoring of the blood pressure. 7. Reduce sodium intake to less than 100 mmol/day (less than 2.3 gm of sodium or less than 6 gm of sodium choride)   This patient was seen by  [place] with Dr Lavera Guise as a part of collaborative care agreement  Meds ordered this encounter  Medications  . methylPREDNISolone (MEDROL) 4 MG TBPK tablet    Sig: Take by mouth as directed for 6 days    Dispense:  21 tablet    Refill:  0    Order Specific Question:   Supervising Provider    Answer:   Lavera Guise [place]  . ciprofloxacin-hydrocortisone (CIPRO HC) OTIC suspension    Sig: Place 3 drops into the left ear 2 (two) times daily.    Dispense:  10 mL    Refill:  0    Ok to use OP solution if insurance prefers    Order Specific Question:   Supervising Provider    Answer:   Lavera Guise [[id number]]  . zolpidem (AMBIEN) 10 MG tablet    Sig: Take 1 tablet (10 mg total) by mouth at bedtime as needed for sleep.    Dispense:  30 tablet    Refill:  3    Order Specific Question:   Supervising Provider    Answer:   Lavera Guise [[id number]]    Time spent: 55 Minutes      Dr Lavera Guise Internal medicine

## 2018-08-04 DIAGNOSIS — H60502 Unspecified acute noninfective otitis externa, left ear: Secondary | ICD-10-CM | POA: Insufficient documentation

## 2018-10-12 ENCOUNTER — Other Ambulatory Visit: Payer: Self-pay

## 2018-10-12 DIAGNOSIS — I10 Essential (primary) hypertension: Secondary | ICD-10-CM

## 2018-10-12 MED ORDER — NIFEDIPINE ER OSMOTIC RELEASE 30 MG PO TB24: 30.0000 mg | ORAL_TABLET | Freq: Two times a day (BID) | ORAL | 0 refills | Status: DC

## 2018-10-17 ENCOUNTER — Other Ambulatory Visit: Payer: Self-pay

## 2018-10-17 DIAGNOSIS — G620 Drug-induced polyneuropathy: Secondary | ICD-10-CM

## 2018-10-17 MED ORDER — GABAPENTIN 100 MG PO CAPS: 100.0000 mg | ORAL_CAPSULE | Freq: Every evening | ORAL | 0 refills | Status: DC

## 2018-11-29 ENCOUNTER — Encounter: Payer: Self-pay | Admitting: Nurse Practitioner

## 2018-11-29 ENCOUNTER — Ambulatory Visit (INDEPENDENT_AMBULATORY_CARE_PROVIDER_SITE_OTHER): Payer: PPO | Admitting: Nurse Practitioner

## 2018-11-29 ENCOUNTER — Other Ambulatory Visit: Payer: Self-pay

## 2018-11-29 VITALS — BP 147/72 | HR 92 | Resp 16 | Ht 63.0 in | Wt 203.0 lb

## 2018-11-29 DIAGNOSIS — N390 Urinary tract infection, site not specified: Secondary | ICD-10-CM

## 2018-11-29 DIAGNOSIS — F5101 Primary insomnia: Secondary | ICD-10-CM

## 2018-11-29 DIAGNOSIS — I10 Essential (primary) hypertension: Secondary | ICD-10-CM

## 2018-11-29 DIAGNOSIS — M545 Low back pain, unspecified: Secondary | ICD-10-CM

## 2018-11-29 DIAGNOSIS — Z0001 Encounter for general adult medical examination with abnormal findings: Secondary | ICD-10-CM

## 2018-11-29 DIAGNOSIS — R300 Dysuria: Secondary | ICD-10-CM

## 2018-11-29 DIAGNOSIS — T451X5A Adverse effect of antineoplastic and immunosuppressive drugs, initial encounter: Secondary | ICD-10-CM

## 2018-11-29 DIAGNOSIS — G620 Drug-induced polyneuropathy: Secondary | ICD-10-CM

## 2018-11-29 LAB — POCT URINALYSIS DIPSTICK
Bilirubin, UA: NEGATIVE
Blood, UA: NEGATIVE
Glucose, UA: NEGATIVE
Ketones, UA: NEGATIVE
Nitrite, UA: NEGATIVE
Protein, UA: POSITIVE — AB
Spec Grav, UA: <=1.005 — AB (ref 1.010–1.025)
Urobilinogen, UA: 0.2 E.U./dL
pH, UA: 8 (ref 5.0–8.0)

## 2018-11-29 MED ORDER — CYCLOBENZAPRINE HCL 10 MG PO TABS: 10.0000 mg | ORAL_TABLET | Freq: Three times a day (TID) | ORAL | 3 refills | Status: DC | PRN

## 2018-11-29 MED ORDER — TRIAMTERENE-HCTZ 37.5-25 MG PO TABS: 1.0000 | ORAL_TABLET | Freq: Every day | ORAL | 5 refills | Status: DC

## 2018-11-29 MED ORDER — GABAPENTIN 100 MG PO CAPS: ORAL_CAPSULE | ORAL | 3 refills | Status: DC

## 2018-11-29 MED ORDER — ZOLPIDEM TARTRATE 10 MG PO TABS: 10.0000 mg | ORAL_TABLET | Freq: Every evening | ORAL | 3 refills | Status: DC | PRN

## 2018-11-29 NOTE — Progress Notes (Signed)
[facility] [place], [place] [address]  Internal MEDICINE  Office Visit Note  Patient Name: Regina Deleon  [id number]  [id number]  Date of Service: 12/11/2018   Pt is here for routine health maintenance examination  Chief Complaint  Patient presents with  . Annual Exam  . Hyperlipidemia  . Hypertension  . Gastroesophageal Reflux  . Peripheral Neuropathy    right foot is giving pt issues  . Pain    left lower abdominal pain  . Medication Refill    Lorrin Mais, maxide, flexeril     The patient is here for health maintenance exam. She is complaining of lower back pain. Feels as though she may have uti. This has been going on for past few days. This pain is different than usual muscle spasms that she has due to exertion.  She ias also c/o feelings of coldness and numbness in the right lower leg.  Feels like she has her right foot in a bucket of ice. This is most severe at night when she is trying to rest. Was started on gabapentin 100mg  at bedtime. She states that this does help some. Sometimes she takes two which helps even more. During the day, she has started noticing the feelings in her legs during the days as well.     Current Medication: Outpatient Encounter Medications as of 11/29/2018  Medication Sig  . acetaminophen (TYLENOL) 500 MG tablet Take 500 mg by mouth every 6 (six) hours as needed for mild pain.  Marland Kitchen aspirin 81 MG tablet Take 81 mg by mouth every morning.   . cyclobenzaprine (FLEXERIL) 10 MG tablet Take 1 tablet (10 mg total) by mouth 3 (three) times daily as needed for muscle spasms.  . diclofenac sodium (VOLTAREN) 1 % GEL Apply 4 g topically 4 (four) times daily.  Marland Kitchen gabapentin (NEURONTIN) 100 MG capsule Take 1 capsule po qam and two capsules po QPM  . NIFEdipine (PROCARDIA-XL/NIFEDICAL-XL) 30 MG 24 hr tablet Take 1 tablet (30 mg total) by mouth 2 (two) times daily.  . Omega-3 Fatty Acids (OMEGA 3 PO) Take 2 tablets by mouth every morning.   . traMADol (ULTRAM) 50 MG tablet Take 1 tablet (50 mg total) by mouth every 8 (eight) hours as needed for moderate pain.  Marland Kitchen triamterene-hydrochlorothiazide (MAXZIDE-25) 37.5-25 MG tablet Take 1 tablet by mouth daily.  Marland Kitchen zolpidem (AMBIEN) 10 MG tablet Take 1 tablet (10 mg total) by mouth at bedtime as needed for sleep.  . [DISCONTINUED] cyclobenzaprine (FLEXERIL) 10 MG tablet TAKE 1 TABLET BY MOUTH EVERY DAY AT BEDTIME AS NEEDED FOR MUSCLE SPASM  . [DISCONTINUED] gabapentin (NEURONTIN) 100 MG capsule Take 1 capsule (100 mg total) by mouth every evening.  . [DISCONTINUED] triamterene-hydrochlorothiazide (MAXZIDE-25) 37.5-25 MG tablet Take 1 tablet by mouth daily.  . [DISCONTINUED] zolpidem (AMBIEN) 10 MG tablet Take 1 tablet (10 mg total) by mouth at bedtime as needed for sleep.  Marland Kitchen sulfamethoxazole-trimethoprim (BACTRIM DS) 800-160 MG tablet Take 1 tablet by mouth 2 (two) times daily.  . [DISCONTINUED] chlorhexidine (PERIDEX) 0.12 % solution Use as directed 10 mLs in the mouth or throat 2 (two) times daily. (Patient not taking: Reported on 11/29/2018)  . [DISCONTINUED] chlorpheniramine-HYDROcodone (TUSSIONEX PENNKINETIC ER) 10-8 MG/5ML SUER Take 5 mLs by mouth every 12 (twelve) hours as needed for cough. (Patient not taking: Reported on 11/29/2018)  . [DISCONTINUED] ciprofloxacin-dexamethasone (CIPRODEX) OTIC suspension Place 4 drops into both ears 2 (two) times daily. (Patient not taking: Reported on 11/29/2018)  . [  DISCONTINUED] ciprofloxacin-hydrocortisone (CIPRO HC) OTIC suspension Place 3 drops into the left ear 2 (two) times daily. (Patient not taking: Reported on 07/19/2018)  . [DISCONTINUED] fluticasone (FLONASE) 50 MCG/ACT nasal spray Place 2 sprays into both nostrils daily as needed for allergies or rhinitis.  . [DISCONTINUED] letrozole (FEMARA) 2.5 MG tablet Take 1 tablet (2.5 mg total) by mouth daily. (Patient not taking: Reported on 11/29/2018)  . [DISCONTINUED] methylPREDNISolone (MEDROL) 4 MG  TBPK tablet Take by mouth as directed for 6 days (Patient not taking: Reported on 11/29/2018)  . [DISCONTINUED] oseltamivir (TAMIFLU) 75 MG capsule Take 1 capsule (75 mg total) by mouth 2 (two) times daily. (Patient not taking: Reported on 11/29/2018)  . [DISCONTINUED] ranitidine (ZANTAC) 150 MG tablet Take 150 mg by mouth 2 (two) times daily.   No facility-administered encounter medications on file as of 11/29/2018.     Surgical History: Past Surgical History:  Procedure Laterality Date  . ABDOMINAL HYSTERECTOMY  1986  . AXILLARY LYMPH NODE DISSECTION Left 06/26/2014   Procedure: AXILLARY LYMPH NODE DISSECTION;  Surgeon: Alphonsa Overall, MD;  Location: [place];  Service: General;  Laterality: Left;  . [place]   lumbar  . BREAST SURGERY Left 06/2014  . COLONOSCOPY    . COLONOSCOPY WITH PROPOFOL N/A 12/29/2017   Procedure: COLONOSCOPY WITH PROPOFOL;  Surgeon: Tol[place], Benay Pike, MD;  Location: [facility];  Service: Gastroenterology;  Laterality: N/A;  . DILATION AND CURETTAGE OF UTERUS    . LAPAROSCOPIC GASTRIC BANDING  10/19/05  . MASTECTOMY Left   . MASTECTOMY MODIFIED RADICAL Left 06/26/2014   Procedure: MASTECTOMY MODIFIED RADICAL;  Surgeon: Alphonsa Overall, MD;  Location: [place];  Service: General;  Laterality: Left;  . PORT A CATH REVISION     insertion-10/15  . PORT-A-CATH REMOVAL N/A 02/07/2015   Procedure: MINOR REMOVAL PORT-A-CATH;  Surgeon: Alphonsa Overall, MD;  Location: [place];  Service: General;  Laterality: N/A;    Medical History: Past Medical History:  Diagnosis Date  . Cancer ([place])    lt br cancer  . Edema leg    with chemo  . GERD (gastroesophageal reflux disease)   . History of kidney stones   . Hyperlipidemia   . Hypertension   . Neuropathy    hands and feet post chemo  . Wears glasses     Family History: Family History  Problem Relation Age of Onset  . Asthma Mother   . Diabetes Mother    . Diabetes Father   . Heart disease Father   . Cancer Maternal Aunt   . Cancer Cousin   . Cancer Cousin   . Cancer Cousin       Review of Systems  Constitutional: Positive for fatigue. Negative for chills and unexpected weight change.  HENT: Negative for congestion, postnasal drip, rhinorrhea, sneezing and sore throat.   Respiratory: Negative for cough, chest tightness, shortness of breath and wheezing.   Cardiovascular: Negative for chest pain and palpitations.  Gastrointestinal: Negative for abdominal pain, constipation, diarrhea, nausea and vomiting.  Endocrine: Negative for cold intolerance, heat intolerance and polydipsia.  Genitourinary: Positive for flank pain, frequency and urgency. Negative for dysuria.  Musculoskeletal: Positive for back pain. Negative for arthralgias, joint swelling and neck pain.  Skin: Negative for rash.  Allergic/Immunologic: Negative for environmental allergies.  Neurological: Negative for dizziness, tremors, numbness and headaches.  Hematological: Negative for adenopathy. Does not bruise/bleed easily.  Psychiatric/Behavioral: Positive for sleep disturbance. Negative for  behavioral problems (Depression) and suicidal ideas. The patient is not nervous/anxious.      Today's Vitals   11/29/18 1129  BP: (!) 147/72  Pulse: 92  Resp: 16  SpO2: 96%  Weight: 203 lb (92.1 kg)  Height: 5\' 3"  (1.6 m)   Body mass index is 35.96 kg/m.  Physical Exam Vitals signs and nursing note reviewed.  Constitutional:      General: She is not in acute distress.    Appearance: She is well-developed. She is not diaphoretic.  HENT:     Head: Normocephalic and atraumatic.     Mouth/Throat:     Pharynx: No oropharyngeal exudate.  Eyes:     Pupils: Pupils are equal, round, and reactive to light.  Neck:     Musculoskeletal: Normal range of motion and neck supple.     Thyroid: No thyromegaly.     Vascular: No carotid bruit or JVD.     Trachea: No tracheal  deviation.  Cardiovascular:     Rate and Rhythm: Normal rate and regular rhythm.     Pulses: Normal pulses.     Heart sounds: Normal heart sounds. No murmur. No friction rub. No gallop.   Pulmonary:     Effort: Pulmonary effort is normal. No respiratory distress.     Breath sounds: Normal breath sounds. No wheezing or rales.  Chest:     Chest wall: No tenderness.  Abdominal:     General: Bowel sounds are normal.     Palpations: Abdomen is soft.     Tenderness: There is no abdominal tenderness.  Genitourinary:    Comments: Urine sample positive for protein and moderate white blood cells.  Musculoskeletal: Normal range of motion.  Lymphadenopathy:     Cervical: No cervical adenopathy.  Skin:    General: Skin is warm and dry.  Neurological:     Mental Status: She is alert and oriented to person, place, and time.     Cranial Nerves: No cranial nerve deficit.  Psychiatric:        Behavior: Behavior normal.        Thought Content: Thought content normal.        Judgment: Judgment normal.    Depression screen [facility] 2/9 11/29/2018 11/29/2018 07/19/2018 07/04/2018 05/20/2018  Decreased Interest 0 0 0 0 0  Down, Depressed, Hopeless 0 0 0 0 0  PHQ - 2 Score 0 0 0 0 0    Functional Status Survey: Is the patient deaf or have difficulty hearing?: No Does the patient have difficulty seeing, even when wearing glasses/contacts?: No Does the patient have difficulty concentrating, remembering, or making decisions?: No Does the patient have difficulty walking or climbing stairs?: No Does the patient have difficulty dressing or bathing?: No Does the patient have difficulty doing errands alone such as visiting a doctor's office or shopping?: No  MMSE - [place] Exam 11/29/2018 10/14/2017  Orientation to time 5 5  Orientation to Place 5 5  Registration 3 3  Attention/ Calculation 5 5  Recall 3 3  Language- name 2 objects 2 2  Language- repeat 1 1  Language- follow 3 step command 3 3   Language- read & follow direction 1 1  Write a sentence 1 1  Copy design 1 1  Total score 30 30    Fall Risk  11/29/2018 11/29/2018 07/19/2018 07/04/2018 05/20/2018  Falls in the past year? 0 0 0 0 0  Number falls in past yr: - - - - -  Injury with Fall? - - - - -      LABS: Recent Results (from the past 2160 hour(s))  CULTURE, URINE COMPREHENSIVE     Status: None   Collection Time: 11/29/18 12:30 AM   Specimen: Urine   URINE  Result Value Ref Range   Urine Culture, Comprehensive Final report    Organism ID, Bacteria Comment     Comment: Mixed urogenital flora 50,000-100,000 colony forming units per mL   POCT Urinalysis Dipstick     Status: Abnormal   Collection Time: 11/29/18 12:31 PM  Result Value Ref Range   Color, UA     Clarity, UA     Glucose, UA Negative Negative   Bilirubin, UA negative    Ketones, UA negative    Spec Grav, UA <=1.005 (A) 1.010 - 1.025   Blood, UA negative    pH, UA 8.0 5.0 - 8.0   Protein, UA Positive (A) Negative   Urobilinogen, UA 0.2 0.2 or 1.0 E.U./dL   Nitrite, UA negative    Leukocytes, UA Moderate (2+) (A) Negative   Appearance     Odor     Assessment/Plan: 1. Encounter for general adult medical examination with abnormal findings Annual health maintenance exam  2. Urinary tract infection without hematuria, site unspecified Start bactrim ds bid for 10 days. Send sample for culture and sensitivity and adjust antibiotics as indicated.  - sulfamethoxazole-trimethoprim (BACTRIM DS) 800-160 MG tablet; Take 1 tablet by mouth 2 (two) times daily.  Dispense: 20 tablet; Refill: 0  3. Low back pain, unspecified back pain laterality, unspecified chronicity, unspecified whether sciatica present May take flexeril up to three times daily, though recommended she take only at night. She agrees to take only if able to stay home and no driving is needed.  - cyclobenzaprine (FLEXERIL) 10 MG tablet; Take 1 tablet (10 mg total) by mouth 3 (three) times  daily as needed for muscle spasms.  Dispense: 30 tablet; Refill: 3  4. Neuropathy due to chemotherapeutic drug (HCC) Gabapentin increased per oncology. Take one in AM and 2 in PM  - gabapentin (NEURONTIN) 100 MG capsule; Take 1 capsule po qam and two capsules po QPM  Dispense: 90 capsule; Refill: 3  5. Primary insomnia May continue to take ambien 10mg  at bedtime as needed for acute insomnia. New prescription given today.  - zolpidem (AMBIEN) 10 MG tablet; Take 1 tablet (10 mg total) by mouth at bedtime as needed for sleep.  Dispense: 30 tablet; Refill: 3  6. Dysuria - CULTURE, URINE COMPREHENSIVE - POCT Urinalysis Dipstick  7. Essential hypertension Generally stable. Continue bp medication as prescribed   General Counseling: laquenta whitsell understanding of the findings of todays visit and agrees with plan of treatment. I have discussed any further diagnostic evaluation that may be needed or ordered today. We also reviewed her medications today. she has been encouraged to call the office with any questions or concerns that should arise related to todays visit.    Counseling:  Hypertension Counseling:   The following hypertensive lifestyle modification were recommended and discussed:  1. Limiting alcohol intake to less than 1 oz/day of ethanol:(24 oz of beer or 8 oz of wine or 2 oz of 100-proof whiskey). 2. Take baby ASA 81 mg daily. 3. Importance of regular aerobic exercise and losing weight. 4. Reduce dietary saturated fat and cholesterol intake for overall cardiovascular health. 5. Maintaining adequate dietary potassium, calcium, and magnesium intake. 6. Regular monitoring of the blood pressure. 7. Reduce  sodium intake to less than 100 mmol/day (less than 2.3 gm of sodium or less than 6 gm of sodium choride)   This patient was seen by [facility] with Dr Lavera Guise as a part of collaborative care agreement  Orders Placed This Encounter  Procedures  .  CULTURE, URINE COMPREHENSIVE  . POCT Urinalysis Dipstick    Meds ordered this encounter  Medications  . triamterene-hydrochlorothiazide (MAXZIDE-25) 37.5-25 MG tablet    Sig: Take 1 tablet by mouth daily.    Dispense:  30 tablet    Refill:  5    Order Specific Question:   Supervising Provider    Answer:   Lavera Guise [[id number]]  . zolpidem (AMBIEN) 10 MG tablet    Sig: Take 1 tablet (10 mg total) by mouth at bedtime as needed for sleep.    Dispense:  30 tablet    Refill:  3    Order Specific Question:   Supervising Provider    Answer:   Lavera Guise [[id number]]  . cyclobenzaprine (FLEXERIL) 10 MG tablet    Sig: Take 1 tablet (10 mg total) by mouth 3 (three) times daily as needed for muscle spasms.    Dispense:  30 tablet    Refill:  3    Order Specific Question:   Supervising Provider    Answer:   Lavera Guise [[id number]]  . gabapentin (NEURONTIN) 100 MG capsule    Sig: Take 1 capsule po qam and two capsules po QPM    Dispense:  90 capsule    Refill:  3    Updating prescription.    Order Specific Question:   Supervising Provider    Answer:   Lavera Guise [[id number]]  . sulfamethoxazole-trimethoprim (BACTRIM DS) 800-160 MG tablet    Sig: Take 1 tablet by mouth 2 (two) times daily.    Dispense:  20 tablet    Refill:  0    Order Specific Question:   Supervising Provider    Answer:   Lavera Guise [[id number]]    Time spent: [place], MD  Internal Medicine

## 2018-11-30 MED ORDER — SULFAMETHOXAZOLE-TRIMETHOPRIM 800-160 MG PO TABS: 1.0000 | ORAL_TABLET | Freq: Two times a day (BID) | ORAL | 0 refills | Status: DC

## 2018-12-02 LAB — CULTURE, URINE COMPREHENSIVE

## 2018-12-06 ENCOUNTER — Ambulatory Visit: Payer: PPO | Admitting: Nurse Practitioner

## 2018-12-11 DIAGNOSIS — M545 Low back pain, unspecified: Secondary | ICD-10-CM | POA: Insufficient documentation

## 2019-01-02 ENCOUNTER — Other Ambulatory Visit: Payer: Self-pay | Admitting: Internal Medicine

## 2019-01-02 DIAGNOSIS — Z1231 Encounter for screening mammogram for malignant neoplasm of breast: Secondary | ICD-10-CM

## 2019-01-05 ENCOUNTER — Other Ambulatory Visit: Payer: Self-pay

## 2019-01-05 DIAGNOSIS — I10 Essential (primary) hypertension: Secondary | ICD-10-CM

## 2019-01-05 MED ORDER — NIFEDIPINE ER OSMOTIC RELEASE 30 MG PO TB24: 30.0000 mg | ORAL_TABLET | Freq: Two times a day (BID) | ORAL | 0 refills | Status: DC

## 2019-01-10 ENCOUNTER — Other Ambulatory Visit: Payer: Self-pay

## 2019-01-10 DIAGNOSIS — I10 Essential (primary) hypertension: Secondary | ICD-10-CM

## 2019-01-10 MED ORDER — NIFEDIPINE ER OSMOTIC RELEASE 30 MG PO TB24: 30.0000 mg | ORAL_TABLET | Freq: Two times a day (BID) | ORAL | 0 refills | Status: DC

## 2019-01-23 DIAGNOSIS — H40009 Preglaucoma, unspecified, unspecified eye: Secondary | ICD-10-CM | POA: Diagnosis not present

## 2019-02-01 DIAGNOSIS — H2513 Age-related nuclear cataract, bilateral: Secondary | ICD-10-CM | POA: Diagnosis not present

## 2019-02-15 ENCOUNTER — Ambulatory Visit
Admission: RE | Admit: 2019-02-15 | Discharge: 2019-02-15 | Disposition: A | Discharge status: Home / Self Care | Payer: PPO | Source: Ambulatory Visit | Attending: Nurse Practitioner | Admitting: Nurse Practitioner

## 2019-02-15 ENCOUNTER — Other Ambulatory Visit: Payer: Self-pay

## 2019-02-15 ENCOUNTER — Other Ambulatory Visit: Payer: Self-pay | Admitting: Nurse Practitioner

## 2019-02-15 ENCOUNTER — Ambulatory Visit: Payer: PPO

## 2019-02-15 DIAGNOSIS — N63 Unspecified lump in unspecified breast: Secondary | ICD-10-CM

## 2019-02-15 DIAGNOSIS — Z853 Personal history of malignant neoplasm of breast: Secondary | ICD-10-CM | POA: Diagnosis not present

## 2019-02-15 DIAGNOSIS — N6310 Unspecified lump in the right breast, unspecified quadrant: Secondary | ICD-10-CM

## 2019-02-15 DIAGNOSIS — N611 Abscess of the breast and nipple: Secondary | ICD-10-CM | POA: Diagnosis not present

## 2019-02-15 DIAGNOSIS — R928 Other abnormal and inconclusive findings on diagnostic imaging of breast: Secondary | ICD-10-CM | POA: Diagnosis not present

## 2019-02-15 DIAGNOSIS — C50212 Malignant neoplasm of upper-inner quadrant of left female breast: Secondary | ICD-10-CM

## 2019-02-15 DIAGNOSIS — Z170 Estrogen receptor positive status [ER+]: Secondary | ICD-10-CM

## 2019-02-15 NOTE — Progress Notes (Signed)
Additional images were bening. Repeat one year.

## 2019-02-15 NOTE — Progress Notes (Signed)
Additional images were benign.  Recommend repeat screening in one year.

## 2019-02-16 ENCOUNTER — Ambulatory Visit: Payer: PPO | Admitting: Nurse Practitioner

## 2019-03-05 NOTE — Progress Notes (Signed)
Benign.

## 2019-03-08 DIAGNOSIS — H2511 Age-related nuclear cataract, right eye: Secondary | ICD-10-CM | POA: Diagnosis not present

## 2019-03-08 DIAGNOSIS — I10 Essential (primary) hypertension: Secondary | ICD-10-CM | POA: Diagnosis not present

## 2019-03-16 ENCOUNTER — Other Ambulatory Visit: Payer: Self-pay | Admitting: Adult Health

## 2019-03-16 DIAGNOSIS — M170 Bilateral primary osteoarthritis of knee: Secondary | ICD-10-CM

## 2019-03-17 ENCOUNTER — Encounter: Payer: Self-pay | Admitting: *Deleted

## 2019-03-20 ENCOUNTER — Other Ambulatory Visit
Admission: RE | Admit: 2019-03-20 | Discharge: 2019-03-20 | Disposition: A | Discharge status: Home / Self Care | Payer: PPO | Source: Ambulatory Visit | Attending: Ophthalmology | Admitting: Ophthalmology

## 2019-03-20 ENCOUNTER — Other Ambulatory Visit: Payer: Self-pay

## 2019-03-20 DIAGNOSIS — Z20828 Contact with and (suspected) exposure to other viral communicable diseases: Secondary | ICD-10-CM | POA: Insufficient documentation

## 2019-03-20 DIAGNOSIS — Z01812 Encounter for preprocedural laboratory examination: Secondary | ICD-10-CM | POA: Diagnosis not present

## 2019-03-20 LAB — SARS CORONAVIRUS 2 (TAT 6-24 HRS): SARS Coronavirus 2: NEGATIVE

## 2019-03-23 ENCOUNTER — Encounter
Admission: RE | Disposition: A | Discharge status: Home / Self Care | Payer: Self-pay | Source: Home / Self Care | Attending: Ophthalmology

## 2019-03-23 ENCOUNTER — Ambulatory Visit
Admission: RE | Admit: 2019-03-23 | Discharge: 2019-03-23 | Disposition: A | Discharge status: Home / Self Care | Payer: PPO | Attending: Ophthalmology | Admitting: Ophthalmology

## 2019-03-23 ENCOUNTER — Ambulatory Visit: Payer: PPO | Admitting: Anesthesiology

## 2019-03-23 ENCOUNTER — Encounter: Payer: Self-pay | Admitting: *Deleted

## 2019-03-23 DIAGNOSIS — K219 Gastro-esophageal reflux disease without esophagitis: Secondary | ICD-10-CM | POA: Insufficient documentation

## 2019-03-23 DIAGNOSIS — K5790 Diverticulosis of intestine, part unspecified, without perforation or abscess without bleeding: Secondary | ICD-10-CM | POA: Diagnosis not present

## 2019-03-23 DIAGNOSIS — Z9884 Bariatric surgery status: Secondary | ICD-10-CM | POA: Diagnosis not present

## 2019-03-23 DIAGNOSIS — I10 Essential (primary) hypertension: Secondary | ICD-10-CM | POA: Diagnosis not present

## 2019-03-23 DIAGNOSIS — M1990 Unspecified osteoarthritis, unspecified site: Secondary | ICD-10-CM | POA: Insufficient documentation

## 2019-03-23 DIAGNOSIS — H2511 Age-related nuclear cataract, right eye: Secondary | ICD-10-CM | POA: Insufficient documentation

## 2019-03-23 DIAGNOSIS — Z79899 Other long term (current) drug therapy: Secondary | ICD-10-CM | POA: Diagnosis not present

## 2019-03-23 DIAGNOSIS — E119 Type 2 diabetes mellitus without complications: Secondary | ICD-10-CM | POA: Diagnosis not present

## 2019-03-23 HISTORY — PX: CATARACT EXTRACTION W/PHACO: SHX586

## 2019-03-23 SURGERY — PHACOEMULSIFICATION, CATARACT, WITH IOL INSERTION
Anesthesia: Monitor Anesthesia Care | Site: Eye | Laterality: Right

## 2019-03-23 MED ORDER — PHENYLEPHRINE HCL 10 % OP SOLN
1.0000 [drp] | Freq: Once | OPHTHALMIC | Status: AC
  Administered 2019-03-23: 1 [drp] via OPHTHALMIC

## 2019-03-23 MED ORDER — MIDAZOLAM HCL 2 MG/2ML IJ SOLN
INTRAMUSCULAR | Status: DC | PRN
  Administered 2019-03-23: 1 mg via INTRAVENOUS

## 2019-03-23 MED ORDER — MOXIFLOXACIN HCL 0.5 % OP SOLN: 1.0000 [drp] | Freq: Once | OPHTHALMIC | Status: DC

## 2019-03-23 MED ORDER — NA HYALUR & NA CHOND-NA HYALUR 0.55-0.5 ML IO KIT
PACK | INTRAOCULAR | Status: DC | PRN
  Administered 2019-03-23: 1 via OPHTHALMIC

## 2019-03-23 MED ORDER — MIDAZOLAM HCL 2 MG/2ML IJ SOLN
INTRAMUSCULAR | Status: AC
  Filled 2019-03-23: qty 2

## 2019-03-23 MED ORDER — CYCLOPENTOLATE HCL 2 % OP SOLN
OPHTHALMIC | Status: AC
  Filled 2019-03-23: qty 2

## 2019-03-23 MED ORDER — TRYPAN BLUE 0.06 % OP SOLN
OPHTHALMIC | Status: DC | PRN
  Administered 2019-03-23: 0.5 mL via INTRAOCULAR

## 2019-03-23 MED ORDER — NA CHONDROIT SULF-NA HYALURON 40-17 MG/ML IO SOLN
INTRAOCULAR | Status: DC | PRN
  Administered 2019-03-23: 1 mL via INTRAOCULAR

## 2019-03-23 MED ORDER — TETRACAINE HCL 0.5 % OP SOLN
OPHTHALMIC | Status: AC
  Administered 2019-03-23: 1 [drp] via OPHTHALMIC
  Filled 2019-03-23: qty 4

## 2019-03-23 MED ORDER — MOXIFLOXACIN HCL 0.5 % OP SOLN
OPHTHALMIC | Status: DC | PRN
  Administered 2019-03-23: 0.2 mL via OPHTHALMIC

## 2019-03-23 MED ORDER — FENTANYL CITRATE (PF) 100 MCG/2ML IJ SOLN
INTRAMUSCULAR | Status: AC
  Filled 2019-03-23: qty 2

## 2019-03-23 MED ORDER — FENTANYL CITRATE (PF) 100 MCG/2ML IJ SOLN
INTRAMUSCULAR | Status: DC | PRN
  Administered 2019-03-23 (×2): 50 ug via INTRAVENOUS

## 2019-03-23 MED ORDER — TETRACAINE HCL 0.5 % OP SOLN
1.0000 [drp] | Freq: Once | OPHTHALMIC | Status: AC
  Administered 2019-03-23 (×2): 1 [drp] via OPHTHALMIC

## 2019-03-23 MED ORDER — MOXIFLOXACIN HCL 0.5 % OP SOLN
OPHTHALMIC | Status: AC
  Filled 2019-03-23: qty 3

## 2019-03-23 MED ORDER — EPINEPHRINE PF 1 MG/ML IJ SOLN
INTRAOCULAR | Status: DC | PRN
  Administered 2019-03-23: 10:00:00 via OPHTHALMIC

## 2019-03-23 MED ORDER — LIDOCAINE HCL (PF) 4 % IJ SOLN
INTRAOCULAR | Status: DC | PRN
  Administered 2019-03-23: 4 mL via OPHTHALMIC

## 2019-03-23 MED ORDER — CYCLOPENTOLATE HCL 2 % OP SOLN
1.0000 [drp] | Freq: Once | OPHTHALMIC | Status: AC
  Administered 2019-03-23: 1 [drp] via OPHTHALMIC

## 2019-03-23 MED ORDER — POVIDONE-IODINE 5 % OP SOLN
OPHTHALMIC | Status: DC | PRN
  Administered 2019-03-23: 1 via OPHTHALMIC

## 2019-03-23 MED ORDER — PHENYLEPHRINE HCL 10 % OP SOLN
OPHTHALMIC | Status: AC
  Filled 2019-03-23: qty 5

## 2019-03-23 MED ORDER — ARMC OPHTHALMIC DILATING DROPS
OPHTHALMIC | Status: AC
  Administered 2019-03-23: 1 via OPHTHALMIC
  Filled 2019-03-23: qty 0.5

## 2019-03-23 MED ORDER — SODIUM CHLORIDE 0.9 % IV SOLN
INTRAVENOUS | Status: DC
  Administered 2019-03-23 (×2): via INTRAVENOUS

## 2019-03-23 MED ORDER — ARMC OPHTHALMIC DILATING DROPS
1.0000 "application " | OPHTHALMIC | Status: AC
  Administered 2019-03-23 (×3): 1 via OPHTHALMIC

## 2019-03-23 SURGICAL SUPPLY — 17 items
DISSECTOR HYDRO NUCLEUS 50X22 (MISCELLANEOUS) ×12 IMPLANT
DRSG TEGADERM 2-3/8X2-3/4 SM (GAUZE/BANDAGES/DRESSINGS) ×3 IMPLANT
GLOVE BIOGEL M 6.5 STRL (GLOVE) ×3 IMPLANT
GOWN STRL REUS W/ TWL LRG LVL3 (GOWN DISPOSABLE) ×1 IMPLANT
GOWN STRL REUS W/ TWL XL LVL3 (GOWN DISPOSABLE) ×1 IMPLANT
GOWN STRL REUS W/TWL LRG LVL3 (GOWN DISPOSABLE) ×3
GOWN STRL REUS W/TWL XL LVL3 (GOWN DISPOSABLE) ×3
KNIFE 45D UP 2.3 (MISCELLANEOUS) ×3 IMPLANT
LABEL CATARACT MEDS ST (LABEL) ×3 IMPLANT
LENS IOL TECNIS ITEC 24.0 (Intraocular Lens) ×2 IMPLANT
PACK CATARACT (MISCELLANEOUS) ×3 IMPLANT
PACK CATARACT KING (MISCELLANEOUS) ×3 IMPLANT
PACK EYE AFTER SURG (MISCELLANEOUS) ×3 IMPLANT
SOL BSS BAG (MISCELLANEOUS) ×3
SOLUTION BSS BAG (MISCELLANEOUS) ×1 IMPLANT
WATER STERILE IRR 250ML POUR (IV SOLUTION) ×3 IMPLANT
WIPE NON LINTING 3.25X3.25 (MISCELLANEOUS) ×3 IMPLANT

## 2019-03-23 NOTE — Anesthesia Post-op Follow-up Note (Signed)
Anesthesia QCDR form completed.        

## 2019-03-23 NOTE — Transfer of Care (Signed)
Immediate Anesthesia Transfer of Care Note  Patient: Regina Deleon  Procedure(s) Performed: CATARACT EXTRACTION PHACO AND INTRAOCULAR LENS PLACEMENT (IOC) RIGHT VISION BLUE (Right Eye)  Patient Location: PACU  Anesthesia Type:MAC  Level of Consciousness: awake, alert  and oriented  Airway & Oxygen Therapy: Patient Spontanous Breathing  Post-op Assessment: Report given to RN and Post -op Vital signs reviewed and stable  Post vital signs: Reviewed and stable  Last Vitals:  Vitals Value Taken Time  BP    Temp    Pulse    Resp    SpO2      Last Pain:  Vitals:   03/23/19 0812  TempSrc: Oral  PainSc: 0-No pain         Complications: No apparent anesthesia complications

## 2019-03-23 NOTE — Anesthesia Postprocedure Evaluation (Signed)
Anesthesia Post Note  Patient: MARY-ANNE POLIZZI  Procedure(s) Performed: CATARACT EXTRACTION PHACO AND INTRAOCULAR LENS PLACEMENT (IOC) RIGHT VISION BLUE (Right Eye)  Patient location during evaluation: Phase II Anesthesia Type: MAC Level of consciousness: awake and alert Pain management: pain level controlled Vital Signs Assessment: post-procedure vital signs reviewed and stable Respiratory status: spontaneous breathing, nonlabored ventilation and respiratory function stable Cardiovascular status: blood pressure returned to baseline and stable Postop Assessment: no apparent nausea or vomiting Anesthetic complications: no     Last Vitals:  Vitals:   03/23/19 0812 03/23/19 1020  BP: 106/64 107/69  Pulse: 79 72  Resp: 20 20  Temp: (!) 36.4 C 36.6 C  SpO2: 98% 94%    Last Pain:  Vitals:   03/23/19 1020  TempSrc: Temporal  PainSc: 0-No pain                 Alphonsus Sias

## 2019-03-23 NOTE — Anesthesia Procedure Notes (Signed)
Date/Time: 03/23/2019 9:50 AM Performed by: Allean Found, CRNA Pre-anesthesia Checklist: Patient identified, Emergency Drugs available, Suction available, Patient being monitored and Timeout performed Patient Re-evaluated:Patient Re-evaluated prior to induction Oxygen Delivery Method: Nasal cannula Placement Confirmation: positive ETCO2

## 2019-03-23 NOTE — Discharge Instructions (Signed)
Eye Surgery Discharge Instructions    Expect mild scratchy sensation or mild soreness. DO NOT RUB YOUR EYE!  The day of surgery:  Minimal physical activity, but bed rest is not required  No reading, computer work, or close hand work  No bending, lifting, or straining.  May watch TV  For 24 hours:  No driving, legal decisions, or alcoholic beverages  Safety precautions  Eat anything you prefer: It is better to start with liquids, then soup then solid foods.  _____ Eye patch should be worn until postoperative exam tomorrow.  ____ Solar shield eyeglasses should be worn for comfort in the sunlight/patch while sleeping  Resume all regular medications including aspirin or Coumadin if these were discontinued prior to surgery. You may shower, bathe, shave, or wash your hair. Tylenol may be taken for mild discomfort.  Call your doctor if you experience significant pain, nausea, or vomiting, fever > 101 or other signs of infection. [phone] or [phone] Specific instructions:  Follow-up Information    Marchia Meiers, MD Follow up on 03/24/2019.   Specialty: Ophthalmology Why: @ 8:50 am for post op visit Contact information: [address] [place] [address] [phone]

## 2019-03-23 NOTE — H&P (Signed)
   I have reviewed the patient's H&P and agree with its findings. There have been no interval changes.  Elanore Talcott MD Ophthalmology 

## 2019-03-23 NOTE — Anesthesia Preprocedure Evaluation (Addendum)
Anesthesia Evaluation  Patient identified by MRN, date of birth, ID band Patient awake    Reviewed: Allergy & Precautions, H&P , NPO status , reviewed documented beta blocker date and time   Airway Mallampati: III  TM Distance: >3 FB Neck ROM: full    Dental  (+) Missing   Pulmonary    Pulmonary exam normal        Cardiovascular hypertension, Normal cardiovascular exam  ECHO Study Conclusions  - Left ventricle: The cavity size was normal. Wall thickness was   normal. Systolic function was normal. The estimated ejection   fraction was in the range of 60% to 65%. Wall motion was normal;   there were no regional wall motion abnormalities. There was no   evidence of elevated ventricular filling pressure by Doppler   parameters   Neuro/Psych  Neuromuscular disease    GI/Hepatic GERD  Controlled,  Endo/Other  diabetes  Renal/GU      Musculoskeletal  (+) Arthritis ,   Abdominal   Peds  Hematology   Anesthesia Other Findings Past Medical History: No date: Cancer [facility])     Comment:  lt br cancer No date: Edema leg     Comment:  with chemo No date: GERD (gastroesophageal reflux disease)     Comment:  also, diverticulosis No date: History of kidney stones No date: Hyperlipidemia No date: Hypertension No date: Neuropathy     Comment:  hands and feet post chemo No date: Wears glasses  Past Surgical History: 1986: ABDOMINAL HYSTERECTOMY 06/26/2014: AXILLARY LYMPH NODE DISSECTION; Left     Comment:  Procedure: AXILLARY LYMPH NODE DISSECTION;  Surgeon:               Alphonsa Overall, MD;  Location: [place];                Service: General;  Laterality: Left; 1990, 1991: BACK SURGERY     Comment:  lumbar 06/2014: BREAST SURGERY; Left No date: COLONOSCOPY 12/29/2017: COLONOSCOPY WITH PROPOFOL; N/A     Comment:  Procedure: COLONOSCOPY WITH PROPOFOL;  Surgeon: [place],               Benay Pike, MD;   Location: [facility];  Service:               Gastroenterology;  Laterality: N/A; No date: DILATION AND CURETTAGE OF UTERUS 10/19/05: LAPAROSCOPIC GASTRIC BANDING 2016: MASTECTOMY; Left 06/26/2014: MASTECTOMY MODIFIED RADICAL; Left     Comment:  Procedure: MASTECTOMY MODIFIED RADICAL;  Surgeon: Alphonsa Overall, MD;  Location: [place];                Service: General;  Laterality: Left; No date: PORT A CATH REVISION     Comment:  insertion-10/15 02/07/2015: PORT-A-CATH REMOVAL; N/A     Comment:  Procedure: MINOR REMOVAL PORT-A-CATH;  Surgeon: Alphonsa Overall, MD;  Location: [place];                Service: General;  Laterality: N/A;  BMI    Body Mass Index: 35.78 kg/m      Reproductive/Obstetrics                           Anesthesia Physical Anesthesia Plan  ASA: II  Anesthesia Plan: MAC  Post-op Pain Management:    Induction: Intravenous  PONV Risk Score and Plan: Treatment may vary due to age or medical condition and TIVA  Airway Management Planned: Nasal Cannula and Natural Airway  Additional Equipment:   Intra-op Plan:   Post-operative Plan:   Informed Consent: I have reviewed the patients History and Physical, chart, labs and discussed the procedure including the risks, benefits and alternatives for the proposed anesthesia with the patient or authorized representative who has indicated his/her understanding and acceptance.     Dental Advisory Given  Plan Discussed with: CRNA  Anesthesia Plan Comments:         Anesthesia Quick Evaluation

## 2019-03-23 NOTE — Op Note (Signed)
  PREOPERATIVE DIAGNOSIS:  Nuclear sclerotic cataract of the RIGHT eye.   POSTOPERATIVE DIAGNOSIS:  Nuclear sclerotic cataract of the RIGHT eye.   OPERATIVE PROCEDURE: Cataract surgery OD   SURGEON:  Marchia Meiers, MD.   ANESTHESIA:  Anesthesiologist: Alphonsus Sias, MD CRNA: Allean Found, CRNA  1.      Managed anesthesia care. 2.     0.76ml of Shugarcaine was instilled following the paracentesis   COMPLICATIONS:  None.   TECHNIQUE:   Divide and conquer   DESCRIPTION OF PROCEDURE:  The patient was examined and consented in the preoperative holding area where the aforementioned topical anesthesia was applied to the RIGHT eye and then brought back to the Operating Room where the RIGHT eye was prepped and draped in the usual sterile ophthalmic fashion and a lid speculum was placed. A paracentesis was created with the side port blade, the anterior chamber was washed out with trypan blue to stain the anterior capsule, and the anterior chamber was filled with viscoelastic. A near clear corneal incision was performed with the steel keratome. A continuous curvilinear capsulorrhexis was performed with a cystotome followed by the capsulorrhexis forceps. Hydrodissection and hydrodelineation were carried out with BSS on a blunt cannula. The lens was removed in a divide and conquer  technique and the remaining cortical material was removed with the irrigation-aspiration handpiece. The capsular bag was inflated with viscoelastic and the lens was placed in the capsular bag without complication. The remaining viscoelastic was removed from the eye with the irrigation-aspiration handpiece. The wounds were hydrated. The anterior chamber was flushed and the eye was inflated to physiologic pressure. 0.64ml Vigamox was placed in the anterior chamber. The wounds were found to be water tight. The eye was dressed with Vigamox. The patient was given protective glasses to wear throughout the day and a shield with which to  sleep tonight. The patient was also given drops with which to begin a drop regimen today and will follow-up with me in one day. Implant Name Type Inv. Item Serial No. Manufacturer Lot No. LRB No. Used Action  LENS IOL DIOP 24.0 - [id number] 2002 Intraocular Lens LENS IOL DIOP 24.0 [id number] 2002 [place]  Right 1 Implanted    Procedure(s) with comments: CATARACT EXTRACTION PHACO AND INTRAOCULAR LENS PLACEMENT (IOC) RIGHT VISION BLUE (Right) - [place] 00:55.4 CDE 5.92 Fluid Pack lot # [id number]  Electronically signed: Marchia Meiers 03/23/2019 12:36 PM

## 2019-03-29 ENCOUNTER — Telehealth: Payer: Self-pay

## 2019-03-29 NOTE — Telephone Encounter (Signed)
CONFIRMED AND SCREENED FOR 04-04-19 OV. °

## 2019-04-03 ENCOUNTER — Telehealth: Payer: Self-pay

## 2019-04-03 NOTE — Telephone Encounter (Signed)
PT CONFIRMED 04-04-19 OV AS VIRTUAL.

## 2019-04-04 ENCOUNTER — Encounter: Payer: Self-pay | Admitting: Nurse Practitioner

## 2019-04-04 ENCOUNTER — Other Ambulatory Visit: Payer: Self-pay

## 2019-04-04 ENCOUNTER — Ambulatory Visit (INDEPENDENT_AMBULATORY_CARE_PROVIDER_SITE_OTHER): Payer: PPO | Admitting: Nurse Practitioner

## 2019-04-04 VITALS — BP 120/70 | Ht 63.0 in | Wt 202.0 lb

## 2019-04-04 DIAGNOSIS — G620 Drug-induced polyneuropathy: Secondary | ICD-10-CM | POA: Diagnosis not present

## 2019-04-04 DIAGNOSIS — F5101 Primary insomnia: Secondary | ICD-10-CM

## 2019-04-04 DIAGNOSIS — I10 Essential (primary) hypertension: Secondary | ICD-10-CM | POA: Diagnosis not present

## 2019-04-04 DIAGNOSIS — M545 Low back pain, unspecified: Secondary | ICD-10-CM

## 2019-04-04 DIAGNOSIS — T451X5A Adverse effect of antineoplastic and immunosuppressive drugs, initial encounter: Secondary | ICD-10-CM | POA: Diagnosis not present

## 2019-04-04 MED ORDER — IBUPROFEN 800 MG PO TABS: 800.0000 mg | ORAL_TABLET | Freq: Two times a day (BID) | ORAL | 2 refills | Status: DC | PRN

## 2019-04-04 MED ORDER — ZOLPIDEM TARTRATE 10 MG PO TABS: 10.0000 mg | ORAL_TABLET | Freq: Every evening | ORAL | 2 refills | Status: DC | PRN

## 2019-04-04 NOTE — Progress Notes (Signed)
[facility] [place], [place] [address]  Internal MEDICINE  Telephone Visit  Patient Name: Regina Deleon  [id number]  [id number]  Date of Service: 04/04/2019  I connected with the patient at 9:12am by telephone and verified the patients identity using two identifiers.   I discussed the limitations, risks, security and privacy concerns of performing an evaluation and management service by telephone and the availability of in person appointments. I also discussed with the patient that there may be a patient responsible charge related to the service.  The patient expressed understanding and agrees to proceed.    Chief Complaint  Patient presents with  . Telephone Assessment  . Telephone Screen  . Hypertension  . Hyperlipidemia  . Muscle Pain    The patient has been contacted via telephone for follow up visit due to concerns for spread of novel coronavirus. The patient states that she has body aches during the night. She states that this has been having these since she had chemotherapy for breast cancer. She states that ibuprofen helps a lot. She would like to have a refill for this as well as her sleeping medication. She had her mammogram in 02/2019 and It was benign. She also had cataract removal in 03/2019.       Current Medication: Outpatient Encounter Medications as of 04/04/2019  Medication Sig  . acetaminophen (TYLENOL) 500 MG tablet Take 500 mg by mouth every 6 (six) hours as needed for mild pain.  Marland Kitchen aspirin 81 MG tablet Take 81 mg by mouth daily.   . Cholecalciferol (VITAMIN D-3) 125 MCG (5000 UT) TABS Take 5,000 mg by mouth daily.  . cyclobenzaprine (FLEXERIL) 10 MG tablet Take 1 tablet (10 mg total) by mouth 3 (three) times daily as needed for muscle spasms.  . diclofenac Sodium (VOLTAREN) 1 % GEL APPLY 4 GRAMS TOPICALLY 4 (FOUR) TIMES DAILY. (Patient taking differently: Apply 2 g topically daily as needed (PAIN). )  . docusate sodium (COLACE) 100  MG capsule Take 200 mg by mouth daily.  . dorzolamide-timolol (COSOPT) 22.3-6.8 MG/ML ophthalmic solution Place 1 drop into both eyes 2 (two) times daily.  Marland Kitchen gabapentin (NEURONTIN) 100 MG capsule Take 1 capsule po qam and two capsules po QPM (Patient taking differently: Take 100 mg by mouth at bedtime as needed (pain in the right foot). )  . latanoprost (XALATAN) 0.005 % ophthalmic solution Place 1 drop into both eyes at bedtime.  Marland Kitchen NIFEdipine (PROCARDIA-XL/NIFEDICAL-XL) 30 MG 24 hr tablet Take 1 tablet (30 mg total) by mouth 2 (two) times daily.  . Omega 3 1000 MG CAPS Take 1,000 capsules by mouth daily.   Marland Kitchen sulfamethoxazole-trimethoprim (BACTRIM DS) 800-160 MG tablet Take 1 tablet by mouth 2 (two) times daily.  . traMADol (ULTRAM) 50 MG tablet Take 1 tablet (50 mg total) by mouth every 8 (eight) hours as needed for moderate pain.  Marland Kitchen triamterene-hydrochlorothiazide (MAXZIDE-25) 37.5-25 MG tablet Take 1 tablet by mouth daily.  Marland Kitchen zolpidem (AMBIEN) 10 MG tablet Take 1 tablet (10 mg total) by mouth at bedtime as needed for sleep.  . [DISCONTINUED] zolpidem (AMBIEN) 10 MG tablet Take 1 tablet (10 mg total) by mouth at bedtime as needed for sleep.  Marland Kitchen ibuprofen (ADVIL) 800 MG tablet Take 1 tablet (800 mg total) by mouth 2 (two) times daily as needed.   No facility-administered encounter medications on file as of 04/04/2019.     Surgical History: Past Surgical History:  Procedure Laterality Date  . ABDOMINAL  HYSTERECTOMY  1986  . AXILLARY LYMPH NODE DISSECTION Left 06/26/2014   Procedure: AXILLARY LYMPH NODE DISSECTION;  Surgeon: Alphonsa Overall, MD;  Location: [place];  Service: General;  Laterality: Left;  . [place]   lumbar  . BREAST SURGERY Left 06/2014  . CATARACT EXTRACTION W/PHACO Right 03/23/2019   Procedure: CATARACT EXTRACTION PHACO AND INTRAOCULAR LENS PLACEMENT ([place]) RIGHT VISION BLUE;  Surgeon: Marchia Meiers, MD;  Location: [facility];  Service:  Ophthalmology;  Laterality: Right;  [place] 00:55.4 CDE 5.92 Fluid Pack lot # [id number]  . COLONOSCOPY    . COLONOSCOPY WITH PROPOFOL N/A 12/29/2017   Procedure: COLONOSCOPY WITH PROPOFOL;  Surgeon: Toledo, Benay Pike, MD;  Location: [facility];  Service: Gastroenterology;  Laterality: N/A;  . DILATION AND CURETTAGE OF UTERUS    . LAPAROSCOPIC GASTRIC BANDING  10/19/05  . MASTECTOMY Left 2016  . MASTECTOMY MODIFIED RADICAL Left 06/26/2014   Procedure: MASTECTOMY MODIFIED RADICAL;  Surgeon: Alphonsa Overall, MD;  Location: [facility];  Service: General;  Laterality: Left;  . PORT A CATH REVISION     insertion-10/15  . PORT-A-CATH REMOVAL N/A 02/07/2015   Procedure: MINOR REMOVAL PORT-A-CATH;  Surgeon: Alphonsa Overall, MD;  Location: [facility];  Service: General;  Laterality: N/A;    Medical History: Past Medical History:  Diagnosis Date  . Cancer ([place])    lt br cancer  . Edema leg    with chemo  . GERD (gastroesophageal reflux disease)    also, diverticulosis  . History of kidney stones   . Hyperlipidemia   . Hypertension   . Neuropathy    hands and feet post chemo  . Wears glasses     Family History: Family History  Problem Relation Age of Onset  . Asthma Mother   . Diabetes Mother   . Diabetes Father   . Heart disease Father   . Cancer Maternal Aunt   . Cancer Cousin   . Cancer Cousin   . Cancer Cousin     Social History   Socioeconomic History  . Marital status: Married    Spouse name: [place] Bells  . Number of children: Not on file  . Years of education: Not on file  . Highest education level: Not on file  Occupational History  . Not on file  Social Needs  . Financial resource strain: Not on file  . Food insecurity    Worry: Not on file    Inability: Not on file  . Transportation needs    Medical: Not on file    Non-medical: Not on file  Tobacco Use  . Smoking status: Never Smoker  . Smokeless tobacco: Never Used  Substance and Sexual  Activity  . Alcohol use: No  . Drug use: No  . Sexual activity: Yes  Lifestyle  . Physical activity    Days per week: Not on file    Minutes per session: Not on file  . Stress: Not on file  Relationships  . Social Herbalist on phone: Not on file    Gets together: Not on file    Attends religious service: Not on file    Active member of club or organization: Not on file    Attends meetings of clubs or organizations: Not on file    Relationship status: Not on file  . Intimate partner violence    Fear of current or ex partner: Not on file    Emotionally  abused: Not on file    Physically abused: Not on file    Forced sexual activity: Not on file  Other Topics Concern  . Not on file  Social History Narrative  . Not on file      Review of Systems  Constitutional: Negative for activity change, chills, fatigue and unexpected weight change.  HENT: Negative for congestion, postnasal drip, rhinorrhea, sneezing and sore throat.   Eyes:       Recently had cataract removal. Doing well.   Respiratory: Negative for cough, chest tightness, shortness of breath and wheezing.   Cardiovascular: Negative for chest pain and palpitations.  Gastrointestinal: Negative for abdominal pain, constipation, diarrhea, nausea and vomiting.  Endocrine: Negative for cold intolerance, heat intolerance, polydipsia and polyuria.  Musculoskeletal: Positive for arthralgias and myalgias. Negative for back pain, joint swelling and neck pain.  Skin: Negative for rash.  Allergic/Immunologic: Negative for environmental allergies.  Neurological: Negative for dizziness, tremors, numbness and headaches.  Hematological: Negative for adenopathy. Does not bruise/bleed easily.  Psychiatric/Behavioral: Positive for sleep disturbance. Negative for behavioral problems (Depression) and suicidal ideas.    Today's Vitals   04/04/19 0854  BP: 120/70  Weight: 202 lb (91.6 kg)  Height: 5\' 3"  (1.6 m)   Body mass  index is 35.78 kg/m.  Observation/Objective:   The patient is alert and oriented. She is pleasant and answers all questions appropriately. Breathing is non-labored. She is in no acute distress at this time.    Assessment/Plan: 1. Essential hypertension Stable. She should continue bp medication as prescribed   2. Low back pain, unspecified back pain laterality, unspecified chronicity, unspecified whether sciatica present May take ibuprofen 800mg  up to twice daily as needed for pain/inflammation.  - ibuprofen (ADVIL) 800 MG tablet; Take 1 tablet (800 mg total) by mouth 2 (two) times daily as needed.  Dispense: 60 tablet; Refill: 2  3. Neuropathy due to chemotherapeutic drug (HCC) Ibuprofen as needed and as prescribed to reduce pain and inflammation. Continue to use gabapentin as prescribed.   4. Primary insomnia May continue ambien 10mg  at bedtime as needed. New prescription sent to her pharmacy today.  - zolpidem (AMBIEN) 10 MG tablet; Take 1 tablet (10 mg total) by mouth at bedtime as needed for sleep.  Dispense: 30 tablet; Refill: 2  General Counseling: Camdyn verbalizes understanding of the findings of today's phone visit and agrees with plan of treatment. I have discussed any further diagnostic evaluation that may be needed or ordered today. We also reviewed her medications today. she has been encouraged to call the office with any questions or concerns that should arise related to todays visit.   Hypertension Counseling:   The following hypertensive lifestyle modification were recommended and discussed:  1. Limiting alcohol intake to less than 1 oz/day of ethanol:(24 oz of beer or 8 oz of wine or 2 oz of 100-proof whiskey). 2. Take baby ASA 81 mg daily. 3. Importance of regular aerobic exercise and losing weight. 4. Reduce dietary saturated fat and cholesterol intake for overall cardiovascular health. 5. Maintaining adequate dietary potassium, calcium, and magnesium intake. 6.  Regular monitoring of the blood pressure. 7. Reduce sodium intake to less than 100 mmol/day (less than 2.3 gm of sodium or less than 6 gm of sodium choride)   This patient was seen by [place] with Dr Lavera Guise as a part of collaborative care agreement  Meds ordered this encounter  Medications  . ibuprofen (ADVIL) 800 MG tablet  Sig: Take 1 tablet (800 mg total) by mouth 2 (two) times daily as needed.    Dispense:  60 tablet    Refill:  2    Order Specific Question:   Supervising Provider    Answer:   Lavera Guise [id number]  . zolpidem (AMBIEN) 10 MG tablet    Sig: Take 1 tablet (10 mg total) by mouth at bedtime as needed for sleep.    Dispense:  30 tablet    Refill:  2    Order Specific Question:   Supervising Provider    Answer:   Lavera Guise [id number]    Time spent: 72 Minutes    Dr Lavera Guise Internal medicine

## 2019-05-18 ENCOUNTER — Ambulatory Visit: Payer: PPO | Attending: Internal Medicine

## 2019-05-18 DIAGNOSIS — Z20822 Contact with and (suspected) exposure to covid-19: Secondary | ICD-10-CM | POA: Diagnosis not present

## 2019-05-19 LAB — NOVEL CORONAVIRUS, NAA: SARS-CoV-2, NAA: NOT DETECTED

## 2019-05-20 ENCOUNTER — Telehealth: Payer: Self-pay | Admitting: Hematology

## 2019-05-20 NOTE — Telephone Encounter (Signed)
Pt is aware covid 19 test is neg on 05-20-2019

## 2019-05-24 DIAGNOSIS — H02834 Dermatochalasis of left upper eyelid: Secondary | ICD-10-CM | POA: Diagnosis not present

## 2019-05-24 DIAGNOSIS — H02831 Dermatochalasis of right upper eyelid: Secondary | ICD-10-CM | POA: Diagnosis not present

## 2019-06-08 DIAGNOSIS — H02403 Unspecified ptosis of bilateral eyelids: Secondary | ICD-10-CM | POA: Diagnosis not present

## 2019-06-15 ENCOUNTER — Ambulatory Visit: Admit: 2019-06-15 | Payer: PPO

## 2019-06-15 SURGERY — PHACOEMULSIFICATION, CATARACT, WITH IOL INSERTION
Anesthesia: Topical | Laterality: Left

## 2019-06-29 ENCOUNTER — Telehealth: Payer: Self-pay

## 2019-06-29 NOTE — Telephone Encounter (Signed)
LMOM FOR PATIENT TO SCREEN AND CONFIRM 07-03-19 OV.

## 2019-06-30 ENCOUNTER — Telehealth: Payer: Self-pay

## 2019-06-30 NOTE — Telephone Encounter (Signed)
SCREENED AND CONFIRMED FOR 07-03-19 OV.

## 2019-07-03 ENCOUNTER — Other Ambulatory Visit: Payer: Self-pay

## 2019-07-03 ENCOUNTER — Ambulatory Visit (INDEPENDENT_AMBULATORY_CARE_PROVIDER_SITE_OTHER): Payer: PPO | Admitting: Nurse Practitioner

## 2019-07-03 ENCOUNTER — Encounter: Payer: Self-pay | Admitting: Nurse Practitioner

## 2019-07-03 VITALS — BP 130/74 | HR 83 | Temp 97.4°F | Resp 16 | Ht 63.0 in | Wt 201.8 lb

## 2019-07-03 DIAGNOSIS — R1030 Lower abdominal pain, unspecified: Secondary | ICD-10-CM | POA: Diagnosis not present

## 2019-07-03 DIAGNOSIS — N390 Urinary tract infection, site not specified: Secondary | ICD-10-CM

## 2019-07-03 DIAGNOSIS — R319 Hematuria, unspecified: Secondary | ICD-10-CM | POA: Diagnosis not present

## 2019-07-03 DIAGNOSIS — R300 Dysuria: Secondary | ICD-10-CM

## 2019-07-03 DIAGNOSIS — F5101 Primary insomnia: Secondary | ICD-10-CM | POA: Diagnosis not present

## 2019-07-03 DIAGNOSIS — M5431 Sciatica, right side: Secondary | ICD-10-CM

## 2019-07-03 DIAGNOSIS — I10 Essential (primary) hypertension: Secondary | ICD-10-CM

## 2019-07-03 LAB — POCT URINALYSIS DIPSTICK
Bilirubin, UA: NEGATIVE
Glucose, UA: NEGATIVE
Ketones, UA: NEGATIVE
Nitrite, UA: NEGATIVE
Protein, UA: POSITIVE — AB
Spec Grav, UA: 1.02 (ref 1.010–1.025)
Urobilinogen, UA: 0.2 E.U./dL
pH, UA: 6 (ref 5.0–8.0)

## 2019-07-03 MED ORDER — ZOLPIDEM TARTRATE 10 MG PO TABS: 10.0000 mg | ORAL_TABLET | Freq: Every evening | ORAL | 2 refills | Status: DC | PRN

## 2019-07-03 MED ORDER — TRIAMTERENE-HCTZ 37.5-25 MG PO TABS: 1.0000 | ORAL_TABLET | Freq: Every day | ORAL | 3 refills | Status: DC

## 2019-07-03 MED ORDER — SULFAMETHOXAZOLE-TRIMETHOPRIM 800-160 MG PO TABS: 1.0000 | ORAL_TABLET | Freq: Two times a day (BID) | ORAL | 0 refills | Status: DC

## 2019-07-03 MED ORDER — TRAMADOL HCL 50 MG PO TABS: 50.0000 mg | ORAL_TABLET | Freq: Three times a day (TID) | ORAL | 0 refills | Status: DC | PRN

## 2019-07-03 NOTE — Progress Notes (Signed)
[facility] [place], [place] [address]  Internal MEDICINE  Office Visit Note  Patient Name: Regina Deleon  [id number]  UL:[id number]  Date of Service: 07/03/2019  Chief Complaint  Patient presents with  . Hypertension  . Hyperlipidemia  . Gastroesophageal Reflux  . Groin Pain  . Leg Pain    right leg hurts worse than left   . Medication Refill    sleeping pills need filled, and would like tramadol because ibuprofen gives her stomach pain     The patient is here for routine follow up visit. She states that she has been having lower abdominal pain and back pain. Pain will radiate into the right leg. Has been going on for the past week or two. Hurts some when she urinates. Feels similar to when she had kidney stone in the past. She did take one of her sister's tramadol which did help to relieve the pain.  Her blood pressure is well controlled. She does need a new prescription for her Lorrin Mais today. She takes this on as needed basis when she is unable to sleep.       Current Medication: Outpatient Encounter Medications as of 07/03/2019  Medication Sig  . acetaminophen (TYLENOL) 500 MG tablet Take 500 mg by mouth every 6 (six) hours as needed for mild pain.  Marland Kitchen aspirin 81 MG tablet Take 81 mg by mouth daily.   . Cholecalciferol (VITAMIN D-3) 125 MCG (5000 UT) TABS Take 5,000 mg by mouth daily.  . cyclobenzaprine (FLEXERIL) 10 MG tablet Take 1 tablet (10 mg total) by mouth 3 (three) times daily as needed for muscle spasms.  . diclofenac Sodium (VOLTAREN) 1 % GEL APPLY 4 GRAMS TOPICALLY 4 (FOUR) TIMES DAILY. (Patient taking differently: Apply 2 g topically daily as needed (PAIN). )  . docusate sodium (COLACE) 100 MG capsule Take 200 mg by mouth daily.  . dorzolamide-timolol (COSOPT) 22.3-6.8 MG/ML ophthalmic solution Place 1 drop into both eyes 2 (two) times daily.  Marland Kitchen gabapentin (NEURONTIN) 100 MG capsule Take 1 capsule po qam and two capsules po QPM (Patient  taking differently: Take 100 mg by mouth at bedtime as needed (pain in the right foot). )  . ibuprofen (ADVIL) 800 MG tablet Take 1 tablet (800 mg total) by mouth 2 (two) times daily as needed.  . latanoprost (XALATAN) 0.005 % ophthalmic solution Place 1 drop into both eyes at bedtime.  Marland Kitchen NIFEdipine (PROCARDIA-XL/NIFEDICAL-XL) 30 MG 24 hr tablet Take 1 tablet (30 mg total) by mouth 2 (two) times daily.  . Omega 3 1000 MG CAPS Take 1,000 capsules by mouth daily.   . traMADol (ULTRAM) 50 MG tablet Take 1 tablet (50 mg total) by mouth every 8 (eight) hours as needed for moderate pain.  Marland Kitchen triamterene-hydrochlorothiazide (MAXZIDE-25) 37.5-25 MG tablet Take 1 tablet by mouth daily.  Marland Kitchen zolpidem (AMBIEN) 10 MG tablet Take 1 tablet (10 mg total) by mouth at bedtime as needed for sleep.  . [DISCONTINUED] traMADol (ULTRAM) 50 MG tablet Take 1 tablet (50 mg total) by mouth every 8 (eight) hours as needed for moderate pain.  . [DISCONTINUED] triamterene-hydrochlorothiazide (MAXZIDE-25) 37.5-25 MG tablet Take 1 tablet by mouth daily.  . [DISCONTINUED] zolpidem (AMBIEN) 10 MG tablet Take 1 tablet (10 mg total) by mouth at bedtime as needed for sleep.  Marland Kitchen sulfamethoxazole-trimethoprim (BACTRIM DS) 800-160 MG tablet Take 1 tablet by mouth 2 (two) times daily.  . [DISCONTINUED] sulfamethoxazole-trimethoprim (BACTRIM DS) 800-160 MG tablet Take 1 tablet by  mouth 2 (two) times daily. (Patient not taking: Reported on 07/03/2019)   No facility-administered encounter medications on file as of 07/03/2019.    Surgical History: Past Surgical History:  Procedure Laterality Date  . ABDOMINAL HYSTERECTOMY  1986  . AXILLARY LYMPH NODE DISSECTION Left 06/26/2014   Procedure: AXILLARY LYMPH NODE DISSECTION;  Surgeon: Alphonsa Overall, MD;  Location: [place];  Service: General;  Laterality: Left;  . [place]   lumbar  . BREAST SURGERY Left 06/2014  . CATARACT EXTRACTION W/PHACO Right 03/23/2019    Procedure: CATARACT EXTRACTION PHACO AND INTRAOCULAR LENS PLACEMENT ([place]) RIGHT VISION BLUE;  Surgeon: Marchia Meiers, MD;  Location: [facility];  Service: Ophthalmology;  Laterality: Right;  [place] 00:55.4 CDE 5.92 Fluid Pack lot # [id number]  . COLONOSCOPY    . COLONOSCOPY WITH PROPOFOL N/A 12/29/2017   Procedure: COLONOSCOPY WITH PROPOFOL;  Surgeon: Toledo, Benay Pike, MD;  Location: [facility];  Service: Gastroenterology;  Laterality: N/A;  . DILATION AND CURETTAGE OF UTERUS    . LAPAROSCOPIC GASTRIC BANDING  10/19/05  . MASTECTOMY Left 2016  . MASTECTOMY MODIFIED RADICAL Left 06/26/2014   Procedure: MASTECTOMY MODIFIED RADICAL;  Surgeon: Alphonsa Overall, MD;  Location: [facility];  Service: General;  Laterality: Left;  . PORT A CATH REVISION     insertion-10/15  . PORT-A-CATH REMOVAL N/A 02/07/2015   Procedure: MINOR REMOVAL PORT-A-CATH;  Surgeon: Alphonsa Overall, MD;  Location: [facility];  Service: General;  Laterality: N/A;    Medical History: Past Medical History:  Diagnosis Date  . Cancer ([place])    lt br cancer  . Edema leg    with chemo  . GERD (gastroesophageal reflux disease)    also, diverticulosis  . History of kidney stones   . Hyperlipidemia   . Hypertension   . Neuropathy    hands and feet post chemo  . Wears glasses     Family History: Family History  Problem Relation Age of Onset  . Asthma Mother   . Diabetes Mother   . Diabetes Father   . Heart disease Father   . Cancer Maternal Aunt   . Cancer Cousin   . Cancer Cousin   . Cancer Cousin     Social History   Socioeconomic History  . Marital status: Married    Spouse name: Lake Bells  . Number of children: Not on file  . Years of education: Not on file  . Highest education level: Not on file  Occupational History  . Not on file  Tobacco Use  . Smoking status: Never Smoker  . Smokeless tobacco: Never Used  Substance and Sexual Activity  . Alcohol use: No  . Drug use: No  .  Sexual activity: Yes  Other Topics Concern  . Not on file  Social History Narrative  . Not on file   Social Determinants of Health   Financial Resource Strain:   . Difficulty of Paying Living Expenses: Not on file  Food Insecurity:   . Worried About Charity fundraiser in the Last Year: Not on file  . Ran Out of Food in the Last Year: Not on file  Transportation Needs:   . Lack of Transportation (Medical): Not on file  . Lack of Transportation (Non-Medical): Not on file  Physical Activity:   . Days of Exercise per Week: Not on file  . Minutes of Exercise per Session: Not on file  Stress:   . Feeling of  Stress : Not on file  Social Connections:   . Frequency of Communication with Friends and Family: Not on file  . Frequency of Social Gatherings with Friends and Family: Not on file  . Attends Religious Services: Not on file  . Active Member of Clubs or Organizations: Not on file  . Attends Archivist Meetings: Not on file  . Marital Status: Not on file  Intimate Partner Violence:   . Fear of Current or Ex-Partner: Not on file  . Emotionally Abused: Not on file  . Physically Abused: Not on file  . Sexually Abused: Not on file      Review of Systems  Constitutional: Negative for activity change, chills, fatigue and unexpected weight change.  HENT: Negative for congestion, postnasal drip, rhinorrhea, sneezing and sore throat.   Respiratory: Negative for cough, chest tightness, shortness of breath and wheezing.   Cardiovascular: Negative for chest pain and palpitations.  Gastrointestinal: Negative for abdominal pain, constipation, diarrhea, nausea and vomiting.  Endocrine: Negative for cold intolerance, heat intolerance and polydipsia.  Genitourinary: Positive for dysuria, flank pain, frequency, hematuria and pelvic pain.  Musculoskeletal: Positive for back pain. Negative for arthralgias, joint swelling and neck pain.       Back pain radiating into the right hip and  leg.   Skin: Negative for rash.  Allergic/Immunologic: Negative for environmental allergies.  Neurological: Negative for dizziness, tremors, numbness and headaches.  Hematological: Negative for adenopathy. Does not bruise/bleed easily.  Psychiatric/Behavioral: Positive for sleep disturbance. Negative for behavioral problems (Depression) and suicidal ideas. The patient is not nervous/anxious.    Today's Vitals   07/03/19 1027  BP: 130/74  Pulse: 83  Resp: 16  Temp: (!) 97.4 F (36.3 C)  SpO2: 94%  Weight: 201 lb 12.8 oz (91.5 kg)  Height: 5\' 3"  (1.6 m)   Body mass index is 35.75 kg/m.  Physical Exam Vitals and nursing note reviewed.  Constitutional:      General: She is not in acute distress.    Appearance: Normal appearance. She is well-developed. She is not diaphoretic.  HENT:     Head: Normocephalic and atraumatic.     Mouth/Throat:     Pharynx: No oropharyngeal exudate.  Eyes:     Pupils: Pupils are equal, round, and reactive to light.  Neck:     Thyroid: No thyromegaly.     Vascular: No JVD.     Trachea: No tracheal deviation.  Cardiovascular:     Rate and Rhythm: Normal rate and regular rhythm.     Heart sounds: Normal heart sounds. No murmur. No friction rub. No gallop.   Pulmonary:     Effort: Pulmonary effort is normal. No respiratory distress.     Breath sounds: Normal breath sounds. No wheezing or rales.  Chest:     Chest wall: No tenderness.  Abdominal:     General: Bowel sounds are normal.     Palpations: Abdomen is soft.     Tenderness: There is abdominal tenderness.     Comments: Mild tenderness of the pelvic area of abdomen.   Genitourinary:    Comments: Urine sample positive for protein, small WBC, and large blood.  Musculoskeletal:        General: Normal range of motion.     Cervical back: Normal range of motion and neck supple.  Lymphadenopathy:     Cervical: No cervical adenopathy.  Skin:    General: Skin is warm and dry.  Neurological:  Mental Status: She is alert and oriented to person, place, and time.     Cranial Nerves: No cranial nerve deficit.  Psychiatric:        Behavior: Behavior normal.        Thought Content: Thought content normal.        Judgment: Judgment normal.    Assessment/Plan: 1. Urinary tract infection with hematuria, site unspecified Treat with bactrim DS bid for 10 days. Send urine for culture and sensitivity and adjust antibiotics as indicated  - sulfamethoxazole-trimethoprim (BACTRIM DS) 800-160 MG tablet; Take 1 tablet by mouth 2 (two) times daily.  Dispense: 20 tablet; Refill: 0  2. Lower abdominal pain Hematuria with lower abdominal pain and history of renal stones. Will get CT renal stone study for further evaluation. Refer to urology as indicated  - CT RENAL STONE STUDY; Future  3. Right sided sciatica May take tramadol up to three times daily as needed for pain. Patient understands not to take this if driving or working.  - traMADol (ULTRAM) 50 MG tablet; Take 1 tablet (50 mg total) by mouth every 8 (eight) hours as needed for moderate pain.  Dispense: 45 tablet; Refill: 0  4. Essential hypertension Stable. Continue bp medication as prescribed  - triamterene-hydrochlorothiazide (MAXZIDE-25) 37.5-25 MG tablet; Take 1 tablet by mouth daily.  Dispense: 90 tablet; Refill: 3  5. Primary insomnia May take ambien as needed and as prescribed. Refills provided today.  - zolpidem (AMBIEN) 10 MG tablet; Take 1 tablet (10 mg total) by mouth at bedtime as needed for sleep.  Dispense: 30 tablet; Refill: 2  6. Dysuria - POCT Urinalysis Dipstick - CULTURE, URINE COMPREHENSIVE  General Counseling: Alexza verbalizes understanding of the findings of todays visit and agrees with plan of treatment. I have discussed any further diagnostic evaluation that may be needed or ordered today. We also reviewed her medications today. she has been encouraged to call the office with any questions or concerns that  should arise related to todays visit.  This patient was seen by Leretha Pol FNP Collaboration with Dr Lavera Guise as a part of collaborative care agreement  Orders Placed This Encounter  Procedures  . CULTURE, URINE COMPREHENSIVE  . CT RENAL STONE STUDY  . POCT Urinalysis Dipstick    Meds ordered this encounter  Medications  . sulfamethoxazole-trimethoprim (BACTRIM DS) 800-160 MG tablet    Sig: Take 1 tablet by mouth 2 (two) times daily.    Dispense:  20 tablet    Refill:  0    Order Specific Question:   Supervising Provider    Answer:   Lavera Guise [id number]  . traMADol (ULTRAM) 50 MG tablet    Sig: Take 1 tablet (50 mg total) by mouth every 8 (eight) hours as needed for moderate pain.    Dispense:  45 tablet    Refill:  0    Order Specific Question:   Supervising Provider    Answer:   Lavera Guise [id number]  . triamterene-hydrochlorothiazide (MAXZIDE-25) 37.5-25 MG tablet    Sig: Take 1 tablet by mouth daily.    Dispense:  90 tablet    Refill:  3    Order Specific Question:   Supervising Provider    Answer:   Lavera Guise [id number]  . zolpidem (AMBIEN) 10 MG tablet    Sig: Take 1 tablet (10 mg total) by mouth at bedtime as needed for sleep.    Dispense:  30 tablet    Refill:  2    Order Specific Question:   Supervising Provider    Answer:   Lavera Guise [id number]    Total time spent: 30 Minutes   Time spent includes review of chart, medications, test results, and follow up plan with the patient.      Dr Lavera Guise Internal medicine

## 2019-07-05 ENCOUNTER — Other Ambulatory Visit: Payer: Self-pay

## 2019-07-05 DIAGNOSIS — M545 Low back pain, unspecified: Secondary | ICD-10-CM

## 2019-07-05 MED ORDER — IBUPROFEN 800 MG PO TABS: 800.0000 mg | ORAL_TABLET | Freq: Two times a day (BID) | ORAL | 2 refills | Status: DC | PRN

## 2019-07-10 ENCOUNTER — Telehealth: Payer: Self-pay

## 2019-07-10 NOTE — Telephone Encounter (Signed)
Confirmed appointment on 07/12/2019 and screened for covid. klh

## 2019-07-11 ENCOUNTER — Other Ambulatory Visit: Payer: Self-pay

## 2019-07-11 ENCOUNTER — Ambulatory Visit
Admission: RE | Admit: 2019-07-11 | Discharge: 2019-07-11 | Disposition: A | Discharge status: Home / Self Care | Payer: PPO | Source: Ambulatory Visit | Attending: Nurse Practitioner | Admitting: Nurse Practitioner

## 2019-07-11 DIAGNOSIS — R1030 Lower abdominal pain, unspecified: Secondary | ICD-10-CM | POA: Diagnosis not present

## 2019-07-11 DIAGNOSIS — N200 Calculus of kidney: Secondary | ICD-10-CM | POA: Diagnosis not present

## 2019-07-12 ENCOUNTER — Ambulatory Visit (INDEPENDENT_AMBULATORY_CARE_PROVIDER_SITE_OTHER): Payer: PPO | Admitting: Adult Health

## 2019-07-12 ENCOUNTER — Encounter: Payer: Self-pay | Admitting: Adult Health

## 2019-07-12 VITALS — BP 163/79 | HR 84 | Temp 97.4°F | Resp 16 | Ht 63.0 in | Wt 199.6 lb

## 2019-07-12 DIAGNOSIS — K9509 Other complications of gastric band procedure: Secondary | ICD-10-CM

## 2019-07-12 DIAGNOSIS — R319 Hematuria, unspecified: Secondary | ICD-10-CM | POA: Diagnosis not present

## 2019-07-12 DIAGNOSIS — E785 Hyperlipidemia, unspecified: Secondary | ICD-10-CM | POA: Diagnosis not present

## 2019-07-12 DIAGNOSIS — I10 Essential (primary) hypertension: Secondary | ICD-10-CM

## 2019-07-12 DIAGNOSIS — Z6835 Body mass index (BMI) 35.0-35.9, adult: Secondary | ICD-10-CM | POA: Diagnosis not present

## 2019-07-12 DIAGNOSIS — R918 Other nonspecific abnormal finding of lung field: Secondary | ICD-10-CM

## 2019-07-12 DIAGNOSIS — N390 Urinary tract infection, site not specified: Secondary | ICD-10-CM

## 2019-07-12 DIAGNOSIS — N200 Calculus of kidney: Secondary | ICD-10-CM

## 2019-07-12 DIAGNOSIS — I700 Atherosclerosis of aorta: Secondary | ICD-10-CM

## 2019-07-12 NOTE — Progress Notes (Addendum)
[facility] [place], [place] [address]  Internal MEDICINE  Office Visit Note  Patient Name: Regina Deleon  [id number]  UL:[id number]  Date of Service: 07/12/2019  Chief Complaint  Patient presents with  . Gastroesophageal Reflux  . Hyperlipidemia  . Hypertension  . Dysuria    little discomfort when urinating     HPI  Pt is here for follow up on UTI, as well as CT scan.  She reports she is one day from completing a course of antibiotics.  She continues to have some discomfort with urinating.  She has some urgency frequency.  She reports feeling like she didn't empty her bladder completely this morning. There are stones present on CT.      Current Medication: Outpatient Encounter Medications as of 07/12/2019  Medication Sig  . acetaminophen (TYLENOL) 500 MG tablet Take 500 mg by mouth every 6 (six) hours as needed for mild pain.  Marland Kitchen aspirin 81 MG tablet Take 81 mg by mouth daily.   . Cholecalciferol (VITAMIN D-3) 125 MCG (5000 UT) TABS Take 5,000 mg by mouth daily.  . cyclobenzaprine (FLEXERIL) 10 MG tablet Take 1 tablet (10 mg total) by mouth 3 (three) times daily as needed for muscle spasms.  . diclofenac Sodium (VOLTAREN) 1 % GEL APPLY 4 GRAMS TOPICALLY 4 (FOUR) TIMES DAILY. (Patient taking differently: Apply 2 g topically daily as needed (PAIN). )  . docusate sodium (COLACE) 100 MG capsule Take 200 mg by mouth daily.  . dorzolamide-timolol (COSOPT) 22.3-6.8 MG/ML ophthalmic solution Place 1 drop into both eyes 2 (two) times daily.  Marland Kitchen gabapentin (NEURONTIN) 100 MG capsule Take 1 capsule po qam and two capsules po QPM (Patient taking differently: Take 100 mg by mouth at bedtime as needed (pain in the right foot). )  . ibuprofen (ADVIL) 800 MG tablet Take 1 tablet (800 mg total) by mouth 2 (two) times daily as needed.  . latanoprost (XALATAN) 0.005 % ophthalmic solution Place 1 drop into both eyes at bedtime.  Marland Kitchen NIFEdipine (PROCARDIA-XL/NIFEDICAL-XL) 30 MG  24 hr tablet Take 1 tablet (30 mg total) by mouth 2 (two) times daily.  . Omega 3 1000 MG CAPS Take 1,000 capsules by mouth daily.   Marland Kitchen sulfamethoxazole-trimethoprim (BACTRIM DS) 800-160 MG tablet Take 1 tablet by mouth 2 (two) times daily.  . traMADol (ULTRAM) 50 MG tablet Take 1 tablet (50 mg total) by mouth every 8 (eight) hours as needed for moderate pain.  Marland Kitchen triamterene-hydrochlorothiazide (MAXZIDE-25) 37.5-25 MG tablet Take 1 tablet by mouth daily.  Marland Kitchen zolpidem (AMBIEN) 10 MG tablet Take 1 tablet (10 mg total) by mouth at bedtime as needed for sleep.   No facility-administered encounter medications on file as of 07/12/2019.    Surgical History: Past Surgical History:  Procedure Laterality Date  . ABDOMINAL HYSTERECTOMY  1986  . AXILLARY LYMPH NODE DISSECTION Left 06/26/2014   Procedure: AXILLARY LYMPH NODE DISSECTION;  Surgeon: Alphonsa Overall, MD;  Location: [facility];  Service: General;  Laterality: Left;  . [place]   lumbar  . BREAST SURGERY Left 06/2014  . CATARACT EXTRACTION W/PHACO Right 03/23/2019   Procedure: CATARACT EXTRACTION PHACO AND INTRAOCULAR LENS PLACEMENT ([place]) RIGHT VISION BLUE;  Surgeon: Marchia Meiers, MD;  Location: [facility];  Service: Ophthalmology;  Laterality: Right;  [place] 00:55.4 CDE 5.92 Fluid Pack lot # [id number]  . COLONOSCOPY    . COLONOSCOPY WITH PROPOFOL N/A 12/29/2017   Procedure: COLONOSCOPY WITH PROPOFOL;  Surgeon: Toledo, Benay Pike, MD;  Location: [facility];  Service: Gastroenterology;  Laterality: N/A;  . DILATION AND CURETTAGE OF UTERUS    . LAPAROSCOPIC GASTRIC BANDING  10/19/05  . MASTECTOMY Left 2016  . MASTECTOMY MODIFIED RADICAL Left 06/26/2014   Procedure: MASTECTOMY MODIFIED RADICAL;  Surgeon: Alphonsa Overall, MD;  Location: [place];  Service: General;  Laterality: Left;  . PORT A CATH REVISION     insertion-10/15  . PORT-A-CATH REMOVAL N/A 02/07/2015   Procedure: MINOR REMOVAL PORT-A-CATH;   Surgeon: Alphonsa Overall, MD;  Location: [facility];  Service: General;  Laterality: N/A;    Medical History: Past Medical History:  Diagnosis Date  . Cancer ([place])    lt br cancer  . Edema leg    with chemo  . GERD (gastroesophageal reflux disease)    also, diverticulosis  . History of kidney stones   . Hyperlipidemia   . Hypertension   . Neuropathy    hands and feet post chemo  . Wears glasses     Family History: Family History  Problem Relation Age of Onset  . Asthma Mother   . Diabetes Mother   . Diabetes Father   . Heart disease Father   . Cancer Maternal Aunt   . Cancer Cousin   . Cancer Cousin   . Cancer Cousin     Social History   Socioeconomic History  . Marital status: Married    Spouse name: Lake Bells  . Number of children: Not on file  . Years of education: Not on file  . Highest education level: Not on file  Occupational History  . Not on file  Tobacco Use  . Smoking status: Never Smoker  . Smokeless tobacco: Never Used  Substance and Sexual Activity  . Alcohol use: No  . Drug use: No  . Sexual activity: Yes  Other Topics Concern  . Not on file  Social History Narrative  . Not on file   Social Determinants of Health   Financial Resource Strain:   . Difficulty of Paying Living Expenses: Not on file  Food Insecurity:   . Worried About Charity fundraiser in the Last Year: Not on file  . Ran Out of Food in the Last Year: Not on file  Transportation Needs:   . Lack of Transportation (Medical): Not on file  . Lack of Transportation (Non-Medical): Not on file  Physical Activity:   . Days of Exercise per Week: Not on file  . Minutes of Exercise per Session: Not on file  Stress:   . Feeling of Stress : Not on file  Social Connections:   . Frequency of Communication with Friends and Family: Not on file  . Frequency of Social Gatherings with Friends and Family: Not on file  . Attends Religious Services: Not on file  . Active Member  of Clubs or Organizations: Not on file  . Attends Archivist Meetings: Not on file  . Marital Status: Not on file  Intimate Partner Violence:   . Fear of Current or Ex-Partner: Not on file  . Emotionally Abused: Not on file  . Physically Abused: Not on file  . Sexually Abused: Not on file      Review of Systems  Constitutional: Negative for chills, fatigue and unexpected weight change.  HENT: Negative for congestion, rhinorrhea, sneezing and sore throat.   Eyes: Negative for photophobia, pain and redness.  Respiratory: Negative for cough, chest tightness and shortness of  breath.   Cardiovascular: Negative for chest pain and palpitations.  Gastrointestinal: Negative for abdominal pain, constipation, diarrhea, nausea and vomiting.  Endocrine: Negative.   Genitourinary: Negative for dysuria and frequency.  Musculoskeletal: Negative for arthralgias, back pain, joint swelling and neck pain.  Skin: Negative for rash.  Allergic/Immunologic: Negative.   Neurological: Negative for tremors and numbness.  Hematological: Negative for adenopathy. Does not bruise/bleed easily.  Psychiatric/Behavioral: Negative for behavioral problems and sleep disturbance. The patient is not nervous/anxious.     Vital Signs: BP (!) 163/79   Pulse 84   Temp (!) 97.4 F (36.3 C)   Resp 16   Ht 5\' 3"  (1.6 m)   Wt 199 lb 9.6 oz (90.5 kg)   LMP 06/02/1984   SpO2 96%   BMI 35.36 kg/m    Physical Exam Vitals and nursing note reviewed.  Constitutional:      General: She is not in acute distress.    Appearance: She is well-developed. She is not diaphoretic.  HENT:     Head: Normocephalic and atraumatic.     Mouth/Throat:     Pharynx: No oropharyngeal exudate.  Eyes:     Pupils: Pupils are equal, round, and reactive to light.  Neck:     Thyroid: No thyromegaly.     Vascular: No JVD.     Trachea: No tracheal deviation.  Cardiovascular:     Rate and Rhythm: Normal rate and regular rhythm.      Heart sounds: Normal heart sounds. No murmur. No friction rub. No gallop.   Pulmonary:     Effort: Pulmonary effort is normal. No respiratory distress.     Breath sounds: Normal breath sounds. No wheezing or rales.  Chest:     Chest wall: No tenderness.  Abdominal:     Palpations: Abdomen is soft.     Tenderness: There is no abdominal tenderness. There is no guarding.  Musculoskeletal:        General: Normal range of motion.     Cervical back: Normal range of motion and neck supple.  Lymphadenopathy:     Cervical: No cervical adenopathy.  Skin:    General: Skin is warm and dry.  Neurological:     Mental Status: She is alert and oriented to person, place, and time.     Cranial Nerves: No cranial nerve deficit.  Psychiatric:        Behavior: Behavior normal.        Thought Content: Thought content normal.        Judgment: Judgment normal.    Assessment/Plan: 1. Renal calculi PT has partially obstructing stone in Right kidney.  Referral to Urology.  - Ambulatory referral to Urology  2. Urinary tract infection with hematuria, site unspecified Finish antibiotics as prescribed.  Follow up in 3 days of no better.   3. Essential hypertension Elevated today, patient has not taken bp meds this morning. Encouraged her to go home and take her medicine, if no change in bp call office.   4. Hyperlipidemia, unspecified hyperlipidemia type Continue to follow.   5. BMI 35.0-35.9,adult Comorbidity of HLD, and HTN.  Obesity Counseling: Risk Assessment: An assessment of behavioral risk factors was made today and includes lack of exercise sedentary lifestyle, lack of portion control and poor dietary habits.  Risk Modification Advice: She was counseled on portion control guidelines. Restricting daily caloric intake to 1800. The detrimental long term effects of obesity on her health and ongoing poor compliance was also discussed with the  patient.  6. Pulmonary nodules Discussed with  patient that due to history of breast ca, another CT scan should be done in the next 6 months for surveillance. She is agreable, and when she returns in June 2021, will schedule Ct.    7. Aortic atherosclerosis ([place]) On Ct 07/11/19,  Discussed Continues Asa use, as well as adding Statin to regimen.  Pt will think about statin therapy and let us know at next visit.    8. Gastric band slippage CT finding, denies symptoms. Encouraged her to call her surgeons office for follow up, she reports she "forgot she had it" and would like it taken out.    General Counseling: gilliana pundt understanding of the findings of todays visit and agrees with plan of treatment. I have discussed any further diagnostic evaluation that may be needed or ordered today. We also reviewed her medications today. she has been encouraged to call the office with any questions or concerns that should arise related to todays visit.    Orders Placed This Encounter  Procedures  . Ambulatory referral to Urology    No orders of the defined types were placed in this encounter.   Time spent: 25 Minutes   This patient was seen by Orson Gear AGNP-C in Collaboration with Dr Lavera Guise as a part of collaborative care agreement     Kendell Bane AGNP-C Internal medicine

## 2019-07-12 NOTE — Progress Notes (Signed)
Did she see you today? I would refer her to urology.

## 2019-07-17 ENCOUNTER — Other Ambulatory Visit: Payer: Self-pay | Admitting: Adult Health

## 2019-07-17 ENCOUNTER — Other Ambulatory Visit: Payer: Self-pay | Admitting: Internal Medicine

## 2019-07-17 DIAGNOSIS — R918 Other nonspecific abnormal finding of lung field: Secondary | ICD-10-CM

## 2019-07-18 ENCOUNTER — Ambulatory Visit (INDEPENDENT_AMBULATORY_CARE_PROVIDER_SITE_OTHER): Payer: PPO | Admitting: Adult Health

## 2019-07-18 ENCOUNTER — Encounter: Payer: Self-pay | Admitting: Adult Health

## 2019-07-18 ENCOUNTER — Other Ambulatory Visit: Payer: Self-pay

## 2019-07-18 VITALS — BP 138/79 | HR 97 | Temp 97.3°F | Resp 16 | Ht 63.0 in | Wt 198.0 lb

## 2019-07-18 DIAGNOSIS — R918 Other nonspecific abnormal finding of lung field: Secondary | ICD-10-CM | POA: Diagnosis not present

## 2019-07-18 DIAGNOSIS — N390 Urinary tract infection, site not specified: Secondary | ICD-10-CM

## 2019-07-18 DIAGNOSIS — Z6835 Body mass index (BMI) 35.0-35.9, adult: Secondary | ICD-10-CM

## 2019-07-18 DIAGNOSIS — R739 Hyperglycemia, unspecified: Secondary | ICD-10-CM | POA: Diagnosis not present

## 2019-07-18 DIAGNOSIS — E785 Hyperlipidemia, unspecified: Secondary | ICD-10-CM | POA: Diagnosis not present

## 2019-07-18 DIAGNOSIS — E1165 Type 2 diabetes mellitus with hyperglycemia: Secondary | ICD-10-CM | POA: Diagnosis not present

## 2019-07-18 DIAGNOSIS — R300 Dysuria: Secondary | ICD-10-CM | POA: Diagnosis not present

## 2019-07-18 DIAGNOSIS — N200 Calculus of kidney: Secondary | ICD-10-CM

## 2019-07-18 DIAGNOSIS — I10 Essential (primary) hypertension: Secondary | ICD-10-CM | POA: Diagnosis not present

## 2019-07-18 DIAGNOSIS — K9509 Other complications of gastric band procedure: Secondary | ICD-10-CM

## 2019-07-18 LAB — POCT URINALYSIS DIPSTICK
Bilirubin, UA: NEGATIVE
Blood, UA: NEGATIVE
Glucose, UA: NEGATIVE
Ketones, UA: NEGATIVE
Nitrite, UA: POSITIVE
Protein, UA: NEGATIVE
Spec Grav, UA: 1.01 (ref 1.010–1.025)
Urobilinogen, UA: 0.2 E.U./dL
pH, UA: 6 (ref 5.0–8.0)

## 2019-07-18 LAB — POCT GLYCOSYLATED HEMOGLOBIN (HGB A1C): Hemoglobin A1C: 5.5 % (ref 4.0–5.6)

## 2019-07-18 MED ORDER — ROSUVASTATIN CALCIUM 5 MG PO TABS: 5.0000 mg | ORAL_TABLET | ORAL | 1 refills | Status: DC

## 2019-07-18 MED ORDER — NITROFURANTOIN MONOHYD MACRO 100 MG PO CAPS: 100.0000 mg | ORAL_CAPSULE | Freq: Two times a day (BID) | ORAL | 0 refills | Status: DC

## 2019-07-18 MED ORDER — PHENAZOPYRIDINE HCL 100 MG PO TABS: 100.0000 mg | ORAL_TABLET | Freq: Three times a day (TID) | ORAL | 0 refills | Status: DC | PRN

## 2019-07-18 NOTE — Progress Notes (Signed)
[facility] [place], [place] [address]  Internal MEDICINE  Office Visit Note  Patient Name: Regina Deleon  [id number]  [id number]  Date of Service: 07/18/2019  Chief Complaint  Patient presents with  . Follow-up  . Gastroesophageal Reflux  . Hyperlipidemia  . Hypertension    HPI  Pt is here for follow up on UTI, as well as GERD, HLd, HTN.  Pt was diagnosed with uti recently and completed course of bactrim.  She continues have urinary frequency, and hesitancy.  Her urine dip today shows leukocytes and nitrates. She denies fever or flank pain currently. Her blood pressre, Gerd and HLd are well controlled currently. The Ct also showed that her gastric band had slipped, and she called her surgeon and has an appt to be seen.   Current Medication: Outpatient Encounter Medications as of 07/18/2019  Medication Sig  . acetaminophen (TYLENOL) 500 MG tablet Take 500 mg by mouth every 6 (six) hours as needed for mild pain.  Marland Kitchen aspirin 81 MG tablet Take 81 mg by mouth daily.   . Cholecalciferol (VITAMIN D-3) 125 MCG (5000 UT) TABS Take 5,000 mg by mouth daily.  . cyclobenzaprine (FLEXERIL) 10 MG tablet Take 1 tablet (10 mg total) by mouth 3 (three) times daily as needed for muscle spasms.  . diclofenac Sodium (VOLTAREN) 1 % GEL APPLY 4 GRAMS TOPICALLY 4 (FOUR) TIMES DAILY. (Patient taking differently: Apply 2 g topically daily as needed (PAIN). )  . docusate sodium (COLACE) 100 MG capsule Take 200 mg by mouth daily.  . dorzolamide-timolol (COSOPT) 22.3-6.8 MG/ML ophthalmic solution Place 1 drop into both eyes 2 (two) times daily.  Marland Kitchen gabapentin (NEURONTIN) 100 MG capsule Take 1 capsule po qam and two capsules po QPM (Patient taking differently: Take 100 mg by mouth at bedtime as needed (pain in the right foot). )  . ibuprofen (ADVIL) 800 MG tablet Take 1 tablet (800 mg total) by mouth 2 (two) times daily as needed.  . latanoprost (XALATAN) 0.005 % ophthalmic solution  Place 1 drop into both eyes at bedtime.  Marland Kitchen NIFEdipine (PROCARDIA-XL/NIFEDICAL-XL) 30 MG 24 hr tablet Take 1 tablet (30 mg total) by mouth 2 (two) times daily.  . Omega 3 1000 MG CAPS Take 1,000 capsules by mouth daily.   . traMADol (ULTRAM) 50 MG tablet Take 1 tablet (50 mg total) by mouth every 8 (eight) hours as needed for moderate pain.  Marland Kitchen triamterene-hydrochlorothiazide (MAXZIDE-25) 37.5-25 MG tablet Take 1 tablet by mouth daily.  Marland Kitchen zolpidem (AMBIEN) 10 MG tablet Take 1 tablet (10 mg total) by mouth at bedtime as needed for sleep.  . [DISCONTINUED] sulfamethoxazole-trimethoprim (BACTRIM DS) 800-160 MG tablet Take 1 tablet by mouth 2 (two) times daily.   No facility-administered encounter medications on file as of 07/18/2019.    Surgical History: Past Surgical History:  Procedure Laterality Date  . ABDOMINAL HYSTERECTOMY  1986  . AXILLARY LYMPH NODE DISSECTION Left 06/26/2014   Procedure: AXILLARY LYMPH NODE DISSECTION;  Surgeon: Alphonsa Overall, MD;  Location: [place];  Service: General;  Laterality: Left;  . [place]   lumbar  . BREAST SURGERY Left 06/2014  . CATARACT EXTRACTION W/PHACO Right 03/23/2019   Procedure: CATARACT EXTRACTION PHACO AND INTRAOCULAR LENS PLACEMENT ([place]) RIGHT VISION BLUE;  Surgeon: Marchia Meiers, MD;  Location: [facility];  Service: Ophthalmology;  Laterality: Right;  [place] 00:55.4 CDE 5.92 Fluid Pack lot # [id number]  . COLONOSCOPY    .  COLONOSCOPY WITH PROPOFOL N/A 12/29/2017   Procedure: COLONOSCOPY WITH PROPOFOL;  Surgeon: Toledo, Benay Pike, MD;  Location: [facility];  Service: Gastroenterology;  Laterality: N/A;  . DILATION AND CURETTAGE OF UTERUS    . LAPAROSCOPIC GASTRIC BANDING  10/19/05  . MASTECTOMY Left 2016  . MASTECTOMY MODIFIED RADICAL Left 06/26/2014   Procedure: MASTECTOMY MODIFIED RADICAL;  Surgeon: Alphonsa Overall, MD;  Location: [place];  Service: General;  Laterality: Left;  . PORT A CATH REVISION      insertion-10/15  . PORT-A-CATH REMOVAL N/A 02/07/2015   Procedure: MINOR REMOVAL PORT-A-CATH;  Surgeon: Alphonsa Overall, MD;  Location: [place];  Service: General;  Laterality: N/A;    Medical History: Past Medical History:  Diagnosis Date  . Cancer ([place])    lt br cancer  . Edema leg    with chemo  . GERD (gastroesophageal reflux disease)    also, diverticulosis  . History of kidney stones   . Hyperlipidemia   . Hypertension   . Neuropathy    hands and feet post chemo  . Wears glasses     Family History: Family History  Problem Relation Age of Onset  . Asthma Mother   . Diabetes Mother   . Diabetes Father   . Heart disease Father   . Cancer Maternal Aunt   . Cancer Cousin   . Cancer Cousin   . Cancer Cousin     Social History   Socioeconomic History  . Marital status: Married    Spouse name: [place] Bells  . Number of children: Not on file  . Years of education: Not on file  . Highest education level: Not on file  Occupational History  . Not on file  Tobacco Use  . Smoking status: Never Smoker  . Smokeless tobacco: Never Used  Substance and Sexual Activity  . Alcohol use: No  . Drug use: No  . Sexual activity: Yes  Other Topics Concern  . Not on file  Social History Narrative  . Not on file   Social Determinants of Health   Financial Resource Strain:   . Difficulty of Paying Living Expenses:   Food Insecurity:   . Worried About Charity fundraiser in the Last Year:   . Arboriculturist in the Last Year:   Transportation Needs:   . Film/video editor (Medical):   Marland Kitchen Lack of Transportation (Non-Medical):   Physical Activity:   . Days of Exercise per Week:   . Minutes of Exercise per Session:   Stress:   . Feeling of Stress :   Social Connections:   . Frequency of Communication with Friends and Family:   . Frequency of Social Gatherings with Friends and Family:   . Attends Religious Services:   . Active Member of Clubs or  Organizations:   . Attends Archivist Meetings:   Marland Kitchen Marital Status:   Intimate Partner Violence:   . Fear of Current or Ex-Partner:   . Emotionally Abused:   Marland Kitchen Physically Abused:   . Sexually Abused:       Review of Systems  Constitutional: Negative for chills, fatigue and unexpected weight change.  HENT: Negative for congestion, rhinorrhea, sneezing and sore throat.   Eyes: Negative for photophobia, pain and redness.  Respiratory: Negative for cough, chest tightness and shortness of breath.   Cardiovascular: Negative for chest pain and palpitations.  Gastrointestinal: Negative for abdominal pain, constipation, diarrhea, nausea and vomiting.  Endocrine: Negative.  Genitourinary: Negative for dysuria and frequency.  Musculoskeletal: Negative for arthralgias, back pain, joint swelling and neck pain.  Skin: Negative for rash.  Allergic/Immunologic: Negative.   Neurological: Negative for tremors and numbness.  Hematological: Negative for adenopathy. Does not bruise/bleed easily.  Psychiatric/Behavioral: Negative for behavioral problems and sleep disturbance. The patient is not nervous/anxious.     Vital Signs: BP 138/79   Pulse 97   Temp (!) 97.3 F (36.3 C)   Resp 16   Ht 5\' 3"  (1.6 m)   Wt 198 lb (89.8 kg)   LMP 06/02/1984   SpO2 95%   BMI 35.07 kg/m    Physical Exam Vitals and nursing note reviewed.  Constitutional:      General: She is not in acute distress.    Appearance: She is well-developed. She is not diaphoretic.  HENT:     Head: Normocephalic and atraumatic.     Mouth/Throat:     Pharynx: No oropharyngeal exudate.  Eyes:     Pupils: Pupils are equal, round, and reactive to light.  Neck:     Thyroid: No thyromegaly.     Vascular: No JVD.     Trachea: No tracheal deviation.  Cardiovascular:     Rate and Rhythm: Normal rate and regular rhythm.     Heart sounds: Normal heart sounds. No murmur. No friction rub. No gallop.   Pulmonary:      Effort: Pulmonary effort is normal. No respiratory distress.     Breath sounds: Normal breath sounds. No wheezing or rales.  Chest:     Chest wall: No tenderness.  Abdominal:     Palpations: Abdomen is soft.     Tenderness: There is no abdominal tenderness. There is no guarding.  Musculoskeletal:        General: Normal range of motion.     Cervical back: Normal range of motion and neck supple.  Lymphadenopathy:     Cervical: No cervical adenopathy.  Skin:    General: Skin is warm and dry.  Neurological:     Mental Status: She is alert and oriented to person, place, and time.     Cranial Nerves: No cranial nerve deficit.  Psychiatric:        Behavior: Behavior normal.        Thought Content: Thought content normal.        Judgment: Judgment normal.    Assessment/Plan: 1. Urinary tract infection without hematuria, site unspecified Advised patient to take entire course of antibiotics as prescribed with food. Pt should return to clinic in 7-10 days if symptoms fail to improve or new symptoms develop.  - CULTURE, URINE COMPREHENSIVE - nitrofurantoin, macrocrystal-monohydrate, (MACROBID) 100 MG capsule; Take 1 capsule (100 mg total) by mouth 2 (two) times daily.  Dispense: 14 capsule; Refill: 0 - phenazopyridine (PYRIDIUM) 100 MG tablet; Take 1 tablet (100 mg total) by mouth 3 (three) times daily as needed for pain.  Dispense: 10 tablet; Refill: 0  2. Renal stones Has appt with urology on 07/26/19  3. Elevated serum glucose 5.5 today.  - POCT HgB A1C  4. BMI 35.0-35.9,adult comorbidities of HTN and HLD.  5. Essential hypertension Controlled, continue current medications.   6. Hyperlipidemia, unspecified hyperlipidemia type Patient was on statin in past, had severe leg cramps.  Is willing to try 2-3 times a week. Continue to monitor. - rosuvastatin (CRESTOR) 5 MG tablet; Take 1 tablet (5 mg total) by mouth 3 (three) times a week.  Dispense: 30 tablet; Refill: 1  7. Pulmonary  nodules Has CT ordered, will have done before next appt.  8. Gastric band slippage Systems developer, has appt scheduled for evaluation  9. Dysuria - POCT Urinalysis Dipstick  General Counseling: Nonnie verbalizes understanding of the findings of todays visit and agrees with plan of treatment. I have discussed any further diagnostic evaluation that may be needed or ordered today. We also reviewed her medications today. she has been encouraged to call the office with any questions or concerns that should arise related to todays visit.    Orders Placed This Encounter  Procedures  . CULTURE, URINE COMPREHENSIVE  . POCT Urinalysis Dipstick  . POCT HgB A1C    No orders of the defined types were placed in this encounter.   Time spent: 25 Minutes   This patient was seen by Orson Gear AGNP-C in Collaboration with Dr Lavera Guise as a part of collaborative care agreement     Kendell Bane AGNP-C Internal medicine

## 2019-07-21 LAB — CULTURE, URINE COMPREHENSIVE

## 2019-07-26 ENCOUNTER — Ambulatory Visit: Payer: PPO | Admitting: Urology

## 2019-07-26 ENCOUNTER — Other Ambulatory Visit
Admission: RE | Admit: 2019-07-26 | Discharge: 2019-07-26 | Disposition: A | Discharge status: Home / Self Care | Payer: PPO | Source: Ambulatory Visit | Attending: Urology | Admitting: Urology

## 2019-07-26 ENCOUNTER — Other Ambulatory Visit: Payer: Self-pay

## 2019-07-26 ENCOUNTER — Encounter: Payer: Self-pay | Admitting: Urology

## 2019-07-26 ENCOUNTER — Other Ambulatory Visit: Payer: Self-pay | Admitting: Urology

## 2019-07-26 VITALS — BP 152/90 | HR 80 | Ht 63.0 in | Wt 198.0 lb

## 2019-07-26 DIAGNOSIS — N390 Urinary tract infection, site not specified: Secondary | ICD-10-CM

## 2019-07-26 DIAGNOSIS — Z853 Personal history of malignant neoplasm of breast: Secondary | ICD-10-CM | POA: Diagnosis not present

## 2019-07-26 DIAGNOSIS — K219 Gastro-esophageal reflux disease without esophagitis: Secondary | ICD-10-CM | POA: Diagnosis not present

## 2019-07-26 DIAGNOSIS — I10 Essential (primary) hypertension: Secondary | ICD-10-CM | POA: Diagnosis not present

## 2019-07-26 DIAGNOSIS — N200 Calculus of kidney: Secondary | ICD-10-CM

## 2019-07-26 DIAGNOSIS — N132 Hydronephrosis with renal and ureteral calculous obstruction: Secondary | ICD-10-CM | POA: Diagnosis not present

## 2019-07-26 DIAGNOSIS — Z9221 Personal history of antineoplastic chemotherapy: Secondary | ICD-10-CM | POA: Diagnosis not present

## 2019-07-26 DIAGNOSIS — R1030 Lower abdominal pain, unspecified: Secondary | ICD-10-CM | POA: Diagnosis present

## 2019-07-26 DIAGNOSIS — Z20822 Contact with and (suspected) exposure to covid-19: Secondary | ICD-10-CM | POA: Diagnosis not present

## 2019-07-26 LAB — SARS CORONAVIRUS 2 (TAT 6-24 HRS): SARS Coronavirus 2: NEGATIVE

## 2019-07-26 MED ORDER — FLUCONAZOLE 100 MG PO TABS: 100.0000 mg | ORAL_TABLET | Freq: Every day | ORAL | 0 refills | Status: DC

## 2019-07-26 MED ORDER — CIPROFLOXACIN HCL 500 MG PO TABS: 500.0000 mg | ORAL_TABLET | Freq: Two times a day (BID) | ORAL | 0 refills | Status: DC

## 2019-07-26 NOTE — Patient Instructions (Signed)
Laser Therapy for Kidney Stones Laser therapy for kidney stones is a procedure to break up small, hard mineral deposits that form in the kidney (kidney stones). The procedure is done using a device that produces a focused beam of light (laser). The laser breaks up kidney stones into pieces that are small enough to be passed out of the body through urination or removed from the body during the procedure. You may need laser therapy if you have kidney stones that are painful or block your urinary tract. This procedure is done by inserting a tube (ureteroscope) into your kidney through the urethral opening. The urethra is the part of the body that drains urine from the bladder. In women, the urethra opens above the vaginal opening. In men, the urethra opens at the tip of the penis. The ureteroscope is inserted through the urethra, and surgical instruments are moved through the bladder and the muscular tube that connects the kidney to the bladder (ureter) until they reach the kidney. Tell a health care provider about:  Any allergies you have.  All medicines you are taking, including vitamins, herbs, eye drops, creams, and over-the-counter medicines.  Any problems you or family members have had with anesthetic medicines.  Any blood disorders you have.  Any surgeries you have had.  Any medical conditions you have.  Whether you are pregnant or may be pregnant. What are the risks? Generally, this is a safe procedure. However, problems may occur, including:  Infection.  Bleeding.  Allergic reactions to medicines.  Damage to the urethra, bladder, or ureter.  Urinary tract infection (UTI).  Narrowing of the urethra (urethral stricture).  Difficulty passing urine.  Blockage of the kidney caused by a fragment of kidney stone. What happens before the procedure? Medicines  Ask your health care provider about: ? Changing or stopping your regular medicines. This is especially important if you  are taking diabetes medicines or blood thinners. ? Taking medicines such as aspirin and ibuprofen. These medicines can thin your blood. Do not take these medicines unless your health care provider tells you to take them. ? Taking over-the-counter medicines, vitamins, herbs, and supplements. Eating and drinking Follow instructions from your health care provider about eating and drinking, which may include:  8 hours before the procedure - stop eating heavy meals or foods, such as meat, fried foods, or fatty foods.  6 hours before the procedure - stop eating light meals or foods, such as toast or cereal.  6 hours before the procedure - stop drinking milk or drinks that contain milk.  2 hours before the procedure - stop drinking clear liquids. Staying hydrated Follow instructions from your health care provider about hydration, which may include:  Up to 2 hours before the procedure - you may continue to drink clear liquids, such as water, clear fruit juice, black coffee, and plain tea.  General instructions  You may have a physical exam before the procedure. You may also have tests, such as imaging tests and blood or urine tests.  If your ureter is too narrow, your health care provider may place a soft, flexible tube (stent) inside of it. The stent may be placed days or weeks before your laser therapy procedure.  Plan to have someone take you home from the hospital or clinic.  If you will be going home right after the procedure, plan to have someone stay with you for 24 hours.  Do not use any products that contain nicotine or tobacco for at least 4   weeks before the procedure. These products include cigarettes, e-cigarettes, and chewing tobacco. If you need help quitting, ask your health care provider.  Ask your health care provider: ? How your surgical site will be marked or identified. ? What steps will be taken to help prevent infection. These may include:  Removing hair at the surgery  site.  Washing skin with a germ-killing soap.  Taking antibiotic medicine. What happens during the procedure?   An IV will be inserted into one of your veins.  You will be given one or more of the following: ? A medicine to help you relax (sedative). ? A medicine to numb the area (local anesthetic). ? A medicine to make you fall asleep (general anesthetic).  A ureteroscope will be inserted into your urethra. The ureteroscope will send images to a video screen in the operating room to guide your surgeon to the area of your kidney that will be treated.  A small, flexible tube will be threaded through the ureteroscope and into your bladder and ureter, up to your kidney.  The laser device will be inserted into your kidney through the tube. Your surgeon will pulse the laser on and off to break up kidney stones.  A surgical instrument that has a tiny wire basket may be inserted through the tube into your kidney to remove the pieces of broken kidney stone. The procedure may vary among health care providers and hospitals. What happens after the procedure?  Your blood pressure, heart rate, breathing rate, and blood oxygen level will be monitored until you leave the hospital or clinic.  You will be given pain medicine as needed.  You may continue to receive antibiotics.  You may have a stent temporarily placed in your ureter.  Do not drive for 24 hours if you were given a sedative during your procedure.  You may be given a strainer to collect any stone fragments that you pass in your urine. Your health care provider may have these tested. Summary  Laser therapy for kidney stones is a procedure to break up kidney stones into pieces that are small enough to be passed out of the body through urination or removed during the procedure.  Follow instructions from your health care provider about eating and drinking before the procedure.  During the procedure, the ureteroscope will send images  to a video screen to guide your surgeon to the area of your kidney that will be treated.  Do not drive for 24 hours if you were given a sedative during your procedure. This information is not intended to replace advice given to you by your health care provider. Make sure you discuss any questions you have with your health care provider. Document Revised: 12/30/2017 Document Reviewed: 12/30/2017 Elsevier Patient Education  2020 Else[employer] Inc[employer]   Ureteral Stent Implantation  Ureteral stent implantation is a procedure to insert (implant) a flexible, soft, plastic tube (stent) into a ureter. Ureters are the tube-like parts of the body that drain urine from the kidneys. The stent supports the ureter while it heals and helps to drain urine. You may have a ureteral stent implanted after having a procedure to remove a blockage from the ureter (ureterolysis or pyeloplasty). You may also have a stent implanted to open the flow of urine when you have a blockage caused by a kidney stone, tumor, blood clot, or infection. You have two ureters, one on each side of the body. The ureters connect the kidneys to the organ that holds urine   until it passes out of the body (bladder). The stent is placed so that one end is in the kidney, and one end is in the bladder. The stent is usually taken out after your ureter has healed. Depending on your condition, you may have a stent for just a few weeks, or you may have a long-term stent that will need to be replaced every few months. Tell a health care provider about:  Any allergies you have.  All medicines you are taking, including vitamins, herbs, eye drops, creams, and over-the-counter medicines.  Any problems you or family members have had with anesthetic medicines.  Any blood disorders you have.  Any surgeries you have had.  Any medical conditions you have.  Whether you are pregnant or may be pregnant. What are the risks? Generally, this is a safe procedure.  However, problems may occur, including:  Infection.  Bleeding.  Allergic reactions to medicines.  Damage to other structures or organs. Tearing (perforation) of the ureter is possible.  Movement of the stent away from where it is placed during surgery (migration). What happens before the procedure? Medicines Ask your health care provider about:  Changing or stopping your regular medicines. This is especially important if you are taking diabetes medicines or blood thinners.  Taking medicines such as aspirin and ibuprofen. These medicines can thin your blood. Do not take these medicines unless your health care provider tells you to take them.  Taking over-the-counter medicines, vitamins, herbs, and supplements. Eating and drinking Follow instructions from your health care provider about eating and drinking, which may include:  8 hours before the procedure - stop eating heavy meals or foods, such as meat, fried foods, or fatty foods.  6 hours before the procedure - stop eating light meals or foods, such as toast or cereal.  6 hours before the procedure - stop drinking milk or drinks that contain milk.  2 hours before the procedure - stop drinking clear liquids. Staying hydrated Follow instructions from your health care provider about hydration, which may include:  Up to 2 hours before the procedure - you may continue to drink clear liquids, such as water, clear fruit juice, black coffee, and plain tea. General instructions  Do not drink alcohol.  Do not use any products that contain nicotine or tobacco for at least 4 weeks before the procedure. These products include cigarettes, e-cigarettes, and chewing tobacco. If you need help quitting, ask your health care provider.  You may have an exam or testing, such as imaging or blood tests.  Ask your health care provider what steps will be taken to help prevent infection. These may include: ? Removing hair at the surgery  site. ? Washing skin with a germ-killing soap. ? Taking antibiotic medicine.  Plan to have someone take you home from the hospital or clinic.  If you will be going home right after the procedure, plan to have someone with you for 24 hours. What happens during the procedure?  An IV will be inserted into one of your veins.  You may be given a medicine to help you relax (sedative).  You may be given a medicine to make you fall asleep (general anesthetic).  A thin, tube-shaped instrument with a light and tiny camera at the end (cystoscope) will be inserted into your urethra. The urethra is the tube that drains urine from the bladder out of the body. In men, the urethra opens at the end of the penis. In women, the urethra opens   in front of the vaginal opening.  The cystoscope will be passed into your bladder.  A thin wire (guide wire) will be passed through your bladder and into your ureter. This is used to guide the stent into your ureter.  The stent will be inserted into your ureter.  The guide wire and the cystoscope will be removed.  A flexible tube (catheter) may be inserted through your urethra so that one end is in your bladder. This helps to drain urine from your bladder. The procedure may vary among hospitals and health care providers. What happens after the procedure?  Your blood pressure, heart rate, breathing rate, and blood oxygen level will be monitored until you leave the hospital or clinic.  You may continue to receive medicine and fluids through an IV.  You may have some soreness or pain in your abdomen and urethra. Medicines will be available to help you.  You will be encouraged to get up and walk around as soon as you can.  You may have a catheter draining your urine.  You will have some blood in your urine.  Do not drive for 24 hours if you were given a sedative during your procedure. Summary  Ureteral stent implantation is a procedure to insert a flexible,  soft, plastic tube (stent) into a ureter.  You may have a stent implanted to support the ureter while it heals after a procedure or to open the flow of urine if there is a blockage.  Follow instructions from your health care provider about taking medicines and about eating and drinking before the procedure.  Depending on your condition, you may have a stent for just a few weeks, or you may have a long-term stent that will need to be replaced every few months. This information is not intended to replace advice given to you by your health care provider. Make sure you discuss any questions you have with your health care provider. Document Revised: 01/25/2018 Document Reviewed: 01/26/2018 Elsevier Patient Education  2020 Elsevier Inc.   

## 2019-07-26 NOTE — Progress Notes (Signed)
07/26/19 9:03 AM   Regina Deleon 05-29-1950 [id number]  CC: Right groin pain  HPI: I saw Regina Deleon in urology clinic today for evaluation of right-sided groin pain.  She is a 69 year old female with medical history notable for breast cancer who presents with 2 to 3 weeks of right-sided groin pain, as well as some intermittent left-sided flank pain.  She underwent a CT stone protocol on 07/11/2019 that showed a 5 mm right distal ureteral stone with minimal upstream hydronephrosis, as well as a larger 7 mm left renal pelvis stone.  She also was having some mild urinary symptoms of urgency, frequency, and occasional dysuria and on 07/18/2019 urine culture grew 4k E. coli and she was treated with culture appropriate nitrofurantoin with improvement in her symptoms.  She denies any fevers or chills.  She has 1 prior stone episode which she passed spontaneously a few years ago.  She reports a strong family history of stones in multiple family members.  Urinalysis today contaminated with greater than 10 epithelial cells, 6-10 WBCs, 0-2 RBCs, moderate bacteria, yeast present, nitrite negative.   PMH: Past Medical History:  Diagnosis Date  . Cancer ([place])    lt br cancer  . Edema leg    with chemo  . GERD (gastroesophageal reflux disease)    also, diverticulosis  . History of kidney stones   . Hyperlipidemia   . Hypertension   . Neuropathy    hands and feet post chemo  . Wears glasses     Surgical History: Past Surgical History:  Procedure Laterality Date  . ABDOMINAL HYSTERECTOMY  1986  . AXILLARY LYMPH NODE DISSECTION Left 06/26/2014   Procedure: AXILLARY LYMPH NODE DISSECTION;  Surgeon: Alphonsa Overall, MD;  Location: [facility];  Service: General;  Laterality: Left;  . [place]   lumbar  . BREAST SURGERY Left 06/2014  . CATARACT EXTRACTION W/PHACO Right 03/23/2019   Procedure: CATARACT EXTRACTION PHACO AND INTRAOCULAR LENS PLACEMENT ([place]) RIGHT VISION  BLUE;  Surgeon: Marchia Meiers, MD;  Location: [facility];  Service: Ophthalmology;  Laterality: Right;  [place] 00:55.4 CDE 5.92 Fluid Pack lot # [id number]  . COLONOSCOPY    . COLONOSCOPY WITH PROPOFOL N/A 12/29/2017   Procedure: COLONOSCOPY WITH PROPOFOL;  Surgeon: Toledo, Benay Pike, MD;  Location: [facility];  Service: Gastroenterology;  Laterality: N/A;  . DILATION AND CURETTAGE OF UTERUS    . LAPAROSCOPIC GASTRIC BANDING  10/19/05  . MASTECTOMY Left 2016  . MASTECTOMY MODIFIED RADICAL Left 06/26/2014   Procedure: MASTECTOMY MODIFIED RADICAL;  Surgeon: Alphonsa Overall, MD;  Location: [facility];  Service: General;  Laterality: Left;  . PORT A CATH REVISION     insertion-10/15  . PORT-A-CATH REMOVAL N/A 02/07/2015   Procedure: MINOR REMOVAL PORT-A-CATH;  Surgeon: Alphonsa Overall, MD;  Location: [facility];  Service: General;  Laterality: N/A;    Family History: Family History  Problem Relation Age of Onset  . Asthma Mother   . Diabetes Mother   . Diabetes Father   . Heart disease Father   . Cancer Maternal Aunt   . Cancer Cousin   . Cancer Cousin   . Cancer Cousin     Social History:  reports that she has never smoked. She has never used smokeless tobacco. She reports that she does not drink alcohol or use drugs.  Physical Exam: BP (!) 152/90 (BP Location: Right Wrist, Patient Position: Sitting, Cuff Size: Normal)  Pulse 80   Ht 5\' 3"  (1.6 m)   Wt 198 lb (89.8 kg)   LMP 06/02/1984   BMI 35.07 kg/m    Constitutional:  Alert and oriented, No acute distress. Cardiovascular: Regular rate and rhythm Respiratory: Clear to auscultation bilaterally GI: Abdomen is soft, nontender, nondistended, no abdominal masses GU: Right CVA tenderness  Laboratory Data: Reviewed Urine culture 07/18/2019: 4K E. coli Urinalysis today contaminated with greater than 10 epithelial cells, 6-10 WBCs, 0-2 RBCs, moderate bacteria, yeast present, nitrite negative  Pertinent  Imaging: I have personally reviewed the CT dated 07/11/2019, 5 mm right distal ureteral stone with minimal upstream hydronephrosis, larger 7 mm left renal pelvis stone likely causing intermittent obstruction.  Assessment & Plan:   In summary, she is a 69 year old female with a 5 mm right distal ureteral stone with minimal upstream hydroureteronephrosis and right-sided groin pain, as well as a larger 7 mm left renal pelvis stone likely causing some intermittent obstruction and left-sided flank pain.  She continues to make urine and has no fevers or chills worrisome for systemic infection.  Urinalysis is contaminated today.  She was recently treated for a possible UTI with only 4K E. coli in the urine with culture appropriate nitrofurantoin.  We discussed various treatment options for urolithiasis including observation with or without medical expulsive therapy, shockwave lithotripsy (SWL), ureteroscopy and laser lithotripsy with stent placement, and percutaneous nephrolithotomy.  We discussed that management is based on stone size, location, density, patient co-morbidities, and patient preference.   Stones <65mm in size have a >80% spontaneous passage rate. Data surrounding the use of tamsulosin for medical expulsive therapy is controversial, but meta analyses suggests it is most efficacious for distal stones between 5-40mm in size. Possible side effects include dizziness/lightheadedness, and retrograde ejaculation.  SWL has a lower stone free rate in a single procedure, but also a lower complication rate compared to ureteroscopy and avoids a stent and associated stent related symptoms. Possible complications include renal hematoma, steinstrasse, and need for additional treatment.  Ureteroscopy with laser lithotripsy and stent placement has a higher stone free rate than SWL in a single procedure, however increased complication rate including possible infection, ureteral injury, bleeding, and stent related  morbidity. Common stent related symptoms include dysuria, urgency/frequency, and flank pain.  She is a much better candidate for ureteroscopy with her bilateral stone disease.  After an extensive discussion of the risks and benefits of the above treatment options, the patient would like to proceed with bilateral ureteroscopy, laser lithotripsy, and stent placement.  Cipro and fluconazole x3 days for history of UTI, urine contaminated today and asymptomatic Schedule bilateral ureteroscopy, laser lithotripsy, stent placement this Friday  Keshun Berrett [place], MD 07/26/2019  [facility] [address] [place], [place] [address] [phone]

## 2019-07-27 ENCOUNTER — Other Ambulatory Visit: Payer: Self-pay

## 2019-07-27 LAB — URINALYSIS, COMPLETE
Bilirubin, UA: NEGATIVE
Glucose, UA: NEGATIVE
Ketones, UA: NEGATIVE
Nitrite, UA: NEGATIVE
Specific Gravity, UA: 1.025 (ref 1.005–1.030)
Urobilinogen, Ur: 0.2 mg/dL (ref 0.2–1.0)
pH, UA: 6 (ref 5.0–7.5)

## 2019-07-27 LAB — MICROSCOPIC EXAMINATION: Epithelial Cells (non renal): >10 /hpf — AB (ref 0–10)

## 2019-07-27 MED ORDER — FLUCONAZOLE 100 MG PO TABS: 100.0000 mg | ORAL_TABLET | Freq: Every day | ORAL | 0 refills | Status: DC

## 2019-07-28 ENCOUNTER — Other Ambulatory Visit: Payer: Self-pay

## 2019-07-28 ENCOUNTER — Encounter: Payer: Self-pay | Admitting: Urology

## 2019-07-28 ENCOUNTER — Encounter
Admission: RE | Disposition: A | Discharge status: Home / Self Care | Payer: Self-pay | Source: Home / Self Care | Attending: Urology

## 2019-07-28 ENCOUNTER — Ambulatory Visit
Admission: RE | Admit: 2019-07-28 | Discharge: 2019-07-28 | Disposition: A | Discharge status: Home / Self Care | Payer: PPO | Attending: Urology | Admitting: Urology

## 2019-07-28 ENCOUNTER — Ambulatory Visit: Payer: PPO | Admitting: Certified Registered Nurse Anesthetist

## 2019-07-28 ENCOUNTER — Ambulatory Visit: Payer: PPO

## 2019-07-28 DIAGNOSIS — N132 Hydronephrosis with renal and ureteral calculous obstruction: Secondary | ICD-10-CM | POA: Insufficient documentation

## 2019-07-28 DIAGNOSIS — I10 Essential (primary) hypertension: Secondary | ICD-10-CM | POA: Insufficient documentation

## 2019-07-28 DIAGNOSIS — Z853 Personal history of malignant neoplasm of breast: Secondary | ICD-10-CM | POA: Insufficient documentation

## 2019-07-28 DIAGNOSIS — Z9221 Personal history of antineoplastic chemotherapy: Secondary | ICD-10-CM | POA: Insufficient documentation

## 2019-07-28 DIAGNOSIS — Z20822 Contact with and (suspected) exposure to covid-19: Secondary | ICD-10-CM | POA: Insufficient documentation

## 2019-07-28 DIAGNOSIS — N200 Calculus of kidney: Secondary | ICD-10-CM | POA: Diagnosis not present

## 2019-07-28 DIAGNOSIS — N202 Calculus of kidney with calculus of ureter: Secondary | ICD-10-CM | POA: Diagnosis not present

## 2019-07-28 DIAGNOSIS — K219 Gastro-esophageal reflux disease without esophagitis: Secondary | ICD-10-CM | POA: Insufficient documentation

## 2019-07-28 HISTORY — PX: CYSTOSCOPY/URETEROSCOPY/HOLMIUM LASER/STENT PLACEMENT: SHX6546

## 2019-07-28 LAB — CBC
HCT: 36.8 % (ref 36.0–46.0)
Hemoglobin: 11.8 g/dL — ABNORMAL LOW (ref 12.0–15.0)
MCH: 27.8 pg (ref 26.0–34.0)
MCHC: 32.1 g/dL (ref 30.0–36.0)
MCV: 86.8 fL (ref 80.0–100.0)
Platelets: 333 10*3/uL (ref 150–400)
RBC: 4.24 MIL/uL (ref 3.87–5.11)
RDW: 13.7 % (ref 11.5–15.5)
WBC: 8.9 10*3/uL (ref 4.0–10.5)
nRBC: 0 % (ref 0.0–0.2)

## 2019-07-28 LAB — BASIC METABOLIC PANEL
Anion gap: 11 (ref 5–15)
BUN: 16 mg/dL (ref 8–23)
CO2: 25 mmol/L (ref 22–32)
Calcium: 10.4 mg/dL — ABNORMAL HIGH (ref 8.9–10.3)
Chloride: 101 mmol/L (ref 98–111)
Creatinine, Ser: 0.78 mg/dL (ref 0.44–1.00)
GFR calc Af Amer: >60 mL/min (ref >60)
GFR calc non Af Amer: >60 mL/min (ref >60)
Glucose, Bld: 105 mg/dL — ABNORMAL HIGH (ref 70–99)
Potassium: 3.5 mmol/L (ref 3.5–5.1)
Sodium: 137 mmol/L (ref 135–145)

## 2019-07-28 SURGERY — CYSTOSCOPY/URETEROSCOPY/HOLMIUM LASER/STENT PLACEMENT
Anesthesia: General | Laterality: Bilateral

## 2019-07-28 MED ORDER — BELLADONNA ALKALOIDS-OPIUM 16.2-60 MG RE SUPP
RECTAL | Status: DC | PRN
  Administered 2019-07-28: 1 via RECTAL

## 2019-07-28 MED ORDER — ROCURONIUM BROMIDE 10 MG/ML (PF) SYRINGE
PREFILLED_SYRINGE | INTRAVENOUS | Status: AC
  Filled 2019-07-28: qty 10

## 2019-07-28 MED ORDER — ACETAMINOPHEN 10 MG/ML IV SOLN
INTRAVENOUS | Status: AC
  Filled 2019-07-28: qty 100

## 2019-07-28 MED ORDER — NITROFURANTOIN MACROCRYSTAL 100 MG PO CAPS: 100.0000 mg | ORAL_CAPSULE | Freq: Every day | ORAL | 0 refills | Status: DC

## 2019-07-28 MED ORDER — ONDANSETRON HCL 4 MG/2ML IJ SOLN
INTRAMUSCULAR | Status: DC | PRN
  Administered 2019-07-28: 4 mg via INTRAVENOUS

## 2019-07-28 MED ORDER — CIPROFLOXACIN IN D5W 400 MG/200ML IV SOLN
400.0000 mg | INTRAVENOUS | Status: AC
  Administered 2019-07-28: 10:00:00 400 mg via INTRAVENOUS

## 2019-07-28 MED ORDER — SUGAMMADEX SODIUM 200 MG/2ML IV SOLN
INTRAVENOUS | Status: DC | PRN
  Administered 2019-07-28 (×2): 179.6 mg via INTRAVENOUS

## 2019-07-28 MED ORDER — TAMSULOSIN HCL 0.4 MG PO CAPS: 0.4000 mg | ORAL_CAPSULE | Freq: Every day | ORAL | 0 refills | Status: DC

## 2019-07-28 MED ORDER — CIPROFLOXACIN IN D5W 400 MG/200ML IV SOLN
INTRAVENOUS | Status: AC
  Filled 2019-07-28: qty 200

## 2019-07-28 MED ORDER — ROCURONIUM BROMIDE 100 MG/10ML IV SOLN
INTRAVENOUS | Status: DC | PRN
  Administered 2019-07-28: 50 mg via INTRAVENOUS

## 2019-07-28 MED ORDER — ACETAMINOPHEN 10 MG/ML IV SOLN
INTRAVENOUS | Status: DC | PRN
  Administered 2019-07-28: 1000 mg via INTRAVENOUS

## 2019-07-28 MED ORDER — KETOROLAC TROMETHAMINE 30 MG/ML IJ SOLN
INTRAMUSCULAR | Status: DC | PRN
  Administered 2019-07-28: 15 mg via INTRAVENOUS

## 2019-07-28 MED ORDER — MIDAZOLAM HCL 2 MG/2ML IJ SOLN
INTRAMUSCULAR | Status: DC | PRN
  Administered 2019-07-28 (×2): 1 mg via INTRAVENOUS

## 2019-07-28 MED ORDER — DEXAMETHASONE SODIUM PHOSPHATE 10 MG/ML IJ SOLN
INTRAMUSCULAR | Status: DC | PRN
  Administered 2019-07-28: 8 mg via INTRAVENOUS

## 2019-07-28 MED ORDER — FENTANYL CITRATE (PF) 100 MCG/2ML IJ SOLN
25.0000 ug | INTRAMUSCULAR | Status: DC | PRN
  Administered 2019-07-28 (×3): 25 ug via INTRAVENOUS

## 2019-07-28 MED ORDER — OXYBUTYNIN CHLORIDE ER 10 MG PO TB24: 10.0000 mg | ORAL_TABLET | Freq: Every day | ORAL | 0 refills | Status: DC | PRN

## 2019-07-28 MED ORDER — ONDANSETRON HCL 4 MG/2ML IJ SOLN: 4.0000 mg | Freq: Once | INTRAMUSCULAR | Status: DC | PRN

## 2019-07-28 MED ORDER — PROPOFOL 10 MG/ML IV BOLUS
INTRAVENOUS | Status: DC | PRN
  Administered 2019-07-28: 200 mg via INTRAVENOUS

## 2019-07-28 MED ORDER — OMEGA 3 1000 MG PO CAPS: 1.0000 | ORAL_CAPSULE | Freq: Every day | ORAL | 0 refills | Status: AC

## 2019-07-28 MED ORDER — FLUMAZENIL 0.5 MG/5ML IV SOLN
INTRAVENOUS | Status: DC | PRN
  Administered 2019-07-28: .2 mg via INTRAVENOUS

## 2019-07-28 MED ORDER — OXYCODONE HCL 5 MG PO TABS: 5.0000 mg | ORAL_TABLET | Freq: Three times a day (TID) | ORAL | 0 refills | Status: AC | PRN

## 2019-07-28 MED ORDER — KETOROLAC TROMETHAMINE 30 MG/ML IJ SOLN
INTRAMUSCULAR | Status: AC
  Filled 2019-07-28: qty 1

## 2019-07-28 MED ORDER — LACTATED RINGERS IV SOLN: INTRAVENOUS | Status: DC

## 2019-07-28 MED ORDER — PHENYLEPHRINE HCL (PRESSORS) 10 MG/ML IV SOLN
INTRAVENOUS | Status: DC | PRN
  Administered 2019-07-28 (×4): 100 ug via INTRAVENOUS

## 2019-07-28 MED ORDER — IOHEXOL 180 MG/ML  SOLN
INTRAMUSCULAR | Status: DC | PRN
  Administered 2019-07-28: 20 mL

## 2019-07-28 MED ORDER — FENTANYL CITRATE (PF) 100 MCG/2ML IJ SOLN
INTRAMUSCULAR | Status: DC | PRN
  Administered 2019-07-28 (×2): 50 ug via INTRAVENOUS

## 2019-07-28 MED ORDER — FLUMAZENIL 0.5 MG/5ML IV SOLN
INTRAVENOUS | Status: AC
  Filled 2019-07-28: qty 5

## 2019-07-28 MED ORDER — MIDAZOLAM HCL 2 MG/2ML IJ SOLN
INTRAMUSCULAR | Status: AC
  Filled 2019-07-28: qty 2

## 2019-07-28 MED ORDER — OXYCODONE HCL 5 MG PO TABS: 5.0000 mg | ORAL_TABLET | Freq: Once | ORAL | Status: DC

## 2019-07-28 MED ORDER — FENTANYL CITRATE (PF) 100 MCG/2ML IJ SOLN
INTRAMUSCULAR | Status: AC
  Administered 2019-07-28: 25 ug via INTRAVENOUS
  Filled 2019-07-28: qty 2

## 2019-07-28 MED ORDER — FENTANYL CITRATE (PF) 100 MCG/2ML IJ SOLN
INTRAMUSCULAR | Status: AC
  Filled 2019-07-28: qty 2

## 2019-07-28 SURGICAL SUPPLY — 33 items
BAG DRAIN CYSTO-URO LG1000N (MISCELLANEOUS) ×3 IMPLANT
BRUSH SCRUB EZ 1% IODOPHOR (MISCELLANEOUS) ×3 IMPLANT
CATH URETL 5X70 OPEN END (CATHETERS) IMPLANT
CNTNR SPEC 2.5X3XGRAD LEK (MISCELLANEOUS)
CONT SPEC 4OZ STER OR WHT (MISCELLANEOUS)
CONT SPEC 4OZ STRL OR WHT (MISCELLANEOUS)
CONTAINER SPEC 2.5X3XGRAD LEK (MISCELLANEOUS) IMPLANT
DRAPE UTILITY 15X26 TOWEL STRL (DRAPES) ×3 IMPLANT
FIBER LASER TRAC TIP (UROLOGICAL SUPPLIES) IMPLANT
GLOVE BIOGEL PI IND STRL 7.5 (GLOVE) ×1 IMPLANT
GLOVE BIOGEL PI INDICATOR 7.5 (GLOVE) ×4
GLOVE PROTEXIS LATEX SZ 7.5 (GLOVE) ×9 IMPLANT
GLOVE SURG LATEX 7.5 PF (GLOVE) IMPLANT
GOWN STRL REUS W/ TWL LRG LVL3 (GOWN DISPOSABLE) ×1 IMPLANT
GOWN STRL REUS W/ TWL XL LVL3 (GOWN DISPOSABLE) ×1 IMPLANT
GOWN STRL REUS W/TWL LRG LVL3 (GOWN DISPOSABLE) ×6
GOWN STRL REUS W/TWL XL LVL3 (GOWN DISPOSABLE) ×3
GUIDEWIRE STR DUAL SENSOR (WIRE) ×3 IMPLANT
INFUSOR MANOMETER BAG 3000ML (MISCELLANEOUS) ×3 IMPLANT
INTRODUCER DILATOR DOUBLE (INTRODUCER) IMPLANT
KIT TURNOVER CYSTO (KITS) ×3 IMPLANT
PACK CYSTO AR (MISCELLANEOUS) ×3 IMPLANT
SET CYSTO W/LG BORE CLAMP LF (SET/KITS/TRAYS/PACK) ×3 IMPLANT
SHEATH URETERAL 12FRX35CM (MISCELLANEOUS) IMPLANT
SOL .9 NS 3000ML IRR  AL (IV SOLUTION) ×3
SOL .9 NS 3000ML IRR AL (IV SOLUTION) ×1
SOL .9 NS 3000ML IRR UROMATIC (IV SOLUTION) ×1 IMPLANT
STENT URET 6FRX24 CONTOUR (STENTS) ×2 IMPLANT
STENT URET 6FRX26 CONTOUR (STENTS) IMPLANT
SURGILUBE 2OZ TUBE FLIPTOP (MISCELLANEOUS) ×3 IMPLANT
SYR 10ML LL (SYRINGE) ×3 IMPLANT
VALVE UROSEAL ADJ ENDO (VALVE) IMPLANT
WATER STERILE IRR 1000ML POUR (IV SOLUTION) ×3 IMPLANT

## 2019-07-28 NOTE — Transfer of Care (Signed)
Immediate Anesthesia Transfer of Care Note  Patient: Regina Deleon  Procedure(s) Performed: CYSTOSCOPY/URETEROSCOPY/HOLMIUM LASER/STENT PLACEMENT (Bilateral )  Patient Location: PACU  Anesthesia Type:General  Level of Consciousness: awake, alert  and oriented  Airway & Oxygen Therapy: Patient Spontanous Breathing and Patient connected to face mask oxygen  Post-op Assessment: Report given to RN and Post -op Vital signs reviewed and stable  Post vital signs: Reviewed and stable  Last Vitals:  Vitals Value Taken Time  BP    Temp    Pulse 91 07/28/19 1059  Resp 15 07/28/19 1059  SpO2 99 % 07/28/19 1059  Vitals shown include unvalidated device data.  Last Pain:  Vitals:   07/28/19 0843  TempSrc: Oral  PainSc: 0-No pain         Complications: No apparent anesthesia complications

## 2019-07-28 NOTE — Anesthesia Procedure Notes (Signed)
Procedure Name: Intubation Performed by: Demetrius Charity, CRNA Pre-anesthesia Checklist: Patient identified, Patient being monitored, Timeout performed, Emergency Drugs available and Suction available Patient Re-evaluated:Patient Re-evaluated prior to induction Oxygen Delivery Method: Circle system utilized Preoxygenation: Pre-oxygenation with 100% oxygen Induction Type: IV induction Ventilation: Mask ventilation without difficulty Laryngoscope Size: 3 and McGraph Grade View: Grade II Tube type: Oral Tube size: 7.0 mm Number of attempts: 1 Airway Equipment and Method: Stylet and Video-laryngoscopy Placement Confirmation: ETT inserted through vocal cords under direct vision,  positive ETCO2 and breath sounds checked- equal and bilateral Secured at: 23 cm Tube secured with: Tape Dental Injury: Teeth and Oropharynx as per pre-operative assessment

## 2019-07-28 NOTE — Discharge Instructions (Signed)

## 2019-07-28 NOTE — Anesthesia Preprocedure Evaluation (Addendum)
Anesthesia Evaluation  Patient identified by MRN, date of birth, ID band Patient awake    Reviewed: Allergy & Precautions, NPO status , Patient's Chart, lab work & pertinent test results  History of Anesthesia Complications Negative for: history of anesthetic complications  Airway Mallampati: II       Dental   Pulmonary neg sleep apnea, neg COPD, Not current smoker,           Cardiovascular hypertension, Pt. on medications (-) Past MI and (-) CHF (-) dysrhythmias (-) Valvular Problems/Murmurs     Neuro/Psych neg Seizures    GI/Hepatic Neg liver ROS, GERD  Medicated and Controlled,  Endo/Other  neg diabetes  Renal/GU negative Renal ROS     Musculoskeletal   Abdominal   Peds  Hematology   Anesthesia Other Findings   Reproductive/Obstetrics                            Anesthesia Physical Anesthesia Plan  ASA: II  Anesthesia Plan: General   Post-op Pain Management:    Induction:   PONV Risk Score and Plan: 3 and Ondansetron, Dexamethasone and Midazolam  Airway Management Planned: Oral ETT  Additional Equipment:   Intra-op Plan:   Post-operative Plan:   Informed Consent: I have reviewed the patients History and Physical, chart, labs and discussed the procedure including the risks, benefits and alternatives for the proposed anesthesia with the patient or authorized representative who has indicated his/her understanding and acceptance.       Plan Discussed with:   Anesthesia Plan Comments:         Anesthesia Quick Evaluation

## 2019-07-28 NOTE — Op Note (Signed)
Date of procedure: 07/28/19  Preoperative diagnosis:  1. Right renal stone 2. Left renal stone  Postoperative diagnosis:  1. Same  Procedure: 1. Cystoscopy, bilateral retrograde pyelograms with intraoperative interpretation 2. Right ureteroscopy, laser lithotripsy 3. Left ureteroscopy, laser lithotripsy, left ureteral stent placement  Surgeon: Nickolas Madrid, MD  Anesthesia: General  Complications: None  Intraoperative findings:  1.  Normal cystoscopy 2.  Right distal ureteral stone had passed spontaneously, 99mm right renal stone dusted 3.  Left ureteroscopy and laser lithotripsy of 7 mm renal pelvis stone and lower pole stone 4.  Contrast drained briskly from right kidney, uncomplicated left ureteral stent placement  EBL: None  Specimens: None  Drains: Left 6 French by 24 cm ureteral stent  Indication: Regina Deleon is a 69 y.o. patient with intermittent bilateral flank pain and a 5 mm right distal ureteral stone as well as a 7 mm left renal pelvis stone on CT.  After reviewing the management options for treatment, they elected to proceed with the above surgical procedure(s). We have discussed the potential benefits and risks of the procedure, side effects of the proposed treatment, the likelihood of the patient achieving the goals of the procedure, and any potential problems that might occur during the procedure or recuperation. Informed consent has been obtained.  Description of procedure:  The patient was taken to the operating room and general anesthesia was induced. SCDs were placed for DVT prophylaxis. The patient was placed in the dorsal lithotomy position, prepped and draped in the usual sterile fashion, and preoperative antibiotics were administered. A preoperative time-out was performed.   The 21 French rigid cystoscope was used to intubate the urethra and thorough cystoscopy was performed.  The bladder was grossly normal and the ureteral orifices were orthotopic  bilaterally.  I started by advancing a sensor wire into the right ureteral orifice and is advanced easily up into the right kidney under fluoroscopic vision.  A semirigid ureteroscope was advanced alongside the wire and there were no stone seen in the distal or mid ureter.  A single channel flexible ureteroscope then advanced easily over the wire up into the kidney and thorough pyeloscopy revealed a solitary 4 mm stone in the midpole.  This was fragmented to dust using the 200 m laser fiber on settings of 0.5 J and 20 Hz.  Thorough pyeloscopy revealed no other fragments.  I retrograde pyelogram was performed through the scope and showed no extravasation or filling defects.  Careful pullback ureteroscopy revealed a widely patent ureter with no residual fragments or edema.    I then turned my attention to the left side and a sensor wire was advanced into the left kidney under fluoroscopic vision.  The single-channel flexible ureteroscope again advanced easily over the wire up into the kidney.  There was a large 7 mm renal pelvis stone, as well as a smaller 4 mm lower pole stone.  They were both fragmented to dust using a 200 m laser fiber on settings of 0.5 J and 20 Hz.  There were no residual fragments at the conclusion.  Contrast was injected through the scope for retrograde pyelogram and showed no extravasation or filling defects.  A sensor wire was replaced through the scope, and careful pullback ureteroscopy revealed no ureteral injuries or residual fragments.  On x-ray, the contrast from the right kidney had drained, and I elected to not place a right-sided stent.  With the largest on the left side, a 6 [place] by 24 cm left ureteral  stent was uneventfully placed under fluoroscopic vision with an excellent curl in the renal pelvis, as well as in the bladder.  The obturator and sheath were used to drain the bladder and this concluded our procedure.  A belladonna suppository was placed.  Disposition: Stable  to PACU  Plan: Follow-up in 5 days for stent removal Nitrofurantoin prophylaxis while stent in place  Nickolas Madrid, MD

## 2019-07-28 NOTE — H&P (Signed)
UROLOGY H&P UPDATE  Agree with prior H&P dated 07/26/2019.  69 year old female with right-sided groin pain secondary to a 5 mm right distal ureteral stone, as well as intermittent left-sided pain secondary to a 7 mm left renal pelvis stone.  Presents today for definitive management with bilateral ureteroscopy and laser lithotripsy.  Cardiac: RRR Lungs: CTA bilaterally  Laterality: Bilateral Procedure: Bilateral ureteroscopy, laser lithotripsy, stent placement  Urine:  Culture 3/16 4K E. coli, treated with culture appropriate antibiotics Yeast on recent UA treated with fluconazole  We specifically discussed the risks ureteroscopy including bleeding, infection/sepsis, stent related symptoms including flank pain/urgency/frequency/incontinence/dysuria, ureteral injury, inability to access stone, or need for staged or additional procedures.  We also discussed the need for possible temporary stent placement if any signs of purulence or infection, with need for follow-up procedure for definitive management.   Billey Co, MD 07/28/2019

## 2019-07-28 NOTE — OR Nursing (Signed)
Per Dr. Diamantina Providence secure chat, patient may restart aspirin tomorrow 07/29/19.  Added to discharge instructions.

## 2019-07-31 ENCOUNTER — Ambulatory Visit: Payer: PPO

## 2019-07-31 ENCOUNTER — Telehealth: Payer: Self-pay

## 2019-07-31 ENCOUNTER — Other Ambulatory Visit: Payer: Self-pay

## 2019-07-31 DIAGNOSIS — I10 Essential (primary) hypertension: Secondary | ICD-10-CM

## 2019-07-31 MED ORDER — NIFEDIPINE ER OSMOTIC RELEASE 30 MG PO TB24: 30.0000 mg | ORAL_TABLET | Freq: Two times a day (BID) | ORAL | 0 refills | Status: DC

## 2019-07-31 MED ORDER — ONDANSETRON HCL 4 MG PO TABS: 4.0000 mg | ORAL_TABLET | Freq: Three times a day (TID) | ORAL | 0 refills | Status: AC | PRN

## 2019-07-31 NOTE — Telephone Encounter (Signed)
Patient called stating she is not feeling well. She is having flank pain and nausea as well as dark urine. It was explained that these symptoms are most likely due to stent discomfort. She was encouraged to utilize oxybutinin and tamsulosin and increase water intake. Patient states she had not taken any medications today due to nausea. Per Dr. Diamantina Providence zofran was sent to patient's pharmacy to help with nausea

## 2019-07-31 NOTE — Anesthesia Postprocedure Evaluation (Signed)
Anesthesia Post Note  Patient: Regina Deleon  Procedure(s) Performed: CYSTOSCOPY/URETEROSCOPY/HOLMIUM LASER/STENT PLACEMENT (Bilateral )  Patient location during evaluation: PACU Anesthesia Type: General Level of consciousness: awake and alert Pain management: pain level controlled Vital Signs Assessment: post-procedure vital signs reviewed and stable Respiratory status: spontaneous breathing, nonlabored ventilation, respiratory function stable and patient connected to nasal cannula oxygen Cardiovascular status: blood pressure returned to baseline and stable Postop Assessment: no apparent nausea or vomiting Anesthetic complications: no     Last Vitals:  Vitals:   07/28/19 1229 07/28/19 1318  BP: (!) 104/55 (!) 110/57  Pulse: 85 89  Resp: 16 16  Temp: (!) 36.2 C   SpO2: 93% 95%    Last Pain:  Vitals:   07/31/19 0824  TempSrc:   PainSc: 6                  Molli Barrows

## 2019-08-02 ENCOUNTER — Other Ambulatory Visit: Payer: Self-pay

## 2019-08-02 DIAGNOSIS — I10 Essential (primary) hypertension: Secondary | ICD-10-CM

## 2019-08-02 MED ORDER — NIFEDIPINE ER OSMOTIC RELEASE 30 MG PO TB24: 30.0000 mg | ORAL_TABLET | Freq: Two times a day (BID) | ORAL | 0 refills | Status: DC

## 2019-08-03 ENCOUNTER — Other Ambulatory Visit: Payer: Self-pay

## 2019-08-03 ENCOUNTER — Ambulatory Visit (INDEPENDENT_AMBULATORY_CARE_PROVIDER_SITE_OTHER): Payer: PPO | Admitting: Urology

## 2019-08-03 VITALS — BP 128/85 | HR 97

## 2019-08-03 DIAGNOSIS — N200 Calculus of kidney: Secondary | ICD-10-CM | POA: Diagnosis not present

## 2019-08-03 LAB — MICROSCOPIC EXAMINATION: RBC, Urine: >30 /hpf — AB (ref 0–2)

## 2019-08-03 LAB — URINALYSIS, COMPLETE
Specific Gravity, UA: 1.02 (ref 1.005–1.030)
pH, UA: 5.5 (ref 5.0–7.5)

## 2019-08-03 MED ORDER — OXYBUTYNIN CHLORIDE ER 10 MG PO TB24: 10.0000 mg | ORAL_TABLET | Freq: Every day | ORAL | 0 refills | Status: AC | PRN

## 2019-08-03 MED ORDER — CIPROFLOXACIN HCL 500 MG PO TABS
500.0000 mg | ORAL_TABLET | Freq: Once | ORAL | Status: AC
  Administered 2019-08-03: 500 mg via ORAL

## 2019-08-03 NOTE — Progress Notes (Signed)
Cystoscopy Procedure Note:  Indication: Stent removal s/p bilateral URS/LL/stent, stent only placed on left side  Cipro given for antibiotic prophylaxis  After informed consent and discussion of the procedure and its risks, Regina Deleon was positioned and prepped in the standard fashion. Cystoscopy was performed with a flexible cystoscope. The stent was grasped with flexible graspers and removed in its entirety. The patient tolerated the procedure well.  Findings: Uncomplicated stent removal  Assessment and Plan: We discussed general stone prevention strategies including adequate hydration with goal of producing 2.5 L of urine daily, increasing citric acid intake, increasing calcium intake during high oxalate meals, minimizing animal protein, and decreasing salt intake. Information about dietary recommendations given today.   RTC 6 mo with KUB   Billey Co, MD 08/03/2019

## 2019-08-03 NOTE — Patient Instructions (Signed)
Dietary Guidelines to Help Prevent Kidney Stones Kidney stones are deposits of minerals and salts that form inside your kidneys. Your risk of developing kidney stones may be greater depending on your diet, your lifestyle, the medicines you take, and whether you have certain medical conditions. Most people can reduce their chances of developing kidney stones by following the instructions below. Depending on your overall health and the type of kidney stones you tend to develop, your dietitian may give you more specific instructions. What are tips for following this plan? Reading food labels  Choose foods with "no salt added" or "low-salt" labels. Limit your sodium intake to less than 1500 mg per day.  Choose foods with calcium for each meal and snack. Try to eat about 300 mg of calcium at each meal. Foods that contain 200-500 mg of calcium per serving include: ? 8 oz (237 ml) of milk, fortified nondairy milk, and fortified fruit juice. ? 8 oz (237 ml) of kefir, yogurt, and soy yogurt. ? 4 oz (118 ml) of tofu. ? 1 oz of cheese. ? 1 cup (300 g) of dried figs. ? 1 cup (91 g) of cooked broccoli. ? 1-3 oz can of sardines or mackerel.  Most people need 1000 to 1500 mg of calcium each day. Talk to your dietitian about how much calcium is recommended for you. Shopping  Buy plenty of fresh fruits and vegetables. Most people do not need to avoid fruits and vegetables, even if they contain nutrients that may contribute to kidney stones.  When shopping for convenience foods, choose: ? Whole pieces of fruit. ? Premade salads with dressing on the side. ? Low-fat fruit and yogurt smoothies.  Avoid buying frozen meals or prepared deli foods.  Look for foods with live cultures, such as yogurt and kefir. Cooking  Do not add salt to food when cooking. Place a salt shaker on the table and allow each person to add his or her own salt to taste.  Use vegetable protein, such as beans, textured vegetable  protein (TVP), or tofu instead of meat in pasta, casseroles, and soups. Meal planning   Eat less salt, if told by your dietitian. To do this: ? Avoid eating processed or premade food. ? Avoid eating fast food.  Eat less animal protein, including cheese, meat, poultry, or fish, if told by your dietitian. To do this: ? Limit the number of times you have meat, poultry, fish, or cheese each week. Eat a diet free of meat at least 2 days a week. ? Eat only one serving each day of meat, poultry, fish, or seafood. ? When you prepare animal protein, cut pieces into small portion sizes. For most meat and fish, one serving is about the size of one deck of cards.  Eat at least 5 servings of fresh fruits and vegetables each day. To do this: ? Keep fruits and vegetables on hand for snacks. ? Eat 1 piece of fruit or a handful of berries with breakfast. ? Have a salad and fruit at lunch. ? Have two kinds of vegetables at dinner.  Limit foods that are high in a substance called oxalate. These include: ? Spinach. ? Rhubarb. ? Beets. ? Potato chips and french fries. ? Nuts.  If you regularly take a diuretic medicine, make sure to eat at least 1-2 fruits or vegetables high in potassium each day. These include: ? Avocado. ? Banana. ? Orange, prune, carrot, or tomato juice. ? Baked potato. ? Cabbage. ? Beans and split   peas. General instructions   Drink enough fluid to keep your urine clear or pale yellow. This is the most important thing you can do.  Talk to your health care provider and dietitian about taking daily supplements. Depending on your health and the cause of your kidney stones, you may be advised: ? Not to take supplements with vitamin C. ? To take a calcium supplement. ? To take a daily probiotic supplement. ? To take other supplements such as magnesium, fish oil, or vitamin B6.  Take all medicines and supplements as told by your health care provider.  Limit alcohol intake to no  more than 1 drink a day for nonpregnant women and 2 drinks a day for men. One drink equals 12 oz of beer, 5 oz of wine, or 1 oz of hard liquor.  Lose weight if told by your health care provider. Work with your dietitian to find strategies and an eating plan that works best for you. What foods are not recommended? Limit your intake of the following foods, or as told by your dietitian. Talk to your dietitian about specific foods you should avoid based on the type of kidney stones and your overall health. Grains Breads. Bagels. Rolls. Baked goods. Salted crackers. Cereal. Pasta. Vegetables Spinach. Rhubarb. Beets. Canned vegetables. Pickles. Olives. Meats and other protein foods Nuts. Nut butters. Large portions of meat, poultry, or fish. Salted or cured meats. Deli meats. Hot dogs. Sausages. Dairy Cheese. Beverages Regular soft drinks. Regular vegetable juice. Seasonings and other foods Seasoning blends with salt. Salad dressings. Canned soups. Soy sauce. Ketchup. Barbecue sauce. Canned pasta sauce. Casseroles. Pizza. Lasagna. Frozen meals. Potato chips. French fries. Summary  You can reduce your risk of kidney stones by making changes to your diet.  The most important thing you can do is drink enough fluid. You should drink enough fluid to keep your urine clear or pale yellow.  Ask your health care provider or dietitian how much protein from animal sources you should eat each day, and also how much salt and calcium you should have each day. This information is not intended to replace advice given to you by your health care provider. Make sure you discuss any questions you have with your health care provider. Document Revised: 08/10/2018 Document Reviewed: 03/31/2016 Elsevier Patient Education  2020 [employer]  

## 2019-08-03 NOTE — Addendum Note (Signed)
Addended by: Tommy Rainwater on: 08/03/2019 10:02 AM   Modules accepted: Orders

## 2019-08-07 ENCOUNTER — Other Ambulatory Visit: Payer: Self-pay

## 2019-08-07 ENCOUNTER — Ambulatory Visit
Admission: RE | Admit: 2019-08-07 | Discharge: 2019-08-07 | Disposition: A | Discharge status: Home / Self Care | Payer: PPO | Source: Ambulatory Visit | Attending: Adult Health | Admitting: Adult Health

## 2019-08-07 DIAGNOSIS — R918 Other nonspecific abnormal finding of lung field: Secondary | ICD-10-CM | POA: Insufficient documentation

## 2019-08-08 DIAGNOSIS — C50912 Malignant neoplasm of unspecified site of left female breast: Secondary | ICD-10-CM | POA: Diagnosis not present

## 2019-08-10 ENCOUNTER — Ambulatory Visit: Payer: Self-pay | Admitting: Urology

## 2019-08-10 ENCOUNTER — Other Ambulatory Visit: Payer: Self-pay

## 2019-08-10 ENCOUNTER — Telehealth: Payer: Self-pay

## 2019-08-10 DIAGNOSIS — E041 Nontoxic single thyroid nodule: Secondary | ICD-10-CM

## 2019-08-10 NOTE — Telephone Encounter (Signed)
Pt called asking for her CT results.  Spoke with adam and per adam notified pt that the lung nodules are still on the small side and we will continue to monitor and follow up with another CT in 3-6 months. Also notified pt the CT also showed a nodule on her thyroid and that we want to do a thyroid ultrasound to further evaluate this. Notified pt that we will do the ultrasound in our office and we will call to get the ultrasound scheduled for her.

## 2019-08-11 ENCOUNTER — Other Ambulatory Visit: Payer: Self-pay

## 2019-08-11 DIAGNOSIS — G620 Drug-induced polyneuropathy: Secondary | ICD-10-CM

## 2019-08-11 MED ORDER — GABAPENTIN 100 MG PO CAPS: ORAL_CAPSULE | ORAL | 3 refills | Status: DC

## 2019-08-31 ENCOUNTER — Telehealth: Payer: Self-pay

## 2019-08-31 NOTE — Telephone Encounter (Signed)
CONFIRMED PATIENT ULTRASOUND APPT FOR 09/01/19

## 2019-09-01 ENCOUNTER — Other Ambulatory Visit: Payer: PPO

## 2019-09-01 DIAGNOSIS — Z9884 Bariatric surgery status: Secondary | ICD-10-CM | POA: Diagnosis not present

## 2019-09-01 DIAGNOSIS — C50412 Malignant neoplasm of upper-outer quadrant of left female breast: Secondary | ICD-10-CM | POA: Diagnosis not present

## 2019-09-18 ENCOUNTER — Other Ambulatory Visit: Payer: Self-pay | Admitting: Urology

## 2019-09-18 NOTE — Telephone Encounter (Signed)
Pt to continue med? Please advise.

## 2019-09-19 NOTE — Telephone Encounter (Signed)
Ok to refill  Nickolas Madrid, MD 09/19/2019

## 2019-10-12 ENCOUNTER — Other Ambulatory Visit: Payer: Self-pay

## 2019-10-12 DIAGNOSIS — M545 Low back pain, unspecified: Secondary | ICD-10-CM

## 2019-10-12 MED ORDER — IBUPROFEN 800 MG PO TABS: 800.0000 mg | ORAL_TABLET | Freq: Two times a day (BID) | ORAL | 2 refills | Status: DC | PRN

## 2019-10-16 ENCOUNTER — Telehealth: Payer: Self-pay

## 2019-10-16 NOTE — Telephone Encounter (Signed)
Confirmed appointment on 10/18/2019 and screened for covid. [facility]

## 2019-10-18 ENCOUNTER — Other Ambulatory Visit: Payer: Self-pay

## 2019-10-18 ENCOUNTER — Ambulatory Visit (INDEPENDENT_AMBULATORY_CARE_PROVIDER_SITE_OTHER): Payer: PPO | Admitting: Adult Health

## 2019-10-18 ENCOUNTER — Encounter: Payer: Self-pay | Admitting: Adult Health

## 2019-10-18 VITALS — BP 128/60 | HR 83 | Temp 97.9°F | Resp 16 | Ht 63.0 in | Wt 201.0 lb

## 2019-10-18 DIAGNOSIS — R918 Other nonspecific abnormal finding of lung field: Secondary | ICD-10-CM

## 2019-10-18 DIAGNOSIS — N200 Calculus of kidney: Secondary | ICD-10-CM

## 2019-10-18 DIAGNOSIS — I10 Essential (primary) hypertension: Secondary | ICD-10-CM | POA: Diagnosis not present

## 2019-10-18 DIAGNOSIS — F5101 Primary insomnia: Secondary | ICD-10-CM | POA: Diagnosis not present

## 2019-10-18 DIAGNOSIS — E041 Nontoxic single thyroid nodule: Secondary | ICD-10-CM

## 2019-10-18 DIAGNOSIS — M5431 Sciatica, right side: Secondary | ICD-10-CM | POA: Diagnosis not present

## 2019-10-18 DIAGNOSIS — K9509 Other complications of gastric band procedure: Secondary | ICD-10-CM

## 2019-10-18 MED ORDER — ZOLPIDEM TARTRATE 10 MG PO TABS: 10.0000 mg | ORAL_TABLET | Freq: Every evening | ORAL | 2 refills | Status: DC | PRN

## 2019-10-18 MED ORDER — TRAMADOL HCL 50 MG PO TABS: 50.0000 mg | ORAL_TABLET | Freq: Three times a day (TID) | ORAL | 0 refills | Status: DC | PRN

## 2019-10-18 NOTE — Progress Notes (Signed)
[facility] [place], [place] [address]  Internal MEDICINE  Office Visit Note  Patient Name: Regina Deleon  [id number]  [id number]  Date of Service: 11/26/2019  Chief Complaint  Patient presents with   Hyperlipidemia   Hypertension    HPI  Pt is here for follow up on HTN, insomnia, and HLD.  Overall she is doing well, she is requesting some med refills at this time.  She currently controls her insomnia with ambien and she intermittenly uses tramadol for sciatica pain.     Current Medication: Outpatient Encounter Medications as of 10/18/2019  Medication Sig Note   acetaminophen (TYLENOL) 500 MG tablet Take 1,000 mg by mouth every 6 (six) hours as needed for mild pain.     aspirin 81 MG tablet Take 81 mg by mouth daily.     cetirizine (ZYRTEC) 10 MG tablet Take 10 mg by mouth daily as needed for allergies.    Cholecalciferol (VITAMIN D-3) 125 MCG (5000 UT) TABS Take 5,000 mg by mouth daily.    diclofenac Sodium (VOLTAREN) 1 % GEL APPLY 4 GRAMS TOPICALLY 4 (FOUR) TIMES DAILY. (Patient taking differently: Apply 2 g topically daily as needed (PAIN). )    docusate sodium (COLACE) 100 MG capsule Take 200 mg by mouth daily.    dorzolamide-timolol (COSOPT) 22.3-6.8 MG/ML ophthalmic solution Place 1 drop into both eyes 2 (two) times daily.    gabapentin (NEURONTIN) 100 MG capsule Take 1 capsule po qam and two capsules po QPM    ibuprofen (ADVIL) 800 MG tablet Take 1 tablet (800 mg total) by mouth 2 (two) times daily as needed.    latanoprost (XALATAN) 0.005 % ophthalmic solution Place 1 drop into both eyes at bedtime.    NIFEdipine (PROCARDIA-XL/NIFEDICAL-XL) 30 MG 24 hr tablet Take 1 tablet (30 mg total) by mouth 2 (two) times daily.    Omega 3 1000 MG CAPS Take 1 capsule (1,000 mg total) by mouth daily.    omega-3 acid ethyl esters (LOVAZA) 1 g capsule TAKE 1 CAPSULE (1,000 MG TOTAL) BY MOUTH DAILY.    omeprazole (PRILOSEC OTC) 20 MG tablet Take  20 mg by mouth daily as needed (acid reflux).    ondansetron (ZOFRAN) 4 MG tablet Take 1 tablet (4 mg total) by mouth every 8 (eight) hours as needed for nausea or vomiting.    rosuvastatin (CRESTOR) 5 MG tablet Take 1 tablet (5 mg total) by mouth 3 (three) times a week. 07/26/2019: Mon, Tue, Wed   tamsulosin (FLOMAX) 0.4 MG CAPS capsule Take 1 capsule (0.4 mg total) by mouth daily after supper.    traMADol (ULTRAM) 50 MG tablet Take 1 tablet (50 mg total) by mouth every 8 (eight) hours as needed for moderate pain.    triamterene-hydrochlorothiazide (MAXZIDE-25) 37.5-25 MG tablet Take 1 tablet by mouth daily.    zolpidem (AMBIEN) 10 MG tablet Take 1 tablet (10 mg total) by mouth at bedtime as needed for sleep.    [DISCONTINUED] traMADol (ULTRAM) 50 MG tablet Take 1 tablet (50 mg total) by mouth every 8 (eight) hours as needed for moderate pain.    [DISCONTINUED] zolpidem (AMBIEN) 10 MG tablet Take 1 tablet (10 mg total) by mouth at bedtime as needed for sleep.    No facility-administered encounter medications on file as of 10/18/2019.    Surgical History: Past Surgical History:  Procedure Laterality Date   ABDOMINAL HYSTERECTOMY  1986   AXILLARY LYMPH NODE DISSECTION Left 06/26/2014   Procedure: AXILLARY  LYMPH NODE DISSECTION;  Surgeon: Alphonsa Overall, MD;  Location: [place];  Service: General;  Laterality: Left;   [place]   lumbar   BREAST SURGERY Left 06/2014   CATARACT EXTRACTION W/PHACO Right 03/23/2019   Procedure: CATARACT EXTRACTION PHACO AND INTRAOCULAR LENS PLACEMENT ([place]) RIGHT VISION BLUE;  Surgeon: Marchia Meiers, MD;  Location: [facility];  Service: Ophthalmology;  Laterality: Right;  [place] 00:55.4 CDE 5.92 Fluid Pack lot # [id number]   COLONOSCOPY     COLONOSCOPY WITH PROPOFOL N/A 12/29/2017   Procedure: COLONOSCOPY WITH PROPOFOL;  Surgeon: Toledo, Benay Pike, MD;  Location: [facility];  Service: Gastroenterology;  Laterality: N/A;    CYSTOSCOPY/URETEROSCOPY/HOLMIUM LASER/STENT PLACEMENT Bilateral 07/28/2019   Procedure: CYSTOSCOPY/URETEROSCOPY/HOLMIUM LASER/STENT PLACEMENT;  Surgeon: Billey Co, MD;  Location: [facility];  Service: Urology;  Laterality: Bilateral;   DILATION AND CURETTAGE OF UTERUS     LAPAROSCOPIC GASTRIC BANDING  10/19/05   MASTECTOMY Left 2016   MASTECTOMY MODIFIED RADICAL Left 06/26/2014   Procedure: MASTECTOMY MODIFIED RADICAL;  Surgeon: Alphonsa Overall, MD;  Location: [place];  Service: General;  Laterality: Left;   PORT A CATH REVISION     insertion-10/15   PORT-A-CATH REMOVAL N/A 02/07/2015   Procedure: MINOR REMOVAL PORT-A-CATH;  Surgeon: Alphonsa Overall, MD;  Location: [place];  Service: General;  Laterality: N/A;    Medical History: Past Medical History:  Diagnosis Date   Cancer ([place])    lt br cancer   Edema leg    with chemo   GERD (gastroesophageal reflux disease)    also, diverticulosis   History of kidney stones    Hyperlipidemia    Hypertension    Neuropathy    hands and feet post chemo   Wears glasses     Family History: Family History  Problem Relation Age of Onset   Asthma Mother    Diabetes Mother    Diabetes Father    Heart disease Father    Cancer Maternal Aunt    Cancer Cousin    Cancer Cousin    Cancer Cousin     Social History   Socioeconomic History   Marital status: Married    Spouse name: Lake Bells   Number of children: Not on file   Years of education: Not on file   Highest education level: Not on file  Occupational History   Not on file  Tobacco Use   Smoking status: Never Smoker   Smokeless tobacco: Never Used  Vaping Use   Vaping Use: Never used  Substance and Sexual Activity   Alcohol use: No   Drug use: No   Sexual activity: Yes  Other Topics Concern   Not on file  Social History Narrative   Not on file   Social Determinants of Health   Financial Resource Strain:     Difficulty of Paying Living Expenses:   Food Insecurity:    Worried About Charity fundraiser in the Last Year:    Arboriculturist in the Last Year:   Transportation Needs:    Film/video editor (Medical):    Lack of Transportation (Non-Medical):   Physical Activity:    Days of Exercise per Week:    Minutes of Exercise per Session:   Stress:    Feeling of Stress :   Social Connections:    Frequency of Communication with Friends and Family:    Frequency of Social Gatherings with Friends and Family:  Attends Religious Services:    Active Member of Clubs or Organizations:    Attends Music therapist:    Marital Status:   Intimate Partner Violence:    Fear of Current or Ex-Partner:    Emotionally Abused:    Physically Abused:    Sexually Abused:       Review of Systems  Constitutional: Negative for chills, fatigue and unexpected weight change.  HENT: Negative for congestion, rhinorrhea, sneezing and sore throat.   Eyes: Negative for photophobia, pain and redness.  Respiratory: Negative for cough, chest tightness and shortness of breath.   Cardiovascular: Negative for chest pain and palpitations.  Gastrointestinal: Negative for abdominal pain, constipation, diarrhea, nausea and vomiting.  Endocrine: Negative.   Genitourinary: Negative for dysuria and frequency.  Musculoskeletal: Negative for arthralgias, back pain, joint swelling and neck pain.  Skin: Negative for rash.  Allergic/Immunologic: Negative.   Neurological: Negative for tremors and numbness.  Hematological: Negative for adenopathy. Does not bruise/bleed easily.  Psychiatric/Behavioral: Negative for behavioral problems and sleep disturbance. The patient is not nervous/anxious.     Vital Signs: BP 128/60    Pulse 83    Temp 97.9 F (36.6 C)    Resp 16    Ht 5\' 3"  (1.6 m)    Wt 201 lb (91.2 kg)    LMP 06/02/1984    SpO2 95%    BMI 35.61 kg/m    Physical Exam Vitals and  nursing note reviewed.  Constitutional:      General: She is not in acute distress.    Appearance: She is well-developed. She is not diaphoretic.  HENT:     Head: Normocephalic and atraumatic.     Mouth/Throat:     Pharynx: No oropharyngeal exudate.  Eyes:     Pupils: Pupils are equal, round, and reactive to light.  Neck:     Thyroid: No thyromegaly.     Vascular: No JVD.     Trachea: No tracheal deviation.  Cardiovascular:     Rate and Rhythm: Normal rate and regular rhythm.     Heart sounds: Normal heart sounds. No murmur heard.  No friction rub. No gallop.   Pulmonary:     Effort: Pulmonary effort is normal. No respiratory distress.     Breath sounds: Normal breath sounds. No wheezing or rales.  Chest:     Chest wall: No tenderness.  Abdominal:     Palpations: Abdomen is soft.     Tenderness: There is no abdominal tenderness. There is no guarding.  Musculoskeletal:        General: Normal range of motion.     Cervical back: Normal range of motion and neck supple.  Lymphadenopathy:     Cervical: No cervical adenopathy.  Skin:    General: Skin is warm and dry.  Neurological:     Mental Status: She is alert and oriented to person, place, and time.     Cranial Nerves: No cranial nerve deficit.  Psychiatric:        Behavior: Behavior normal.        Thought Content: Thought content normal.        Judgment: Judgment normal.    Assessment/Plan: 1. Primary insomnia Reviewed risks and possible side effects associated with taking opiates, benzodiazepines and other CNS depressants. Combination of these could cause dizziness and drowsiness. Advised patient not to drive or operate machinery when taking these medications, as patient's and other's life can be at risk and will have consequences. Patient  verbalized understanding in this matter. Dependence and abuse for these drugs will be monitored closely. A Controlled substance policy and procedure is on file which allows [facility] to order a urine drug screen test at any visit. Patient understands and agrees with the plan - zolpidem (AMBIEN) 10 MG tablet; Take 1 tablet (10 mg total) by mouth at bedtime as needed for sleep.  Dispense: 30 tablet; Refill: 2  2. Right sided sciatica Reviewed risks and possible side effects associated with taking opiates, benzodiazepines and other CNS depressants. Combination of these could cause dizziness and drowsiness. Advised patient not to drive or operate machinery when taking these medications, as patient's and other's life can be at risk and will have consequences. Patient verbalized understanding in this matter. Dependence and abuse for these drugs will be monitored closely. A Controlled substance policy and procedure is on file which allows [facility] to order a urine drug screen test at any visit. Patient understands and agrees with the plan - traMADol (ULTRAM) 50 MG tablet; Take 1 tablet (50 mg total) by mouth every 8 (eight) hours as needed for moderate pain.  Dispense: 45 tablet; Refill: 0  3. Thyroid nodule Thyroid US ordred  4. Renal stones Renal stones on CT 07/11/2019 continue to follow.   5. Essential hypertension Controlled, continue to follow.   6. Pulmonary nodules Pt will need repeat CT in future for nodule surveilance..   7. Gastric band slippage Pt will follow up with Ct  General Counseling: Rosita Fire understanding of the findings of todays visit and agrees with plan of treatment. I have discussed any further diagnostic evaluation that may be needed or ordered today. We also reviewed her medications today. she has been encouraged to call the office with any questions or concerns that should arise related to todays visit.    No orders of the defined types were placed in this encounter.   Meds ordered this encounter  Medications   zolpidem (AMBIEN) 10 MG tablet    Sig: Take 1 tablet (10 mg total) by mouth at bedtime as needed  for sleep.    Dispense:  30 tablet    Refill:  2   traMADol (ULTRAM) 50 MG tablet    Sig: Take 1 tablet (50 mg total) by mouth every 8 (eight) hours as needed for moderate pain.    Dispense:  45 tablet    Refill:  0    Time spent: 30 Minutes   This patient was seen by Orson Gear AGNP-C in Collaboration with Dr Lavera Guise as a part of collaborative care agreement     Kendell Bane AGNP-C Internal medicine

## 2019-10-31 DIAGNOSIS — R918 Other nonspecific abnormal finding of lung field: Secondary | ICD-10-CM | POA: Diagnosis not present

## 2019-11-07 ENCOUNTER — Other Ambulatory Visit: Payer: Self-pay

## 2019-11-07 ENCOUNTER — Ambulatory Visit (INDEPENDENT_AMBULATORY_CARE_PROVIDER_SITE_OTHER): Payer: PPO | Admitting: Nurse Practitioner

## 2019-11-07 ENCOUNTER — Encounter: Payer: Self-pay | Admitting: Nurse Practitioner

## 2019-11-07 VITALS — BP 140/78 | HR 85 | Temp 97.3°F | Resp 16 | Ht 63.0 in | Wt 198.6 lb

## 2019-11-07 DIAGNOSIS — M545 Low back pain, unspecified: Secondary | ICD-10-CM

## 2019-11-07 DIAGNOSIS — N390 Urinary tract infection, site not specified: Secondary | ICD-10-CM | POA: Diagnosis not present

## 2019-11-07 DIAGNOSIS — I10 Essential (primary) hypertension: Secondary | ICD-10-CM

## 2019-11-07 DIAGNOSIS — R300 Dysuria: Secondary | ICD-10-CM | POA: Diagnosis not present

## 2019-11-07 MED ORDER — CIPROFLOXACIN HCL 500 MG PO TABS: 500.0000 mg | ORAL_TABLET | Freq: Two times a day (BID) | ORAL | 0 refills | Status: DC

## 2019-11-07 NOTE — Progress Notes (Signed)
[facility] [place], [place] [address]  Internal MEDICINE  Office Visit Note  Patient Name: Regina Deleon  [id number]  [id number]  Date of Service: 11/12/2019  Chief Complaint  Patient presents with  . Abdominal Pain    Left side pain; took OTC medication, helped a little     The patient is here for acute visit. Today, she states that she has been having some left lower quadrant abdominal pain for the left week. She started taking some OTC AZO which did help for a short period of time. Then, she states that on Friday, she started having episodes of diarrhea. She states that pain is there nearly all the time. She states that pain would get a bit worse prior to episodes of diarrhea. She has taken some pepto bismol for this and it did help. She states that she was able to take some tramadol for the pain last night and this also helped the pain. She states that she took both the Azo and pepto bismol yesterday. She states that diarrhea stopped, but left lower quadrant pain continues. She denies fever, nausea, or vomiting.   Pt is here for a sick visit.     Current Medication:  Outpatient Encounter Medications as of 11/07/2019  Medication Sig Note  . acetaminophen (TYLENOL) 500 MG tablet Take 1,000 mg by mouth every 6 (six) hours as needed for mild pain.    Marland Kitchen aspirin 81 MG tablet Take 81 mg by mouth daily.    . cetirizine (ZYRTEC) 10 MG tablet Take 10 mg by mouth daily as needed for allergies.   . Cholecalciferol (VITAMIN D-3) 125 MCG (5000 UT) TABS Take 5,000 mg by mouth daily.   . diclofenac Sodium (VOLTAREN) 1 % GEL APPLY 4 GRAMS TOPICALLY 4 (FOUR) TIMES DAILY. (Patient taking differently: Apply 2 g topically daily as needed (PAIN). )   . docusate sodium (COLACE) 100 MG capsule Take 200 mg by mouth daily.   . dorzolamide-timolol (COSOPT) 22.3-6.8 MG/ML ophthalmic solution Place 1 drop into both eyes 2 (two) times daily.   Marland Kitchen gabapentin (NEURONTIN) 100 MG  capsule Take 1 capsule po qam and two capsules po QPM   . ibuprofen (ADVIL) 800 MG tablet Take 1 tablet (800 mg total) by mouth 2 (two) times daily as needed.   . latanoprost (XALATAN) 0.005 % ophthalmic solution Place 1 drop into both eyes at bedtime.   Marland Kitchen NIFEdipine (PROCARDIA-XL/NIFEDICAL-XL) 30 MG 24 hr tablet Take 1 tablet (30 mg total) by mouth 2 (two) times daily.   . Omega 3 1000 MG CAPS Take 1 capsule (1,000 mg total) by mouth daily.   Marland Kitchen omega-3 acid ethyl esters (LOVAZA) 1 g capsule TAKE 1 CAPSULE (1,000 MG TOTAL) BY MOUTH DAILY.   Marland Kitchen omeprazole (PRILOSEC OTC) 20 MG tablet Take 20 mg by mouth daily as needed (acid reflux).   . ondansetron (ZOFRAN) 4 MG tablet Take 1 tablet (4 mg total) by mouth every 8 (eight) hours as needed for nausea or vomiting.   . rosuvastatin (CRESTOR) 5 MG tablet Take 1 tablet (5 mg total) by mouth 3 (three) times a week. 07/26/2019: Mon, Tue, Wed  . tamsulosin (FLOMAX) 0.4 MG CAPS capsule Take 1 capsule (0.4 mg total) by mouth daily after supper.   . traMADol (ULTRAM) 50 MG tablet Take 1 tablet (50 mg total) by mouth every 8 (eight) hours as needed for moderate pain.   Marland Kitchen triamterene-hydrochlorothiazide (MAXZIDE-25) 37.5-25 MG tablet Take 1 tablet by  mouth daily.   Marland Kitchen zolpidem (AMBIEN) 10 MG tablet Take 1 tablet (10 mg total) by mouth at bedtime as needed for sleep.   . ciprofloxacin (CIPRO) 500 MG tablet Take 1 tablet (500 mg total) by mouth 2 (two) times daily.    No facility-administered encounter medications on file as of 11/07/2019.      Medical History: Past Medical History:  Diagnosis Date  . Cancer ([place])    lt br cancer  . Edema leg    with chemo  . GERD (gastroesophageal reflux disease)    also, diverticulosis  . History of kidney stones   . Hyperlipidemia   . Hypertension   . Neuropathy    hands and feet post chemo  . Wears glasses      Today's Vitals   11/07/19 1100  BP: 140/78  Pulse: 85  Resp: 16  Temp: (!) 97.3 F (36.3 C)  SpO2:  97%  Weight: 198 lb 9.6 oz (90.1 kg)  Height: 5\' 3"  (1.6 m)   Body mass index is 35.18 kg/m.  Review of Systems  Constitutional: Positive for fatigue. Negative for activity change, chills, fever and unexpected weight change.  HENT: Negative for congestion, postnasal drip, rhinorrhea, sneezing and sore throat.   Respiratory: Negative for cough, chest tightness, shortness of breath and wheezing.   Cardiovascular: Negative for chest pain and palpitations.  Gastrointestinal: Positive for abdominal pain. Negative for constipation, diarrhea, nausea and vomiting.  Endocrine: Negative for cold intolerance, heat intolerance, polydipsia and polyuria.  Genitourinary: Positive for dysuria, flank pain, frequency, pelvic pain and urgency. Negative for hematuria.  Musculoskeletal: Positive for back pain. Negative for arthralgias, joint swelling and neck pain.       Back pain radiating into the right hip and leg.   Skin: Negative for rash.  Allergic/Immunologic: Negative for environmental allergies.  Neurological: Negative for dizziness, tremors, numbness and headaches.  Hematological: Negative for adenopathy. Does not bruise/bleed easily.  Psychiatric/Behavioral: Positive for sleep disturbance. Negative for behavioral problems (Depression) and suicidal ideas. The patient is not nervous/anxious.     Physical Exam Vitals and nursing note reviewed.  Constitutional:      General: She is not in acute distress.    Appearance: She is well-developed. She is ill-appearing. She is not diaphoretic.  HENT:     Head: Normocephalic and atraumatic.     Nose: Nose normal.     Mouth/Throat:     Pharynx: No oropharyngeal exudate.  Eyes:     Pupils: Pupils are equal, round, and reactive to light.  Neck:     Thyroid: No thyromegaly.     Vascular: No JVD.     Trachea: No tracheal deviation.  Cardiovascular:     Rate and Rhythm: Normal rate and regular rhythm.     Heart sounds: Normal heart sounds. No murmur  heard.  No friction rub. No gallop.   Pulmonary:     Effort: Pulmonary effort is normal. No respiratory distress.     Breath sounds: Normal breath sounds. No wheezing or rales.  Chest:     Chest wall: No tenderness.  Abdominal:     General: Bowel sounds are normal.     Palpations: Abdomen is soft.     Tenderness: There is abdominal tenderness.     Comments: There is tenderness with palpation along the lower and pelvic regions of the abdomen.   Genitourinary:    Comments: Bilateral flank pain present. Urine sample sent to lab for analysis as she has been  taking AZO to help with bladder pain and spasms.  Musculoskeletal:        General: Normal range of motion.     Cervical back: Normal range of motion and neck supple.  Lymphadenopathy:     Cervical: No cervical adenopathy.  Skin:    General: Skin is warm and dry.  Neurological:     Mental Status: She is alert and oriented to person, place, and time.     Cranial Nerves: No cranial nerve deficit.  Psychiatric:        Mood and Affect: Mood normal.        Behavior: Behavior normal.        Thought Content: Thought content normal.        Judgment: Judgment normal.   Assessment/Plan: 1. Urinary tract infection without hematuria, site unspecified Start cipro 500mg  twice daily for 10 days. Urine sample sent to lab for analysis, culture, and sensitivity. Adjust antibiotic treatment as indicated.  - ciprofloxacin (CIPRO) 500 MG tablet; Take 1 tablet (500 mg total) by mouth 2 (two) times daily.  Dispense: 20 tablet; Refill: 0 - CULTURE, URINE COMPREHENSIVE  2. Low back pain, unspecified back pain laterality, unspecified chronicity, unspecified whether sciatica present Likely associated with UTI. Suggest she take tylenol/ibuprofen for pain and inflammation as needed and as indicated.   3. Dysuria - Urinalysis, Routine w reflex microscopic  4. Essential hypertension Stable. Continue bp medication as prescribed   General Counseling:  brandyn thien understanding of the findings of todays visit and agrees with plan of treatment. I have discussed any further diagnostic evaluation that may be needed or ordered today. We also reviewed her medications today. she has been encouraged to call the office with any questions or concerns that should arise related to todays visit.    Counseling:  This patient was seen by Leretha Pol FNP Collaboration with Dr Lavera Guise as a part of collaborative care agreement  Orders Placed This Encounter  Procedures  . CULTURE, URINE COMPREHENSIVE  . Urinalysis, Routine w reflex microscopic    Meds ordered this encounter  Medications  . ciprofloxacin (CIPRO) 500 MG tablet    Sig: Take 1 tablet (500 mg total) by mouth 2 (two) times daily.    Dispense:  20 tablet    Refill:  0    Order Specific Question:   Supervising Provider    Answer:   Lavera Guise [[id number]]    Time spent: 30 Minutes

## 2019-11-08 ENCOUNTER — Telehealth: Payer: Self-pay

## 2019-11-08 NOTE — Telephone Encounter (Signed)
Confirmed patient ultrasound appt 

## 2019-11-09 LAB — CULTURE, URINE COMPREHENSIVE

## 2019-11-09 NOTE — Progress Notes (Signed)
Patient started on cipro at time of visit.

## 2019-11-10 ENCOUNTER — Ambulatory Visit: Payer: PPO

## 2019-11-10 ENCOUNTER — Other Ambulatory Visit: Payer: Self-pay

## 2019-11-10 DIAGNOSIS — E041 Nontoxic single thyroid nodule: Secondary | ICD-10-CM

## 2019-11-15 DIAGNOSIS — H02834 Dermatochalasis of left upper eyelid: Secondary | ICD-10-CM | POA: Diagnosis not present

## 2019-11-15 DIAGNOSIS — H02403 Unspecified ptosis of bilateral eyelids: Secondary | ICD-10-CM | POA: Diagnosis not present

## 2019-11-15 DIAGNOSIS — Z7982 Long term (current) use of aspirin: Secondary | ICD-10-CM | POA: Diagnosis not present

## 2019-11-15 DIAGNOSIS — Z79899 Other long term (current) drug therapy: Secondary | ICD-10-CM | POA: Diagnosis not present

## 2019-11-15 DIAGNOSIS — Z853 Personal history of malignant neoplasm of breast: Secondary | ICD-10-CM | POA: Diagnosis not present

## 2019-11-15 DIAGNOSIS — Z791 Long term (current) use of non-steroidal anti-inflammatories (NSAID): Secondary | ICD-10-CM | POA: Diagnosis not present

## 2019-11-15 DIAGNOSIS — H02832 Dermatochalasis of right lower eyelid: Secondary | ICD-10-CM | POA: Diagnosis not present

## 2019-11-15 DIAGNOSIS — H02835 Dermatochalasis of left lower eyelid: Secondary | ICD-10-CM | POA: Diagnosis not present

## 2019-11-15 DIAGNOSIS — I10 Essential (primary) hypertension: Secondary | ICD-10-CM | POA: Diagnosis not present

## 2019-11-15 DIAGNOSIS — H02831 Dermatochalasis of right upper eyelid: Secondary | ICD-10-CM | POA: Diagnosis not present

## 2019-11-17 ENCOUNTER — Other Ambulatory Visit: Payer: Self-pay | Admitting: Nurse Practitioner

## 2019-11-17 ENCOUNTER — Telehealth: Payer: Self-pay

## 2019-11-17 DIAGNOSIS — B3731 Acute candidiasis of vulva and vagina: Secondary | ICD-10-CM

## 2019-11-17 MED ORDER — FLUCONAZOLE 150 MG PO TABS: ORAL_TABLET | ORAL | 0 refills | Status: DC

## 2019-11-17 NOTE — Progress Notes (Signed)
Yeast infection developing after treatment with cipro for uti. Start diflucan 150mg  tablet. Take one tablet one time. May repeat dose in three days for persistent symptoms. Prescription for #3 tablets was sent to her pharmacy.

## 2019-11-17 NOTE — Telephone Encounter (Signed)
Please let the patient know that she likely has Yeast infection developing after treatment with cipro for uti. Start diflucan 150mg  tablet. Take one tablet one time. May repeat dose in three days for persistent symptoms. Prescription for #3 tablets was sent to her pharmacy. Thanks.

## 2019-11-27 NOTE — Progress Notes (Signed)
I don't see prior ultrasound results from April, unless they were done in another facility. I can't access care everywhere in her note unless it's open. I will talk to her about FNA and referral when I see her 11/28/2019

## 2019-11-28 ENCOUNTER — Encounter: Payer: Self-pay | Admitting: Nurse Practitioner

## 2019-11-28 ENCOUNTER — Ambulatory Visit (INDEPENDENT_AMBULATORY_CARE_PROVIDER_SITE_OTHER): Payer: PPO | Admitting: Nurse Practitioner

## 2019-11-28 VITALS — Resp 16 | Ht 63.0 in | Wt 197.0 lb

## 2019-11-28 DIAGNOSIS — E042 Nontoxic multinodular goiter: Secondary | ICD-10-CM

## 2019-11-28 NOTE — Progress Notes (Signed)
[facility] [place], [place] [address]  Internal MEDICINE  Telephone Visit  Patient Name: Regina Deleon  [id number]  [id number]  Date of Service: 12/24/2019  I connected with the patient at 5:38pm by telephone and verified the patients identity using two identifiers.   I discussed the limitations, risks, security and privacy concerns of performing an evaluation and management service by telephone and the availability of in person appointments. I also discussed with the patient that there may be a patient responsible charge related to the service.  The patient expressed understanding and agrees to proceed.    Chief Complaint  Patient presents with  . Telephone Screen  . Telephone Assessment  . Follow-up    Review [place] results    The patient has been contacted via telephone for follow up visit due to concerns for spread of novel coronavirus. She recently had thyroid ultrasound. This was done after chest CT showed 1.7 cm left thyroid nodule. The thyroid ultrasound is showing multinodular goiter. Multiple nodules both lobes of thyroid. There are three distinct, subcentimeter nodules on right lobe of thyroid. Left lobe has nodule measuring 2.5cm in diameter. Needs to have further evaluation and FNA biopsy.       Current Medication: Outpatient Encounter Medications as of 11/28/2019  Medication Sig Note  . acetaminophen (TYLENOL) 500 MG tablet Take 1,000 mg by mouth every 6 (six) hours as needed for mild pain.    Marland Kitchen aspirin 81 MG tablet Take 81 mg by mouth daily.    . cetirizine (ZYRTEC) 10 MG tablet Take 10 mg by mouth daily as needed for allergies.   . Cholecalciferol (VITAMIN D-3) 125 MCG (5000 UT) TABS Take 5,000 mg by mouth daily.   . diclofenac Sodium (VOLTAREN) 1 % GEL APPLY 4 GRAMS TOPICALLY 4 (FOUR) TIMES DAILY. (Patient taking differently: Apply 2 g topically daily as needed (PAIN). )   . docusate sodium (COLACE) 100 MG capsule Take 200 mg by mouth daily.    . dorzolamide-timolol (COSOPT) 22.3-6.8 MG/ML ophthalmic solution Place 1 drop into both eyes 2 (two) times daily.   . fluconazole (DIFLUCAN) 150 MG tablet Take 1 tablet po once. May repeat dose in 3 days as needed for persistent symptoms.   Marland Kitchen gabapentin (NEURONTIN) 100 MG capsule Take 1 capsule po qam and two capsules po QPM   . ibuprofen (ADVIL) 800 MG tablet Take 1 tablet (800 mg total) by mouth 2 (two) times daily as needed.   . latanoprost (XALATAN) 0.005 % ophthalmic solution Place 1 drop into both eyes at bedtime.   Marland Kitchen NIFEdipine (PROCARDIA-XL/NIFEDICAL-XL) 30 MG 24 hr tablet Take 1 tablet (30 mg total) by mouth 2 (two) times daily.   . Omega 3 1000 MG CAPS Take 1 capsule (1,000 mg total) by mouth daily.   Marland Kitchen omega-3 acid ethyl esters (LOVAZA) 1 g capsule TAKE 1 CAPSULE (1,000 MG TOTAL) BY MOUTH DAILY.   Marland Kitchen omeprazole (PRILOSEC OTC) 20 MG tablet Take 20 mg by mouth daily as needed (acid reflux).   . ondansetron (ZOFRAN) 4 MG tablet Take 1 tablet (4 mg total) by mouth every 8 (eight) hours as needed for nausea or vomiting.   . tamsulosin (FLOMAX) 0.4 MG CAPS capsule Take 1 capsule (0.4 mg total) by mouth daily after supper.   . traMADol (ULTRAM) 50 MG tablet Take 1 tablet (50 mg total) by mouth every 8 (eight) hours as needed for moderate pain.   Marland Kitchen triamterene-hydrochlorothiazide (MAXZIDE-25) 37.5-25 MG tablet Take  1 tablet by mouth daily.   Marland Kitchen zolpidem (AMBIEN) 10 MG tablet Take 1 tablet (10 mg total) by mouth at bedtime as needed for sleep.   . [DISCONTINUED] ciprofloxacin (CIPRO) 500 MG tablet Take 1 tablet (500 mg total) by mouth 2 (two) times daily.   . [DISCONTINUED] rosuvastatin (CRESTOR) 5 MG tablet Take 1 tablet (5 mg total) by mouth 3 (three) times a week. 07/26/2019: Mon, Tue, Wed   No facility-administered encounter medications on file as of 11/28/2019.    Surgical History: Past Surgical History:  Procedure Laterality Date  . ABDOMINAL HYSTERECTOMY  1986  . AXILLARY LYMPH NODE  DISSECTION Left 06/26/2014   Procedure: AXILLARY LYMPH NODE DISSECTION;  Surgeon: Alphonsa Overall, MD;  Location: [place] [facility];  Service: General;  Laterality: Left;  . [place]   lumbar  . BREAST SURGERY Left 06/2014  . CATARACT EXTRACTION W/PHACO Right 03/23/2019   Procedure: CATARACT EXTRACTION PHACO AND INTRAOCULAR LENS PLACEMENT ([place]) RIGHT VISION BLUE;  Surgeon: Marchia Meiers, MD;  Location: [facility];  Service: Ophthalmology;  Laterality: Right;  [place] 00:55.4 CDE 5.92 Fluid Pack lot # [id number]  . COLONOSCOPY    . COLONOSCOPY WITH PROPOFOL N/A 12/29/2017   Procedure: COLONOSCOPY WITH PROPOFOL;  Surgeon: Toledo, Benay Pike, MD;  Location: [facility];  Service: Gastroenterology;  Laterality: N/A;  . CYSTOSCOPY/URETEROSCOPY/HOLMIUM LASER/STENT PLACEMENT Bilateral 07/28/2019   Procedure: CYSTOSCOPY/URETEROSCOPY/HOLMIUM LASER/STENT PLACEMENT;  Surgeon: Billey Co, MD;  Location: [facility];  Service: Urology;  Laterality: Bilateral;  . DILATION AND CURETTAGE OF UTERUS    . LAPAROSCOPIC GASTRIC BANDING  10/19/05  . MASTECTOMY Left 2016  . MASTECTOMY MODIFIED RADICAL Left 06/26/2014   Procedure: MASTECTOMY MODIFIED RADICAL;  Surgeon: Alphonsa Overall, MD;  Location: [place];  Service: General;  Laterality: Left;  . PORT A CATH REVISION     insertion-10/15  . PORT-A-CATH REMOVAL N/A 02/07/2015   Procedure: MINOR REMOVAL PORT-A-CATH;  Surgeon: Alphonsa Overall, MD;  Location: [place];  Service: General;  Laterality: N/A;    Medical History: Past Medical History:  Diagnosis Date  . Cancer ([place])    lt br cancer  . Edema leg    with chemo  . GERD (gastroesophageal reflux disease)    also, diverticulosis  . History of kidney stones   . Hyperlipidemia   . Hypertension   . Neuropathy    hands and feet post chemo  . Wears glasses     Family History: Family History  Problem Relation Age of Onset  . Asthma Mother   . Diabetes  Mother   . Diabetes Father   . Heart disease Father   . Cancer Maternal Aunt   . Cancer Cousin   . Cancer Cousin   . Cancer Cousin     Social History   Socioeconomic History  . Marital status: Married    Spouse name: [place] Bells  . Number of children: Not on file  . Years of education: Not on file  . Highest education level: Not on file  Occupational History  . Not on file  Tobacco Use  . Smoking status: Never Smoker  . Smokeless tobacco: Never Used  Vaping Use  . Vaping Use: Never used  Substance and Sexual Activity  . Alcohol use: No  . Drug use: No  . Sexual activity: Yes  Other Topics Concern  . Not on file  Social History Narrative  . Not on file   Social Determinants of Health  Financial Resource Strain:   . Difficulty of Paying Living Expenses: Not on file  Food Insecurity:   . Worried About Charity fundraiser in the Last Year: Not on file  . Ran Out of Food in the Last Year: Not on file  Transportation Needs:   . Lack of Transportation (Medical): Not on file  . Lack of Transportation (Non-Medical): Not on file  Physical Activity:   . Days of Exercise per Week: Not on file  . Minutes of Exercise per Session: Not on file  Stress:   . Feeling of Stress : Not on file  Social Connections:   . Frequency of Communication with Friends and Family: Not on file  . Frequency of Social Gatherings with Friends and Family: Not on file  . Attends Religious Services: Not on file  . Active Member of Clubs or Organizations: Not on file  . Attends Archivist Meetings: Not on file  . Marital Status: Not on file  Intimate Partner Violence:   . Fear of Current or Ex-Partner: Not on file  . Emotionally Abused: Not on file  . Physically Abused: Not on file  . Sexually Abused: Not on file      Review of Systems  Constitutional: Negative for activity change, chills, fatigue and unexpected weight change.  HENT: Negative for congestion, postnasal drip, rhinorrhea,  sneezing and sore throat.   Respiratory: Negative for cough, chest tightness, shortness of breath and wheezing.   Cardiovascular: Negative for chest pain and palpitations.  Gastrointestinal: Negative for abdominal pain, constipation, diarrhea, nausea and vomiting.  Endocrine: Negative for cold intolerance, heat intolerance, polydipsia and polyuria.       Enlarged thyroid  Musculoskeletal: Negative for arthralgias, back pain, joint swelling and neck pain.  Skin: Negative for rash.  Allergic/Immunologic: Negative for environmental allergies.  Neurological: Negative for dizziness, tremors, numbness and headaches.  Hematological: Negative for adenopathy. Does not bruise/bleed easily.  Psychiatric/Behavioral: Negative for behavioral problems (Depression), sleep disturbance and suicidal ideas. The patient is not nervous/anxious.     Today's Vitals   11/28/19 1618  Resp: 16  Weight: 197 lb (89.4 kg)  Height: 5\' 3"  (1.6 m)   Body mass index is 34.9 kg/m.  Observation/Objective:   The patient is alert and oriented. She is pleasant and answers all questions appropriately. Breathing is non-labored. She is in no acute distress at this time.    Assessment/Plan: 1. Multinodular goiter Reviewed results of thyroid ultrasound with the patient. Ultrasound showing multinodular goiter. Multiple nodules both lobes of thyroid. There are three distinct, subcentimeter nodules on right lobe of thyroid. Left lobe has nodule measuring 2.5cm in diameter. Needs to have further evaluation and FNA biopsy. Refer to endocrinology. - Ambulatory referral to Endocrinology  General Counseling: talitha dicarlo understanding of the findings of today's phone visit and agrees with plan of treatment. I have discussed any further diagnostic evaluation that may be needed or ordered today. We also reviewed her medications today. she has been encouraged to call the office with any questions or concerns that should arise  related to todays visit.  This patient was seen by Leretha Pol FNP Collaboration with Dr Lavera Guise as a part of collaborative care agreement  Orders Placed This Encounter  Procedures  . Ambulatory referral to Endocrinology      Time spent: 25 Minutes    Dr Lavera Guise Internal medicine

## 2019-11-30 ENCOUNTER — Ambulatory Visit: Payer: PPO | Admitting: Nurse Practitioner

## 2019-12-15 ENCOUNTER — Other Ambulatory Visit: Payer: Self-pay | Admitting: Adult Health

## 2019-12-15 DIAGNOSIS — E785 Hyperlipidemia, unspecified: Secondary | ICD-10-CM

## 2019-12-22 ENCOUNTER — Telehealth: Payer: Self-pay

## 2019-12-22 NOTE — Telephone Encounter (Signed)
Confirmed and screened for 12-26-19 ov.

## 2019-12-24 DIAGNOSIS — E042 Nontoxic multinodular goiter: Secondary | ICD-10-CM | POA: Insufficient documentation

## 2019-12-26 ENCOUNTER — Encounter: Payer: Self-pay | Admitting: Nurse Practitioner

## 2019-12-26 ENCOUNTER — Other Ambulatory Visit: Payer: Self-pay

## 2019-12-26 ENCOUNTER — Ambulatory Visit (INDEPENDENT_AMBULATORY_CARE_PROVIDER_SITE_OTHER): Payer: PPO | Admitting: Nurse Practitioner

## 2019-12-26 VITALS — BP 109/80 | HR 92 | Temp 97.5°F | Resp 16 | Ht 63.0 in | Wt 197.4 lb

## 2019-12-26 DIAGNOSIS — R300 Dysuria: Secondary | ICD-10-CM

## 2019-12-26 DIAGNOSIS — K1230 Oral mucositis (ulcerative), unspecified: Secondary | ICD-10-CM | POA: Diagnosis not present

## 2019-12-26 DIAGNOSIS — K121 Other forms of stomatitis: Secondary | ICD-10-CM

## 2019-12-26 DIAGNOSIS — F5101 Primary insomnia: Secondary | ICD-10-CM | POA: Diagnosis not present

## 2019-12-26 DIAGNOSIS — E042 Nontoxic multinodular goiter: Secondary | ICD-10-CM | POA: Diagnosis not present

## 2019-12-26 DIAGNOSIS — Z853 Personal history of malignant neoplasm of breast: Secondary | ICD-10-CM | POA: Diagnosis not present

## 2019-12-26 DIAGNOSIS — I10 Essential (primary) hypertension: Secondary | ICD-10-CM

## 2019-12-26 DIAGNOSIS — Z0001 Encounter for general adult medical examination with abnormal findings: Secondary | ICD-10-CM

## 2019-12-26 MED ORDER — ZOLPIDEM TARTRATE 10 MG PO TABS: 10.0000 mg | ORAL_TABLET | Freq: Every evening | ORAL | 3 refills | Status: DC | PRN

## 2019-12-26 NOTE — Addendum Note (Signed)
Addended by: Leretha Pol on: 12/26/2019 10:13 AM   Modules accepted: Orders, Level of Service

## 2019-12-26 NOTE — Progress Notes (Signed)
[facility] [place], [place] [address]  Internal MEDICINE  Office Visit Note  Patient Name: Regina Deleon  [id number]  [id number]  Date of Service: 12/26/2019   Pt is here for routine health maintenance examination   Chief Complaint  Patient presents with  . Medicare Wellness    gums hurting  . Hyperlipidemia  . Hypertension  . Gastroesophageal Reflux  . Quality Metric Gaps    T-Dap, hep C  . Medication Refill    Lorrin Mais     The patient is here for health maintenance exam. She is reporting some soreness and pain of her gums. This has bothered her for some time. Is afraid to go to the dentist for this, as she is afraid they will just stick needles into her gums. She does gargle with peroxide and Listerine. Uses Sensodyne toothpaste, but those only help for short period of time. She states that this has been problem since she had her chemotherapy after treatment for breast cancer. She was referred to endocrinology after her last visit due to enlarged nodule on thyroid gland. She states that she has not yet been contacted for this consultation. She is due to have routine, fasting labs. She is scheduled for screening breast cancer in 02/2020.  She states that she needs to have refills for her [place] today.   Current Medication: Outpatient Encounter Medications as of 12/26/2019  Medication Sig  . acetaminophen (TYLENOL) 500 MG tablet Take 1,000 mg by mouth every 6 (six) hours as needed for mild pain.   Marland Kitchen aspirin 81 MG tablet Take 81 mg by mouth daily.   . cetirizine (ZYRTEC) 10 MG tablet Take 10 mg by mouth daily as needed for allergies.  . Cholecalciferol (VITAMIN D-3) 125 MCG (5000 UT) TABS Take 5,000 mg by mouth daily.  . diclofenac Sodium (VOLTAREN) 1 % GEL APPLY 4 GRAMS TOPICALLY 4 (FOUR) TIMES DAILY. (Patient taking differently: Apply 2 g topically daily as needed (PAIN). )  . docusate sodium (COLACE) 100 MG capsule Take 200 mg by mouth daily.  .  dorzolamide-timolol (COSOPT) 22.3-6.8 MG/ML ophthalmic solution Place 1 drop into both eyes 2 (two) times daily.  . fluconazole (DIFLUCAN) 150 MG tablet Take 1 tablet po once. May repeat dose in 3 days as needed for persistent symptoms.  Marland Kitchen gabapentin (NEURONTIN) 100 MG capsule Take 1 capsule po qam and two capsules po QPM  . ibuprofen (ADVIL) 800 MG tablet Take 1 tablet (800 mg total) by mouth 2 (two) times daily as needed.  . latanoprost (XALATAN) 0.005 % ophthalmic solution Place 1 drop into both eyes at bedtime.  Marland Kitchen NIFEdipine (PROCARDIA-XL/NIFEDICAL-XL) 30 MG 24 hr tablet Take 1 tablet (30 mg total) by mouth 2 (two) times daily.  . Omega 3 1000 MG CAPS Take 1 capsule (1,000 mg total) by mouth daily.  Marland Kitchen omega-3 acid ethyl esters (LOVAZA) 1 g capsule TAKE 1 CAPSULE (1,000 MG TOTAL) BY MOUTH DAILY.  Marland Kitchen omeprazole (PRILOSEC OTC) 20 MG tablet Take 20 mg by mouth daily as needed (acid reflux).  . ondansetron (ZOFRAN) 4 MG tablet Take 1 tablet (4 mg total) by mouth every 8 (eight) hours as needed for nausea or vomiting.  . rosuvastatin (CRESTOR) 5 MG tablet TAKE 1 TABLET (5 MG TOTAL) BY MOUTH 3 (THREE) TIMES A WEEK.  . tamsulosin (FLOMAX) 0.4 MG CAPS capsule Take 1 capsule (0.4 mg total) by mouth daily after supper.  . traMADol (ULTRAM) 50 MG tablet Take 1 tablet (50  mg total) by mouth every 8 (eight) hours as needed for moderate pain.  Marland Kitchen triamterene-hydrochlorothiazide (MAXZIDE-25) 37.5-25 MG tablet Take 1 tablet by mouth daily.  Marland Kitchen zolpidem (AMBIEN) 10 MG tablet Take 1 tablet (10 mg total) by mouth at bedtime as needed for sleep.  . [DISCONTINUED] zolpidem (AMBIEN) 10 MG tablet Take 1 tablet (10 mg total) by mouth at bedtime as needed for sleep.   No facility-administered encounter medications on file as of 12/26/2019.    Surgical History: Past Surgical History:  Procedure Laterality Date  . ABDOMINAL HYSTERECTOMY  1986  . AXILLARY LYMPH NODE DISSECTION Left 06/26/2014   Procedure: AXILLARY LYMPH  NODE DISSECTION;  Surgeon: Alphonsa Overall, MD;  Location: [place];  Service: General;  Laterality: Left;  . [place]   lumbar  . BREAST SURGERY Left 06/2014  . CATARACT EXTRACTION W/PHACO Right 03/23/2019   Procedure: CATARACT EXTRACTION PHACO AND INTRAOCULAR LENS PLACEMENT ([place]) RIGHT VISION BLUE;  Surgeon: Marchia Meiers, MD;  Location: [facility];  Service: Ophthalmology;  Laterality: Right;  [place] 00:55.4 CDE 5.92 Fluid Pack lot # [id number]  . COLONOSCOPY    . COLONOSCOPY WITH PROPOFOL N/A 12/29/2017   Procedure: COLONOSCOPY WITH PROPOFOL;  Surgeon: Toledo, Benay Pike, MD;  Location: [facility];  Service: Gastroenterology;  Laterality: N/A;  . CYSTOSCOPY/URETEROSCOPY/HOLMIUM LASER/STENT PLACEMENT Bilateral 07/28/2019   Procedure: CYSTOSCOPY/URETEROSCOPY/HOLMIUM LASER/STENT PLACEMENT;  Surgeon: Billey Co, MD;  Location: [facility];  Service: Urology;  Laterality: Bilateral;  . DILATION AND CURETTAGE OF UTERUS    . LAPAROSCOPIC GASTRIC BANDING  10/19/05  . MASTECTOMY Left 2016  . MASTECTOMY MODIFIED RADICAL Left 06/26/2014   Procedure: MASTECTOMY MODIFIED RADICAL;  Surgeon: Alphonsa Overall, MD;  Location: [facility];  Service: General;  Laterality: Left;  . PORT A CATH REVISION     insertion-10/15  . PORT-A-CATH REMOVAL N/A 02/07/2015   Procedure: MINOR REMOVAL PORT-A-CATH;  Surgeon: Alphonsa Overall, MD;  Location: [facility];  Service: General;  Laterality: N/A;    Medical History: Past Medical History:  Diagnosis Date  . Cancer ([place])    lt br cancer  . Edema leg    with chemo  . GERD (gastroesophageal reflux disease)    also, diverticulosis  . History of kidney stones   . Hyperlipidemia   . Hypertension   . Neuropathy    hands and feet post chemo  . Wears glasses     Family History: Family History  Problem Relation Age of Onset  . Asthma Mother   . Diabetes Mother   . Diabetes Father   . Heart disease Father   .  Cancer Maternal Aunt   . Cancer Cousin   . Cancer Cousin   . Cancer Cousin       Review of Systems  Constitutional: Negative for activity change, chills, fatigue and unexpected weight change.  HENT: Positive for dental problem. Negative for congestion, postnasal drip, rhinorrhea, sneezing and sore throat.        Sore and irritated gums .  Respiratory: Negative for cough, chest tightness, shortness of breath and wheezing.   Cardiovascular: Negative for chest pain and palpitations.  Gastrointestinal: Negative for abdominal pain, constipation, diarrhea, nausea and vomiting.  Endocrine: Negative for cold intolerance, heat intolerance, polydipsia and polyuria.       Enlarged thyroid  Genitourinary: Negative for dysuria, frequency and urgency.  Musculoskeletal: Negative for arthralgias, back pain, joint swelling and neck pain.  Skin: Negative for rash.  Allergic/Immunologic: Negative for environmental allergies.  Neurological: Negative for dizziness, tremors, numbness and headaches.  Hematological: Negative for adenopathy. Does not bruise/bleed easily.  Psychiatric/Behavioral: Negative for behavioral problems (Depression), sleep disturbance and suicidal ideas. The patient is not nervous/anxious.     Today's Vitals   12/26/19 0907  BP: 109/80  Pulse: 92  Resp: 16  Temp: (!) 97.5 F (36.4 C)  SpO2: 97%  Weight: 197 lb 6.4 oz (89.5 kg)  Height: 5\' 3"  (1.6 m)   Body mass index is 34.97 kg/m.  Physical Exam Vitals and nursing note reviewed.  Constitutional:      General: She is not in acute distress.    Appearance: Normal appearance. She is well-developed. She is obese. She is not ill-appearing or diaphoretic.  HENT:     Head: Normocephalic and atraumatic.     Nose: Nose normal.     Mouth/Throat:     Pharynx: No oropharyngeal exudate.  Eyes:     Pupils: Pupils are equal, round, and reactive to light.  Neck:     Thyroid: No thyromegaly.     Vascular: No carotid bruit or JVD.      Trachea: No tracheal deviation.  Cardiovascular:     Rate and Rhythm: Normal rate and regular rhythm.     Pulses: Normal pulses.     Heart sounds: Normal heart sounds. No murmur heard.  No friction rub. No gallop.   Pulmonary:     Effort: Pulmonary effort is normal. No respiratory distress.     Breath sounds: Normal breath sounds. No wheezing or rales.  Chest:     Chest wall: No tenderness.  Abdominal:     General: Bowel sounds are normal.     Palpations: Abdomen is soft.     Tenderness: There is no abdominal tenderness.  Musculoskeletal:        General: Normal range of motion.     Cervical back: Normal range of motion and neck supple.  Lymphadenopathy:     Cervical: No cervical adenopathy.  Skin:    General: Skin is warm and dry.  Neurological:     General: No focal deficit present.     Mental Status: She is alert and oriented to person, place, and time.     Cranial Nerves: No cranial nerve deficit.  Psychiatric:        Mood and Affect: Mood normal.        Behavior: Behavior normal.        Thought Content: Thought content normal.        Judgment: Judgment normal.    Depression screen [facility] 2/9 12/26/2019 11/28/2019 07/03/2019 04/04/2019 11/29/2018  Decreased Interest 0 0 0 0 0  Down, Depressed, Hopeless 0 0 0 0 0  PHQ - 2 Score 0 0 0 0 0    Functional Status Survey: Is the patient deaf or have difficulty hearing?: No Does the patient have difficulty seeing, even when wearing glasses/contacts?: No Does the patient have difficulty concentrating, remembering, or making decisions?: No Does the patient have difficulty walking or climbing stairs?: No Does the patient have difficulty dressing or bathing?: No Does the patient have difficulty doing errands alone such as visiting a doctor's office or shopping?: No  MMSE - [place] Exam 12/26/2019 11/29/2018 10/14/2017  Orientation to time 5 5 5   Orientation to Place 5 5 5   Registration 3 3 3   Attention/ Calculation 5 5 5    Recall 3 3 3   Language- name 2 objects 2  2 2  Language- repeat 1 1 1   Language- follow 3 step command 3 3 3   Language- read & follow direction 1 1 1   Write a sentence 1 1 1   Copy design 1 1 1   Total score 30 30 30     Fall Risk  12/26/2019 11/28/2019 07/03/2019 04/04/2019 11/29/2018  Falls in the past year? 0 0 0 0 0  Number falls in past yr: - - - - -  Injury with Fall? - - - - -      LABS: Recent Results (from the past 2160 hour(s))  CULTURE, URINE COMPREHENSIVE     Status: None   Collection Time: 11/07/19 11:49 AM   Specimen: Urine   Urine  Result Value Ref Range   Urine Culture, Comprehensive Final report    Organism ID, Bacteria Comment     Comment: Mixed urogenital flora 10,000-25,000 colony forming units per mL     Assessment/Plan: 1. Encounter for general adult medical examination with abnormal findings Annual health maintenance exam today. Orders given to have routine, fasting labs drawn.   2. Essential hypertension Stable. Continue bp medication as prescribed   3. Multinodular goiter Will check referral made previously to endocrinology   4. Stomatitis and mucositis Recommend OTC periodontex toothpaste and routine visit with her dentist for further evaluation   5. Primary insomnia May continue to take ambien 10mg  at bedtime as needed for acute insomnia.  - zolpidem (AMBIEN) 10 MG tablet; Take 1 tablet (10 mg total) by mouth at bedtime as needed for sleep.  Dispense: 30 tablet; Refill: 3  6. History of left breast cancer Continue regular visits with oncology and surgery as scheduled as well as mammograms every six months.   7. Dysuria - UA/M w/rflx Culture, Routine  General Counseling: Tahj verbalizes understanding of the findings of todays visit and agrees with plan of treatment. I have discussed any further diagnostic evaluation that may be needed or ordered today. We also reviewed her medications today. she has been encouraged to call the office with  any questions or concerns that should arise related to todays visit.    Counseling:  This patient was seen by Leretha Pol FNP Collaboration with Dr Lavera Guise as a part of collaborative care agreement  Orders Placed This Encounter  Procedures  . UA/M w/rflx Culture, Routine    Meds ordered this encounter  Medications  . zolpidem (AMBIEN) 10 MG tablet    Sig: Take 1 tablet (10 mg total) by mouth at bedtime as needed for sleep.    Dispense:  30 tablet    Refill:  3    Order Specific Question:   Supervising Provider    Answer:   Lavera Guise [[id number]]    Total time spent: 39 Minutes  Time spent includes review of chart, medications, test results, and follow up plan with the patient.     Lavera Guise, MD  Internal Medicine

## 2019-12-27 NOTE — Progress Notes (Signed)
Holding for culture and sensitivity.

## 2020-01-02 ENCOUNTER — Other Ambulatory Visit: Payer: Self-pay | Admitting: Nurse Practitioner

## 2020-01-02 DIAGNOSIS — N390 Urinary tract infection, site not specified: Secondary | ICD-10-CM

## 2020-01-02 LAB — UA/M W/RFLX CULTURE, ROUTINE
Bilirubin, UA: NEGATIVE
Glucose, UA: NEGATIVE
Ketones, UA: NEGATIVE
Nitrite, UA: NEGATIVE
Protein,UA: NEGATIVE
RBC, UA: NEGATIVE
Specific Gravity, UA: 1.014 (ref 1.005–1.030)
Urobilinogen, Ur: 0.2 mg/dL (ref 0.2–1.0)
pH, UA: 6.5 (ref 5.0–7.5)

## 2020-01-02 LAB — MICROSCOPIC EXAMINATION
Casts: NONE SEEN /lpf
RBC, Urine: NONE SEEN /hpf (ref 0–2)

## 2020-01-02 LAB — URINE CULTURE, REFLEX

## 2020-01-02 MED ORDER — CIPROFLOXACIN HCL 500 MG PO TABS: 500.0000 mg | ORAL_TABLET | Freq: Two times a day (BID) | ORAL | 0 refills | Status: DC

## 2020-01-02 NOTE — Progress Notes (Signed)
Please let the patient know that urine from her physical showed evidence of urinary tract infection. I have added cipro 500mg  for her to take twice daily for next 7 days and sent to her pharmacy. Thanks.

## 2020-01-02 NOTE — Progress Notes (Signed)
Lmom to call us back 

## 2020-01-03 ENCOUNTER — Telehealth: Payer: Self-pay

## 2020-01-03 NOTE — Telephone Encounter (Signed)
Spoke with urine did showed UTI but pt already on amoxicillin as per heather advised its ok just hold on cipro amoxicillin will covered uti

## 2020-01-03 NOTE — Telephone Encounter (Signed)
-----   Message from Ronnell Freshwater, NP sent at 01/02/2020  8:56 AM EDT ----- Please let the patient know that urine from her physical showed evidence of urinary tract infection. I have added cipro 500mg  for her to take twice daily for next 7 days and sent to her pharmacy. Thanks.

## 2020-01-15 ENCOUNTER — Other Ambulatory Visit: Payer: Self-pay | Admitting: Nurse Practitioner

## 2020-01-15 DIAGNOSIS — Z1231 Encounter for screening mammogram for malignant neoplasm of breast: Secondary | ICD-10-CM

## 2020-02-08 ENCOUNTER — Ambulatory Visit: Payer: PPO | Admitting: Urology

## 2020-02-16 ENCOUNTER — Other Ambulatory Visit: Payer: Self-pay

## 2020-02-16 ENCOUNTER — Ambulatory Visit
Admission: RE | Admit: 2020-02-16 | Discharge: 2020-02-16 | Disposition: A | Discharge status: Home / Self Care | Payer: PPO | Source: Ambulatory Visit | Attending: Nurse Practitioner | Admitting: Nurse Practitioner

## 2020-02-16 DIAGNOSIS — Z1231 Encounter for screening mammogram for malignant neoplasm of breast: Secondary | ICD-10-CM | POA: Diagnosis not present

## 2020-02-19 NOTE — Progress Notes (Signed)
Negative mammogram

## 2020-03-04 ENCOUNTER — Other Ambulatory Visit: Payer: Self-pay | Admitting: Nurse Practitioner

## 2020-03-04 ENCOUNTER — Telehealth: Payer: Self-pay

## 2020-03-04 ENCOUNTER — Other Ambulatory Visit: Payer: Self-pay

## 2020-03-04 DIAGNOSIS — B3731 Acute candidiasis of vulva and vagina: Secondary | ICD-10-CM

## 2020-03-04 DIAGNOSIS — B373 Candidiasis of vulva and vagina: Secondary | ICD-10-CM

## 2020-03-04 MED ORDER — FLUCONAZOLE 150 MG PO TABS: ORAL_TABLET | ORAL | 0 refills | Status: DC

## 2020-03-04 NOTE — Telephone Encounter (Signed)
Pt informed of prescription sent to pharmacy.  dbs

## 2020-03-04 NOTE — Telephone Encounter (Signed)
Sent diflucan 150mg  to the pharmacy. Take once. If symptoms are persistent, she can repeat the dose in three days.

## 2020-03-18 ENCOUNTER — Other Ambulatory Visit: Payer: Self-pay | Admitting: Nurse Practitioner

## 2020-03-18 DIAGNOSIS — E559 Vitamin D deficiency, unspecified: Secondary | ICD-10-CM | POA: Diagnosis not present

## 2020-03-18 DIAGNOSIS — I10 Essential (primary) hypertension: Secondary | ICD-10-CM | POA: Diagnosis not present

## 2020-03-18 DIAGNOSIS — Z118 Encounter for screening for other infectious and parasitic diseases: Secondary | ICD-10-CM | POA: Diagnosis not present

## 2020-03-18 DIAGNOSIS — Z0001 Encounter for general adult medical examination with abnormal findings: Secondary | ICD-10-CM | POA: Diagnosis not present

## 2020-03-18 DIAGNOSIS — E782 Mixed hyperlipidemia: Secondary | ICD-10-CM | POA: Diagnosis not present

## 2020-03-19 LAB — COMPREHENSIVE METABOLIC PANEL
ALT: 14 IU/L (ref 0–32)
AST: 19 IU/L (ref 0–40)
Albumin/Globulin Ratio: 1.3 (ref 1.2–2.2)
Albumin: 4.3 g/dL (ref 3.8–4.8)
Alkaline Phosphatase: 94 IU/L (ref 44–121)
BUN/Creatinine Ratio: 14 (ref 12–28)
BUN: 9 mg/dL (ref 8–27)
Bilirubin Total: 0.4 mg/dL (ref 0.0–1.2)
CO2: 26 mmol/L (ref 20–29)
Calcium: 10.6 mg/dL — ABNORMAL HIGH (ref 8.7–10.3)
Chloride: 101 mmol/L (ref 96–106)
Creatinine, Ser: 0.64 mg/dL (ref 0.57–1.00)
GFR calc Af Amer: 105 mL/min/{1.73_m2} (ref >59)
GFR calc non Af Amer: 91 mL/min/{1.73_m2} (ref >59)
Globulin, Total: 3.3 g/dL (ref 1.5–4.5)
Glucose: 79 mg/dL (ref 65–99)
Potassium: 3.6 mmol/L (ref 3.5–5.2)
Sodium: 139 mmol/L (ref 134–144)
Total Protein: 7.6 g/dL (ref 6.0–8.5)

## 2020-03-19 LAB — CBC
Hematocrit: 34.1 % (ref 34.0–46.6)
Hemoglobin: 11.2 g/dL (ref 11.1–15.9)
MCH: 27.8 pg (ref 26.6–33.0)
MCHC: 32.8 g/dL (ref 31.5–35.7)
MCV: 85 fL (ref 79–97)
Platelets: 151 10*3/uL (ref 150–450)
RBC: 4.03 x10E6/uL (ref 3.77–5.28)
RDW: 13.6 % (ref 11.7–15.4)
WBC: 7.9 10*3/uL (ref 3.4–10.8)

## 2020-03-19 LAB — LIPID PANEL WITH LDL/HDL RATIO
Cholesterol, Total: 271 mg/dL — ABNORMAL HIGH (ref 100–199)
HDL: 65 mg/dL (ref >39)
LDL Chol Calc (NIH): 186 mg/dL — ABNORMAL HIGH (ref 0–99)
LDL/HDL Ratio: 2.9 ratio (ref 0.0–3.2)
Triglycerides: 114 mg/dL (ref 0–149)
VLDL Cholesterol Cal: 20 mg/dL (ref 5–40)

## 2020-03-19 LAB — T4, FREE: Free T4: 0.99 ng/dL (ref 0.82–1.77)

## 2020-03-19 LAB — VITAMIN D 25 HYDROXY (VIT D DEFICIENCY, FRACTURES): Vit D, 25-Hydroxy: 84.7 ng/mL (ref 30.0–100.0)

## 2020-03-19 LAB — HCV AB W REFLEX TO QUANT PCR: HCV Ab: <0.1 s/co ratio (ref 0.0–0.9)

## 2020-03-19 LAB — HCV INTERPRETATION

## 2020-03-19 LAB — TSH: TSH: 0.776 u[IU]/mL (ref 0.450–4.500)

## 2020-04-07 ENCOUNTER — Other Ambulatory Visit: Payer: Self-pay | Admitting: Nurse Practitioner

## 2020-04-07 DIAGNOSIS — M170 Bilateral primary osteoarthritis of knee: Secondary | ICD-10-CM

## 2020-04-07 MED ORDER — DICLOFENAC SODIUM 1 % EX GEL: 4.0000 g | Freq: Four times a day (QID) | CUTANEOUS | 5 refills | Status: DC | PRN

## 2020-04-08 ENCOUNTER — Other Ambulatory Visit: Payer: Self-pay

## 2020-04-08 DIAGNOSIS — M170 Bilateral primary osteoarthritis of knee: Secondary | ICD-10-CM

## 2020-04-08 MED ORDER — DICLOFENAC SODIUM 1 % EX GEL: 4.0000 g | Freq: Four times a day (QID) | CUTANEOUS | 5 refills | Status: DC | PRN

## 2020-04-11 ENCOUNTER — Other Ambulatory Visit: Payer: Self-pay | Admitting: Urology

## 2020-04-22 ENCOUNTER — Encounter: Payer: Self-pay | Admitting: Nurse Practitioner

## 2020-04-22 ENCOUNTER — Other Ambulatory Visit: Payer: Self-pay

## 2020-04-22 ENCOUNTER — Ambulatory Visit (INDEPENDENT_AMBULATORY_CARE_PROVIDER_SITE_OTHER): Payer: PPO | Admitting: Nurse Practitioner

## 2020-04-22 VITALS — BP 166/90 | HR 85 | Temp 97.4°F | Resp 16 | Ht 63.0 in | Wt 199.4 lb

## 2020-04-22 DIAGNOSIS — E785 Hyperlipidemia, unspecified: Secondary | ICD-10-CM | POA: Insufficient documentation

## 2020-04-22 DIAGNOSIS — I10 Essential (primary) hypertension: Secondary | ICD-10-CM

## 2020-04-22 DIAGNOSIS — M170 Bilateral primary osteoarthritis of knee: Secondary | ICD-10-CM | POA: Diagnosis not present

## 2020-04-22 DIAGNOSIS — G620 Drug-induced polyneuropathy: Secondary | ICD-10-CM

## 2020-04-22 DIAGNOSIS — F5101 Primary insomnia: Secondary | ICD-10-CM | POA: Diagnosis not present

## 2020-04-22 DIAGNOSIS — Z853 Personal history of malignant neoplasm of breast: Secondary | ICD-10-CM

## 2020-04-22 DIAGNOSIS — T451X5A Adverse effect of antineoplastic and immunosuppressive drugs, initial encounter: Secondary | ICD-10-CM | POA: Diagnosis not present

## 2020-04-22 MED ORDER — ZOLPIDEM TARTRATE 10 MG PO TABS: 10.0000 mg | ORAL_TABLET | Freq: Every evening | ORAL | 3 refills | Status: DC | PRN

## 2020-04-22 MED ORDER — DICLOFENAC SODIUM 50 MG PO TBEC: 50.0000 mg | DELAYED_RELEASE_TABLET | Freq: Two times a day (BID) | ORAL | 1 refills | Status: DC | PRN

## 2020-04-22 NOTE — Progress Notes (Signed)
[facility] [place], [place] [address]  Internal MEDICINE  Office Visit Note  Patient Name: Regina Deleon  [id number]  [id number]  Date of Service: 04/22/2020  Chief Complaint  Patient presents with   Follow-up    Body aches started 2 days ago, pt fully vaccinated, refill request    Gastroesophageal Reflux   Hypertension   Hyperlipidemia    The patient is here for routine follow up. She did hae routine, fasting labs done prior to this visit. LDL and total cholesterol wre moderately elevated. Her HDL/LDL ratio is 2.9 indicating a less than average risk for developing CV problems related to lipids. She does take crestor three times a week. She is intolerant to taking it more often. Causes her to have severe joint and muscle pain.  She is having increased pain in the right leg. She has neuropathy in both legs after treatment with chemotherapy treatment for breast cancer. She is using topical voltaren gel which helps some, but needs to have something a bit more than that. She is cancer free.  Does need to have refill of her ambien 10mg  at bedtime when needed. She has tried trazodone in the past. Caused her to have very bad dreams. At one point, was taking alprazolam as needed. Oncologist changed this to [place] during cancer treatment to help with sleep. She has done with this ever since. She does need to have refills for this today.  Blood pressure is moderately elevated at this time. She states that she did not take any of her medication today.       Current Medication: Outpatient Encounter Medications as of 04/22/2020  Medication Sig Note   acetaminophen (TYLENOL) 500 MG tablet Take 1,000 mg by mouth every 6 (six) hours as needed for mild pain.     aspirin 81 MG tablet Take 81 mg by mouth daily.     cetirizine (ZYRTEC) 10 MG tablet Take 10 mg by mouth daily as needed for allergies.    Cholecalciferol (VITAMIN D-3) 125 MCG (5000 UT) TABS Take 5,000  mg by mouth daily.    diclofenac Sodium (VOLTAREN) 1 % GEL Apply 4 g topically 4 (four) times daily as needed.    docusate sodium (COLACE) 100 MG capsule Take 200 mg by mouth daily.    dorzolamide-timolol (COSOPT) 22.3-6.8 MG/ML ophthalmic solution Place 1 drop into both eyes 2 (two) times daily.    fluconazole (DIFLUCAN) 150 MG tablet Take 1 tablet po once. May repeat dose in 3 days as needed for persistent symptoms.    gabapentin (NEURONTIN) 100 MG capsule Take 1 capsule po qam and two capsules po QPM    latanoprost (XALATAN) 0.005 % ophthalmic solution Place 1 drop into both eyes at bedtime.    NIFEdipine (PROCARDIA-XL/NIFEDICAL-XL) 30 MG 24 hr tablet Take 1 tablet (30 mg total) by mouth 2 (two) times daily.    Omega 3 1000 MG CAPS Take 1 capsule (1,000 mg total) by mouth daily.    omega-3 acid ethyl esters (LOVAZA) 1 g capsule TAKE 1 CAPSULE (1,000 MG TOTAL) BY MOUTH DAILY.    omeprazole (PRILOSEC OTC) 20 MG tablet Take 20 mg by mouth daily as needed (acid reflux).    ondansetron (ZOFRAN) 4 MG tablet Take 1 tablet (4 mg total) by mouth every 8 (eight) hours as needed for nausea or vomiting.    rosuvastatin (CRESTOR) 5 MG tablet TAKE 1 TABLET (5 MG TOTAL) BY MOUTH 3 (THREE) TIMES A WEEK.  tamsulosin (FLOMAX) 0.4 MG CAPS capsule Take 1 capsule (0.4 mg total) by mouth daily after supper.    traMADol (ULTRAM) 50 MG tablet Take 1 tablet (50 mg total) by mouth every 8 (eight) hours as needed for moderate pain.    triamterene-hydrochlorothiazide (MAXZIDE-25) 37.5-25 MG tablet Take 1 tablet by mouth daily.    [DISCONTINUED] ciprofloxacin (CIPRO) 500 MG tablet Take 1 tablet (500 mg total) by mouth 2 (two) times daily.    [DISCONTINUED] ibuprofen (ADVIL) 800 MG tablet Take 1 tablet (800 mg total) by mouth 2 (two) times daily as needed. 04/22/2020: ineffective. will try diclofenac 50mg  twice daily as needed.    [DISCONTINUED] zolpidem (AMBIEN) 10 MG tablet Take 1 tablet (10 mg total)  by mouth at bedtime as needed for sleep.    diclofenac (VOLTAREN) 50 MG EC tablet Take 1 tablet (50 mg total) by mouth 2 (two) times daily as needed for moderate pain.    zolpidem (AMBIEN) 10 MG tablet Take 1 tablet (10 mg total) by mouth at bedtime as needed for sleep.    No facility-administered encounter medications on file as of 04/22/2020.    Surgical History: Past Surgical History:  Procedure Laterality Date   ABDOMINAL HYSTERECTOMY  1986   AXILLARY LYMPH NODE DISSECTION Left 06/26/2014   Procedure: AXILLARY LYMPH NODE DISSECTION;  Surgeon: Alphonsa Overall, MD;  Location: [place];  Service: General;  Laterality: Left;   [place]   lumbar   BREAST SURGERY Left 06/2014   CATARACT EXTRACTION W/PHACO Right 03/23/2019   Procedure: CATARACT EXTRACTION PHACO AND INTRAOCULAR LENS PLACEMENT ([place]) RIGHT VISION BLUE;  Surgeon: Marchia Meiers, MD;  Location: [facility];  Service: Ophthalmology;  Laterality: Right;  [place] 00:55.4 CDE 5.92 Fluid Pack lot # [id number]   COLONOSCOPY     COLONOSCOPY WITH PROPOFOL N/A 12/29/2017   Procedure: COLONOSCOPY WITH PROPOFOL;  Surgeon: [place], Benay Pike, MD;  Location: [facility];  Service: Gastroenterology;  Laterality: N/A;   CYSTOSCOPY/URETEROSCOPY/HOLMIUM LASER/STENT PLACEMENT Bilateral 07/28/2019   Procedure: CYSTOSCOPY/URETEROSCOPY/HOLMIUM LASER/STENT PLACEMENT;  Surgeon: Billey Co, MD;  Location: [facility];  Service: Urology;  Laterality: Bilateral;   DENTAL SURGERY Right    top    DILATION AND CURETTAGE OF UTERUS     LAPAROSCOPIC GASTRIC BANDING  10/19/05   MASTECTOMY Left 2016   MASTECTOMY MODIFIED RADICAL Left 06/26/2014   Procedure: MASTECTOMY MODIFIED RADICAL;  Surgeon: Alphonsa Overall, MD;  Location: [place];  Service: General;  Laterality: Left;   PORT A CATH REVISION     insertion-10/15   PORT-A-CATH REMOVAL N/A 02/07/2015   Procedure: MINOR REMOVAL PORT-A-CATH;  Surgeon: Alphonsa Overall, MD;  Location: [place];  Service: General;  Laterality: N/A;    Medical History: Past Medical History:  Diagnosis Date   Cancer ([place])    lt br cancer   Edema leg    with chemo   GERD (gastroesophageal reflux disease)    also, diverticulosis   History of kidney stones    Hyperlipidemia    Hypertension    Neuropathy    hands and feet post chemo   Wears glasses     Family History: Family History  Problem Relation Age of Onset   Asthma Mother    Diabetes Mother    Diabetes Father    Heart disease Father    Cancer Maternal Aunt    Cancer [place]  Social History   Socioeconomic History   Marital status: Married    Spouse name: [place] Bells   Number of children: Not on file   Years of education: Not on file   Highest education level: Not on file  Occupational History   Not on file  Tobacco Use   Smoking status: Never Smoker   Smokeless tobacco: Never Used  Vaping Use   Vaping Use: Never used  Substance and Sexual Activity   Alcohol use: No   Drug use: No   Sexual activity: Yes  Other Topics Concern   Not on file  Social History Narrative   Not on file   Social Determinants of Health   Financial Resource Strain: Not on file  Food Insecurity: Not on file  Transportation Needs: Not on file  Physical Activity: Not on file  Stress: Not on file  Social Connections: Not on file  Intimate Partner Violence: Not on file      Review of Systems  Constitutional: Positive for fatigue. Negative for activity change, chills and unexpected weight change.  HENT: Negative for congestion, postnasal drip, rhinorrhea, sneezing and sore throat.   Respiratory: Negative for cough, chest tightness, shortness of breath and wheezing.   Cardiovascular: Negative for chest pain and palpitations.       Elevated blood pressure today. Patient states that she has not taken any of her medications yet  today.   Gastrointestinal: Negative for abdominal pain, constipation, diarrhea, nausea and vomiting.  Endocrine: Negative for cold intolerance, heat intolerance, polydipsia and polyuria.  Musculoskeletal: Positive for arthralgias and myalgias. Negative for back pain, joint swelling and neck pain.       Right leg pain and burning sensation.   Skin: Negative for rash.  Allergic/Immunologic: Negative for environmental allergies.  Neurological: Positive for numbness. Negative for dizziness, tremors and headaches.       Numbness and tingling sensation in both legs, worse on the right side.   Hematological: Negative for adenopathy. Does not bruise/bleed easily.  Psychiatric/Behavioral: Positive for sleep disturbance. Negative for behavioral problems (Depression) and suicidal ideas. The patient is not nervous/anxious.     Today's Vitals   04/22/20 0853  BP: (!) 166/90  Pulse: 85  Resp: 16  Temp: (!) 97.4 F (36.3 C)  SpO2: 98%  Weight: 199 lb 6.4 oz (90.4 kg)  Height: 5\' 3"  (1.6 m)   Body mass index is 35.32 kg/m.  Physical Exam Vitals and nursing note reviewed.  Constitutional:      General: She is not in acute distress.    Appearance: Normal appearance. She is well-developed and well-nourished. She is not diaphoretic.  HENT:     Head: Normocephalic and atraumatic.     Nose: Nose normal.     Mouth/Throat:     Mouth: Oropharynx is clear and moist.     Pharynx: No oropharyngeal exudate.  Eyes:     Extraocular Movements: EOM normal.     Pupils: Pupils are equal, round, and reactive to light.  Neck:     Thyroid: No thyromegaly.     Vascular: No carotid bruit or JVD.     Trachea: No tracheal deviation.  Cardiovascular:     Rate and Rhythm: Normal rate and regular rhythm.     Pulses: Normal pulses.     Heart sounds: Normal heart sounds. No murmur heard. No friction rub. No gallop.   Pulmonary:     Effort: Pulmonary effort is normal. No respiratory distress.     Breath sounds:  Normal breath sounds. No wheezing or rales.  Chest:     Chest wall: No tenderness.  Abdominal:     Palpations: Abdomen is soft.  Musculoskeletal:        General: Normal range of motion.     Cervical back: Normal range of motion and neck supple.  Lymphadenopathy:     Cervical: No cervical adenopathy.  Skin:    General: Skin is warm and dry.  Neurological:     Mental Status: She is alert and oriented to person, place, and time.     Cranial Nerves: No cranial nerve deficit.  Psychiatric:        Mood and Affect: Mood and affect and mood normal.        Behavior: Behavior normal.        Thought Content: Thought content normal.        Judgment: Judgment normal.    Assessment/Plan:  1. Essential hypertension Generally well controlled. Patient has not taken medications yet today. Will take as soon as she gets home. Will monitor blood pressure closely.   2. Hyperlipidemia, unspecified hyperlipidemia type Patient relatively intolerant to statins. Is taking rosuvastatin 5mg  three times weekly.   3. Primary osteoarthritis of knees, bilateral D/c ibuprofen. Add voltaren 50mg  up to twice daily as needed for pain/inflammation. May continue to use topical voltaren gel as needed and as prescribed.  - diclofenac (VOLTAREN) 50 MG EC tablet; Take 1 tablet (50 mg total) by mouth 2 (two) times daily as needed for moderate pain.  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 Primary insomnia Ha tried multiple medications to help with insomnia and Lorrin Mais has been most effective. Continue 10mg  at bedtime as needed. New prescription sent to her pharmacy today.  - zolpidem (AMBIEN) 10 MG tablet; Take 1 tablet (10 mg total) by mouth at bedtime as needed for sleep.  Dispense: 30 tablet; Refill: 3  6. History of left breast cancer Seven years outfrom initial diagnosis. Most recent mammogram done 02/19/2020 and was  negative. Yearly screening recommended.   General Counseling: Regina Deleon understanding of the findings of todays visit and agrees with plan of treatment. I have discussed any further diagnostic evaluation that may be needed or ordered today. We also reviewed her medications today. she has been encouraged to call the office with any questions or concerns that should arise related to todays visit.  Hypertension Counseling:   The following hypertensive lifestyle modification were recommended and discussed:  1. Limiting alcohol intake to less than 1 oz/day of ethanol:(24 oz of beer or 8 oz of wine or 2 oz of 100-proof whiskey). 2. Take baby ASA 81 mg daily. 3. Importance of regular aerobic exercise and losing weight. 4. Reduce dietary saturated fat and cholesterol intake for overall cardiovascular health. 5. Maintaining adequate dietary potassium, calcium, and magnesium intake. 6. Regular monitoring of the blood pressure. 7. Reduce sodium intake to less than 100 mmol/day (less than 2.3 gm of sodium or less than 6 gm of sodium choride)   This patient was seen by [place] with Dr Lavera Guise as a part of collaborative care agreement  Meds ordered this encounter  Medications   zolpidem (AMBIEN) 10 MG tablet    Sig: Take 1 tablet (10 mg total) by mouth at bedtime as needed for sleep.    Dispense:  30 tablet    Refill:  3  Order Specific Question:   Supervising Provider    Answer:   Lavera Guise [[id number]]   diclofenac (VOLTAREN) 50 MG EC tablet    Sig: Take 1 tablet (50 mg total) by mouth 2 (two) times daily as needed for moderate pain.    Dispense:  60 tablet    Refill:  1    Motrin 800mg  ineffective for her.    Order Specific Question:   Supervising Provider    Answer:   Lavera Guise [[id number]]    Total time spent: 30 Minutes   Time spent includes review of chart, medications, test results, and follow up plan with the patient.      Dr Lavera Guise Internal medicine

## 2020-04-30 ENCOUNTER — Other Ambulatory Visit: Payer: Self-pay | Admitting: Nurse Practitioner

## 2020-04-30 ENCOUNTER — Telehealth: Payer: Self-pay

## 2020-04-30 DIAGNOSIS — B3731 Acute candidiasis of vulva and vagina: Secondary | ICD-10-CM

## 2020-04-30 DIAGNOSIS — J069 Acute upper respiratory infection, unspecified: Secondary | ICD-10-CM

## 2020-04-30 MED ORDER — AZITHROMYCIN 250 MG PO TABS: ORAL_TABLET | ORAL | 0 refills | Status: DC

## 2020-04-30 MED ORDER — FLUCONAZOLE 150 MG PO TABS: ORAL_TABLET | ORAL | 0 refills | Status: DC

## 2020-04-30 MED ORDER — FLUTICASONE PROPIONATE 50 MCG/ACT NA SUSP: 2.0000 | Freq: Every day | NASAL | 6 refills | Status: DC

## 2020-04-30 NOTE — Telephone Encounter (Signed)
Please let the patient know I sent z-pack. She should take as directed for 5 days. Added flonase nasal spray. She should use two sprays in both nostrils daily. I also sent in diflucan in case yeast infection develops. She should rest and continue with OTC medications to help with symptom reduction. Thanks.

## 2020-05-01 ENCOUNTER — Telehealth: Payer: Self-pay

## 2020-05-07 ENCOUNTER — Other Ambulatory Visit: Payer: Self-pay | Admitting: Adult Health

## 2020-05-07 DIAGNOSIS — E785 Hyperlipidemia, unspecified: Secondary | ICD-10-CM

## 2020-05-15 ENCOUNTER — Other Ambulatory Visit: Payer: Self-pay

## 2020-05-15 DIAGNOSIS — I10 Essential (primary) hypertension: Secondary | ICD-10-CM

## 2020-05-15 MED ORDER — NIFEDIPINE ER OSMOTIC RELEASE 30 MG PO TB24: 30.0000 mg | ORAL_TABLET | Freq: Two times a day (BID) | ORAL | 0 refills | Status: DC

## 2020-06-06 DIAGNOSIS — H401112 Primary open-angle glaucoma, right eye, moderate stage: Secondary | ICD-10-CM | POA: Diagnosis not present

## 2020-06-14 ENCOUNTER — Other Ambulatory Visit: Payer: Self-pay

## 2020-06-14 DIAGNOSIS — M170 Bilateral primary osteoarthritis of knee: Secondary | ICD-10-CM

## 2020-06-14 MED ORDER — DICLOFENAC SODIUM 50 MG PO TBEC: 50.0000 mg | DELAYED_RELEASE_TABLET | Freq: Two times a day (BID) | ORAL | 0 refills | Status: DC | PRN

## 2020-06-22 ENCOUNTER — Other Ambulatory Visit: Payer: Self-pay | Admitting: Hospice and Palliative Medicine

## 2020-06-22 DIAGNOSIS — I10 Essential (primary) hypertension: Secondary | ICD-10-CM

## 2020-06-23 NOTE — Telephone Encounter (Signed)
dosing

## 2020-07-04 ENCOUNTER — Other Ambulatory Visit: Payer: Self-pay | Admitting: Nurse Practitioner

## 2020-07-04 DIAGNOSIS — I10 Essential (primary) hypertension: Secondary | ICD-10-CM

## 2020-07-04 DIAGNOSIS — H401112 Primary open-angle glaucoma, right eye, moderate stage: Secondary | ICD-10-CM | POA: Diagnosis not present

## 2020-07-08 ENCOUNTER — Other Ambulatory Visit: Payer: Self-pay | Admitting: Hospice and Palliative Medicine

## 2020-07-08 DIAGNOSIS — M170 Bilateral primary osteoarthritis of knee: Secondary | ICD-10-CM

## 2020-07-15 DIAGNOSIS — J0100 Acute maxillary sinusitis, unspecified: Secondary | ICD-10-CM | POA: Diagnosis not present

## 2020-07-15 DIAGNOSIS — Z03818 Encounter for observation for suspected exposure to other biological agents ruled out: Secondary | ICD-10-CM | POA: Diagnosis not present

## 2020-07-19 DIAGNOSIS — H401132 Primary open-angle glaucoma, bilateral, moderate stage: Secondary | ICD-10-CM | POA: Diagnosis not present

## 2020-07-19 DIAGNOSIS — M3501 Sicca syndrome with keratoconjunctivitis: Secondary | ICD-10-CM | POA: Diagnosis not present

## 2020-07-22 ENCOUNTER — Ambulatory Visit: Payer: PPO | Admitting: Hospice and Palliative Medicine

## 2020-07-22 DIAGNOSIS — J329 Chronic sinusitis, unspecified: Secondary | ICD-10-CM | POA: Diagnosis not present

## 2020-08-02 ENCOUNTER — Encounter: Payer: Self-pay | Admitting: Nurse Practitioner

## 2020-08-02 ENCOUNTER — Ambulatory Visit (INDEPENDENT_AMBULATORY_CARE_PROVIDER_SITE_OTHER): Payer: PPO | Admitting: Nurse Practitioner

## 2020-08-02 ENCOUNTER — Other Ambulatory Visit: Payer: Self-pay

## 2020-08-02 VITALS — BP 127/71 | HR 86 | Temp 99.5°F | Ht 63.0 in | Wt 206.1 lb

## 2020-08-02 DIAGNOSIS — I10 Essential (primary) hypertension: Secondary | ICD-10-CM | POA: Diagnosis not present

## 2020-08-02 DIAGNOSIS — K011 Impacted teeth: Secondary | ICD-10-CM | POA: Diagnosis not present

## 2020-08-02 DIAGNOSIS — M5431 Sciatica, right side: Secondary | ICD-10-CM | POA: Diagnosis not present

## 2020-08-02 DIAGNOSIS — Z7689 Persons encountering health services in other specified circumstances: Secondary | ICD-10-CM | POA: Diagnosis not present

## 2020-08-02 DIAGNOSIS — F5101 Primary insomnia: Secondary | ICD-10-CM

## 2020-08-02 DIAGNOSIS — B373 Candidiasis of vulva and vagina: Secondary | ICD-10-CM

## 2020-08-02 DIAGNOSIS — B3731 Acute candidiasis of vulva and vagina: Secondary | ICD-10-CM

## 2020-08-02 MED ORDER — ZOLPIDEM TARTRATE 10 MG PO TABS: 10.0000 mg | ORAL_TABLET | Freq: Every evening | ORAL | 2 refills | Status: DC | PRN

## 2020-08-02 MED ORDER — TRAMADOL HCL 50 MG PO TABS: 50.0000 mg | ORAL_TABLET | Freq: Three times a day (TID) | ORAL | 0 refills | Status: DC | PRN

## 2020-08-02 MED ORDER — FLUCONAZOLE 150 MG PO TABS
ORAL_TABLET | ORAL | 0 refills | Status: DC
Start: 2020-08-02 — End: 2020-11-05

## 2020-08-02 NOTE — Patient Instructions (Signed)
Dental Abscess  A dental abscess is an area of pus in or around a tooth. It comes from an infection. It can cause pain and other symptoms. Treatment will help with symptoms and prevent the infection from spreading. Follow these instructions at home: Medicines  Take over-the-counter and prescription medicines only as told by your dentist.  If you were prescribed an antibiotic medicine, take it as told by your dentist. Do not stop taking it even if you start to feel better.  If you were prescribed a gel that has numbing medicine in it, use it exactly as told.  Do not drive or use heavy machinery (like a lawn mower) while taking prescription pain medicine. General instructions  Rinse out your mouth often with salt water. ? To make salt water, dissolve -1 tsp of salt in 1 cup of warm water.  Eat a soft diet while your mouth is healing.  Drink enough fluid to keep your urine pale yellow.  Do not apply heat to the outside of your mouth.  Do not use any products that contain nicotine or tobacco. These include cigarettes and e-cigarettes. If you need help quitting, ask your doctor.  Keep all follow-up visits as told by your dentist. This is important.   Prevent an abscess  Brush your teeth every morning and every night. Use fluoride toothpaste.  Floss your teeth each day.  Get dental cleanings as often as told by your dentist.  Think about getting dental sealant put on teeth that have deep holes (decay).  Drink water that has fluoride in it. ? Most tap water has fluoride. ? Check the label on bottled water to see if it has fluoride in it.  Drink water instead of sugary drinks.  Eat healthy meals and snacks.  Wear a mouth guard or face shield when you play sports. Contact a doctor if:  Your pain is worse, and medicine does not help. Get help right away if:  You have a fever or chills.  Your symptoms suddenly get worse.  You have a very bad headache.  You have problems  breathing or swallowing.  You have trouble opening your mouth.  You have swelling in your neck or close to your eye. Summary  A dental abscess is an area of pus in or around a tooth. It is caused by an infection.  Treatment will help with symptoms and prevent the infection from spreading.  Take over-the-counter and prescription medicines only as told by your dentist.  To prevent an abscess, take good care of your teeth. Brush your teeth every morning and night. Use floss every day.  Get dental cleanings as often as told by your dentist. This information is not intended to replace advice given to you by your health care provider. Make sure you discuss any questions you have with your health care provider. Document Revised: 11/10/2019 Document Reviewed: 11/10/2019 Elsevier Patient Education  2021 [employer]  

## 2020-08-02 NOTE — Progress Notes (Signed)
New Patient Office Visit  Subjective:  Patient ID: Regina Deleon, female    DOB: 1950/10/11  Age: 70 y.o. MRN: [id number]  CC:  Chief Complaint  Patient presents with  . New Patient (Initial Visit)    HPI Regina Deleon presents to establish new primary care office. She is changing offices to stay with her PCP and maintain continuity of care. She was recently seen at urgent care due to dental abscess. She was treated with doxycycline twice daily. She states that this did not help. She had to be seen again and was started on augmentin. This is helping, however, it has caused her to have yeast infection. She has established care with a dentist. She has impacted and infected wisdom tooth in left lower aspect of her jaw. She will required surgical removal. Dentist changed her antibiotics to cephalexin. She will see oral surgeon to set up procedure to remove the wisdom tooth on 08/19/2020. The dentist gave her a prescription for tylenol #3. She took single dose yesterday which caused her to be awake most of the night. She was unable to sleep at all. She will not take this again as she did not like not being able to sleep. She would not take her ambien with the tylenol #3 as she was afraid of the side effects of mixing the two medications. She states that she has taken tramadol in the past. This helped her pain when she had it and did not cause her negative side effects.  Since she was last see,n, she had cataract removal and plugs placed in her tear ducts.  Her blood pressure is well managed.  She does take ambien at bedtime when needed. We discussed the limitations of and risk factors associated with taking this medication long term. The patient has been takingthis intermittently as needed and has done well. She does need refill for this. Her PDMP was reviewed today. Her Overdose Risk Score is 180. Her refill history is appropriate.  The patient denies chest pain, chest pressure, shortness of  breath, or new headaches. She does have symptoms associated with yeast infection. She has vaginal itching and irritation which started since she was started on antibiotics.   Past Medical History:  Diagnosis Date  . Cancer ([place])    lt br cancer  . Edema leg    with chemo  . GERD (gastroesophageal reflux disease)    also, diverticulosis  . History of kidney stones   . Hyperlipidemia   . Hypertension   . Neuropathy    hands and feet post chemo  . Wears glasses     Past Surgical History:  Procedure Laterality Date  . ABDOMINAL HYSTERECTOMY  1986  . AXILLARY LYMPH NODE DISSECTION Left 06/26/2014   Procedure: AXILLARY LYMPH NODE DISSECTION;  Surgeon: Alphonsa Overall, MD;  Location: [facility];  Service: General;  Laterality: Left;  . [place]   lumbar  . BREAST SURGERY Left 06/2014  . CATARACT EXTRACTION W/PHACO Right 03/23/2019   Procedure: CATARACT EXTRACTION PHACO AND INTRAOCULAR LENS PLACEMENT ([place]) RIGHT VISION BLUE;  Surgeon: Marchia Meiers, MD;  Location: [facility];  Service: Ophthalmology;  Laterality: Right;  [place] 00:55.4 CDE 5.92 Fluid Pack lot # [id number]  . COLONOSCOPY    . COLONOSCOPY WITH PROPOFOL N/A 12/29/2017   Procedure: COLONOSCOPY WITH PROPOFOL;  Surgeon: Toledo, Benay Pike, MD;  Location: [facility];  Service: Gastroenterology;  Laterality: N/A;  . CYSTOSCOPY/URETEROSCOPY/HOLMIUM LASER/STENT PLACEMENT Bilateral 07/28/2019  Procedure: CYSTOSCOPY/URETEROSCOPY/HOLMIUM LASER/STENT PLACEMENT;  Surgeon: Billey Co, MD;  Location: [facility];  Service: Urology;  Laterality: Bilateral;  . DENTAL SURGERY Right    top   . DILATION AND CURETTAGE OF UTERUS    . LAPAROSCOPIC GASTRIC BANDING  10/19/05  . MASTECTOMY Left 2016  . MASTECTOMY MODIFIED RADICAL Left 06/26/2014   Procedure: MASTECTOMY MODIFIED RADICAL;  Surgeon: Alphonsa Overall, MD;  Location: [facility];  Service: General;  Laterality: Left;  . PORT A CATH REVISION      insertion-10/15  . PORT-A-CATH REMOVAL N/A 02/07/2015   Procedure: MINOR REMOVAL PORT-A-CATH;  Surgeon: Alphonsa Overall, MD;  Location: [facility];  Service: General;  Laterality: N/A;    Family History  Problem Relation Age of Onset  . Asthma Mother   . Diabetes Mother   . Diabetes Father   . Heart disease Father   . Cancer Maternal Aunt   . Cancer Cousin   . Cancer Cousin   . Cancer Cousin     Social History   Socioeconomic History  . Marital status: Married    Spouse name: [place] Bells  . Number of children: Not on file  . Years of education: Not on file  . Highest education level: Not on file  Occupational History  . Not on file  Tobacco Use  . Smoking status: Never Smoker  . Smokeless tobacco: Never Used  Vaping Use  . Vaping Use: Never used  Substance and Sexual Activity  . Alcohol use: No  . Drug use: No  . Sexual activity: Yes  Other Topics Concern  . Not on file  Social History Narrative  . Not on file   Social Determinants of Health   Financial Resource Strain: Not on file  Food Insecurity: Not on file  Transportation Needs: Not on file  Physical Activity: Not on file  Stress: Not on file  Social Connections: Not on file  Intimate Partner Violence: Not on file    ROS Review of Systems  Constitutional: Negative for activity change, chills and fever.  HENT: Positive for dental problem. Negative for congestion, postnasal drip, rhinorrhea, sinus pressure and sinus pain.        Impaction and infection of left lower wisdom tooth. Will require services of oral surgeon for removal.   Eyes: Negative.   Respiratory: Negative for cough, shortness of breath and wheezing.   Cardiovascular: Negative for chest pain and palpitations.  Gastrointestinal: Negative for constipation, diarrhea, nausea and vomiting.  Endocrine: Negative.   Genitourinary: Positive for vaginal discharge.       Vaginal itching and irritation.   Musculoskeletal: Negative for back  pain and myalgias.  Skin: Negative for rash.  Allergic/Immunologic: Negative for environmental allergies.  Neurological: Negative for dizziness, weakness and headaches.  Hematological: Negative for adenopathy.  Psychiatric/Behavioral: Positive for sleep disturbance. The patient is not nervous/anxious.   All other systems reviewed and are negative.   Objective:   Today's Vitals   08/02/20 0906  BP: 127/71  Pulse: 86  Temp: 99.5 F (37.5 C)  SpO2: 98%  Weight: 206 lb 1.6 oz (93.5 kg)  Height: 5\' 3"  (1.6 m)   Body mass index is 36.51 kg/m.   Physical Exam Vitals and nursing note reviewed.  Constitutional:      Appearance: Normal appearance. She is well-developed. She is obese.  HENT:     Head: Normocephalic and atraumatic.     Nose: Nose normal.     Mouth/Throat:  Dentition: Dental abscesses present.  Eyes:     Pupils: Pupils are equal, round, and reactive to light.  Neck:     Vascular: No carotid bruit.  Cardiovascular:     Rate and Rhythm: Normal rate and regular rhythm.     Pulses: Normal pulses.     Heart sounds: Normal heart sounds.  Pulmonary:     Effort: Pulmonary effort is normal.     Breath sounds: Normal breath sounds.  Abdominal:     Palpations: Abdomen is soft.  Musculoskeletal:        General: Normal range of motion.     Cervical back: Normal range of motion and neck supple.  Skin:    General: Skin is warm and dry.     Capillary Refill: Capillary refill takes less than 2 seconds.  Neurological:     General: No focal deficit present.     Mental Status: She is alert and oriented to person, place, and time.  Psychiatric:        Mood and Affect: Mood normal.        Behavior: Behavior normal.        Thought Content: Thought content normal.        Judgment: Judgment normal.     Assessment & Plan:  1. Encounter to establish care Encounter to establish new primary care office with current PCP and maintain continuity of care.   2. Essential  hypertension Stable. Continue bp medication as prescribed.   3. Tooth, impacted Continue antibiotics as prescribed. Consult with oral surgeon as scheduled.   4. Right sided sciatica D/c previous prescription for Tylenol #3. Will give short term prescription for tramadol 50mg . This may be taken up to three times daily as needed for pain. Advised her to take with caution, as this may cause dizziness or drowsiness. She voiced understanding and will take only if needed. Prescription for #15 tablets was sent to her pharmacy today.  - traMADol (ULTRAM) 50 MG tablet; Take 1 tablet (50 mg total) by mouth every 8 (eight) hours as needed for moderate pain.  Dispense: 15 tablet; Refill: 0  5. Vaginal candidiasis Take diflucan 150mg  one time. May repeat the dose in 3 days for persistent symptoms.  - fluconazole (DIFLUCAN) 150 MG tablet; Take 1 tablet po once. May repeat dose in 3 days as needed for persistent symptoms.  Dispense: 5 tablet; Refill: 0  6. Primary insomnia May take ambien 10mg  at bedtime as needed. Recommend she take 1/2 tablet and take 1 tablet only if needed. New prescription sent to her pharmacy today.  - zolpidem (AMBIEN) 10 MG tablet; Take 1 tablet (10 mg total) by mouth at bedtime as needed for sleep.  Dispense: 30 tablet; Refill: 2  Problem List Items Addressed This Visit      Cardiovascular and Mediastinum   Essential hypertension     Digestive   Tooth, impacted     Nervous and Auditory   Right sided sciatica   Relevant Medications   traMADol (ULTRAM) 50 MG tablet   zolpidem (AMBIEN) 10 MG tablet     Genitourinary   Vaginal candidiasis   Relevant Medications   fluconazole (DIFLUCAN) 150 MG tablet     Other   Primary insomnia   Relevant Medications   zolpidem (AMBIEN) 10 MG tablet   Encounter to establish care - Primary      Outpatient Encounter Medications as of 08/02/2020  Medication Sig  . acetaminophen (TYLENOL) 500 MG tablet Take 1,000 mg by  mouth every 6  (six) hours as needed for mild pain.   Marland Kitchen aspirin 81 MG tablet Take 81 mg by mouth daily.   . cetirizine (ZYRTEC) 10 MG tablet Take 10 mg by mouth daily as needed for allergies.  . Cholecalciferol (VITAMIN D-3) 125 MCG (5000 UT) TABS Take 5,000 mg by mouth daily.  . diclofenac (VOLTAREN) 50 MG EC tablet TAKE 1 TABLET (50 MG TOTAL) BY MOUTH 2 (TWO) TIMES DAILY AS NEEDED FOR MODERATE PAIN.  Marland Kitchen diclofenac Sodium (VOLTAREN) 1 % GEL Apply 4 g topically 4 (four) times daily as needed.  . docusate sodium (COLACE) 100 MG capsule Take 200 mg by mouth daily.  . dorzolamide-timolol (COSOPT) 22.3-6.8 MG/ML ophthalmic solution Place 1 drop into both eyes 2 (two) times daily.  . fluticasone (FLONASE) 50 MCG/ACT nasal spray Place 2 sprays into both nostrils daily.  Marland Kitchen gabapentin (NEURONTIN) 100 MG capsule Take 1 capsule po qam and two capsules po QPM  . latanoprost (XALATAN) 0.005 % ophthalmic solution Place 1 drop into both eyes at bedtime.  Marland Kitchen NIFEdipine (PROCARDIA-XL/NIFEDICAL-XL) 30 MG 24 hr tablet TAKE 1 TABLET BY MOUTH 2 TIMES DAILY.  Marland Kitchen Omega 3 1000 MG CAPS Take 1 capsule (1,000 mg total) by mouth daily.  Marland Kitchen omega-3 acid ethyl esters (LOVAZA) 1 g capsule TAKE 1 CAPSULE (1,000 MG TOTAL) BY MOUTH DAILY.  Marland Kitchen omeprazole (PRILOSEC OTC) 20 MG tablet Take 20 mg by mouth daily as needed (acid reflux).  . rosuvastatin (CRESTOR) 5 MG tablet TAKE 1 TABLET (5 MG TOTAL) BY MOUTH 3 (THREE) TIMES A WEEK.  . triamterene-hydrochlorothiazide (MAXZIDE-25) 37.5-25 MG tablet Take 1 tablet by mouth daily.  . [DISCONTINUED] azithromycin (ZITHROMAX) 250 MG tablet z-pack - take as directed for 5 days  . [DISCONTINUED] zolpidem (AMBIEN) 10 MG tablet Take 1 tablet (10 mg total) by mouth at bedtime as needed for sleep.  . fluconazole (DIFLUCAN) 150 MG tablet Take 1 tablet po once. May repeat dose in 3 days as needed for persistent symptoms.  . ondansetron (ZOFRAN) 4 MG tablet Take 1 tablet (4 mg total) by mouth every 8 (eight) hours as  needed for nausea or vomiting. (Patient not taking: Reported on 08/02/2020)  . tamsulosin (FLOMAX) 0.4 MG CAPS capsule Take 1 capsule (0.4 mg total) by mouth daily after supper. (Patient not taking: Reported on 08/02/2020)  . traMADol (ULTRAM) 50 MG tablet Take 1 tablet (50 mg total) by mouth every 8 (eight) hours as needed for moderate pain.  Marland Kitchen zolpidem (AMBIEN) 10 MG tablet Take 1 tablet (10 mg total) by mouth at bedtime as needed for sleep.  . [DISCONTINUED] Acetaminophen-Codeine 300-30 MG tablet Take by mouth.  . [DISCONTINUED] cephALEXin (KEFLEX) 500 MG capsule Take 500 mg by mouth 4 (four) times daily.  . [DISCONTINUED] fluconazole (DIFLUCAN) 150 MG tablet Take 1 tablet po once. May repeat dose in 3 days as needed for persistent symptoms. (Patient not taking: Reported on 08/02/2020)  . [DISCONTINUED] traMADol (ULTRAM) 50 MG tablet Take 1 tablet (50 mg total) by mouth every 8 (eight) hours as needed for moderate pain. (Patient not taking: Reported on 08/02/2020)   No facility-administered encounter medications on file as of 08/02/2020.   Time spent with the patient was approximately 45 minutes. This time included reviewing progress notes, labs, imaging studies, and discussing plan for follow up.   Follow-up: Return in about 3 months (around 11/01/2020) for DM, HTN, HLD with FBW same day.   Ronnell Freshwater, NP

## 2020-08-05 ENCOUNTER — Telehealth: Payer: Self-pay | Admitting: Nurse Practitioner

## 2020-08-05 NOTE — Progress Notes (Signed)
  Chronic Care Management   Outreach Note  08/05/2020 Name: Regina Deleon MRN: [id number] DOB: May 10, 1950  Referred by: Ronnell Freshwater, NP Reason for referral : No chief complaint on file.   An unsuccessful telephone outreach was attempted today. The patient was referred to the pharmacist for assistance with care management and care coordination.   Follow Up Plan:    Chronic Care Management   Outreach Note  08/05/2020 Name: Regina Deleon MRN: [id number] DOB: [birth year]  Referred by: Ronnell Freshwater, NP Reason for referral : No chief complaint on file.   An unsuccessful telephone outreach was attempted today. The patient was referred to the pharmacist for assistance with care management and care coordination.   Follow Up Plan:   Carley Perdue [employer] Scheduler

## 2020-08-06 ENCOUNTER — Other Ambulatory Visit: Payer: Self-pay | Admitting: Hospice and Palliative Medicine

## 2020-08-06 DIAGNOSIS — M170 Bilateral primary osteoarthritis of knee: Secondary | ICD-10-CM

## 2020-08-11 DIAGNOSIS — K011 Impacted teeth: Secondary | ICD-10-CM | POA: Insufficient documentation

## 2020-08-11 DIAGNOSIS — B3731 Acute candidiasis of vulva and vagina: Secondary | ICD-10-CM | POA: Insufficient documentation

## 2020-08-11 DIAGNOSIS — Z7689 Persons encountering health services in other specified circumstances: Secondary | ICD-10-CM | POA: Insufficient documentation

## 2020-08-11 DIAGNOSIS — B373 Candidiasis of vulva and vagina: Secondary | ICD-10-CM | POA: Insufficient documentation

## 2020-08-13 IMAGING — CT CT CHEST W/O CM
2 of 4 series · 15 of 36 positions shown, 18 images · non-contrast
Comparison: CT stone study 07/11/2019

CLINICAL DATA: Pulmonary nodules.

EXAM:
CT CHEST WITHOUT CONTRAST
TECHNIQUE: Multidetector CT imaging of the chest was performed following the
standard protocol without IV contrast.

[Series 2: chest 2.00 · axial · 0.61mm/px · z∈[-1121,-913]mm · 12 of 124 slices shown, 15 images]
[im 10/124  mediastinal]
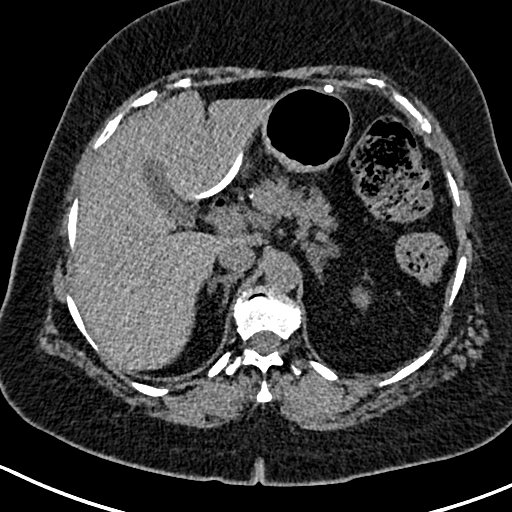
[im 10/124  lung]
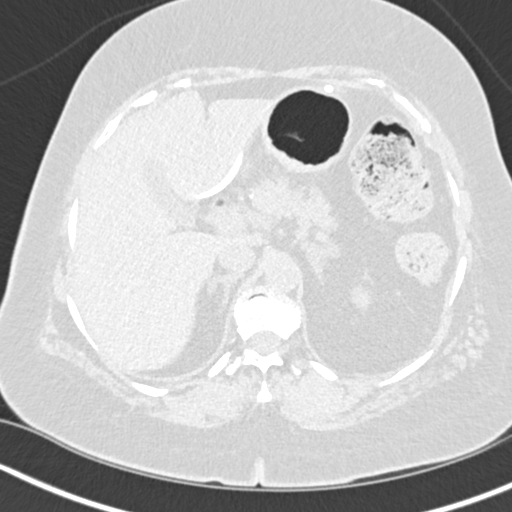
[im 19/124  lung]
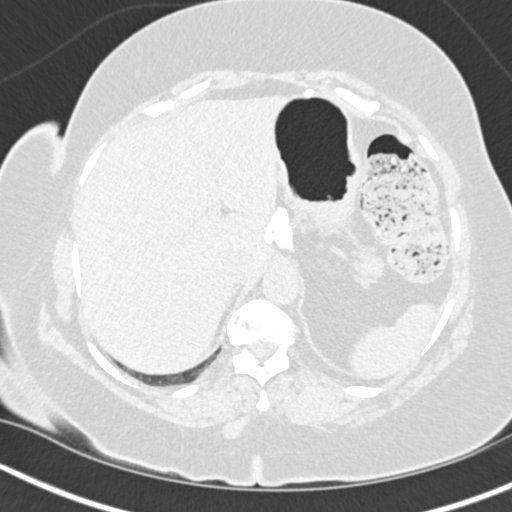
[im 29/124  lung]
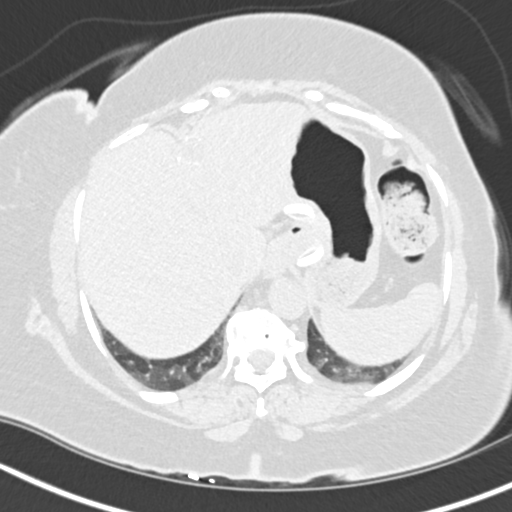
[im 38/124  lung]
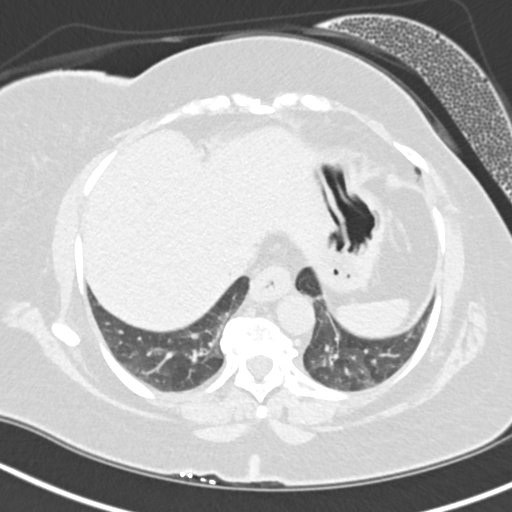
[im 48/124  mediastinal]
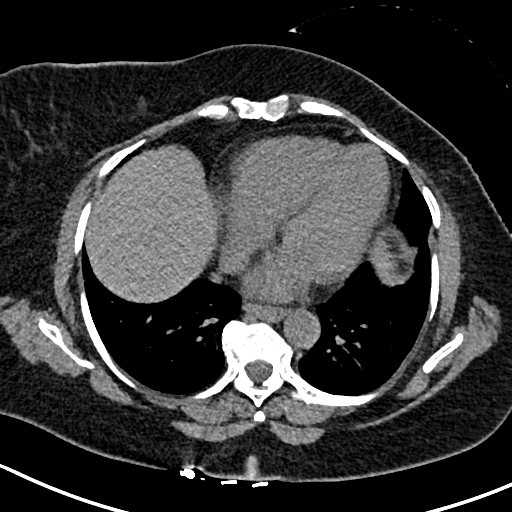
[im 48/124  lung]
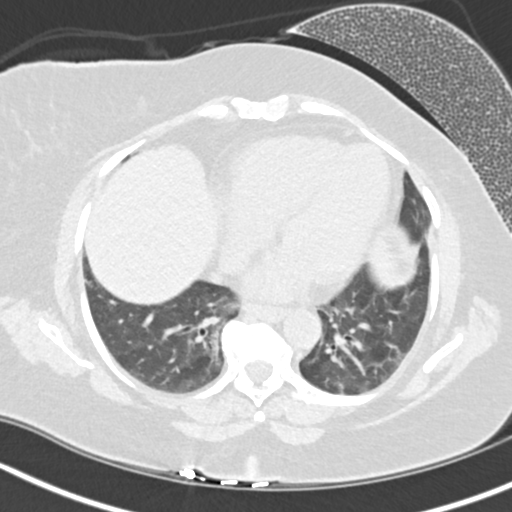
[im 57/124  lung]
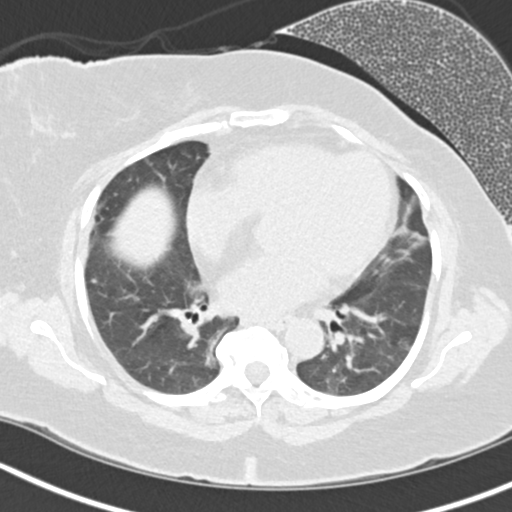
[im 67/124  lung]
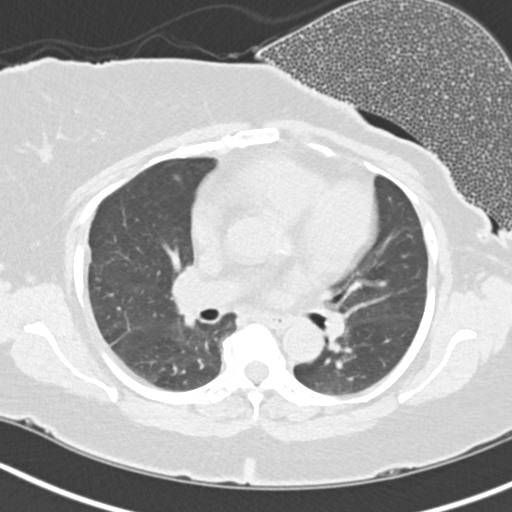
[im 76/124  lung]
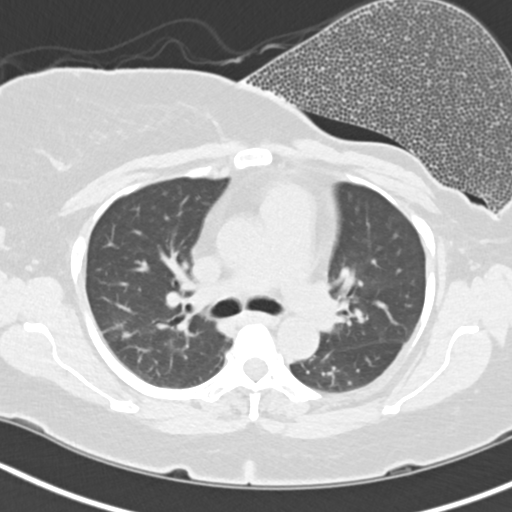
[im 86/124  mediastinal]
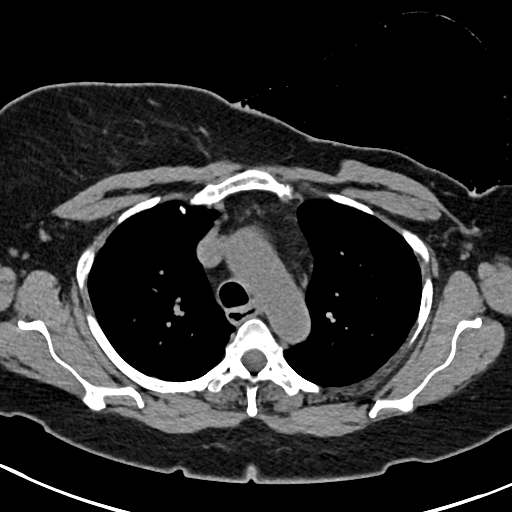
[im 86/124  lung]
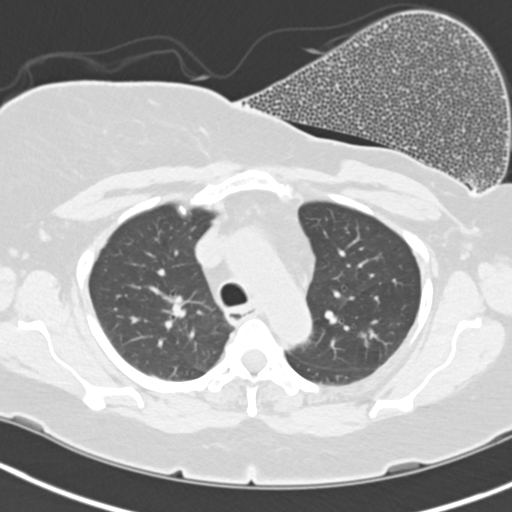
[im 95/124  lung]
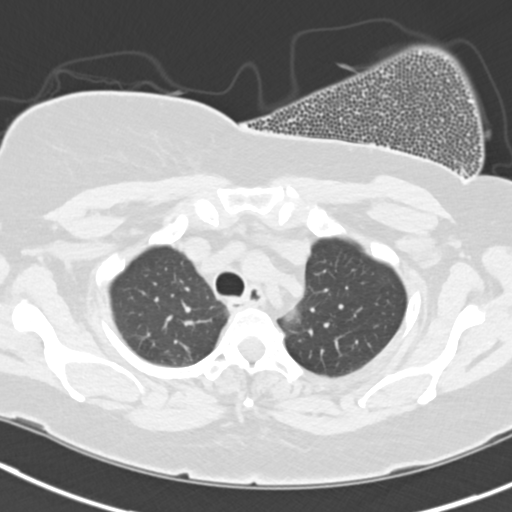
[im 105/124  lung]
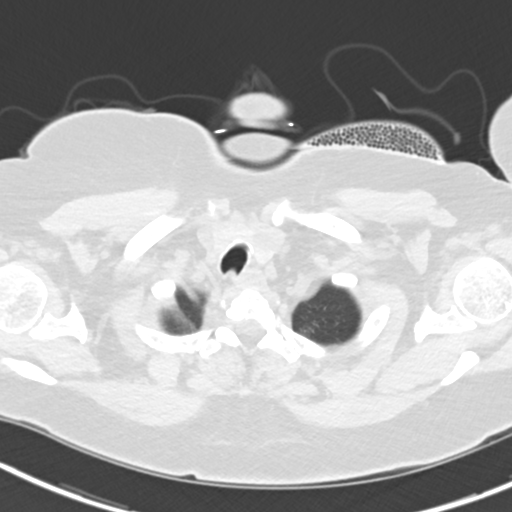
[im 114/124  lung]
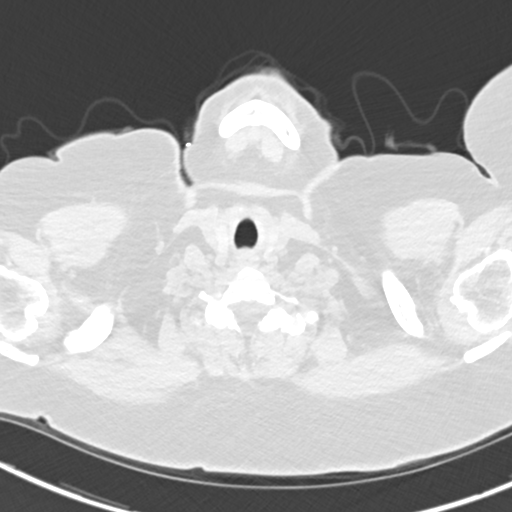

[Series 5: coronals chest 2.00 cor · coronal · 0.49mm/px · 3 of 133 slices shown]
[im 27/133  lung]
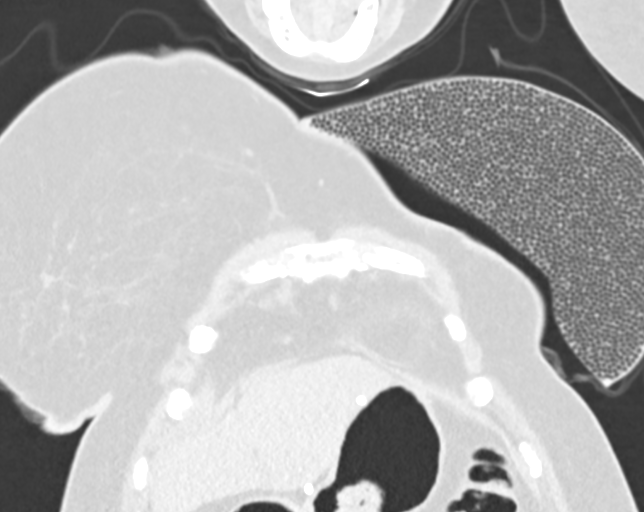
[im 53/133  lung]
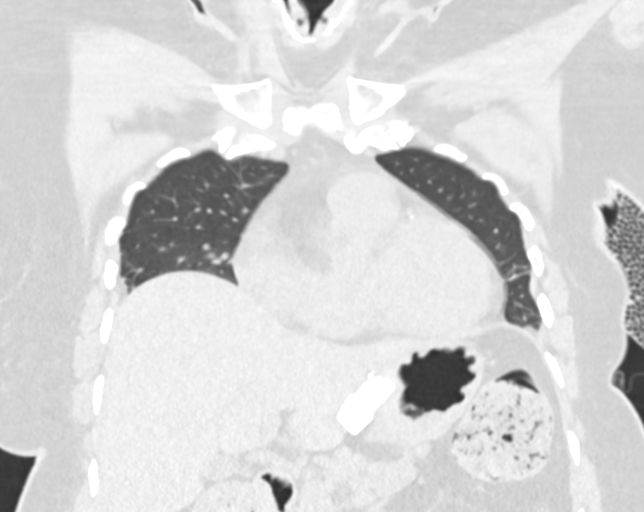
[im 80/133  lung]
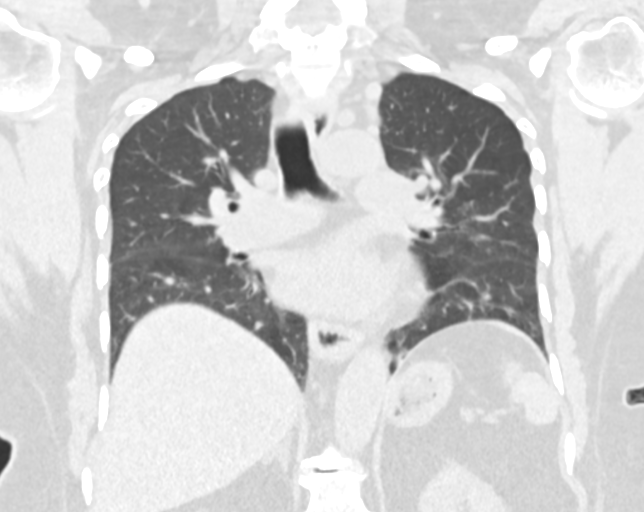

[15 of 36 positions shown; findings below may reference images not displayed]

FINDINGS: Cardiovascular: The heart size is normal. No substantial pericardial
effusion. Coronary artery calcification is evident. Atherosclerotic
calcification is noted in the wall of the thoracic aorta.

Mediastinum/Nodes: 17 mm left thyroid nodule evident. No mediastinal
lymphadenopathy. No evidence for gross hilar lymphadenopathy
although assessment is limited by the lack of intravenous contrast
on today's study. The esophagus has normal imaging features.
Surgical clips noted left axilla. There is no axillary
lymphadenopathy.

Lungs/Pleura: Bilobed versus 2 adjacent nodules in the posterior
right upper lobe, measuring 1.4 x 1.0 cm in [place] on image 51/3.
Dominant nodular component to this lesion measures 1.0 cm maximum
diameter. 4 mm medial right lower lobe nodule visible on 80/3 with
another 6 mm nodule seen in the right base posteriorly on 88/3.

5 mm posterior left upper lobe nodule identified on 40/3. 9 mm left
lower lobe nodule identified on 74/3. 5 mm lingular nodule visible
on 61/3.

Additional scattered bilateral pulmonary nodules evident.

Upper Abdomen: 18 mm rim calcified lesion in the left liver is
stable and likely a cyst. Lap band again noted. Bilateral adrenal
thickening noted with nodular configuration centrally in the left
adrenal gland, stable with low attenuation suggesting adenoma.

Musculoskeletal: No worrisome lytic or sclerotic osseous
abnormality. Left mastectomy.
IMPRESSION: 1. Bilateral pulmonary nodules measuring up to 10 mm in the right
upper lobe and 9 mm in the left lower lobe. Given the history of
breast cancer, metastatic disease is a distinct concern. Nodules are
borderline for reliable resolution on PET imaging. Close follow-up
recommended.
2. 1.7 cm left thyroid nodule. Recommend thyroid US. (Ref: [HOSPITAL]. [DATE]): 143-50).
3.  Aortic Atherosclerois ([id number]-170.0)

## 2020-09-09 ENCOUNTER — Telehealth: Payer: Self-pay | Admitting: Nurse Practitioner

## 2020-09-09 DIAGNOSIS — Z79899 Other long term (current) drug therapy: Secondary | ICD-10-CM | POA: Diagnosis not present

## 2020-09-09 DIAGNOSIS — Z880 Allergy status to penicillin: Secondary | ICD-10-CM | POA: Diagnosis not present

## 2020-09-09 DIAGNOSIS — J069 Acute upper respiratory infection, unspecified: Secondary | ICD-10-CM | POA: Diagnosis not present

## 2020-09-09 DIAGNOSIS — R0600 Dyspnea, unspecified: Secondary | ICD-10-CM | POA: Diagnosis not present

## 2020-09-09 DIAGNOSIS — Z886 Allergy status to analgesic agent status: Secondary | ICD-10-CM | POA: Diagnosis not present

## 2020-09-09 DIAGNOSIS — Z6836 Body mass index (BMI) 36.0-36.9, adult: Secondary | ICD-10-CM | POA: Diagnosis not present

## 2020-09-09 DIAGNOSIS — R093 Abnormal sputum: Secondary | ICD-10-CM | POA: Diagnosis not present

## 2020-09-09 DIAGNOSIS — R0602 Shortness of breath: Secondary | ICD-10-CM | POA: Diagnosis not present

## 2020-09-09 DIAGNOSIS — I10 Essential (primary) hypertension: Secondary | ICD-10-CM | POA: Diagnosis not present

## 2020-09-09 DIAGNOSIS — H9202 Otalgia, left ear: Secondary | ICD-10-CM | POA: Diagnosis not present

## 2020-09-09 DIAGNOSIS — E7800 Pure hypercholesterolemia, unspecified: Secondary | ICD-10-CM | POA: Diagnosis not present

## 2020-09-09 DIAGNOSIS — R6884 Jaw pain: Secondary | ICD-10-CM | POA: Diagnosis not present

## 2020-09-09 DIAGNOSIS — Z7982 Long term (current) use of aspirin: Secondary | ICD-10-CM | POA: Diagnosis not present

## 2020-09-09 DIAGNOSIS — Z91040 Latex allergy status: Secondary | ICD-10-CM | POA: Diagnosis not present

## 2020-09-09 DIAGNOSIS — Z888 Allergy status to other drugs, medicaments and biological substances status: Secondary | ICD-10-CM | POA: Diagnosis not present

## 2020-09-09 DIAGNOSIS — Z20822 Contact with and (suspected) exposure to covid-19: Secondary | ICD-10-CM | POA: Diagnosis not present

## 2020-09-09 NOTE — Telephone Encounter (Signed)
Patient calling office stating she is having SOB and difficulty breathing. States she feels very congested in her chest and is having a hard time taking breaths. Patient is at home with her spouse. I advised them to call 911 and have EMS take patient to ED. Patient and spouse verbalized understanding and were agreeable. AS, CMA

## 2020-09-09 NOTE — Telephone Encounter (Signed)
Patient is coughing and having mucus and is having shortness of breath. Please advise, thanks.

## 2020-10-16 DIAGNOSIS — H9202 Otalgia, left ear: Secondary | ICD-10-CM | POA: Diagnosis not present

## 2020-10-16 DIAGNOSIS — J301 Allergic rhinitis due to pollen: Secondary | ICD-10-CM | POA: Diagnosis not present

## 2020-10-16 DIAGNOSIS — J342 Deviated nasal septum: Secondary | ICD-10-CM | POA: Diagnosis not present

## 2020-10-16 DIAGNOSIS — R519 Headache, unspecified: Secondary | ICD-10-CM | POA: Diagnosis not present

## 2020-10-21 ENCOUNTER — Other Ambulatory Visit: Payer: Self-pay | Admitting: Nurse Practitioner

## 2020-10-21 ENCOUNTER — Other Ambulatory Visit: Payer: Self-pay | Admitting: Internal Medicine

## 2020-10-21 DIAGNOSIS — J069 Acute upper respiratory infection, unspecified: Secondary | ICD-10-CM

## 2020-10-21 DIAGNOSIS — I10 Essential (primary) hypertension: Secondary | ICD-10-CM

## 2020-10-29 ENCOUNTER — Other Ambulatory Visit: Payer: Self-pay | Admitting: Nurse Practitioner

## 2020-10-29 DIAGNOSIS — F5101 Primary insomnia: Secondary | ICD-10-CM

## 2020-10-30 NOTE — Telephone Encounter (Signed)
30 day refill sent to pharmacy. Patient has follow up appointment 11/05/2020

## 2020-11-05 ENCOUNTER — Encounter: Payer: Self-pay | Admitting: Nurse Practitioner

## 2020-11-05 ENCOUNTER — Ambulatory Visit (INDEPENDENT_AMBULATORY_CARE_PROVIDER_SITE_OTHER): Payer: PPO | Admitting: Nurse Practitioner

## 2020-11-05 ENCOUNTER — Other Ambulatory Visit: Payer: Self-pay

## 2020-11-05 VITALS — BP 120/77 | HR 82 | Temp 97.7°F | Ht 63.0 in | Wt 200.7 lb

## 2020-11-05 DIAGNOSIS — R42 Dizziness and giddiness: Secondary | ICD-10-CM

## 2020-11-05 DIAGNOSIS — I10 Essential (primary) hypertension: Secondary | ICD-10-CM

## 2020-11-05 DIAGNOSIS — R5383 Other fatigue: Secondary | ICD-10-CM

## 2020-11-05 DIAGNOSIS — F5101 Primary insomnia: Secondary | ICD-10-CM | POA: Diagnosis not present

## 2020-11-05 DIAGNOSIS — R7301 Impaired fasting glucose: Secondary | ICD-10-CM | POA: Diagnosis not present

## 2020-11-05 DIAGNOSIS — Z1231 Encounter for screening mammogram for malignant neoplasm of breast: Secondary | ICD-10-CM | POA: Diagnosis not present

## 2020-11-05 DIAGNOSIS — E785 Hyperlipidemia, unspecified: Secondary | ICD-10-CM

## 2020-11-05 DIAGNOSIS — Z862 Personal history of diseases of the blood and blood-forming organs and certain disorders involving the immune mechanism: Secondary | ICD-10-CM | POA: Diagnosis not present

## 2020-11-05 DIAGNOSIS — R252 Cramp and spasm: Secondary | ICD-10-CM

## 2020-11-05 DIAGNOSIS — M5431 Sciatica, right side: Secondary | ICD-10-CM

## 2020-11-05 MED ORDER — ROSUVASTATIN CALCIUM 5 MG PO TABS
5.0000 mg | ORAL_TABLET | ORAL | 1 refills | Status: DC
Start: 2020-11-06 — End: 2021-04-09

## 2020-11-05 MED ORDER — TRAMADOL HCL 50 MG PO TABS: 50.0000 mg | ORAL_TABLET | Freq: Three times a day (TID) | ORAL | 0 refills | Status: DC | PRN

## 2020-11-05 MED ORDER — ZOLPIDEM TARTRATE 10 MG PO TABS: 10.0000 mg | ORAL_TABLET | Freq: Every evening | ORAL | 3 refills | Status: DC | PRN

## 2020-11-05 MED ORDER — CYCLOBENZAPRINE HCL 10 MG PO TABS: 10.0000 mg | ORAL_TABLET | Freq: Every evening | ORAL | 3 refills | Status: DC | PRN

## 2020-11-05 NOTE — Progress Notes (Signed)
Established Patient Office Visit  Subjective:  Patient ID: Regina Deleon, female    DOB: Jun 04, 1950  Age: 70 y.o. MRN: [id number]  CC:  Chief Complaint  Patient presents with   Follow-up   Diabetes   Hyperlipidemia    HPI Regina Deleon presents for follow-up visit.  Today she is feeling very fatigued.  She has noticed increased sweating.  Gets periods of dizziness with position changes.  States things been going on for the last week or so.  Concerned she is dehydrated.  She denies chest pain, chest pressure, shortness of breath, or headaches.  Denies nausea, vomiting, or diarrhea. States that she is having right knee pain due to neuropathy.  She states that Flexeril does work better than Neurontin.  This is especially true at night.  Has been ongoing problem since she had chemotherapy..  No injury or trauma to the knee.  She is able to walk without difficulty. He does have trouble sleeping which has been going on since she had treatment for breast cancer.  She has been taking Ambien 10 mg once at night.  She states that her insurance has approved her to have 30 days at a time. She has no other concerns or complaints.  Does state has refills of some of her routine medications.  Past Medical History:  Diagnosis Date   Cancer ([place])    lt br cancer   Edema leg    with chemo   GERD (gastroesophageal reflux disease)    also, diverticulosis   History of kidney stones    Hyperlipidemia    Hypertension    Neuropathy    hands and feet post chemo   Wears glasses     Past Surgical History:  Procedure Laterality Date   ABDOMINAL HYSTERECTOMY  1986   AXILLARY LYMPH NODE DISSECTION Left 06/26/2014   Procedure: AXILLARY LYMPH NODE DISSECTION;  Surgeon: Alphonsa Overall, MD;  Location: [facility];  Service: General;  Laterality: Left;   [place]   lumbar   BREAST SURGERY Left 06/2014   CATARACT EXTRACTION W/PHACO Right 03/23/2019   Procedure: CATARACT  EXTRACTION PHACO AND INTRAOCULAR LENS PLACEMENT ([place]) RIGHT VISION BLUE;  Surgeon: Marchia Meiers, MD;  Location: [facility];  Service: Ophthalmology;  Laterality: Right;  [place] 00:55.4 CDE 5.92 Fluid Pack lot # [id number]   COLONOSCOPY     COLONOSCOPY WITH PROPOFOL N/A 12/29/2017   Procedure: COLONOSCOPY WITH PROPOFOL;  Surgeon: Toledo, Benay Pike, MD;  Location: [facility];  Service: Gastroenterology;  Laterality: N/A;   CYSTOSCOPY/URETEROSCOPY/HOLMIUM LASER/STENT PLACEMENT Bilateral 07/28/2019   Procedure: CYSTOSCOPY/URETEROSCOPY/HOLMIUM LASER/STENT PLACEMENT;  Surgeon: Billey Co, MD;  Location: [facility];  Service: Urology;  Laterality: Bilateral;   DENTAL SURGERY Right    top    DILATION AND CURETTAGE OF UTERUS     LAPAROSCOPIC GASTRIC BANDING  10/19/05   MASTECTOMY Left 2016   MASTECTOMY MODIFIED RADICAL Left 06/26/2014   Procedure: MASTECTOMY MODIFIED RADICAL;  Surgeon: Alphonsa Overall, MD;  Location: [facility];  Service: General;  Laterality: Left;   PORT A CATH REVISION     insertion-10/15   PORT-A-CATH REMOVAL N/A 02/07/2015   Procedure: MINOR REMOVAL PORT-A-CATH;  Surgeon: Alphonsa Overall, MD;  Location: [facility];  Service: General;  Laterality: N/A;    Family History  Problem Relation Age of Onset   Asthma Mother    Diabetes Mother    Diabetes Father    Heart disease Father  Cancer Maternal Aunt    Cancer Cousin    Cancer Cousin    Cancer Cousin     Social History   Socioeconomic History   Marital status: Married    Spouse name: Lake Bells   Number of children: Not on file   Years of education: Not on file   Highest education level: Not on file  Occupational History   Not on file  Tobacco Use   Smoking status: Never   Smokeless tobacco: Never  Vaping Use   Vaping Use: Never used  Substance and Sexual Activity   Alcohol use: No   Drug use: No   Sexual activity: Yes  Other Topics Concern   Not on file  Social History Narrative    Not on file   Social Determinants of Health   Financial Resource Strain: Not on file  Food Insecurity: Not on file  Transportation Needs: Not on file  Physical Activity: Not on file  Stress: Not on file  Social Connections: Not on file  Intimate Partner Violence: Not on file    Outpatient Medications Prior to Visit  Medication Sig Dispense Refill   acetaminophen (TYLENOL) 500 MG tablet Take 1,000 mg by mouth every 6 (six) hours as needed for mild pain.      aspirin 81 MG tablet Take 81 mg by mouth daily.      cetirizine (ZYRTEC) 10 MG tablet Take 10 mg by mouth daily as needed for allergies.     Cholecalciferol (VITAMIN D-3) 125 MCG (5000 UT) TABS Take 5,000 mg by mouth daily.     diclofenac (VOLTAREN) 50 MG EC tablet TAKE 1 TABLET (50 MG TOTAL) BY MOUTH 2 (TWO) TIMES DAILY AS NEEDED FOR MODERATE PAIN. 60 tablet 0   diclofenac Sodium (VOLTAREN) 1 % GEL Apply 4 g topically 4 (four) times daily as needed. 100 g 5   docusate sodium (COLACE) 100 MG capsule Take 200 mg by mouth daily.     dorzolamide-timolol (COSOPT) 22.3-6.8 MG/ML ophthalmic solution Place 1 drop into both eyes 2 (two) times daily.     fluticasone (FLONASE) 50 MCG/ACT nasal spray SPRAY 2 SPRAYS INTO EACH NOSTRIL EVERY DAY 48 mL 2   latanoprost (XALATAN) 0.005 % ophthalmic solution Place 1 drop into both eyes at bedtime.     NIFEdipine (PROCARDIA-XL/NIFEDICAL-XL) 30 MG 24 hr tablet TAKE 1 TABLET BY MOUTH 2 TIMES DAILY. 180 tablet 0   Omega 3 1000 MG CAPS Take 1 capsule (1,000 mg total) by mouth daily. 60 capsule 0   omega-3 acid ethyl esters (LOVAZA) 1 g capsule TAKE 1 CAPSULE (1,000 MG TOTAL) BY MOUTH DAILY. 60 capsule 2   omeprazole (PRILOSEC OTC) 20 MG tablet Take 20 mg by mouth daily as needed (acid reflux).     ondansetron (ZOFRAN) 4 MG tablet Take 1 tablet (4 mg total) by mouth every 8 (eight) hours as needed for nausea or vomiting. 8 tablet 0   tamsulosin (FLOMAX) 0.4 MG CAPS capsule Take 1 capsule (0.4 mg total) by  mouth daily after supper. 7 capsule 0   triamterene-hydrochlorothiazide (MAXZIDE-25) 37.5-25 MG tablet Take 1 tablet by mouth daily. 90 tablet 3   fluconazole (DIFLUCAN) 150 MG tablet Take 1 tablet po once. May repeat dose in 3 days as needed for persistent symptoms. 5 tablet 0   gabapentin (NEURONTIN) 100 MG capsule Take 1 capsule po qam and two capsules po QPM 90 capsule 3   rosuvastatin (CRESTOR) 5 MG tablet TAKE 1 TABLET (5 MG TOTAL)  BY MOUTH 3 (THREE) TIMES A WEEK. 30 tablet 1   traMADol (ULTRAM) 50 MG tablet Take 1 tablet (50 mg total) by mouth every 8 (eight) hours as needed for moderate pain. 15 tablet 0   zolpidem (AMBIEN) 10 MG tablet TAKE 1 TABLET BY MOUTH AT BEDTIME AS NEEDED FOR SLEEP. 30 tablet 0   No facility-administered medications prior to visit.    Allergies  Allergen Reactions   Darvocet [Propoxyphene N-Acetaminophen] Shortness Of Breath and Swelling   Latex Shortness Of Breath, Swelling and Anaphylaxis   Penicillins     Did it involve swelling of the face/tongue/throat, SOB, or low BP? Unknown Did it involve sudden or severe rash/hives, skin peeling, or any reaction on the inside of your mouth or nose? Unknown Did you need to seek medical attention at a hospital or doctor's office? Unknown When did it last happen?      Childhood allergy  If all above answers are "NO", may proceed with cephalosporin use.  Has tolerated amoxicillin     Cephalexin Itching   Enalapril Cough     Vasotec    ROS Review of Systems  Constitutional:  Positive for fatigue. Negative for activity change, chills and fever.  HENT:  Negative for congestion, postnasal drip, rhinorrhea, sinus pressure and sinus pain.   Eyes: Negative.   Respiratory:  Negative for cough, chest tightness, shortness of breath and wheezing.   Cardiovascular:  Negative for chest pain and palpitations.  Gastrointestinal:  Negative for constipation, diarrhea, nausea and vomiting.  Endocrine: Negative for cold  intolerance, heat intolerance, polydipsia and polyuria.  Genitourinary:  Negative for dysuria.  Musculoskeletal:  Positive for arthralgias and myalgias. Negative for back pain.       Right knee and leg pain.  Skin:  Negative for rash.  Allergic/Immunologic: Negative.   Neurological:  Negative for dizziness, weakness and headaches.  Psychiatric/Behavioral:  Positive for sleep disturbance. The patient is not nervous/anxious.      Objective:    Physical Exam Vitals and nursing note reviewed.  Constitutional:      Appearance: Normal appearance. She is well-developed. She is obese.  HENT:     Head: Normocephalic and atraumatic.     Nose: Nose normal.     Mouth/Throat:     Mouth: Mucous membranes are moist.  Eyes:     Extraocular Movements: Extraocular movements intact.     Conjunctiva/sclera: Conjunctivae normal.     Pupils: Pupils are equal, round, and reactive to light.  Neck:     Vascular: No carotid bruit.  Cardiovascular:     Rate and Rhythm: Normal rate and regular rhythm.     Pulses: Normal pulses.     Heart sounds: Normal heart sounds.  Pulmonary:     Effort: Pulmonary effort is normal.     Breath sounds: Normal breath sounds.  Abdominal:     Palpations: Abdomen is soft.  Musculoskeletal:        General: Normal range of motion.     Cervical back: Normal range of motion and neck supple.     Comments: Mild tenderness of the right knee.  No swelling or abnormality present.  Range of motion and strength are intact.  Lymphadenopathy:     Cervical: No cervical adenopathy.  Skin:    General: Skin is warm and dry.     Capillary Refill: Capillary refill takes less than 2 seconds.  Neurological:     General: No focal deficit present.     Mental Status:  She is alert and oriented to person, place, and time.     Cranial Nerves: No cranial nerve deficit.     Sensory: No sensory deficit.     Motor: No weakness.     Coordination: Coordination normal.  Psychiatric:        Mood  and Affect: Mood normal.        Behavior: Behavior normal.        Thought Content: Thought content normal.        Judgment: Judgment normal.    Today's Vitals   11/05/20 0915  BP: 120/77  Pulse: 82  Temp: 97.7 F (36.5 C)  SpO2: 98%  Weight: 200 lb 11.2 oz (91 kg)  Height: 5' 3"  (1.6 m)   Body mass index is 35.55 kg/m.   Wt Readings from Last 3 Encounters:  11/05/20 200 lb 11.2 oz (91 kg)  08/02/20 206 lb 1.6 oz (93.5 kg)  04/22/20 199 lb 6.4 oz (90.4 kg)     Health Maintenance Due  Topic Date Due   TETANUS/TDAP  Never done   Zoster Vaccines- Shingrix (1 of 2) Never done   COVID-19 Vaccine (4 - Booster for Moderna series) 07/05/2020    There are no preventive care reminders to display for this patient.  Lab Results  Component Value Date   TSH 0.572 11/05/2020   Lab Results  Component Value Date   WBC 7.7 11/05/2020   HGB 11.7 11/05/2020   HCT 35.1 11/05/2020   MCV 82 11/05/2020   PLT 151 03/18/2020   Lab Results  Component Value Date   NA 141 11/08/2020   K 3.7 11/08/2020   CHLORIDE 107 01/31/2015   CO2 24 11/08/2020   GLUCOSE 86 11/08/2020   BUN 11 11/08/2020   CREATININE 0.61 11/08/2020   BILITOT 0.3 11/08/2020   ALKPHOS 94 11/08/2020   AST 17 11/08/2020   ALT 11 11/08/2020   PROT 7.4 11/08/2020   ALBUMIN 4.4 11/08/2020   CALCIUM 10.4 (H) 11/08/2020   ANIONGAP 11 07/28/2019   EGFR 96 11/08/2020   Lab Results  Component Value Date   CHOL 271 (H) 03/18/2020   Lab Results  Component Value Date   HDL 65 03/18/2020   Lab Results  Component Value Date   LDLCALC 186 (H) 03/18/2020   Lab Results  Component Value Date   TRIG 114 03/18/2020   Lab Results  Component Value Date   CHOLHDL 4.2 01/10/2015   Lab Results  Component Value Date   HGBA1C 5.9 (H) 11/05/2020      Assessment & Plan:  1. Essential hypertension Blood pressure stable.  Continue medication as prescribed.  2. Hyperlipidemia, unspecified hyperlipidemia type Renew  Crestor.  Continue to take daily at dinnertime. - rosuvastatin (CRESTOR) 5 MG tablet; Take 1 tablet (5 mg total) by mouth 3 (three) times a week.  Dispense: 30 tablet; Refill: 1  3. Dizziness Check labs during today's visit.  Including thyroid panel, iron panel, B12, and hemoglobin A1c. - CBC With Differential - Comprehensive metabolic panel - TSH + free T4 - Iron, TIBC and Ferritin Panel - Vitamin B12 - Hemoglobin A1c  4. Other fatigue Check labs at today's visit.  Will discuss results with patient's when they are available. - CBC With Differential - Comprehensive metabolic panel - TSH + free T4 - Iron, TIBC and Ferritin Panel - Vitamin B12 - Hemoglobin A1c  5. History of iron deficiency anemia Check iron panel with B12 with today's labs. - Iron, TIBC  and Ferritin Panel - Vitamin B12  6. Impaired fasting glucose Check hemoglobin A1c. - Hemoglobin A1c  7. Right sided sciatica Short-term prescription for tramadol 50 mg tablets.  Take up to 3 times a day as needed for pain.  Prescription for #15tablets sent to her pharmacy today.Reviewed risks and possible side effects associated with taking opiates, benzodiazepines and other CNS depressants. Combination of these could cause dizziness and drowsiness. Advised patient not to drive or operate machinery when taking these medications, as patient's and other's life can be at risk and will have consequences. Patient verbalized understanding in this matter.   - traMADol (ULTRAM) 50 MG tablet; Take 1 tablet (50 mg total) by mouth every 8 (eight) hours as needed for moderate pain.  Dispense: 15 tablet; Refill: 0  8. Leg cramps May take Flexeril 10 mg tablets in the evenings to help with leg cramps and leg pain. - cyclobenzaprine (FLEXERIL) 10 MG tablet; Take 1 tablet (10 mg total) by mouth at bedtime as needed for muscle spasms.  Dispense: 30 tablet; Refill: 3  9. Primary insomnia Patient may take Ambien 10 mg tablets at bedtime as needed  for insomnia.  New prescription sent to her pharmacy today. - zolpidem (AMBIEN) 10 MG tablet; Take 1 tablet (10 mg total) by mouth at bedtime as needed. for sleep  Dispense: 30 tablet; Refill: 3  10. Encounter for screening mammogram for malignant neoplasm of breast Screening mammogram ordered today. - MM DIGITAL SCREENING BILATERAL; Future   Problem List Items Addressed This Visit       Cardiovascular and Mediastinum   Essential hypertension - Primary   Relevant Medications   rosuvastatin (CRESTOR) 5 MG tablet     Endocrine   Impaired fasting glucose   Relevant Orders   Hemoglobin A1c (Completed)     Nervous and Auditory   Right sided sciatica   Relevant Medications   zolpidem (AMBIEN) 10 MG tablet   traMADol (ULTRAM) 50 MG tablet   cyclobenzaprine (FLEXERIL) 10 MG tablet     Other   Primary insomnia   Relevant Medications   zolpidem (AMBIEN) 10 MG tablet   Hyperlipidemia   Relevant Medications   rosuvastatin (CRESTOR) 5 MG tablet   Dizziness   Relevant Orders   CBC With Differential (Completed)   Comprehensive metabolic panel (Completed)   TSH + free T4 (Completed)   Iron, TIBC and Ferritin Panel (Completed)   Vitamin B12 (Completed)   Hemoglobin A1c (Completed)   Other fatigue   Relevant Orders   CBC With Differential (Completed)   Comprehensive metabolic panel (Completed)   TSH + free T4 (Completed)   Iron, TIBC and Ferritin Panel (Completed)   Vitamin B12 (Completed)   Hemoglobin A1c (Completed)   History of iron deficiency anemia   Relevant Orders   Iron, TIBC and Ferritin Panel (Completed)   Vitamin B12 (Completed)   Leg cramps   Relevant Medications   cyclobenzaprine (FLEXERIL) 10 MG tablet   Encounter for screening mammogram for malignant neoplasm of breast   Relevant Orders   MM DIGITAL SCREENING BILATERAL    Meds ordered this encounter  Medications   zolpidem (AMBIEN) 10 MG tablet    Sig: Take 1 tablet (10 mg total) by mouth at bedtime as  needed. for sleep    Dispense:  30 tablet    Refill:  3    Order Specific Question:   Supervising Provider    Answer:   Beatrice Lecher D [[id number]]   traMADol Veatrice Bourbon)  50 MG tablet    Sig: Take 1 tablet (50 mg total) by mouth every 8 (eight) hours as needed for moderate pain.    Dispense:  15 tablet    Refill:  0    Please d/c rx for Tylenol #3    Order Specific Question:   Supervising Provider    Answer:   Beatrice Lecher D [[id number]]   rosuvastatin (CRESTOR) 5 MG tablet    Sig: Take 1 tablet (5 mg total) by mouth 3 (three) times a week.    Dispense:  30 tablet    Refill:  1    Order Specific Question:   Supervising Provider    Answer:   Beatrice Lecher D [[id number]]   cyclobenzaprine (FLEXERIL) 10 MG tablet    Sig: Take 1 tablet (10 mg total) by mouth at bedtime as needed for muscle spasms.    Dispense:  30 tablet    Refill:  3    Order Specific Question:   Supervising Provider    Answer:   Beatrice Lecher D [[id number]]    Follow-up: Return in about 4 months (around 03/08/2021) for [place] - will need fasting blood ork at time of vist .    Ronnell Freshwater, NP

## 2020-11-06 LAB — HEMOGLOBIN A1C
Est. average glucose Bld gHb Est-mCnc: 123 mg/dL
Hgb A1c MFr Bld: 5.9 % — ABNORMAL HIGH (ref 4.8–5.6)

## 2020-11-06 LAB — COMPREHENSIVE METABOLIC PANEL
ALT: 14 IU/L (ref 0–32)
AST: 17 IU/L (ref 0–40)
Albumin/Globulin Ratio: 1.4 (ref 1.2–2.2)
Albumin: 4.4 g/dL (ref 3.8–4.8)
Alkaline Phosphatase: 101 IU/L (ref 44–121)
BUN/Creatinine Ratio: 19 (ref 12–28)
BUN: 15 mg/dL (ref 8–27)
Bilirubin Total: 0.3 mg/dL (ref 0.0–1.2)
CO2: 25 mmol/L (ref 20–29)
Calcium: 10.7 mg/dL — ABNORMAL HIGH (ref 8.7–10.3)
Chloride: 101 mmol/L (ref 96–106)
Creatinine, Ser: 0.77 mg/dL (ref 0.57–1.00)
Globulin, Total: 3.1 g/dL (ref 1.5–4.5)
Glucose: 91 mg/dL (ref 65–99)
Potassium: 3.6 mmol/L (ref 3.5–5.2)
Sodium: 140 mmol/L (ref 134–144)
Total Protein: 7.5 g/dL (ref 6.0–8.5)
eGFR: 83 mL/min/{1.73_m2} (ref >59)

## 2020-11-06 LAB — VITAMIN B12: Vitamin B-12: 911 pg/mL (ref 232–1245)

## 2020-11-06 LAB — CBC WITH DIFFERENTIAL
Basophils Absolute: 0.1 10*3/uL (ref 0.0–0.2)
Basos: 1 %
EOS (ABSOLUTE): 0.1 10*3/uL (ref 0.0–0.4)
Eos: 1 %
Hematocrit: 35.1 % (ref 34.0–46.6)
Hemoglobin: 11.7 g/dL (ref 11.1–15.9)
Immature Grans (Abs): 0 10*3/uL (ref 0.0–0.1)
Immature Granulocytes: 0 %
Lymphocytes Absolute: 2.3 10*3/uL (ref 0.7–3.1)
Lymphs: 30 %
MCH: 27.3 pg (ref 26.6–33.0)
MCHC: 33.3 g/dL (ref 31.5–35.7)
MCV: 82 fL (ref 79–97)
Monocytes Absolute: 0.5 10*3/uL (ref 0.1–0.9)
Monocytes: 7 %
Neutrophils Absolute: 4.7 10*3/uL (ref 1.4–7.0)
Neutrophils: 61 %
RBC: 4.28 x10E6/uL (ref 3.77–5.28)
RDW: 13.6 % (ref 11.7–15.4)
WBC: 7.7 10*3/uL (ref 3.4–10.8)

## 2020-11-06 LAB — TSH+FREE T4
Free T4: 1.24 ng/dL (ref 0.82–1.77)
TSH: 0.572 u[IU]/mL (ref 0.450–4.500)

## 2020-11-06 LAB — IRON,TIBC AND FERRITIN PANEL
Ferritin: 52 ng/mL (ref 15–150)
Iron Saturation: 11 % — ABNORMAL LOW (ref 15–55)
Iron: 41 ug/dL (ref 27–139)
Total Iron Binding Capacity: 372 ug/dL (ref 250–450)
UIBC: 331 ug/dL (ref 118–369)

## 2020-11-07 ENCOUNTER — Telehealth: Payer: Self-pay | Admitting: Nurse Practitioner

## 2020-11-07 NOTE — Progress Notes (Signed)
calcium level is elevated. This has been trending up, little by little with each lab check. I would like to have her come back to check intact PTH, repeat BMP, and ionized calcium level for further evaluation. She is not diabetic, but prediabetes is there. She needs to be very careful with consumption of foods high in carbohydrates and sugar. She should increase her activity level. Other labs looked good.

## 2020-11-07 NOTE — Telephone Encounter (Signed)
I just reviewed her labs, but haven't made a not about them yet. The only real abnormality is that her calcium level is elevated. This has been trending up, little by little with each lab check. I would like to have her come back to check intact PTH, repeat BMP, and ionized calcium level for further evaluation. She is not diabetic, but prediabetes is there. She needs to be very careful with consumption of foods high in carbohydrates and sugar. She should increase her activity level. Other labs looked good.

## 2020-11-07 NOTE — Telephone Encounter (Signed)
Patient called into office to get her lab results. Patient is anxious to get them.  I don't see where Nira Conn has reviewed them.  Please call her back as soon as possible.

## 2020-11-07 NOTE — Telephone Encounter (Signed)
Called patient to schedule a lab visit for tomorrow, 11/08/2020.

## 2020-11-08 ENCOUNTER — Other Ambulatory Visit: Payer: Self-pay

## 2020-11-08 ENCOUNTER — Other Ambulatory Visit: Payer: PPO

## 2020-11-08 DIAGNOSIS — R42 Dizziness and giddiness: Secondary | ICD-10-CM

## 2020-11-08 DIAGNOSIS — I10 Essential (primary) hypertension: Secondary | ICD-10-CM

## 2020-11-10 DIAGNOSIS — R252 Cramp and spasm: Secondary | ICD-10-CM | POA: Insufficient documentation

## 2020-11-10 DIAGNOSIS — Z1231 Encounter for screening mammogram for malignant neoplasm of breast: Secondary | ICD-10-CM | POA: Insufficient documentation

## 2020-11-10 DIAGNOSIS — R7301 Impaired fasting glucose: Secondary | ICD-10-CM | POA: Insufficient documentation

## 2020-11-10 DIAGNOSIS — Z862 Personal history of diseases of the blood and blood-forming organs and certain disorders involving the immune mechanism: Secondary | ICD-10-CM | POA: Insufficient documentation

## 2020-11-10 DIAGNOSIS — R5383 Other fatigue: Secondary | ICD-10-CM | POA: Insufficient documentation

## 2020-11-10 DIAGNOSIS — R42 Dizziness and giddiness: Secondary | ICD-10-CM | POA: Insufficient documentation

## 2020-11-10 LAB — COMPREHENSIVE METABOLIC PANEL
ALT: 11 IU/L (ref 0–32)
AST: 17 IU/L (ref 0–40)
Albumin/Globulin Ratio: 1.5 (ref 1.2–2.2)
Albumin: 4.4 g/dL (ref 3.8–4.8)
Alkaline Phosphatase: 94 IU/L (ref 44–121)
BUN/Creatinine Ratio: 18 (ref 12–28)
BUN: 11 mg/dL (ref 8–27)
Bilirubin Total: 0.3 mg/dL (ref 0.0–1.2)
CO2: 24 mmol/L (ref 20–29)
Calcium: 10.4 mg/dL — ABNORMAL HIGH (ref 8.7–10.3)
Chloride: 103 mmol/L (ref 96–106)
Creatinine, Ser: 0.61 mg/dL (ref 0.57–1.00)
Globulin, Total: 3 g/dL (ref 1.5–4.5)
Glucose: 86 mg/dL (ref 65–99)
Potassium: 3.7 mmol/L (ref 3.5–5.2)
Sodium: 141 mmol/L (ref 134–144)
Total Protein: 7.4 g/dL (ref 6.0–8.5)
eGFR: 96 mL/min/{1.73_m2} (ref >59)

## 2020-11-10 LAB — PTH, INTACT AND CALCIUM: PTH: 39 pg/mL (ref 15–65)

## 2020-11-10 LAB — CALCIUM, IONIZED: Calcium, Ion: 5.5 mg/dL (ref 4.5–5.6)

## 2020-11-11 ENCOUNTER — Telehealth: Payer: Self-pay | Admitting: Nurse Practitioner

## 2020-11-11 NOTE — Telephone Encounter (Signed)
Patient would like a call explaining her lab results from last week as she was trying to review them on Mychart, thanks.

## 2020-11-12 NOTE — Telephone Encounter (Signed)
Patient is aware of the results and verbalized understanding. AS< CMA

## 2020-11-12 NOTE — Progress Notes (Signed)
Ionized calcium and intact PTH are normal. Serum calcium improving. Will continue to monitor closely. Will likely redraw with each routine visit.

## 2020-11-12 NOTE — Telephone Encounter (Signed)
Please let her know that her labs indicate that there is no problem with parathyroid. Ionized calcium was normal and serum calcium is coming down to normal. We will continue to monitor this closely. Probably draw at each visit.  Thank you  -HB

## 2020-11-26 ENCOUNTER — Other Ambulatory Visit: Payer: Self-pay | Admitting: Internal Medicine

## 2020-11-26 DIAGNOSIS — I10 Essential (primary) hypertension: Secondary | ICD-10-CM

## 2020-12-12 DIAGNOSIS — H16212 Exposure keratoconjunctivitis, left eye: Secondary | ICD-10-CM | POA: Diagnosis not present

## 2020-12-27 DIAGNOSIS — M3501 Sicca syndrome with keratoconjunctivitis: Secondary | ICD-10-CM | POA: Diagnosis not present

## 2020-12-30 ENCOUNTER — Ambulatory Visit: Payer: PPO | Admitting: Hospice and Palliative Medicine

## 2021-01-02 ENCOUNTER — Other Ambulatory Visit: Payer: Self-pay | Admitting: Nurse Practitioner

## 2021-01-02 DIAGNOSIS — Z1231 Encounter for screening mammogram for malignant neoplasm of breast: Secondary | ICD-10-CM

## 2021-02-17 ENCOUNTER — Other Ambulatory Visit: Payer: Self-pay

## 2021-02-17 ENCOUNTER — Ambulatory Visit
Admission: RE | Admit: 2021-02-17 | Discharge: 2021-02-17 | Disposition: A | Discharge status: Home / Self Care | Payer: PPO | Source: Ambulatory Visit | Attending: Nurse Practitioner | Admitting: Nurse Practitioner

## 2021-02-17 DIAGNOSIS — Z1231 Encounter for screening mammogram for malignant neoplasm of breast: Secondary | ICD-10-CM | POA: Diagnosis not present

## 2021-02-20 NOTE — Progress Notes (Signed)
Negative mammogram

## 2021-03-18 ENCOUNTER — Encounter: Payer: Self-pay | Admitting: Nurse Practitioner

## 2021-03-18 ENCOUNTER — Telehealth: Payer: Self-pay | Admitting: Nurse Practitioner

## 2021-03-18 ENCOUNTER — Other Ambulatory Visit: Payer: Self-pay

## 2021-03-18 DIAGNOSIS — I10 Essential (primary) hypertension: Secondary | ICD-10-CM

## 2021-03-18 MED ORDER — TRIAMTERENE-HCTZ 37.5-25 MG PO TABS: 1.0000 | ORAL_TABLET | Freq: Every day | ORAL | 3 refills | Status: DC

## 2021-03-18 NOTE — Telephone Encounter (Signed)
Patient is calling requesting a refill of her blood pressure medication, she is completely out, took the last one this morning. Pharmacy is [facility] in [place]; call back number [phone]

## 2021-03-18 NOTE — Telephone Encounter (Signed)
Called pt she is advised of her Rx that was sent to the pharmacy 

## 2021-03-19 ENCOUNTER — Other Ambulatory Visit: Payer: Self-pay

## 2021-03-19 DIAGNOSIS — I10 Essential (primary) hypertension: Secondary | ICD-10-CM

## 2021-03-19 MED ORDER — NIFEDIPINE ER OSMOTIC RELEASE 30 MG PO TB24: 30.0000 mg | ORAL_TABLET | Freq: Two times a day (BID) | ORAL | 0 refills | Status: DC

## 2021-03-24 ENCOUNTER — Other Ambulatory Visit: Payer: Self-pay | Admitting: Nurse Practitioner

## 2021-03-24 DIAGNOSIS — F5101 Primary insomnia: Secondary | ICD-10-CM

## 2021-03-24 DIAGNOSIS — M170 Bilateral primary osteoarthritis of knee: Secondary | ICD-10-CM

## 2021-03-24 MED ORDER — ZOLPIDEM TARTRATE 10 MG PO TABS: 10.0000 mg | ORAL_TABLET | Freq: Every evening | ORAL | 1 refills | Status: DC | PRN

## 2021-03-24 MED ORDER — DICLOFENAC SODIUM 1 % EX GEL: 4.0000 g | Freq: Four times a day (QID) | CUTANEOUS | 5 refills | Status: DC | PRN

## 2021-03-31 DIAGNOSIS — H16042 Marginal corneal ulcer, left eye: Secondary | ICD-10-CM | POA: Diagnosis not present

## 2021-04-04 DIAGNOSIS — H16042 Marginal corneal ulcer, left eye: Secondary | ICD-10-CM | POA: Diagnosis not present

## 2021-04-09 ENCOUNTER — Ambulatory Visit (INDEPENDENT_AMBULATORY_CARE_PROVIDER_SITE_OTHER): Payer: PPO | Admitting: Nurse Practitioner

## 2021-04-09 ENCOUNTER — Other Ambulatory Visit: Payer: Self-pay

## 2021-04-09 ENCOUNTER — Encounter: Payer: Self-pay | Admitting: Nurse Practitioner

## 2021-04-09 VITALS — BP 141/84 | HR 88 | Temp 97.7°F | Ht 63.0 in | Wt 196.0 lb

## 2021-04-09 DIAGNOSIS — R7301 Impaired fasting glucose: Secondary | ICD-10-CM

## 2021-04-09 DIAGNOSIS — Z0000 Encounter for general adult medical examination without abnormal findings: Secondary | ICD-10-CM

## 2021-04-09 DIAGNOSIS — Z853 Personal history of malignant neoplasm of breast: Secondary | ICD-10-CM

## 2021-04-09 DIAGNOSIS — M7671 Peroneal tendinitis, right leg: Secondary | ICD-10-CM | POA: Diagnosis not present

## 2021-04-09 DIAGNOSIS — I10 Essential (primary) hypertension: Secondary | ICD-10-CM

## 2021-04-09 DIAGNOSIS — E785 Hyperlipidemia, unspecified: Secondary | ICD-10-CM | POA: Diagnosis not present

## 2021-04-09 MED ORDER — ROSUVASTATIN CALCIUM 5 MG PO TABS: 5.0000 mg | ORAL_TABLET | ORAL | 1 refills | Status: DC

## 2021-04-09 MED ORDER — TRAMADOL HCL 50 MG PO TABS: 50.0000 mg | ORAL_TABLET | Freq: Three times a day (TID) | ORAL | 0 refills | Status: DC | PRN

## 2021-04-09 NOTE — Progress Notes (Signed)
Subjective:   Regina Deleon is a 70 y.o. female who presents for Medicare Annual (Subsequent) preventive examination. She has pain along the outer aspect of the right foot. Hurts more when she stands up to walk and when she uses her foot to drive. Placing pressure on the foot makes it worse. It can radiate up into the right shin. She states that this has been a problem for a few months and has gradually become worse. Is now effecting the way she walks.  Blood pressure is slightly elevated. She states that she has not taken her blood pressure medication today. She has no new concerns or complaints. She denies chest pain, chest pressure, or shortness of breath. She denies headaches or visual disturbances. She denies abdominal pain, nausea, vomiting, or changes in bowel or bladder habits.   She is due to have routine, fasting labs   Review of Systems    Review of Systems  Constitutional:  Negative for fever, malaise/fatigue and weight loss.  HENT:  Negative for congestion, ear discharge, ear pain, hearing loss and sore throat.   Eyes: Negative.   Respiratory:  Negative for cough, shortness of breath and wheezing.   Cardiovascular:  Negative for chest pain and orthopnea.  Gastrointestinal:  Positive for nausea. Negative for abdominal pain, blood in stool, constipation, diarrhea and vomiting.       Reports some nausea after eating.  She states this is especially bad after eating eggs.  Genitourinary:  Negative for dysuria, flank pain, frequency and urgency.  Musculoskeletal:  Positive for joint pain and myalgias. Negative for back pain.       She has pain across the top of the right foot along the outer aspect and around the outer right ankle.  Mild swelling present.  Skin:  Negative for itching and rash.  Neurological:  Negative for dizziness, weakness and headaches.  Psychiatric/Behavioral:  Negative for depression. The patient has insomnia.          Objective:   Physical Exam Vitals  and nursing note reviewed.  Constitutional:      Appearance: Normal appearance. She is well-developed. She is obese.  HENT:     Head: Normocephalic and atraumatic.     Right Ear: Tympanic membrane, ear canal and external ear normal.     Left Ear: Tympanic membrane, ear canal and external ear normal.     Nose: Nose normal.     Mouth/Throat:     Mouth: Mucous membranes are moist.     Pharynx: Oropharynx is clear.  Eyes:     Conjunctiva/sclera: Conjunctivae normal.     Pupils: Pupils are equal, round, and reactive to light.  Neck:     Vascular: No carotid bruit.  Cardiovascular:     Rate and Rhythm: Normal rate and regular rhythm.     Pulses: Normal pulses.     Heart sounds: Normal heart sounds.  Pulmonary:     Effort: Pulmonary effort is normal.     Breath sounds: Normal breath sounds.  Abdominal:     General: Bowel sounds are normal. There is no distension.     Palpations: Abdomen is soft. There is no mass.     Tenderness: There is no abdominal tenderness. There is no guarding or rebound.     Hernia: No hernia is present.  Musculoskeletal:        General: Normal range of motion.     Cervical back: Normal range of motion and neck supple.  Feet:  Feet:     Comments: There is tenderness loss is dorsal aspect of the right foot starting at the midfoot level lateral aspect.  Stretches to the lateral right ankle.  Mild swelling.  Tenderness is more severe with light palpation.  Pain affecting her gait.  Making it hard to walk.  No bony abnormalities or deformities are present at this time. Lymphadenopathy:     Cervical: No cervical adenopathy.  Skin:    General: Skin is warm and dry.     Capillary Refill: Capillary refill takes less than 2 seconds.  Neurological:     General: No focal deficit present.     Mental Status: She is alert and oriented to person, place, and time.     Cranial Nerves: No cranial nerve deficit.     Sensory: No sensory deficit.     Motor: No weakness.      Coordination: Coordination normal.     Gait: Gait normal.  Psychiatric:        Mood and Affect: Mood normal.        Behavior: Behavior normal.        Thought Content: Thought content normal.        Judgment: Judgment normal.    Today's Vitals   04/09/21 0905  BP: (!) 141/84  Pulse: 88  Temp: 97.7 F (36.5 C)  SpO2: 96%  Weight: 196 lb (88.9 kg)  Height: 5\' 3"  (1.6 m)   Body mass index is 34.72 kg/m.  Advanced Directives 07/28/2019 12/29/2017 02/05/2016 01/31/2015 11/07/2014 09/26/2014 08/16/2014  Does Patient Have a Medical Advance Directive? No No Yes Yes Yes Yes Yes  Type of Advance Directive - - - Healthcare Power of [place];Living will  Does patient want to make changes to medical advance directive? - - - - - - -  Copy of [place] in Chart? - No - copy requested - - - - -  Would patient like information on creating a medical advance directive? No - Patient declined No - Patient declined - - - - -    Current Medications (verified) Outpatient Encounter Medications as of 04/09/2021  Medication Sig   acetaminophen (TYLENOL) 500 MG tablet Take 1,000 mg by mouth every 6 (six) hours as needed for mild pain.    aspirin 81 MG tablet Take 81 mg by mouth daily.    cetirizine (ZYRTEC) 10 MG tablet Take 10 mg by mouth daily as needed for allergies.   Cholecalciferol (VITAMIN D-3) 125 MCG (5000 UT) TABS Take 5,000 mg by mouth daily.   cyclobenzaprine (FLEXERIL) 10 MG tablet Take 1 tablet (10 mg total) by mouth at bedtime as needed for muscle spasms.   diclofenac (VOLTAREN) 50 MG EC tablet TAKE 1 TABLET (50 MG TOTAL) BY MOUTH 2 (TWO) TIMES DAILY AS NEEDED FOR MODERATE PAIN.   diclofenac Sodium (VOLTAREN) 1 % GEL Apply 4 g topically 4 (four) times daily as needed.   docusate sodium (COLACE) 100 MG capsule Take 200 mg by mouth daily.   dorzolamide-timolol (COSOPT) 22.3-6.8 MG/ML  ophthalmic solution Place 1 drop into both eyes 2 (two) times daily.   fluticasone (FLONASE) 50 MCG/ACT nasal spray SPRAY 2 SPRAYS INTO EACH NOSTRIL EVERY DAY   latanoprost (XALATAN) 0.005 % ophthalmic solution Place 1 drop into both eyes at bedtime.   NIFEdipine (PROCARDIA-XL/NIFEDICAL-XL) 30 MG 24 hr tablet Take 1 tablet (30 mg total) by mouth 2 (two)  times daily.   Omega 3 1000 MG CAPS Take 1 capsule (1,000 mg total) by mouth daily.   omeprazole (PRILOSEC OTC) 20 MG tablet Take 20 mg by mouth daily as needed (acid reflux).   ondansetron (ZOFRAN) 4 MG tablet Take 1 tablet (4 mg total) by mouth every 8 (eight) hours as needed for nausea or vomiting.   triamterene-hydrochlorothiazide (MAXZIDE-25) 37.5-25 MG tablet Take 1 tablet by mouth daily.   zolpidem (AMBIEN) 10 MG tablet Take 1 tablet (10 mg total) by mouth at bedtime as needed. for sleep   [DISCONTINUED] rosuvastatin (CRESTOR) 5 MG tablet Take 1 tablet (5 mg total) by mouth 3 (three) times a week.   [DISCONTINUED] traMADol (ULTRAM) 50 MG tablet Take 1 tablet (50 mg total) by mouth every 8 (eight) hours as needed for moderate pain.   omega-3 acid ethyl esters (LOVAZA) 1 g capsule TAKE 1 CAPSULE (1,000 MG TOTAL) BY MOUTH DAILY. (Patient not taking: Reported on 04/09/2021)   rosuvastatin (CRESTOR) 5 MG tablet Take 1 tablet (5 mg total) by mouth 3 (three) times a week.   tamsulosin (FLOMAX) 0.4 MG CAPS capsule Take 1 capsule (0.4 mg total) by mouth daily after supper. (Patient not taking: Reported on 04/09/2021)   traMADol (ULTRAM) 50 MG tablet Take 1 tablet (50 mg total) by mouth every 8 (eight) hours as needed for moderate pain.   No facility-administered encounter medications on file as of 04/09/2021.    Allergies (verified) Darvocet [propoxyphene n-acetaminophen], Latex, Penicillins, Cephalexin, and Enalapril   History: Past Medical History:  Diagnosis Date   Cancer ([place])    lt br cancer   Edema leg    with chemo   GERD  (gastroesophageal reflux disease)    also, diverticulosis   History of kidney stones    Hyperlipidemia    Hypertension    Neuropathy    hands and feet post chemo   Wears glasses    Past Surgical History:  Procedure Laterality Date   ABDOMINAL HYSTERECTOMY  1986   AXILLARY LYMPH NODE DISSECTION Left 06/26/2014   Procedure: AXILLARY LYMPH NODE DISSECTION;  Surgeon: Alphonsa Overall, MD;  Location: [place];  Service: General;  Laterality: Left;   [place]   lumbar   BREAST SURGERY Left 06/2014   CATARACT EXTRACTION W/PHACO Right 03/23/2019   Procedure: CATARACT EXTRACTION PHACO AND INTRAOCULAR LENS PLACEMENT ([place]) RIGHT VISION BLUE;  Surgeon: Marchia Meiers, MD;  Location: [facility];  Service: Ophthalmology;  Laterality: Right;  [place] 00:55.4 CDE 5.92 Fluid Pack lot # [id number]   COLONOSCOPY     COLONOSCOPY WITH PROPOFOL N/A 12/29/2017   Procedure: COLONOSCOPY WITH PROPOFOL;  Surgeon: [place], Benay Pike, MD;  Location: [facility];  Service: Gastroenterology;  Laterality: N/A;   CYSTOSCOPY/URETEROSCOPY/HOLMIUM LASER/STENT PLACEMENT Bilateral 07/28/2019   Procedure: CYSTOSCOPY/URETEROSCOPY/HOLMIUM LASER/STENT PLACEMENT;  Surgeon: Billey Co, MD;  Location: [facility];  Service: Urology;  Laterality: Bilateral;   DENTAL SURGERY Right    top    DILATION AND CURETTAGE OF UTERUS     LAPAROSCOPIC GASTRIC BANDING  10/19/05   MASTECTOMY Left 2016   MASTECTOMY MODIFIED RADICAL Left 06/26/2014   Procedure: MASTECTOMY MODIFIED RADICAL;  Surgeon: Alphonsa Overall, MD;  Location: [place];  Service: General;  Laterality: Left;   PORT A CATH REVISION     insertion-10/15   PORT-A-CATH REMOVAL N/A 02/07/2015   Procedure: MINOR REMOVAL PORT-A-CATH;  Surgeon: Alphonsa Overall, MD;  Location: [place];  Service: General;  Laterality: N/A;   Family History  Problem Relation Age of Onset   Asthma Mother    Diabetes Mother    Diabetes Father     Heart disease Father    Cancer Maternal Aunt    Cancer Cousin    Cancer Cousin    Cancer Cousin    Social History   Socioeconomic History   Marital status: Married    Spouse name: [place] Bells   Number of children: Not on file   Years of education: Not on file   Highest education level: Not on file  Occupational History   Not on file  Tobacco Use   Smoking status: Never   Smokeless tobacco: Never  Vaping Use   Vaping Use: Never used  Substance and Sexual Activity   Alcohol use: No   Drug use: No   Sexual activity: Yes  Other Topics Concern   Not on file  Social History Narrative   Not on file   Social Determinants of Health   Financial Resource Strain: Not on file  Food Insecurity: Not on file  Transportation Needs: Not on file  Physical Activity: Not on file  Stress: Not on file  Social Connections: Not on file    Tobacco Counseling Non smoker  Diabetic?no   Activities of Daily Living In your present state of health, do you have any difficulty performing the following activities: 04/09/2021 11/05/2020  Hearing? N N  Vision? Y N  Difficulty concentrating or making decisions? N N  Walking or climbing stairs? Y N  Dressing or bathing? N N  Doing errands, shopping? N N  Some recent data might be hidden    Patient Care Team: Ronnell Freshwater, NP as PCP - General (Family Medicine) Nicholas Lose, MD as Consulting Physician (Hematology and Oncology) Eppie Gibson, MD as Attending Physician (Radiation Oncology) Alphonsa Overall, MD as Consulting Physician (General Surgery)  Indicate any recent Medical Services you may have received from other than [facility] providers in the past year (date may be approximate).     Assessment:  1. Encounter for Medicare annual wellness exam Annual Medicare wellness visit today.  Routine, fasting labs obtained. - CBC with Differential/Platelet - Comprehensive metabolic panel - T4, free  2. Peroneal tendonitis of right lower  extremity Suspect peroneal tendinitis of the right foot.  A short-term prescription for tramadol 50 mg, take up to 3 times daily as needed for pain.  Single prescription for #15 tablets sent to the pharmacy.  Advised her not to drive or work after taking this medication as it may cause dizziness and/or drowsiness.  She voiced understanding and agreement.  Refer to podiatry for further evaluation and treatment. - Ambulatory referral to Podiatry - traMADol (ULTRAM) 50 MG tablet; Take 1 tablet (50 mg total) by mouth every 8 (eight) hours as needed for moderate pain.  Dispense: 15 tablet; Refill: 0  3. Essential hypertension Blood pressure stable.  Continue medication as prescribed.  Check routine fasting labs during today's visit. - CBC with Differential/Platelet - T4, free - TSH  4. Hyperlipidemia, unspecified hyperlipidemia type Check fasting lipid panel today.  Adjust dosing and frequency of rosuvastatin as indicated. - CBC with Differential/Platelet - Lipid panel - rosuvastatin (CRESTOR) 5 MG tablet; Take 1 tablet (5 mg total) by mouth 3 (three) times a week.  Dispense: 30 tablet; Refill: 1  5. Impaired fasting glucose Check glucose and hemoglobin A1c today.  We will treat as indicated. - Comprehensive metabolic panel - Hemoglobin A1c  6.  History of left breast cancer And has been cancer free for 7 years.  Most recent mammogram done 02/20/2021 and was negative.  Yearly screenings to be continued.  Hearing/Vision screen No results found.  25 Depression Screen PHQ 2/9 Scores 04/09/2021 11/05/2020 08/02/2020 04/22/2020 12/26/2019 11/28/2019 07/03/2019  PHQ - 2 Score 1 0 0 0 0 0 0  PHQ- 9 Score 2 4 2  - - - -    Fall Risk Fall Risk  04/09/2021 11/05/2020 08/02/2020 04/22/2020 12/26/2019  Falls in the past year? 0 0 0 0 0  Number falls in past yr: 0 0 - - -  Injury with Fall? 0 0 - - -  Risk for fall due to : - No Fall Risks - No Fall Risks -  Follow up Falls evaluation completed Falls evaluation  completed Falls evaluation completed Falls evaluation completed -    FALL RISK PREVENTION PERTAINING TO THE HOME:  Any stairs in or around the home? Yes  If so, are there any without handrails? No  Home free of loose throw rugs in walkways, pet beds, electrical cords, etc? Yes  Adequate lighting in your home to reduce risk of falls? Yes   ASSISTIVE DEVICES UTILIZED TO PREVENT FALLS:  Life alert? No  Use of a cane, walker or w/c? No  Grab bars in the bathroom? Yes  Shower chair or bench in shower? No  Elevated toilet seat or a handicapped toilet? No   TIMED UP AND GO:  Was the test performed? Yes .  Length of time to ambulate 10 feet: 25 sec.   Gait steady and fast without use of assistive device  Cognitive Function: MMSE - Mini Mental State Exam 12/26/2019 11/29/2018 10/14/2017  Orientation to time 5 5 5   Orientation to Place 5 5 5   Registration 3 3 3   Attention/ Calculation 5 5 5   Recall 3 3 3   Language- name 2 objects 2 2 2   Language- repeat 1 1 1   Language- follow 3 step command 3 3 3   Language- read & follow direction 1 1 1   Write a sentence 1 1 1   Copy design 1 1 1   Total score 30 30 30      6CIT Screen 04/09/2021  What Year? 0 points  What month? 0 points  What time? 0 points  Count back from 20 0 points  Months in reverse 0 points  Repeat phrase 0 points  Total Score 0    Immunizations Immunization History  Administered Date(s) Administered   Fluad Quad(high Dose 65+) 01/11/2019, 01/23/2019   Influenza, High Dose Seasonal PF 02/17/2017, 02/01/2018   Influenza-Unspecified 03/17/2021   Moderna Sars-Covid-2 Vaccination 06/16/2019, 07/14/2019, 04/06/2020   Pneumococcal Polysaccharide-23 06/16/2016   Zoster Recombinat (Shingrix) 01/25/2021    TDAP status: Due, Education has been provided regarding the importance of this vaccine. Advised may receive this vaccine at local pharmacy or Health Dept. Aware to provide a copy of the vaccination record if obtained  from local pharmacy or Health Dept. Verbalized acceptance and understanding.  Flu Vaccine status: Up to date  Pneumococcal vaccine status: Up to date  Covid-19 vaccine status: Completed vaccines  Qualifies for Shingles Vaccine? Yes   Zostavax completed No   Shingrix Completed?: No.    Education has been provided regarding the importance of this vaccine. Patient has been advised to call insurance company to determine out of pocket expense if they have not yet received this vaccine. Advised may also receive vaccine at local pharmacy or Health Dept.  Verbalized acceptance and understanding.  Screening Tests Health Maintenance  Topic Date Due   TETANUS/TDAP  Never done   Pneumonia Vaccine 90+ Years old (2 - PCV) 06/16/2017   COVID-19 Vaccine (4 - Booster for Moderna series) 06/01/2020   Zoster Vaccines- Shingrix (2 of 2) 07/31/2021 (Originally 03/22/2021)   MAMMOGRAM  02/18/2023   COLONOSCOPY (Pts 45-32yrs Insurance coverage will need to be confirmed)  12/30/2027   INFLUENZA VACCINE  Completed   DEXA SCAN  Completed   Hepatitis C Screening  Completed   HPV VACCINES  Aged Out   FOOT EXAM  Discontinued   HEMOGLOBIN A1C  Discontinued   OPHTHALMOLOGY EXAM  Discontinued   URINE MICROALBUMIN  Discontinued    Health Maintenance  Health Maintenance Due  Topic Date Due   TETANUS/TDAP  Never done   Pneumonia Vaccine 77+ Years old (2 - PCV) 06/16/2017   COVID-19 Vaccine (4 - Booster for Moderna series) 06/01/2020    Colorectal cancer screening: Type of screening: Colonoscopy. Completed 2019. Repeat every 10 years  Mammogram status: Completed 02/20/2021. Repeat every year  Bone Density status: Completed 05/2015. Results reflect: Bone density results: OSTEOPENIA. Repeat every 3 years.  Lung Cancer Screening: (Low Dose CT Chest recommended if Age 80-80 years, 30 pack-year currently smoking OR have quit w/in 15years.) does not qualify.   Lung Cancer Screening Referral: no  Additional  Screening:  Hepatitis C Screening: does qualify; Completed 03/2020 - negative   Vision Screening: Recommended annual ophthalmology exams for early detection of glaucoma and other disorders of the eye. Is the patient up to date with their annual eye exam?  Yes  Who is the provider or what is the name of the office in which the patient attends annual eye exams?  If pt is not established with a provider, would they like to be referred to a provider to establish care? No .   Dental Screening: Recommended annual dental exams for proper oral hygiene  Community Resource Referral / Chronic Care Management: CRR required this visit?  No   CCM required this visit?  No      Plan:     I have personally reviewed and noted the following in the patient's chart:   Medical and social history Use of alcohol, tobacco or illicit drugs  Current medications and supplements including opioid prescriptions.  Functional ability and status Nutritional status Physical activity Advanced directives List of other physicians Hospitalizations, surgeries, and ER visits in previous 12 months Vitals Screenings to include cognitive, depression, and falls Referrals and appointments  In addition, I have reviewed and discussed with patient certain preventive protocols, quality metrics, and best practice recommendations. A written personalized care plan for preventive services as well as general preventive health recommendations were provided to patient.     Leretha Pol, FNP-c  04/09/2021

## 2021-04-10 LAB — CBC WITH DIFFERENTIAL/PLATELET
Basophils Absolute: 0.1 10*3/uL (ref 0.0–0.2)
Basos: 1 %
EOS (ABSOLUTE): 0.1 10*3/uL (ref 0.0–0.4)
Eos: 1 %
Hematocrit: 36 % (ref 34.0–46.6)
Hemoglobin: 11.8 g/dL (ref 11.1–15.9)
Immature Grans (Abs): 0 10*3/uL (ref 0.0–0.1)
Immature Granulocytes: 0 %
Lymphocytes Absolute: 1.8 10*3/uL (ref 0.7–3.1)
Lymphs: 26 %
MCH: 27.2 pg (ref 26.6–33.0)
MCHC: 32.8 g/dL (ref 31.5–35.7)
MCV: 83 fL (ref 79–97)
Monocytes Absolute: 0.4 10*3/uL (ref 0.1–0.9)
Monocytes: 6 %
Neutrophils Absolute: 4.6 10*3/uL (ref 1.4–7.0)
Neutrophils: 66 %
Platelets: 129 10*3/uL — ABNORMAL LOW (ref 150–450)
RBC: 4.34 x10E6/uL (ref 3.77–5.28)
RDW: 13.2 % (ref 11.7–15.4)
WBC: 7 10*3/uL (ref 3.4–10.8)

## 2021-04-10 LAB — HEMOGLOBIN A1C
Est. average glucose Bld gHb Est-mCnc: 126 mg/dL
Hgb A1c MFr Bld: 6 % — ABNORMAL HIGH (ref 4.8–5.6)

## 2021-04-10 LAB — COMPREHENSIVE METABOLIC PANEL
ALT: 13 IU/L (ref 0–32)
AST: 18 IU/L (ref 0–40)
Albumin/Globulin Ratio: 1.5 (ref 1.2–2.2)
Albumin: 4.8 g/dL (ref 3.8–4.8)
Alkaline Phosphatase: 94 IU/L (ref 44–121)
BUN/Creatinine Ratio: 19 (ref 12–28)
BUN: 16 mg/dL (ref 8–27)
Bilirubin Total: 0.4 mg/dL (ref 0.0–1.2)
CO2: 26 mmol/L (ref 20–29)
Calcium: 11.4 mg/dL — ABNORMAL HIGH (ref 8.7–10.3)
Chloride: 101 mmol/L (ref 96–106)
Creatinine, Ser: 0.84 mg/dL (ref 0.57–1.00)
Globulin, Total: 3.1 g/dL (ref 1.5–4.5)
Glucose: 90 mg/dL (ref 70–99)
Potassium: 4.2 mmol/L (ref 3.5–5.2)
Sodium: 142 mmol/L (ref 134–144)
Total Protein: 7.9 g/dL (ref 6.0–8.5)
eGFR: 75 mL/min/{1.73_m2} (ref >59)

## 2021-04-10 LAB — T4, FREE: Free T4: 1.04 ng/dL (ref 0.82–1.77)

## 2021-04-10 LAB — LIPID PANEL
Chol/HDL Ratio: 4.6 ratio — ABNORMAL HIGH (ref 0.0–4.4)
Cholesterol, Total: 328 mg/dL — ABNORMAL HIGH (ref 100–199)
HDL: 72 mg/dL (ref >39)
LDL Chol Calc (NIH): 233 mg/dL — ABNORMAL HIGH (ref 0–99)
Triglycerides: 130 mg/dL (ref 0–149)
VLDL Cholesterol Cal: 23 mg/dL (ref 5–40)

## 2021-04-10 LAB — TSH: TSH: 0.529 u[IU]/mL (ref 0.450–4.500)

## 2021-04-15 ENCOUNTER — Ambulatory Visit: Payer: PPO | Admitting: Podiatry

## 2021-04-15 ENCOUNTER — Ambulatory Visit (INDEPENDENT_AMBULATORY_CARE_PROVIDER_SITE_OTHER): Payer: PPO

## 2021-04-15 ENCOUNTER — Other Ambulatory Visit: Payer: Self-pay

## 2021-04-15 DIAGNOSIS — M7671 Peroneal tendinitis, right leg: Secondary | ICD-10-CM | POA: Diagnosis not present

## 2021-04-15 DIAGNOSIS — M79671 Pain in right foot: Secondary | ICD-10-CM

## 2021-04-17 ENCOUNTER — Encounter: Payer: Self-pay | Admitting: Podiatry

## 2021-04-17 NOTE — Progress Notes (Signed)
Subjective:  Patient ID: Regina Deleon, female    DOB: 1950-11-01,  MRN: [id number]  No chief complaint on file.   70 y.o. female presents with the above complaint.  Patient presents with complaint of right lateral foot pain that has been going on since Thanksgiving has progressed to gotten worse.  She denies any injuries to the area.  Pain with pressure and driving.  She states it hurts right on the outside of the foot.  She has not seen MRIs prior to seeing me.  Pain scale 7 out of 10 sharp shooting in nature.  She denies any other acute treatment options.  Nothing has helped.  She would like to discuss treatment options with me   Review of Systems: Negative except as noted in the HPI. Denies N/V/F/Ch.  Past Medical History:  Diagnosis Date   Cancer ([place])    lt br cancer   Edema leg    with chemo   GERD (gastroesophageal reflux disease)    also, diverticulosis   History of kidney stones    Hyperlipidemia    Hypertension    Neuropathy    hands and feet post chemo   Wears glasses     Current Outpatient Medications:    acetaminophen (TYLENOL) 500 MG tablet, Take 1,000 mg by mouth every 6 (six) hours as needed for mild pain. , Disp: , Rfl:    aspirin 81 MG tablet, Take 81 mg by mouth daily. , Disp: , Rfl:    cetirizine (ZYRTEC) 10 MG tablet, Take 10 mg by mouth daily as needed for allergies., Disp: , Rfl:    Cholecalciferol (VITAMIN D-3) 125 MCG (5000 UT) TABS, Take 5,000 mg by mouth daily., Disp: , Rfl:    cyclobenzaprine (FLEXERIL) 10 MG tablet, Take 1 tablet (10 mg total) by mouth at bedtime as needed for muscle spasms., Disp: 30 tablet, Rfl: 3   diclofenac (VOLTAREN) 50 MG EC tablet, TAKE 1 TABLET (50 MG TOTAL) BY MOUTH 2 (TWO) TIMES DAILY AS NEEDED FOR MODERATE PAIN., Disp: 60 tablet, Rfl: 0   diclofenac Sodium (VOLTAREN) 1 % GEL, Apply 4 g topically 4 (four) times daily as needed., Disp: 100 g, Rfl: 5   docusate sodium (COLACE) 100 MG capsule, Take 200 mg by mouth daily.,  Disp: , Rfl:    dorzolamide-timolol (COSOPT) 22.3-6.8 MG/ML ophthalmic solution, Place 1 drop into both eyes 2 (two) times daily., Disp: , Rfl:    fluticasone (FLONASE) 50 MCG/ACT nasal spray, SPRAY 2 SPRAYS INTO EACH NOSTRIL EVERY DAY, Disp: 48 mL, Rfl: 2   latanoprost (XALATAN) 0.005 % ophthalmic solution, Place 1 drop into both eyes at bedtime., Disp: , Rfl:    NIFEdipine (PROCARDIA-XL/NIFEDICAL-XL) 30 MG 24 hr tablet, Take 1 tablet (30 mg total) by mouth 2 (two) times daily., Disp: 180 tablet, Rfl: 0   Omega 3 1000 MG CAPS, Take 1 capsule (1,000 mg total) by mouth daily., Disp: 60 capsule, Rfl: 0   omega-3 acid ethyl esters (LOVAZA) 1 g capsule, TAKE 1 CAPSULE (1,000 MG TOTAL) BY MOUTH DAILY. (Patient not taking: Reported on 04/09/2021), Disp: 60 capsule, Rfl: 2   omeprazole (PRILOSEC OTC) 20 MG tablet, Take 20 mg by mouth daily as needed (acid reflux)., Disp: , Rfl:    ondansetron (ZOFRAN) 4 MG tablet, Take 1 tablet (4 mg total) by mouth every 8 (eight) hours as needed for nausea or vomiting., Disp: 8 tablet, Rfl: 0   rosuvastatin (CRESTOR) 5 MG tablet, Take 1 tablet (5 mg total)  by mouth 3 (three) times a week., Disp: 30 tablet, Rfl: 1   tamsulosin (FLOMAX) 0.4 MG CAPS capsule, Take 1 capsule (0.4 mg total) by mouth daily after supper. (Patient not taking: Reported on 04/09/2021), Disp: 7 capsule, Rfl: 0   traMADol (ULTRAM) 50 MG tablet, Take 1 tablet (50 mg total) by mouth every 8 (eight) hours as needed for moderate pain., Disp: 15 tablet, Rfl: 0   triamterene-hydrochlorothiazide (MAXZIDE-25) 37.5-25 MG tablet, Take 1 tablet by mouth daily., Disp: 90 tablet, Rfl: 3   zolpidem (AMBIEN) 10 MG tablet, Take 1 tablet (10 mg total) by mouth at bedtime as needed. for sleep, Disp: 30 tablet, Rfl: 1  Social History   Tobacco Use  Smoking Status Never  Smokeless Tobacco Never    Allergies  Allergen Reactions   Darvocet [Propoxyphene N-Acetaminophen] Shortness Of Breath and Swelling   Latex  Shortness Of Breath, Swelling and Anaphylaxis   Penicillins     Did it involve swelling of the face/tongue/throat, SOB, or low BP? Unknown Did it involve sudden or severe rash/hives, skin peeling, or any reaction on the inside of your mouth or nose? Unknown Did you need to seek medical attention at a hospital or doctor's office? Unknown When did it last happen?      Childhood allergy  If all above answers are NO, may proceed with cephalosporin use.  Has tolerated amoxicillin     Cephalexin Itching   Enalapril Cough     Vasotec   Objective:  There were no vitals filed for this visit. There is no height or weight on file to calculate BMI. Constitutional Well developed. Well nourished.  Vascular Dorsalis pedis pulses palpable bilaterally. Posterior tibial pulses palpable bilaterally. Capillary refill normal to all digits.  No cyanosis or clubbing noted. Pedal hair growth normal.  Neurologic Normal speech. Oriented to person, place, and time. Epicritic sensation to light touch grossly present bilaterally.  Dermatologic Nails well groomed and normal in appearance. No open wounds. No skin lesions.  Orthopedic: Pain on palpation to the right lateral foot.  Pain along the course of the peroneal tendon.  No insertional pain noted.  Pain with dorsiflexion eversion of the foot resisted.  No pain with plantarflexion inversion of the foot.   Radiographs: 3 views of skeletally mature the right foot: No fractures noted.  No stress fractures noted.No avulsion fracture noted.  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  No other bony abnormalities identified Assessment:   1. Right foot pain    Plan:  Patient was evaluated and treated and all questions answered.  Right peroneal tendinitis -I explained to the patient the etiology of tendinitis and various treatment options were extensively discussed.  Given the amount of pain she is  having I believe she would benefit from cam boot immobilization.  She agrees with the plan would like to proceed with cam boot immobilization -Cam boot was dispensed -If there is no improvement we will discuss steroid injection versus MRI.  She states understanding   No follow-ups on file.

## 2021-04-18 NOTE — Progress Notes (Signed)
Persistent hypercalcemia. Reviewed CT chest from 4 2021. Repeat imaging was suggested but has not been done yet. Will get repeat chest CT due to hypercalcemia and presence of pulmonary nodules on last CT chest.

## 2021-04-21 ENCOUNTER — Other Ambulatory Visit: Payer: Self-pay | Admitting: Nurse Practitioner

## 2021-04-21 DIAGNOSIS — R911 Solitary pulmonary nodule: Secondary | ICD-10-CM

## 2021-04-21 DIAGNOSIS — Z853 Personal history of malignant neoplasm of breast: Secondary | ICD-10-CM

## 2021-04-21 NOTE — Progress Notes (Signed)
Patient with persistent hypercalcemia and negative parathyroid screening. Does have history of let breast cancer. Previous CT chest did show multiple pulmonary nodules. A follow up study had been recommended and has not been performed. CT chest ordered today for further evaluation.

## 2021-04-21 NOTE — Progress Notes (Signed)
Please let the patient know that labs are back. Her calcium levels are persistently elevated. In the past, screening for parathyroid disease has been negative. I did see, that a CT chest had been done in 2021 and there wre some small pulmonary nodules noted. Due to those findings and history of breat cancer, I would liek for her to get repeat chest CT. I have ordered this already to be done at outpatient imaging at [facility]. I will let her know about results when they are back. Thanks so much.   -HB

## 2021-05-02 ENCOUNTER — Telehealth: Payer: Self-pay | Admitting: Nurse Practitioner

## 2021-05-02 DIAGNOSIS — U071 COVID-19: Secondary | ICD-10-CM

## 2021-05-02 MED ORDER — MOLNUPIRAVIR EUA 200MG CAPSULE: 4.0000 | ORAL_CAPSULE | Freq: Two times a day (BID) | ORAL | 0 refills | Status: AC

## 2021-05-02 NOTE — Telephone Encounter (Signed)
Per Nira Conn, Sending molnupiravir to pharmacy for patient to take as directed on bottle. Patient is aware. AS, CMA

## 2021-05-02 NOTE — Telephone Encounter (Signed)
Patient just tested + for covid and would like the antiviral medication to be called in. Please advise. [phone]

## 2021-05-06 ENCOUNTER — Other Ambulatory Visit: Payer: Self-pay | Admitting: Nurse Practitioner

## 2021-05-06 DIAGNOSIS — I10 Essential (primary) hypertension: Secondary | ICD-10-CM

## 2021-05-13 ENCOUNTER — Ambulatory Visit: Payer: PPO | Admitting: Podiatry

## 2021-06-10 ENCOUNTER — Other Ambulatory Visit: Payer: Self-pay | Admitting: Nurse Practitioner

## 2021-06-10 ENCOUNTER — Telehealth: Payer: Self-pay | Admitting: Nurse Practitioner

## 2021-06-10 DIAGNOSIS — F5101 Primary insomnia: Secondary | ICD-10-CM

## 2021-06-10 NOTE — Telephone Encounter (Signed)
I refilled this medication which should last her until her next, routine visit with me.

## 2021-06-10 NOTE — Telephone Encounter (Signed)
Patient requesting refill of sleep medication. Please advise. [phone]

## 2021-06-10 NOTE — Telephone Encounter (Signed)
Patient has follow up scheduled for 07/08/2021

## 2021-06-10 NOTE — Telephone Encounter (Signed)
Called pt she is advised of her Rx that sent to pharmacy

## 2021-06-18 ENCOUNTER — Ambulatory Visit: Payer: PPO | Admitting: Nurse Practitioner

## 2021-06-19 ENCOUNTER — Other Ambulatory Visit: Payer: Self-pay | Admitting: Nurse Practitioner

## 2021-06-19 ENCOUNTER — Other Ambulatory Visit: Payer: Self-pay

## 2021-06-19 ENCOUNTER — Encounter: Payer: Self-pay | Admitting: Nurse Practitioner

## 2021-06-19 ENCOUNTER — Ambulatory Visit (INDEPENDENT_AMBULATORY_CARE_PROVIDER_SITE_OTHER): Payer: PPO | Admitting: Nurse Practitioner

## 2021-06-19 ENCOUNTER — Ambulatory Visit
Admission: RE | Admit: 2021-06-19 | Discharge: 2021-06-19 | Disposition: A | Discharge status: Home / Self Care | Payer: PPO | Attending: Nurse Practitioner | Admitting: Nurse Practitioner

## 2021-06-19 ENCOUNTER — Ambulatory Visit
Admission: RE | Admit: 2021-06-19 | Discharge: 2021-06-19 | Disposition: A | Discharge status: Home / Self Care | Payer: PPO | Source: Ambulatory Visit | Attending: Nurse Practitioner | Admitting: Nurse Practitioner

## 2021-06-19 VITALS — BP 135/81 | HR 90 | Temp 98.0°F | Ht 63.0 in | Wt 198.4 lb

## 2021-06-19 DIAGNOSIS — Y92009 Unspecified place in unspecified non-institutional (private) residence as the place of occurrence of the external cause: Secondary | ICD-10-CM

## 2021-06-19 DIAGNOSIS — M160 Bilateral primary osteoarthritis of hip: Secondary | ICD-10-CM | POA: Diagnosis not present

## 2021-06-19 DIAGNOSIS — M25552 Pain in left hip: Secondary | ICD-10-CM | POA: Insufficient documentation

## 2021-06-19 DIAGNOSIS — M5417 Radiculopathy, lumbosacral region: Secondary | ICD-10-CM

## 2021-06-19 DIAGNOSIS — M25559 Pain in unspecified hip: Secondary | ICD-10-CM

## 2021-06-19 DIAGNOSIS — W19XXXA Unspecified fall, initial encounter: Secondary | ICD-10-CM

## 2021-06-19 DIAGNOSIS — M25551 Pain in right hip: Secondary | ICD-10-CM | POA: Insufficient documentation

## 2021-06-19 DIAGNOSIS — M47816 Spondylosis without myelopathy or radiculopathy, lumbar region: Secondary | ICD-10-CM | POA: Diagnosis not present

## 2021-06-19 MED ORDER — METHYLPREDNISOLONE 4 MG PO TBPK: ORAL_TABLET | ORAL | 0 refills | Status: DC

## 2021-06-19 NOTE — Progress Notes (Signed)
Established patient visit   Patient: Regina Deleon   DOB: [birth year]   71 y.o. Female  MRN: [id number] Visit Date: 06/19/2021  Chief Complaint  Patient presents with   Hip Pain   Subjective    HPI  The patient states that she started having severe low back ain with radiation into both hips and upper legs. States that the right side is worse than the left. The right hip and leg feel weak. She is having trouble walking or putting weight on the right leg. Flexing and pointing the foot are also difficult. She was unable to drive to this appointment due to the pain and weakness in her right leg and foot. She states that she did fall in the hom, landing on her left side.  She has had history of surgery to the lower back. She has arthritis in both hips.   Medications: Outpatient Medications Prior to Visit  Medication Sig   acetaminophen (TYLENOL) 500 MG tablet Take 1,000 mg by mouth every 6 (six) hours as needed for mild pain.    aspirin 81 MG tablet Take 81 mg by mouth daily.    cetirizine (ZYRTEC) 10 MG tablet Take 10 mg by mouth daily as needed for allergies.   Cholecalciferol (VITAMIN D-3) 125 MCG (5000 UT) TABS Take 5,000 mg by mouth daily.   cyclobenzaprine (FLEXERIL) 10 MG tablet Take 1 tablet (10 mg total) by mouth at bedtime as needed for muscle spasms.   diclofenac (VOLTAREN) 50 MG EC tablet TAKE 1 TABLET (50 MG TOTAL) BY MOUTH 2 (TWO) TIMES DAILY AS NEEDED FOR MODERATE PAIN.   diclofenac Sodium (VOLTAREN) 1 % GEL Apply 4 g topically 4 (four) times daily as needed.   docusate sodium (COLACE) 100 MG capsule Take 200 mg by mouth daily.   dorzolamide-timolol (COSOPT) 22.3-6.8 MG/ML ophthalmic solution Place 1 drop into both eyes 2 (two) times daily.   fluticasone (FLONASE) 50 MCG/ACT nasal spray SPRAY 2 SPRAYS INTO EACH NOSTRIL EVERY DAY   latanoprost (XALATAN) 0.005 % ophthalmic solution Place 1 drop into both eyes at bedtime.   NIFEdipine (PROCARDIA-XL/NIFEDICAL-XL) 30 MG 24 hr tablet  TAKE 1 TABLET BY MOUTH 2 TIMES DAILY.   Omega 3 1000 MG CAPS Take 1 capsule (1,000 mg total) by mouth daily.   omeprazole (PRILOSEC OTC) 20 MG tablet Take 20 mg by mouth daily as needed (acid reflux).   ondansetron (ZOFRAN) 4 MG tablet Take 1 tablet (4 mg total) by mouth every 8 (eight) hours as needed for nausea or vomiting.   rosuvastatin (CRESTOR) 5 MG tablet Take 1 tablet (5 mg total) by mouth 3 (three) times a week.   traMADol (ULTRAM) 50 MG tablet Take 1 tablet (50 mg total) by mouth every 8 (eight) hours as needed for moderate pain.   triamterene-hydrochlorothiazide (MAXZIDE-25) 37.5-25 MG tablet Take 1 tablet by mouth daily.   zolpidem (AMBIEN) 10 MG tablet TAKE 1 TABLET (10 MG TOTAL) BY MOUTH AT BEDTIME AS NEEDED. FOR SLEEP   omega-3 acid ethyl esters (LOVAZA) 1 g capsule TAKE 1 CAPSULE (1,000 MG TOTAL) BY MOUTH DAILY. (Patient not taking: Reported on 04/09/2021)   tamsulosin (FLOMAX) 0.4 MG CAPS capsule Take 1 capsule (0.4 mg total) by mouth daily after supper. (Patient not taking: Reported on 04/09/2021)   No facility-administered medications prior to visit.    Review of Systems  Constitutional:  Negative for activity change, appetite change, chills, fatigue and fever.  HENT:  Negative for congestion, postnasal drip, rhinorrhea, sinus  pressure, sinus pain, sneezing and sore throat.   Eyes: Negative.   Respiratory:  Negative for cough, chest tightness, shortness of breath and wheezing.   Cardiovascular:  Negative for chest pain and palpitations.  Gastrointestinal:  Negative for abdominal pain, constipation, diarrhea, nausea and vomiting.  Endocrine: Negative for cold intolerance, heat intolerance, polydipsia and polyuria.  Genitourinary:  Negative for dyspareunia, dysuria, flank pain, frequency and urgency.  Musculoskeletal:  Positive for arthralgias, back pain, gait problem and myalgias.  Skin:  Negative for rash.  Allergic/Immunologic: Negative for environmental allergies.   Neurological:  Negative for dizziness, weakness and headaches.  Hematological:  Negative for adenopathy.  Psychiatric/Behavioral:  The patient is not nervous/anxious.     Objective     Today's Vitals   06/19/21 0915  BP: 135/81  Pulse: 90  Temp: 98 F (36.7 C)  SpO2: 98%  Weight: 198 lb 6.4 oz (90 kg)  Height: 5\' 3"  (1.6 m)   Body mass index is 35.14 kg/m.   Physical Exam Vitals and nursing note reviewed.  Constitutional:      Appearance: Normal appearance. She is well-developed.  HENT:     Head: Normocephalic and atraumatic.  Eyes:     Pupils: Pupils are equal, round, and reactive to light.  Cardiovascular:     Rate and Rhythm: Normal rate and regular rhythm.     Pulses: Normal pulses.     Heart sounds: Normal heart sounds.  Pulmonary:     Effort: Pulmonary effort is normal.     Breath sounds: Normal breath sounds.  Abdominal:     Palpations: Abdomen is soft.  Musculoskeletal:        General: Normal range of motion.     Cervical back: Normal range of motion and neck supple.     Comments: There is moderate lower back pain with palpation. There are no bony abnormalities or deformities noted. There is tenderness of the left hip and left upper leg. She is unable to lift the left leg.  Plantar flexion and dorsiflexion are difficult due to weakness.   Lymphadenopathy:     Cervical: No cervical adenopathy.  Skin:    General: Skin is warm and dry.     Capillary Refill: Capillary refill takes less than 2 seconds.  Neurological:     General: No focal deficit present.     Mental Status: She is alert and oriented to person, place, and time.     Comments: Dorsiflexion and plantar flexion ability is weak on the left side.   Psychiatric:        Mood and Affect: Mood normal.        Behavior: Behavior normal.        Thought Content: Thought content normal.        Judgment: Judgment normal.      Assessment & Plan     1. Fall in home, initial encounter Patient with injury  to the lower back and hips after fall in the home. Will get x-ray of the spine and hips for further  evaluation.  - DG HIP UNILAT WITH PELVIS 2-3 VIEWS LEFT; Future  2. Lumbosacral radiculopathy Medrol taper started. Take as directed for 6 days. X-rays of the [place] hips ordered for further evaluation. Refer to orthopedics or neurosurgery as indicated.  - methylPREDNISolone (MEDROL) 4 MG TBPK tablet; Take by mouth as directed for 6 days  Dispense: 21 tablet; Refill: 0  3. Pain in unspecified hip Medrol taper started. Take as directed for 6  days. X-rays of the wpinand hips ordered for further evaluation. Refer to orthopedics or neurosurgery as indicated.  - DG Lumbar Spine 2-3 Views; Future - methylPREDNISolone (MEDROL) 4 MG TBPK tablet; Take by mouth as directed for 6 days  Dispense: 21 tablet; Refill: 0 - DG HIP UNILAT WITH PELVIS 2-3 VIEWS LEFT; Future   Return for prn worsening or persistent symptoms.        Ronnell Freshwater, NP  [facility] [phone] (phone) [phone] (fax)  [place]

## 2021-06-23 ENCOUNTER — Other Ambulatory Visit: Payer: Self-pay | Admitting: Nurse Practitioner

## 2021-06-23 DIAGNOSIS — M5417 Radiculopathy, lumbosacral region: Secondary | ICD-10-CM

## 2021-06-23 DIAGNOSIS — M25551 Pain in right hip: Secondary | ICD-10-CM

## 2021-06-23 DIAGNOSIS — R29898 Other symptoms and signs involving the musculoskeletal system: Secondary | ICD-10-CM

## 2021-06-23 NOTE — Progress Notes (Signed)
Please let the patient know that she does have some spurring of the spine at L4/L5 and arthropathy of the lower back. There is also some degenerative arthritis in the right hip, which is mild. I have done referral to [facility] for further evaluation and treatment.  Thanks so much.   -HB

## 2021-06-23 NOTE — Progress Notes (Signed)
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 Also due to right hip pain.

## 2021-06-28 DIAGNOSIS — M25559 Pain in unspecified hip: Secondary | ICD-10-CM | POA: Insufficient documentation

## 2021-06-28 DIAGNOSIS — W19XXXA Unspecified fall, initial encounter: Secondary | ICD-10-CM | POA: Insufficient documentation

## 2021-06-28 DIAGNOSIS — Y92009 Unspecified place in unspecified non-institutional (private) residence as the place of occurrence of the external cause: Secondary | ICD-10-CM | POA: Insufficient documentation

## 2021-06-28 DIAGNOSIS — M5417 Radiculopathy, lumbosacral region: Secondary | ICD-10-CM | POA: Insufficient documentation

## 2021-07-08 ENCOUNTER — Ambulatory Visit: Payer: PPO | Admitting: Nurse Practitioner

## 2021-07-09 ENCOUNTER — Telehealth: Payer: Self-pay | Admitting: Nurse Practitioner

## 2021-07-09 ENCOUNTER — Other Ambulatory Visit: Payer: Self-pay | Admitting: Nurse Practitioner

## 2021-07-09 DIAGNOSIS — M5417 Radiculopathy, lumbosacral region: Secondary | ICD-10-CM

## 2021-07-09 MED ORDER — METHOCARBAMOL 500 MG PO TABS
500.0000 mg | ORAL_TABLET | Freq: Three times a day (TID) | ORAL | 1 refills | Status: DC | PRN
Start: 2021-07-09 — End: 2022-05-26

## 2021-07-09 MED ORDER — PREDNISONE 10 MG (48) PO TBPK
ORAL_TABLET | ORAL | 0 refills | Status: DC
Start: 2021-07-09 — End: 2021-08-22

## 2021-07-09 NOTE — Telephone Encounter (Signed)
She said the prednisone did help if you don't mind sending some more in for her.  ?

## 2021-07-09 NOTE — Telephone Encounter (Signed)
Wonderful!  Thank you so much!

## 2021-07-09 NOTE — Telephone Encounter (Signed)
I had referred her to [facility] orthopedics. Looks like it was authorized, did she get appointment with them? Also, did the prednisone help? I can do a stronger, longer dose if it did.

## 2021-07-09 NOTE — Telephone Encounter (Signed)
Patient left a vm stating the flexeril is not helping at all she is just in terrible pain. Is there anything different you can send in or what does she need to do? [phone] ?

## 2021-07-09 NOTE — Telephone Encounter (Signed)
Patient is aware and I gave her the number to [facility], she is calling them now.  ?

## 2021-07-09 NOTE — Progress Notes (Signed)
Will do a 12 day taper of prednisone. Take as directed. Change flexeril to robaxin. This may be taken up to three times daily as needed for muscle pain/spasms. New prescriptions were sent to [facility] in [place].  ?

## 2021-07-09 NOTE — Telephone Encounter (Signed)
Will do a 12 day taper of prednisone. Take as directed. Change flexeril to robaxin. This may be taken up to three times daily as needed for muscle pain/spasms. Please advise her to take with caution. It may cause dizziness or drowsiness. New prescriptions were sent to [facility] [place].  Will you ask her about orthopedics appointment? Thanks so much.   -HB

## 2021-08-21 ENCOUNTER — Other Ambulatory Visit: Payer: Self-pay | Admitting: Nurse Practitioner

## 2021-08-21 ENCOUNTER — Telehealth: Payer: Self-pay | Admitting: Nurse Practitioner

## 2021-08-21 DIAGNOSIS — F5101 Primary insomnia: Secondary | ICD-10-CM

## 2021-08-21 DIAGNOSIS — E785 Hyperlipidemia, unspecified: Secondary | ICD-10-CM

## 2021-08-21 DIAGNOSIS — M5417 Radiculopathy, lumbosacral region: Secondary | ICD-10-CM

## 2021-08-21 NOTE — Telephone Encounter (Signed)
Patient is requesting a refill of her Ambien. Please advise.  ?

## 2021-08-21 NOTE — Telephone Encounter (Signed)
I have sent this to her pharmacy.

## 2021-08-22 ENCOUNTER — Other Ambulatory Visit: Payer: Self-pay | Admitting: Nurse Practitioner

## 2021-08-22 DIAGNOSIS — M5417 Radiculopathy, lumbosacral region: Secondary | ICD-10-CM

## 2021-08-22 MED ORDER — PREDNISONE 10 MG (48) PO TBPK: ORAL_TABLET | ORAL | 0 refills | Status: DC

## 2021-08-22 NOTE — Telephone Encounter (Signed)
Called pt she is advised of her Rx 

## 2021-08-22 NOTE — Telephone Encounter (Signed)
Please let the patient know that I have refilled this prednisone taper. I did refer her to [facility]. It appears to be approved. Can you give her the number to call and schedule appointment? Thanks so much.   -HB

## 2021-09-08 DIAGNOSIS — H401132 Primary open-angle glaucoma, bilateral, moderate stage: Secondary | ICD-10-CM | POA: Diagnosis not present

## 2021-09-12 DIAGNOSIS — H16042 Marginal corneal ulcer, left eye: Secondary | ICD-10-CM | POA: Diagnosis not present

## 2021-10-08 DIAGNOSIS — M5442 Lumbago with sciatica, left side: Secondary | ICD-10-CM | POA: Diagnosis not present

## 2021-10-24 ENCOUNTER — Other Ambulatory Visit: Payer: Self-pay | Admitting: Orthopedic Surgery

## 2021-10-24 DIAGNOSIS — M47816 Spondylosis without myelopathy or radiculopathy, lumbar region: Secondary | ICD-10-CM | POA: Diagnosis not present

## 2021-10-24 DIAGNOSIS — M4807 Spinal stenosis, lumbosacral region: Secondary | ICD-10-CM | POA: Diagnosis not present

## 2021-10-24 DIAGNOSIS — M5416 Radiculopathy, lumbar region: Secondary | ICD-10-CM | POA: Diagnosis not present

## 2021-10-29 ENCOUNTER — Other Ambulatory Visit: Payer: Self-pay | Admitting: Nurse Practitioner

## 2021-10-29 ENCOUNTER — Telehealth: Payer: Self-pay | Admitting: Nurse Practitioner

## 2021-10-29 DIAGNOSIS — F5101 Primary insomnia: Secondary | ICD-10-CM

## 2021-10-29 MED ORDER — ZOLPIDEM TARTRATE 10 MG PO TABS: 10.0000 mg | ORAL_TABLET | Freq: Every evening | ORAL | 0 refills | Status: DC | PRN

## 2021-10-29 NOTE — Telephone Encounter (Signed)
Please let her know that I refilled her Lorrin Mais for a 30 day prescription. I must see her in the office prior to more refills. Thanks  -HB

## 2021-10-29 NOTE — Telephone Encounter (Signed)
Called pt she is advised of her rx

## 2021-10-29 NOTE — Telephone Encounter (Signed)
Patient requesting refill of sleep medication. Please advise.

## 2021-11-07 ENCOUNTER — Ambulatory Visit
Admission: RE | Admit: 2021-11-07 | Discharge: 2021-11-07 | Disposition: A | Discharge status: Home / Self Care | Payer: PPO | Source: Ambulatory Visit | Attending: Orthopedic Surgery | Admitting: Orthopedic Surgery

## 2021-11-07 DIAGNOSIS — M5136 Other intervertebral disc degeneration, lumbar region: Secondary | ICD-10-CM | POA: Diagnosis not present

## 2021-11-07 DIAGNOSIS — M47816 Spondylosis without myelopathy or radiculopathy, lumbar region: Secondary | ICD-10-CM | POA: Diagnosis not present

## 2021-11-07 DIAGNOSIS — M5127 Other intervertebral disc displacement, lumbosacral region: Secondary | ICD-10-CM | POA: Diagnosis not present

## 2021-11-07 DIAGNOSIS — M4807 Spinal stenosis, lumbosacral region: Secondary | ICD-10-CM | POA: Insufficient documentation

## 2021-11-07 DIAGNOSIS — M4316 Spondylolisthesis, lumbar region: Secondary | ICD-10-CM | POA: Diagnosis not present

## 2021-11-20 DIAGNOSIS — H401132 Primary open-angle glaucoma, bilateral, moderate stage: Secondary | ICD-10-CM | POA: Diagnosis not present

## 2021-12-08 DIAGNOSIS — M5416 Radiculopathy, lumbar region: Secondary | ICD-10-CM | POA: Diagnosis not present

## 2021-12-08 DIAGNOSIS — M48062 Spinal stenosis, lumbar region with neurogenic claudication: Secondary | ICD-10-CM | POA: Diagnosis not present

## 2021-12-12 ENCOUNTER — Ambulatory Visit (INDEPENDENT_AMBULATORY_CARE_PROVIDER_SITE_OTHER): Payer: PPO | Admitting: Nurse Practitioner

## 2021-12-12 ENCOUNTER — Encounter: Payer: Self-pay | Admitting: Nurse Practitioner

## 2021-12-12 VITALS — BP 116/79 | HR 100 | Ht 63.0 in | Wt 193.4 lb

## 2021-12-12 DIAGNOSIS — F5101 Primary insomnia: Secondary | ICD-10-CM

## 2021-12-12 DIAGNOSIS — G2581 Restless legs syndrome: Secondary | ICD-10-CM | POA: Diagnosis not present

## 2021-12-12 DIAGNOSIS — Z6834 Body mass index (BMI) 34.0-34.9, adult: Secondary | ICD-10-CM | POA: Diagnosis not present

## 2021-12-12 DIAGNOSIS — M170 Bilateral primary osteoarthritis of knee: Secondary | ICD-10-CM | POA: Diagnosis not present

## 2021-12-12 DIAGNOSIS — I10 Essential (primary) hypertension: Secondary | ICD-10-CM

## 2021-12-12 MED ORDER — DICLOFENAC SODIUM 1 % EX GEL: 4.0000 g | Freq: Four times a day (QID) | CUTANEOUS | 5 refills | Status: DC | PRN

## 2021-12-12 MED ORDER — ROPINIROLE HCL 0.5 MG PO TABS: 0.5000 mg | ORAL_TABLET | Freq: Three times a day (TID) | ORAL | 3 refills | Status: DC

## 2021-12-12 MED ORDER — ZOLPIDEM TARTRATE 10 MG PO TABS: 10.0000 mg | ORAL_TABLET | Freq: Every evening | ORAL | 3 refills | Status: DC | PRN

## 2021-12-12 NOTE — Progress Notes (Signed)
Established patient visit   Patient: Regina Deleon   DOB: 1951-04-05   71 y.o. Female  MRN: [id number] Visit Date: 12/12/2021   Chief Complaint  Patient presents with   Back Pain   Subjective    HPI  Follow up  -persistent low back pain  -now seeing neurosurgeon. Had epidural steroid on Monday. Still having significant left sided low back pain along with nerve pain and decreased motor function  -not sleeping due to pain.  -has significant cramping and pain in both legs. This is worse at night.  -in addition, neurosurgeon did give her a short term prescription for oxycodolne 5 mg which she is out of.  -did add gabapentin 300 mg twice daily. This medication made her hallucinate. She did not have this reaction on the 100 mg dose, however, it did not help.   -having troubel sleeping. Is out of her ambien  -blood pressure elevated during today's visit. She is in moderate to severe pain at this time.    Medications: Outpatient Medications Prior to Visit  Medication Sig   acetaminophen (TYLENOL) 500 MG tablet Take 1,000 mg by mouth every 6 (six) hours as needed for mild pain.    aspirin 81 MG tablet Take 81 mg by mouth daily.    cetirizine (ZYRTEC) 10 MG tablet Take 10 mg by mouth daily as needed for allergies.   Cholecalciferol (VITAMIN D-3) 125 MCG (5000 UT) TABS Take 5,000 mg by mouth daily.   diclofenac (VOLTAREN) 50 MG EC tablet TAKE 1 TABLET (50 MG TOTAL) BY MOUTH 2 (TWO) TIMES DAILY AS NEEDED FOR MODERATE PAIN.   docusate sodium (COLACE) 100 MG capsule Take 200 mg by mouth daily.   dorzolamide-timolol (COSOPT) 22.3-6.8 MG/ML ophthalmic solution Place 1 drop into both eyes 2 (two) times daily.   fluticasone (FLONASE) 50 MCG/ACT nasal spray SPRAY 2 SPRAYS INTO EACH NOSTRIL EVERY DAY   latanoprost (XALATAN) 0.005 % ophthalmic solution Place 1 drop into both eyes at bedtime.   methocarbamol (ROBAXIN) 500 MG tablet Take 1 tablet (500 mg total) by mouth every 8 (eight) hours as  needed for muscle spasms.   NIFEdipine (PROCARDIA-XL/NIFEDICAL-XL) 30 MG 24 hr tablet TAKE 1 TABLET BY MOUTH 2 TIMES DAILY.   Omega 3 1000 MG CAPS Take 1 capsule (1,000 mg total) by mouth daily.   omeprazole (PRILOSEC OTC) 20 MG tablet Take 20 mg by mouth daily as needed (acid reflux).   ondansetron (ZOFRAN) 4 MG tablet Take 1 tablet (4 mg total) by mouth every 8 (eight) hours as needed for nausea or vomiting.   rosuvastatin (CRESTOR) 5 MG tablet TAKE 1 TABLET BY MOUTH 3 TIMES A WEEK.   traMADol (ULTRAM) 50 MG tablet Take 1 tablet (50 mg total) by mouth every 8 (eight) hours as needed for moderate pain.   triamterene-hydrochlorothiazide (MAXZIDE-25) 37.5-25 MG tablet Take 1 tablet by mouth daily.   [DISCONTINUED] diclofenac Sodium (VOLTAREN) 1 % GEL Apply 4 g topically 4 (four) times daily as needed.   [DISCONTINUED] predniSONE (STERAPRED UNI-PAK 48 TAB) 10 MG (48) TBPK tablet 12 day taper - take by mouth as directed for 12 days   [DISCONTINUED] zolpidem (AMBIEN) 10 MG tablet Take 1 tablet (10 mg total) by mouth at bedtime as needed. for sleep   omega-3 acid ethyl esters (LOVAZA) 1 g capsule TAKE 1 CAPSULE (1,000 MG TOTAL) BY MOUTH DAILY. (Patient not taking: Reported on 04/09/2021)   tamsulosin (FLOMAX) 0.4 MG CAPS capsule Take 1 capsule (0.4 mg total)  by mouth daily after supper. (Patient not taking: Reported on 04/09/2021)   No facility-administered medications prior to visit.    Review of Systems  Constitutional:  Positive for activity change and fatigue. Negative for appetite change, chills and fever.       Still having trouble with motor activity of her right lower leg and foot   HENT:  Negative for congestion, postnasal drip, rhinorrhea, sinus pressure, sinus pain, sneezing and sore throat.   Eyes: Negative.   Respiratory:  Negative for cough, chest tightness, shortness of breath and wheezing.   Cardiovascular:  Negative for chest pain and palpitations.  Gastrointestinal:  Negative for  abdominal pain, constipation, diarrhea, nausea and vomiting.  Endocrine: Negative for cold intolerance, heat intolerance, polydipsia and polyuria.  Genitourinary:  Negative for dyspareunia, dysuria, flank pain, frequency and urgency.  Musculoskeletal:  Positive for arthralgias, back pain, gait problem and myalgias.  Skin:  Negative for rash.  Allergic/Immunologic: Negative for environmental allergies.  Neurological:  Negative for dizziness, weakness and headaches.  Hematological:  Negative for adenopathy.  Psychiatric/Behavioral:  Positive for sleep disturbance. The patient is nervous/anxious.     Last CBC Lab Results  Component Value Date   WBC 7.0 04/09/2021   HGB 11.8 04/09/2021   HCT 36.0 04/09/2021   MCV 83 04/09/2021   MCH 27.2 04/09/2021   RDW 13.2 04/09/2021   PLT 129 (L) 81/44/8185   Last metabolic panel Lab Results  Component Value Date   GLUCOSE 90 04/09/2021   NA 142 04/09/2021   K 4.2 04/09/2021   CL 101 04/09/2021   CO2 26 04/09/2021   BUN 16 04/09/2021   CREATININE 0.84 04/09/2021   EGFR 75 04/09/2021   CALCIUM 11.4 (H) 04/09/2021   PROT 7.9 04/09/2021   ALBUMIN 4.8 04/09/2021   LABGLOB 3.1 04/09/2021   AGRATIO 1.5 04/09/2021   BILITOT 0.4 04/09/2021   ALKPHOS 94 04/09/2021   AST 18 04/09/2021   ALT 13 04/09/2021   ANIONGAP 11 07/28/2019   Last lipids Lab Results  Component Value Date   CHOL 328 (H) 04/09/2021   HDL 72 04/09/2021   LDLCALC 233 (H) 04/09/2021   TRIG 130 04/09/2021   CHOLHDL 4.6 (H) 04/09/2021   Last hemoglobin A1c Lab Results  Component Value Date   HGBA1C 6.0 (H) 04/09/2021   Last thyroid functions Lab Results  Component Value Date   TSH 0.529 04/09/2021   Last vitamin D Lab Results  Component Value Date   VD25OH 84.7 03/18/2020       Objective     Today's Vitals   12/12/21 0958 12/12/21 1033  BP: (!) 157/83 116/79  Pulse: 100   SpO2: 97%   Weight: 193 lb 6.4 oz (87.7 kg)   Height: 5' 3"  (1.6 m)    Body  mass index is 34.26 kg/m.   BP Readings from Last 3 Encounters:  12/12/21 116/79  06/19/21 135/81  04/09/21 (!) 141/84    Wt Readings from Last 3 Encounters:  12/12/21 193 lb 6.4 oz (87.7 kg)  06/19/21 198 lb 6.4 oz (90 kg)  04/09/21 196 lb (88.9 kg)    Physical Exam Vitals and nursing note reviewed.  Constitutional:      Appearance: Normal appearance. She is well-developed.  HENT:     Head: Normocephalic and atraumatic.     Nose: Nose normal.     Mouth/Throat:     Mouth: Mucous membranes are moist.     Pharynx: Oropharynx is clear.  Eyes:  Extraocular Movements: Extraocular movements intact.     Conjunctiva/sclera: Conjunctivae normal.     Pupils: Pupils are equal, round, and reactive to light.  Cardiovascular:     Rate and Rhythm: Normal rate and regular rhythm.     Pulses: Normal pulses.     Heart sounds: Normal heart sounds.  Pulmonary:     Effort: Pulmonary effort is normal.     Breath sounds: Normal breath sounds.  Abdominal:     Palpations: Abdomen is soft.  Musculoskeletal:        General: Normal range of motion.     Cervical back: Normal range of motion and neck supple.     Comments: There is moderate lower back pain with palpation. There are no bony abnormalities or deformities noted. There is tenderness of the left hip and left upper leg. She is unable to lift the left leg.  Plantar flexion and dorsiflexion are difficult due to weakness.   Lymphadenopathy:     Cervical: No cervical adenopathy.  Skin:    General: Skin is warm and dry.     Capillary Refill: Capillary refill takes less than 2 seconds.  Neurological:     General: No focal deficit present.     Mental Status: She is alert and oriented to person, place, and time.     Motor: Weakness present.     Gait: Gait abnormal.     Comments:  Dorsiflexion and plantar flexion ability is weak on the left side.    Psychiatric:        Mood and Affect: Mood normal.        Behavior: Behavior normal.         Thought Content: Thought content normal.        Judgment: Judgment normal.       Assessment & Plan    1. Essential hypertension Stable. Continue BP medication as prescribed   2. Primary insomnia May continue to take ambien 10 mg at bedtime as needed for insomnia. We reviewed risk factors and possible long term effects of taking this medication for long period of time. She voices understanding.  - zolpidem (AMBIEN) 10 MG tablet; Take 1 tablet (10 mg total) by mouth at bedtime as needed. for sleep  Dispense: 30 tablet; Refill: 3  3. Primary osteoarthritis of knees, bilateral May use voltaren gel on effected areas up  to four times daily as needed for pain.  - diclofenac Sodium (VOLTAREN) 1 % GEL; Apply 4 g topically 4 (four) times daily as needed.  Dispense: 100 g; Refill: 5  4. Restless legs This is likely related to lumbosacral spondylosis. Got first epidural steroid injection this past Monday. Advised she continue with previously prescribed gabapentin. Discuss this further with neurosurgeonsat next visit.   5. BMI 34.0-34.9, adult Discussed lowering calorie intake to 1500 calories per day and incorporating exercise into daily routine to help lose weight.        Return in about 4 months (around 04/13/2022) for medicare wellness, FBW at time of visit.         Ronnell Freshwater, NP  [facility] at [facility] [phone] (phone) [phone] (fax)  [place]

## 2021-12-19 ENCOUNTER — Other Ambulatory Visit: Payer: Self-pay | Admitting: Nurse Practitioner

## 2021-12-19 DIAGNOSIS — G2581 Restless legs syndrome: Secondary | ICD-10-CM

## 2021-12-24 ENCOUNTER — Encounter: Payer: Self-pay | Admitting: Nurse Practitioner

## 2021-12-28 DIAGNOSIS — Z6824 Body mass index (BMI) 24.0-24.9, adult: Secondary | ICD-10-CM | POA: Insufficient documentation

## 2021-12-28 DIAGNOSIS — G2581 Restless legs syndrome: Secondary | ICD-10-CM | POA: Insufficient documentation

## 2022-01-02 ENCOUNTER — Other Ambulatory Visit: Payer: Self-pay | Admitting: Nurse Practitioner

## 2022-01-02 DIAGNOSIS — I10 Essential (primary) hypertension: Secondary | ICD-10-CM

## 2022-01-12 ENCOUNTER — Other Ambulatory Visit: Payer: Self-pay | Admitting: Nurse Practitioner

## 2022-01-12 DIAGNOSIS — G2581 Restless legs syndrome: Secondary | ICD-10-CM

## 2022-01-13 ENCOUNTER — Other Ambulatory Visit: Payer: Self-pay | Admitting: Nurse Practitioner

## 2022-01-13 DIAGNOSIS — E785 Hyperlipidemia, unspecified: Secondary | ICD-10-CM

## 2022-01-19 ENCOUNTER — Other Ambulatory Visit: Payer: Self-pay | Admitting: Nurse Practitioner

## 2022-01-19 DIAGNOSIS — G2581 Restless legs syndrome: Secondary | ICD-10-CM

## 2022-01-22 ENCOUNTER — Other Ambulatory Visit: Payer: Self-pay | Admitting: Nurse Practitioner

## 2022-01-22 DIAGNOSIS — Z1231 Encounter for screening mammogram for malignant neoplasm of breast: Secondary | ICD-10-CM

## 2022-02-19 ENCOUNTER — Other Ambulatory Visit: Payer: Self-pay | Admitting: Nurse Practitioner

## 2022-02-19 ENCOUNTER — Ambulatory Visit
Admission: RE | Admit: 2022-02-19 | Discharge: 2022-02-19 | Disposition: A | Discharge status: Home / Self Care | Payer: PPO | Source: Ambulatory Visit | Attending: Nurse Practitioner | Admitting: Nurse Practitioner

## 2022-02-19 DIAGNOSIS — Z1231 Encounter for screening mammogram for malignant neoplasm of breast: Secondary | ICD-10-CM

## 2022-02-23 ENCOUNTER — Encounter: Payer: Self-pay | Admitting: Nurse Practitioner

## 2022-03-05 DIAGNOSIS — H16042 Marginal corneal ulcer, left eye: Secondary | ICD-10-CM | POA: Diagnosis not present

## 2022-03-06 DIAGNOSIS — M5136 Other intervertebral disc degeneration, lumbar region: Secondary | ICD-10-CM | POA: Diagnosis not present

## 2022-03-06 DIAGNOSIS — M5416 Radiculopathy, lumbar region: Secondary | ICD-10-CM | POA: Diagnosis not present

## 2022-03-06 DIAGNOSIS — M5126 Other intervertebral disc displacement, lumbar region: Secondary | ICD-10-CM | POA: Diagnosis not present

## 2022-03-08 ENCOUNTER — Other Ambulatory Visit: Payer: Self-pay | Admitting: Nurse Practitioner

## 2022-03-08 DIAGNOSIS — I10 Essential (primary) hypertension: Secondary | ICD-10-CM

## 2022-03-11 DIAGNOSIS — H16042 Marginal corneal ulcer, left eye: Secondary | ICD-10-CM | POA: Diagnosis not present

## 2022-04-07 ENCOUNTER — Encounter: Payer: Self-pay | Admitting: Physical Therapy

## 2022-04-07 ENCOUNTER — Ambulatory Visit: Payer: PPO | Attending: Family Medicine

## 2022-04-07 DIAGNOSIS — M5442 Lumbago with sciatica, left side: Secondary | ICD-10-CM | POA: Diagnosis not present

## 2022-04-07 DIAGNOSIS — R262 Difficulty in walking, not elsewhere classified: Secondary | ICD-10-CM | POA: Diagnosis not present

## 2022-04-07 DIAGNOSIS — G8929 Other chronic pain: Secondary | ICD-10-CM

## 2022-04-07 DIAGNOSIS — M6281 Muscle weakness (generalized): Secondary | ICD-10-CM

## 2022-04-07 NOTE — Therapy (Signed)
OUTPATIENT PHYSICAL THERAPY THORACOLUMBAR EVALUATION   Patient Name: RAYLYN CARTON MRN: [id number] DOB:[birth year], 71 y.o., female Today's Date: 04/07/2022  END OF SESSION:  PT End of Session - 04/07/22 1028     Visit Number 1    Number of Visits 17    Date for PT Re-Evaluation 06/02/22    PT Start Time 0845    PT Stop Time 0929    PT Time Calculation (min) 44 min    Activity Tolerance Patient tolerated treatment well    Behavior During Therapy WFL for tasks assessed/performed             Past Medical History:  Diagnosis Date   Cancer ([place])    lt br cancer   Edema leg    with chemo   GERD (gastroesophageal reflux disease)    also, diverticulosis   History of kidney stones    Hyperlipidemia    Hypertension    Neuropathy    hands and feet post chemo   Wears glasses    Past Surgical History:  Procedure Laterality Date   ABDOMINAL HYSTERECTOMY  1986   AXILLARY LYMPH NODE DISSECTION Left 06/26/2014   Procedure: AXILLARY LYMPH NODE DISSECTION;  Surgeon: Alphonsa Overall, MD;  Location: [facility];  Service: General;  Laterality: Left;   [place]   lumbar   BREAST SURGERY Left 06/2014   CATARACT EXTRACTION W/PHACO Right 03/23/2019   Procedure: CATARACT EXTRACTION PHACO AND INTRAOCULAR LENS PLACEMENT ([facility]) RIGHT VISION BLUE;  Surgeon: Marchia Meiers, MD;  Location: [facility];  Service: Ophthalmology;  Laterality: Right;  [place] 00:55.4 CDE 5.92 Fluid Pack lot # [id number]   COLONOSCOPY     COLONOSCOPY WITH PROPOFOL N/A 12/29/2017   Procedure: COLONOSCOPY WITH PROPOFOL;  Surgeon: Toledo, Benay Pike, MD;  Location: [facility];  Service: Gastroenterology;  Laterality: N/A;   CYSTOSCOPY/URETEROSCOPY/HOLMIUM LASER/STENT PLACEMENT Bilateral 07/28/2019   Procedure: CYSTOSCOPY/URETEROSCOPY/HOLMIUM LASER/STENT PLACEMENT;  Surgeon: Billey Co, MD;  Location: [facility];  Service: Urology;  Laterality: Bilateral;   DENTAL SURGERY Right    top     DILATION AND CURETTAGE OF UTERUS     LAPAROSCOPIC GASTRIC BANDING  10/19/05   MASTECTOMY Left 2016   MASTECTOMY MODIFIED RADICAL Left 06/26/2014   Procedure: MASTECTOMY MODIFIED RADICAL;  Surgeon: Alphonsa Overall, MD;  Location: [facility];  Service: General;  Laterality: Left;   PORT A CATH REVISION     insertion-10/15   PORT-A-CATH REMOVAL N/A 02/07/2015   Procedure: MINOR REMOVAL PORT-A-CATH;  Surgeon: Alphonsa Overall, MD;  Location: [facility];  Service: General;  Laterality: N/A;   Patient Active Problem List   Diagnosis Date Noted   Restless legs 12/28/2021   BMI 24.0-24.9, adult 12/28/2021   Fall at home 06/28/2021   Lumbosacral radiculopathy 06/28/2021   Pain in unspecified hip 06/28/2021   Peroneal tendonitis of right lower extremity 04/09/2021   Dizziness 11/10/2020   Other fatigue 11/10/2020   History of iron deficiency anemia 11/10/2020   Impaired fasting glucose 11/10/2020   Leg cramps 11/10/2020   Encounter for screening mammogram for malignant neoplasm of breast 11/10/2020   Encounter to establish care 08/11/2020   Tooth, impacted 08/11/2020   Hyperlipidemia 04/22/2020   History of left breast cancer 12/26/2019   Multinodular goiter 12/24/2019   Lower abdominal pain 07/03/2019   Low back pain 12/11/2018   Cough 07/04/2018   Right sided sciatica 05/29/2018   Stomatitis and mucositis 11/03/2017   Dysuria 10/14/2017  Encounter for general adult medical examination with abnormal findings 10/14/2017   Essential hypertension 10/14/2017   Primary insomnia 10/14/2017   Primary osteoarthritis of knees, bilateral 10/14/2017   Neuropathy due to chemotherapeutic drug ([place]) 11/07/2014   Constipation 02/16/2014   Breast cancer of upper-inner quadrant of left female breast ([place]) 01/19/2014   History of laparoscopic adjustable gastric banding, 10/19/2005. 02/03/2013    PCP: Leretha Pol, NP  REFERRING PROVIDER: Meeler, Whitney FNP  REFERRING  DIAG: Lumbar Radiculitis, DDD  Rationale for Evaluation and Treatment: Rehabilitation  THERAPY DIAG:  Chronic left-sided low back pain with left-sided sciatica  Muscle weakness (generalized)  Difficulty in walking, not elsewhere classified  ONSET DATE: Onset ~4-5 months ago  SUBJECTIVE:                                                                                                                                                                                           SUBJECTIVE STATEMENT: See pertinent history   PERTINENT HISTORY:  Pt is a 71 y.o. female referred to PT for lumbar radiculitis. Pt reports onset of  L sided LBP with radiculopathy with gradual onset. Pain travels from hip along posterior aspect of LLE to distal femur above the knee. Pain described as sharp, tingling. Painful activities include sitting, standing, walking. Has had balance issues with stumbling into TV. Denies any falls. Has had multiple stumbles. Worst pain 10/10, best pain 6-7/10 NPS. Currently 7/10 NPS. Pt has relief with heat, R side lying. Reports numbness in plantar surface of L foot that increases with laying supine or sitting. Pt reports inability to complete community activities due to her pain and balance.   PAIN:  Are you having pain? Yes: NPRS scale: 7/10 Pain location: L low back and posterior thigh on L above knee Pain description: Burning, sharp Aggravating factors: standing, sitting, walking Relieving factors: R side lying  PRECAUTIONS: None  WEIGHT BEARING RESTRICTIONS: No  FALLS:  Has patient fallen in last 6 months? No  LIVING ENVIRONMENT: Lives with: lives with their family and lives with their spouse Lives in: House/apartment Stairs: Yes: Internal: 6-7 steps; on left going up and External: 3 steps; none Has following equipment at home: None  OCCUPATION: Retired  PLOF: Independent  PATIENT GOALS: Walk and feel stable, improve pain  NEXT MD VISIT:   OBJECTIVE:    DIAGNOSTIC FINDINGS:  CLINICAL DATA:  Spinal stenosis of lumbosacral region M48.07 (ICD-10-CM). Lumbar spondylosis M47.816 (ICD-10-CM).   EXAM: MRI LUMBAR SPINE WITHOUT CONTRAST   TECHNIQUE: Multiplanar, multisequence MR imaging of the lumbar spine was performed. No intravenous contrast was administered.   COMPARISON:  Radiographs June 19, 2021.   FINDINGS: Segmentation:  Standard.   Alignment:  Trace anterolisthesis at L4-5.   Vertebrae:  No fracture, evidence of discitis, or bone lesion.   Conus medullaris and cauda equina: Conus extends to the T12 level. Conus and cauda equina appear normal.   Paraspinal and other soft tissues: Postsurgical changes in the posterior paraspinal soft tissues.   Disc levels:   T12-L1: No spinal canal or neural foraminal stenosis.   L1-2: Shallow disc bulge and mild facet degenerative changes without significant spinal canal or neural foraminal stenosis.   L2-3: Shallow disc bulge and mild-to-moderate facet degenerative changes with ligamentum flavum redundancy without significant spinal or neural foraminal stenosis.   L3-4: Disc bulge with superimposed small right foraminal disc protrusion, moderate hypertrophic facet degenerative changes and ligamentum flavum redundancy resulting in mild spinal canal stenosis and mild right neural foraminal narrowing.   L4-5: Disc bulge, moderate to advanced hypertrophic facet degenerative changes with trace left facet effusion and ligamentum flavum redundancy resulting in mild spinal canal Sten. No significant neural.   L5-S1: Postsurgical changes from left laminotomy. Shallow disc bulge with a left foraminal/far lateral disc osteophyte complex and mild facet degenerative changes resulting in mild left neural foraminal. No significant spinal canal stenosis.   IMPRESSION: 1. Degenerative changes of the lumbar spine, more pronounced at the level of the facet joints at L3-4 and L4-5. No  high-grade spinal canal or neural foraminal stenosis at any level.     Electronically Signed   By: Pedro Earls M.D.   On: 11/10/2021 10:00  PATIENT SURVEYS:  FOTO Deferred to next session  SCREENING FOR RED FLAGS: Bowel or bladder incontinence: No Spinal tumors: No Cauda equina syndrome: No Compression fracture: No Abdominal aneurysm: No  COGNITION: Overall cognitive status: Within functional limits for tasks assessed     SENSATION: WFL   POSTURE: rounded shoulders, forward head, and increased lumbar lordosis  PALPATION: TTP along piriformis on LLE and L sided lumbar paraspinals L5-L1  LUMBAR ROM:   AROM eval  Flexion 75%*  Extension 100%  Right lateral flexion 75%  Left lateral flexion 75%  Right rotation 100%  Left rotation 100%   (Blank rows = not tested)  LOWER EXTREMITY ROM:     Active  Right eval Left eval  Hip flexion 90 90  Hip extension    Hip abduction    Hip adduction    Hip internal rotation 32 25*  Hip external rotation WNL WNL  Knee flexion    Knee extension    Ankle dorsiflexion    Ankle plantarflexion    Ankle inversion    Ankle eversion     (Blank rows = not tested)  LOWER EXTREMITY MMT:    MMT Right eval Left eval  Hip flexion 4/5 4/5  Hip extension    Hip abduction 4/5 4/5  Hip adduction    Hip internal rotation 5/5 4/5*  Hip external rotation 5/5 5/5  Knee flexion 5/5 5/5  Knee extension 5/5 4-/5  Ankle dorsiflexion 5/5 4-/5  Ankle plantarflexion 5/5 5/5  Ankle inversion    Ankle eversion     (Blank rows = not tested)  LUMBAR SPECIAL TESTS:  Straight leg raise test: Positive, Slump test: Positive, SI Compression/distraction test: Negative, and FABER test: Negative  FADIR positive on LLE  FAIR test positive on LLE  Repeated lumbar extension: positive with centralization of symptoms 15-20 reps  FUNCTIONAL TESTS:  5 times sit to stand: 28.96 seconds 10 meter  walk test: .6 m/s  GAIT: Distance  walked: 10 meters Assistive device utilized: None Level of assistance: Complete Independence Comments: Mild antalgic gait on LLE with decreased stance time, hip extension in terminal stance.   TODAY'S TREATMENT:                                                                                                                              DATE: 04/07/22   Provided HEP with education on reps, sets, frequency  PATIENT EDUCATION:  Education details: Prognosis, HEP (reps/sets/frequency) Person educated: Patient Education method: Explanation, Demonstration, and Handouts Education comprehension: verbalized understanding and needs further education  HOME EXERCISE PROGRAM: Access Code: [email or web address] URL: [email or web address] Date: 04/07/2022 Prepared by: Larna Daughters  Exercises - Standing Lumbar Extension  - 1 x daily - 7 x weekly - 3 sets - 20 reps - Seated Slump Nerve Glide  - 1 x daily - 7 x weekly - 2 sets - 20 reps  ASSESSMENT:  CLINICAL IMPRESSION: Patient is a 71 y.o. female who was seen today for physical therapy evaluation and treatment for L sided LBP and radiculopathy. Pt presents with impairments in positive slump, and L4-L5 weakness in quad and ankle DF indicative of lumbar radiculopathy. Pt also has impairments with painful lumbar/hip mobility indicative of neural tension and piriformis involvement. Pt demonstrates relief of symptoms with repeated lumbar extension and nerve flossing techniques. Due to quad weakness, pain and decreased gait speed, these impairments are limiting pt in safe participation in community ADL completion and also increasing pt's risk for falls and injury. Pt will benefit from skilled PT services to address these impairments to reduce falls risk and decrease pain to return pt to PLOF.   OBJECTIVE IMPAIRMENTS: Abnormal gait, decreased activity tolerance, decreased balance, decreased mobility, difficulty walking, decreased ROM, decreased strength,  impaired flexibility, obesity, and pain.   ACTIVITY LIMITATIONS: bending, sitting, standing, squatting, sleeping, bed mobility, bathing, dressing, and locomotion level  PARTICIPATION LIMITATIONS: cleaning, driving, shopping, community activity, and church  PERSONAL FACTORS: Age, Past/current experiences, Time since onset of injury/illness/exacerbation, and 3+ comorbidities: Hx of breast cancer, HLD, HTN, neuropathy  are also affecting patient's functional outcome.   REHAB POTENTIAL: Good  CLINICAL DECISION MAKING: Stable/uncomplicated  EVALUATION COMPLEXITY: Low   GOALS: Goals reviewed with patient? No  SHORT TERM GOALS: Target date: 05/05/22  Pt will be independent with HEP to improve LBP and radicular symptoms for ADL completion. Baseline: 04/07/22: given HEP Goal status: INITIAL  LONG TERM GOALS: Target date: 06/02/22  Pt will improve FOTO to target score to demonstrate clinically significant improvement in functional mobility Baseline: 04/07/22: Deferred to next session Goal status: INITIAL  2.  Pt will improve 5xSTS to 12.6 seconds to meet age matched norms to demonstrate reduced falls risk and improved LE strength for standing and walking tasks Baseline: 04/07/22: 28.96 sec Goal status: INITIAL  3.  Pt will report worst LBP as a 4/10 NPS with standing, sitting, and  walking ADL's to demonstrate clinically significant reduction in LBP. Baseline: 04/07/22: 7/10 NPS Goal status: INITIAL  4.  Pt will improve 10 m gait speed >/= to 1.0 m/s to demonstrate ability to safely complete household distances with reduced risk of falls.  Baseline: 04/07/22: .6 m/s Goal status: INITIAL PLAN:  PT FREQUENCY: 2x/week  PT DURATION: 8 weeks  PLANNED INTERVENTIONS: Therapeutic exercises, Therapeutic activity, Neuromuscular re-education, Balance training, Gait training, Patient/Family education, Self Care, Joint mobilization, Joint manipulation, Stair training, Aquatic Therapy, Dry Needling,  Electrical stimulation, Spinal manipulation, Spinal mobilization, Cryotherapy, Moist heat, and Manual therapy.  PLAN FOR NEXT SESSION: FOTO, 6MWT, reassess HEP, LLE strengthening/lumbar mobility.   [place] Caster. Fairly IV, PT, DPT Physical Therapist- [facility]  04/07/2022, 10:30 AM

## 2022-04-09 DIAGNOSIS — M5416 Radiculopathy, lumbar region: Secondary | ICD-10-CM | POA: Diagnosis not present

## 2022-04-09 DIAGNOSIS — M5126 Other intervertebral disc displacement, lumbar region: Secondary | ICD-10-CM | POA: Diagnosis not present

## 2022-04-13 ENCOUNTER — Ambulatory Visit: Payer: PPO

## 2022-04-13 ENCOUNTER — Encounter: Payer: Self-pay | Admitting: Physical Therapy

## 2022-04-13 DIAGNOSIS — R262 Difficulty in walking, not elsewhere classified: Secondary | ICD-10-CM

## 2022-04-13 DIAGNOSIS — M6281 Muscle weakness (generalized): Secondary | ICD-10-CM

## 2022-04-13 DIAGNOSIS — M5442 Lumbago with sciatica, left side: Secondary | ICD-10-CM | POA: Diagnosis not present

## 2022-04-13 DIAGNOSIS — G8929 Other chronic pain: Secondary | ICD-10-CM

## 2022-04-13 NOTE — Therapy (Signed)
OUTPATIENT PHYSICAL THERAPY THORACOLUMBAR TREATMENT   Patient Name: DAESIA ZYLKA MRN: [id number] DOB:Aug 24, 1950, 71 y.o., female Today's Date: 04/13/2022  END OF SESSION:  PT End of Session - 04/13/22 0759     Visit Number 2    Number of Visits 17    Date for PT Re-Evaluation 06/02/22    PT Start Time 0801    PT Stop Time 0845    PT Time Calculation (min) 44 min    Activity Tolerance Patient tolerated treatment well    Behavior During Therapy WFL for tasks assessed/performed             Past Medical History:  Diagnosis Date   Cancer ([place])    lt br cancer   Edema leg    with chemo   GERD (gastroesophageal reflux disease)    also, diverticulosis   History of kidney stones    Hyperlipidemia    Hypertension    Neuropathy    hands and feet post chemo   Wears glasses    Past Surgical History:  Procedure Laterality Date   ABDOMINAL HYSTERECTOMY  1986   AXILLARY LYMPH NODE DISSECTION Left 06/26/2014   Procedure: AXILLARY LYMPH NODE DISSECTION;  Surgeon: Alphonsa Overall, MD;  Location: [place];  Service: General;  Laterality: Left;   [place]   lumbar   BREAST SURGERY Left 06/2014   CATARACT EXTRACTION W/PHACO Right 03/23/2019   Procedure: CATARACT EXTRACTION PHACO AND INTRAOCULAR LENS PLACEMENT ([place]) RIGHT VISION BLUE;  Surgeon: Marchia Meiers, MD;  Location: [facility];  Service: Ophthalmology;  Laterality: Right;  [place] 00:55.4 CDE 5.92 Fluid Pack lot # [id number]   COLONOSCOPY     COLONOSCOPY WITH PROPOFOL N/A 12/29/2017   Procedure: COLONOSCOPY WITH PROPOFOL;  Surgeon: Tol[place], Benay Pike, MD;  Location: [facility];  Service: Gastroenterology;  Laterality: N/A;   CYSTOSCOPY/URETEROSCOPY/HOLMIUM LASER/STENT PLACEMENT Bilateral 07/28/2019   Procedure: CYSTOSCOPY/URETEROSCOPY/HOLMIUM LASER/STENT PLACEMENT;  Surgeon: Billey Co, MD;  Location: [facility];  Service: Urology;  Laterality: Bilateral;   DENTAL SURGERY Right    top     DILATION AND CURETTAGE OF UTERUS     LAPAROSCOPIC GASTRIC BANDING  10/19/05   MASTECTOMY Left 2016   MASTECTOMY MODIFIED RADICAL Left 06/26/2014   Procedure: MASTECTOMY MODIFIED RADICAL;  Surgeon: Alphonsa Overall, MD;  Location: [place];  Service: General;  Laterality: Left;   PORT A CATH REVISION     insertion-10/15   PORT-A-CATH REMOVAL N/A 02/07/2015   Procedure: MINOR REMOVAL PORT-A-CATH;  Surgeon: Alphonsa Overall, MD;  Location: [place];  Service: General;  Laterality: N/A;   Patient Active Problem List   Diagnosis Date Noted   Restless legs 12/28/2021   BMI 24.0-24.9, adult 12/28/2021   Fall at home 06/28/2021   Lumbosacral radiculopathy 06/28/2021   Pain in unspecified hip 06/28/2021   Peroneal tendonitis of right lower extremity 04/09/2021   Dizziness 11/10/2020   Other fatigue 11/10/2020   History of iron deficiency anemia 11/10/2020   Impaired fasting glucose 11/10/2020   Leg cramps 11/10/2020   Encounter for screening mammogram for malignant neoplasm of breast 11/10/2020   Encounter to establish care 08/11/2020   Tooth, impacted 08/11/2020   Hyperlipidemia 04/22/2020   History of left breast cancer 12/26/2019   Multinodular goiter 12/24/2019   Lower abdominal pain 07/03/2019   Low back pain 12/11/2018   Cough 07/04/2018   Right sided sciatica 05/29/2018   Stomatitis and mucositis 11/03/2017   Dysuria 10/14/2017  Encounter for general adult medical examination with abnormal findings 10/14/2017   Essential hypertension 10/14/2017   Primary insomnia 10/14/2017   Primary osteoarthritis of knees, bilateral 10/14/2017   Neuropathy due to chemotherapeutic drug ([place]) 11/07/2014   Constipation 02/16/2014   Breast cancer of upper-inner quadrant of left female breast ([place]) 01/19/2014   History of laparoscopic adjustable gastric banding, 10/19/2005. 02/03/2013    PCP: Leretha Pol, NP  REFERRING PROVIDER: Meeler, Whitney FNP  REFERRING  DIAG: Lumbar Radiculitis, DDD  Rationale for Evaluation and Treatment: Rehabilitation  THERAPY DIAG:  Chronic left-sided low back pain with left-sided sciatica  Muscle weakness (generalized)  Difficulty in walking, not elsewhere classified  ONSET DATE: Onset ~4-5 months ago  SUBJECTIVE:                                                                                                                                                                                           SUBJECTIVE STATEMENT: Pt repotrs getting steroid injections in her lower back since last week. HEP compliance with pain relief since injections and engaging in HEP. Currently pain at a 3/10 NPS.   PERTINENT HISTORY:  Pt is a 71 y.o. female referred to PT for lumbar radiculitis. Pt reports onset of  L sided LBP with radiculopathy with gradual onset. Pain travels from hip along posterior aspect of LLE to distal femur above the knee. Pain described as sharp, tingling. Painful activities include sitting, standing, walking. Has had balance issues with stumbling into TV. Denies any falls. Has had multiple stumbles. Worst pain 10/10, best pain 6-7/10 NPS. Currently 7/10 NPS. Pt has relief with heat, R side lying. Reports numbness in plantar surface of L foot that increases with laying supine or sitting. Pt reports inability to complete community activities due to her pain and balance.   PAIN:  Are you having pain? Yes: NPRS scale: 3/10 Pain location: L low back and posterior thigh on L above knee Pain description: Burning, sharp Aggravating factors: standing, sitting, walking Relieving factors: R side lying  PRECAUTIONS: None  WEIGHT BEARING RESTRICTIONS: No  FALLS:  Has patient fallen in last 6 months? No  LIVING ENVIRONMENT: Lives with: lives with their family and lives with their spouse Lives in: House/apartment Stairs: Yes: Internal: 6-7 steps; on left going up and External: 3 steps; none Has following equipment at  home: None  OCCUPATION: Retired  PLOF: Independent  PATIENT GOALS: Walk and feel stable, improve pain  NEXT MD VISIT:   OBJECTIVE:   DIAGNOSTIC FINDINGS:  CLINICAL DATA:  Spinal stenosis of lumbosacral region M48.07 (ICD-10-CM). Lumbar spondylosis M47.816 (ICD-10-CM).   EXAM: MRI LUMBAR SPINE WITHOUT  CONTRAST   TECHNIQUE: Multiplanar, multisequence MR imaging of the lumbar spine was performed. No intravenous contrast was administered.   COMPARISON:  Radiographs June 19, 2021.   FINDINGS: Segmentation:  Standard.   Alignment:  Trace anterolisthesis at L4-5.   Vertebrae:  No fracture, evidence of discitis, or bone lesion.   Conus medullaris and cauda equina: Conus extends to the T12 level. Conus and cauda equina appear normal.   Paraspinal and other soft tissues: Postsurgical changes in the posterior paraspinal soft tissues.   Disc levels:   T12-L1: No spinal canal or neural foraminal stenosis.   L1-2: Shallow disc bulge and mild facet degenerative changes without significant spinal canal or neural foraminal stenosis.   L2-3: Shallow disc bulge and mild-to-moderate facet degenerative changes with ligamentum flavum redundancy without significant spinal or neural foraminal stenosis.   L3-4: Disc bulge with superimposed small right foraminal disc protrusion, moderate hypertrophic facet degenerative changes and ligamentum flavum redundancy resulting in mild spinal canal stenosis and mild right neural foraminal narrowing.   L4-5: Disc bulge, moderate to advanced hypertrophic facet degenerative changes with trace left facet effusion and ligamentum flavum redundancy resulting in mild spinal canal Sten. No significant neural.   L5-S1: Postsurgical changes from left laminotomy. Shallow disc bulge with a left foraminal/far lateral disc osteophyte complex and mild facet degenerative changes resulting in mild left neural foraminal. No significant spinal canal  stenosis.   IMPRESSION: 1. Degenerative changes of the lumbar spine, more pronounced at the level of the facet joints at L3-4 and L4-5. No high-grade spinal canal or neural foraminal stenosis at any level.     Electronically Signed   By: Pedro Earls M.D.   On: 11/10/2021 10:00  PATIENT SURVEYS:  FOTO 04/13/22: 34 with target score of 50  SCREENING FOR RED FLAGS: Bowel or bladder incontinence: No Spinal tumors: No Cauda equina syndrome: No Compression fracture: No Abdominal aneurysm: No  COGNITION: Overall cognitive status: Within functional limits for tasks assessed     SENSATION: WFL   POSTURE: rounded shoulders, forward head, and increased lumbar lordosis  PALPATION: TTP along piriformis on LLE and L sided lumbar paraspinals L5-L1  LUMBAR ROM:   AROM eval  Flexion 75%*  Extension 100%  Right lateral flexion 75%  Left lateral flexion 75%  Right rotation 100%  Left rotation 100%   (Blank rows = not tested)  LOWER EXTREMITY ROM:     Active  Right eval Left eval  Hip flexion 90 90  Hip extension    Hip abduction    Hip adduction    Hip internal rotation 32 25*  Hip external rotation WNL WNL  Knee flexion    Knee extension    Ankle dorsiflexion    Ankle plantarflexion    Ankle inversion    Ankle eversion     (Blank rows = not tested)  LOWER EXTREMITY MMT:    MMT Right eval Left eval  Hip flexion 4/5 4/5  Hip extension    Hip abduction 4/5 4/5  Hip adduction    Hip internal rotation 5/5 4/5*  Hip external rotation 5/5 5/5  Knee flexion 5/5 5/5  Knee extension 5/5 4-/5  Ankle dorsiflexion 5/5 4-/5  Ankle plantarflexion 5/5 5/5  Ankle inversion    Ankle eversion     (Blank rows = not tested)  LUMBAR SPECIAL TESTS:  Straight leg raise test: Positive, Slump test: Positive, SI Compression/distraction test: Negative, and FABER test: Negative  FADIR positive on LLE  FAIR test positive on LLE  Repeated lumbar extension:  positive with centralization of symptoms 15-20 reps  FUNCTIONAL TESTS:  5 times sit to stand: 28.96 seconds 10 meter walk test: .6 m/s  GAIT: Distance walked: 10 meters Assistive device utilized: None Level of assistance: Complete Independence Comments: Mild antalgic gait on LLE with decreased stance time, hip extension in terminal stance.   TODAY'S TREATMENT:                                                                                                                              DATE: 04/13/22   There.ex: FOTO: 34 with target of 50   6 MWT: 1,106'. Pain up to 5/10 NPS. 7th decade female norm is 1,554'.    [facility] PT Assessment - 04/13/22 0821       Functional Gait  Assessment   Gait assessed  Yes    Gait Level Surface Walks 20 ft, slow speed, abnormal gait pattern, evidence for imbalance or deviates 10-15 in outside of the 12 in walkway width. Requires more than 7 sec to ambulate 20 ft.    Change in Gait Speed Able to change speed, demonstrates mild gait deviations, deviates 6-10 in outside of the 12 in walkway width, or no gait deviations, unable to achieve a major change in velocity, or uses a change in velocity, or uses an assistive device.    Gait with Horizontal Head Turns Performs head turns smoothly with slight change in gait velocity (eg, minor disruption to smooth gait path), deviates 6-10 in outside 12 in walkway width, or uses an assistive device.    Gait with Vertical Head Turns Performs task with slight change in gait velocity (eg, minor disruption to smooth gait path), deviates 6 - 10 in outside 12 in walkway width or uses assistive device    Gait and Pivot Turn Pivot turns safely within 3 sec and stops quickly with no loss of balance.    Step Over Obstacle Is able to step over one shoe box (4.5 in total height) without changing gait speed. No evidence of imbalance.    Gait with Narrow Base of Support Ambulates 4-7 steps.    Gait with Eyes Closed Walks 20 ft, uses  assistive device, slower speed, mild gait deviations, deviates 6-10 in outside 12 in walkway width. Ambulates 20 ft in less than 9 sec but greater than 7 sec.    Ambulating Backwards Walks 20 ft, uses assistive device, slower speed, mild gait deviations, deviates 6-10 in outside 12 in walkway width.    Steps Two feet to a stair, must use rail.    Total Score 18             Discussion on increased falls risk based off of FGA score as seen above.   Repeated lumbar extension: x15   Seated nerve glides on LLE floss technique. X15, initial mod, multimodal cuing for form/technique.   Seated LLE figure 4: 2x30  sec   STS x5 with SUE support   Seated toe raises for ankle DF strengthening: x20     PATIENT EDUCATION:  Education details: form/technique with exercise. Falls risk based off of 6 MWT and FGA.  Person educated: Patient Education method: Explanation, Demonstration, and Handouts Education comprehension: verbalized understanding and needs further education  HOME EXERCISE PROGRAM: Access Code: [email or web address] URL: [email or web address] Date: 04/07/2022 Prepared by: Larna Daughters  Exercises - Standing Lumbar Extension  - 1 x daily - 7 x weekly - 3 sets - 20 reps - Seated Slump Nerve Glide  - 1 x daily - 7 x weekly - 2 sets - 20 reps  Access Code: [email or web address] URL: [email or web address] Date: 04/13/2022 Prepared by: Larna Daughters  Exercises - Standing Lumbar Extension  - 1 x daily - 7 x weekly - 3 sets - 20 reps - Seated Slump Nerve Glide  - 1 x daily - 7 x weekly - 2 sets - 20 reps - Sit to Stand Without Arm Support  - 1 x daily - 3-4 x weekly - 3 sets - 5 reps - Seated Toe Raise  - 1 x daily - 3-4 x weekly - 3 sets - 20 reps   ASSESSMENT:  CLINICAL IMPRESSION: Pt arriving for f/u after eval. Further testing from eval performed. Pt scoring decreased 6 MWT time based off of sex and age group and scoring 18/30 on FGA. These scores are indicative of pt  being at increased risk of falls due to weakness and gait deficits from onset of LLE lumbar radiculopathy. Updated HEP to address quad and ankle DF weakness from eval. Pt will benefit from skilled PT services to address these impairments to reduce falls risk and decrease pain to return pt to PLOF.   OBJECTIVE IMPAIRMENTS: Abnormal gait, decreased activity tolerance, decreased balance, decreased mobility, difficulty walking, decreased ROM, decreased strength, impaired flexibility, obesity, and pain.   ACTIVITY LIMITATIONS: bending, sitting, standing, squatting, sleeping, bed mobility, bathing, dressing, and locomotion level  PARTICIPATION LIMITATIONS: cleaning, driving, shopping, community activity, and church  PERSONAL FACTORS: Age, Past/current experiences, Time since onset of injury/illness/exacerbation, and 3+ comorbidities: Hx of breast cancer, HLD, HTN, neuropathy  are also affecting patient's functional outcome.   REHAB POTENTIAL: Good  CLINICAL DECISION MAKING: Stable/uncomplicated  EVALUATION COMPLEXITY: Low   GOALS: Goals reviewed with patient? No  SHORT TERM GOALS: Target date: 05/05/22  Pt will be independent with HEP to improve LBP and radicular symptoms for ADL completion. Baseline: 04/07/22: given HEP Goal status: INITIAL  LONG TERM GOALS: Target date: 06/02/22  Pt will improve FOTO to target score to demonstrate clinically significant improvement in functional mobility Baseline: 04/07/22: Deferred to next session; 04/13/22: 34 with target of 50.  Goal status: INITIAL  2.  Pt will improve 5xSTS to 12.6 seconds to meet age matched norms to demonstrate reduced falls risk and improved LE strength for standing and walking tasks Baseline: 04/07/22: 28.96 sec Goal status: INITIAL  3.  Pt will report worst LBP as a 4/10 NPS with standing, sitting, and walking ADL's to demonstrate clinically significant reduction in LBP. Baseline: 04/07/22: 7/10 NPS Goal status: INITIAL  4.   Pt will improve 10 m gait speed >/= to 1.0 m/s to demonstrate ability to safely complete household distances with reduced risk of falls.  Baseline: 04/07/22: .6 m/s Goal status: INITIAL PLAN:  PT FREQUENCY: 2x/week  PT DURATION: 8 weeks  PLANNED INTERVENTIONS: Therapeutic exercises, Therapeutic activity, Neuromuscular re-education, Balance training, Gait training, Patient/Family education, Self  Care, Joint mobilization, Joint manipulation, Stair training, Aquatic Therapy, Dry Needling, Electrical stimulation, Spinal manipulation, Spinal mobilization, Cryotherapy, Moist heat, and Manual therapy.  PLAN FOR NEXT SESSION: LLE strengthening/lumbar mobility.   Salem Caster. Fairly IV, PT, DPT Physical Therapist- [facility]  04/13/2022, 9:07 AM

## 2022-04-15 ENCOUNTER — Telehealth: Payer: Self-pay

## 2022-04-15 ENCOUNTER — Other Ambulatory Visit: Payer: Self-pay | Admitting: Nurse Practitioner

## 2022-04-15 ENCOUNTER — Ambulatory Visit: Payer: PPO | Admitting: Physical Therapy

## 2022-04-15 DIAGNOSIS — F5101 Primary insomnia: Secondary | ICD-10-CM

## 2022-04-15 NOTE — Telephone Encounter (Signed)
Awesome! Thank you!

## 2022-04-15 NOTE — Telephone Encounter (Signed)
Pt is requesting a refill on   zolpidem (AMBIEN) 10 MG tablet    Pharmacy: [id number]- [place], [place] - [address]    LOV 12/12/21 ROV dont have one schedule pt had procedure on her back would like to wait before scheduling

## 2022-04-15 NOTE — Telephone Encounter (Signed)
Please let her know that I have filled this with 1 additional refill (60 days total). She should schedule office visit for refills after that. Thanks  -HB

## 2022-04-20 ENCOUNTER — Ambulatory Visit: Payer: PPO | Admitting: Physical Therapy

## 2022-04-22 ENCOUNTER — Ambulatory Visit: Payer: PPO | Admitting: Physical Therapy

## 2022-04-27 DIAGNOSIS — E7800 Pure hypercholesterolemia, unspecified: Secondary | ICD-10-CM | POA: Diagnosis not present

## 2022-04-27 DIAGNOSIS — Z7982 Long term (current) use of aspirin: Secondary | ICD-10-CM | POA: Diagnosis not present

## 2022-04-27 DIAGNOSIS — Z880 Allergy status to penicillin: Secondary | ICD-10-CM | POA: Diagnosis not present

## 2022-04-27 DIAGNOSIS — I10 Essential (primary) hypertension: Secondary | ICD-10-CM | POA: Diagnosis not present

## 2022-04-27 DIAGNOSIS — K1121 Acute sialoadenitis: Secondary | ICD-10-CM | POA: Diagnosis not present

## 2022-04-28 ENCOUNTER — Telehealth: Payer: Self-pay | Admitting: Physical Therapy

## 2022-04-28 ENCOUNTER — Ambulatory Visit: Payer: PPO | Admitting: Physical Therapy

## 2022-04-28 NOTE — Telephone Encounter (Signed)
Called pt to inquire about absence. Did not reach but left VM instructing pt to call back if missing apt and that next No Show would result in removal from schedule.

## 2022-04-30 ENCOUNTER — Ambulatory Visit: Payer: PPO | Admitting: Physical Therapy

## 2022-05-05 ENCOUNTER — Ambulatory Visit: Payer: PPO | Attending: Family Medicine | Admitting: Physical Therapy

## 2022-05-05 DIAGNOSIS — M5442 Lumbago with sciatica, left side: Secondary | ICD-10-CM | POA: Insufficient documentation

## 2022-05-05 DIAGNOSIS — G8929 Other chronic pain: Secondary | ICD-10-CM | POA: Diagnosis not present

## 2022-05-05 DIAGNOSIS — R262 Difficulty in walking, not elsewhere classified: Secondary | ICD-10-CM | POA: Diagnosis not present

## 2022-05-05 DIAGNOSIS — M6281 Muscle weakness (generalized): Secondary | ICD-10-CM | POA: Diagnosis not present

## 2022-05-05 NOTE — Therapy (Addendum)
OUTPATIENT PHYSICAL THERAPY THORACOLUMBAR TREATMENT   Patient Name: GABRIELA IRIGOYEN MRN: [id number] DOB:1950-06-22, 72 y.o., female Today's Date: 05/05/2022  END OF SESSION:  PT End of Session - 05/05/22 0933     Visit Number 3    Number of Visits 17    Date for PT Re-Evaluation 06/02/22    Authorization Time Period 06/02/22    Authorization - Visit Number 3    Authorization - Number of Visits 20    Progress Note Due on Visit 10    PT Start Time 0845    PT Stop Time 0930    PT Time Calculation (min) 45 min    Activity Tolerance Patient tolerated treatment well    Behavior During Therapy WFL for tasks assessed/performed              Past Medical History:  Diagnosis Date   Cancer ([place])    lt br cancer   Edema leg    with chemo   GERD (gastroesophageal reflux disease)    also, diverticulosis   History of kidney stones    Hyperlipidemia    Hypertension    Neuropathy    hands and feet post chemo   Wears glasses    Past Surgical History:  Procedure Laterality Date   ABDOMINAL HYSTERECTOMY  1986   AXILLARY LYMPH NODE DISSECTION Left 06/26/2014   Procedure: AXILLARY LYMPH NODE DISSECTION;  Surgeon: Alphonsa Overall, MD;  Location: [facility];  Service: General;  Laterality: Left;   [place]   lumbar   BREAST SURGERY Left 06/2014   CATARACT EXTRACTION W/PHACO Right 03/23/2019   Procedure: CATARACT EXTRACTION PHACO AND INTRAOCULAR LENS PLACEMENT ([place]) RIGHT VISION BLUE;  Surgeon: Marchia Meiers, MD;  Location: [facility];  Service: Ophthalmology;  Laterality: Right;  [place] 00:55.4 CDE 5.92 Fluid Pack lot # [id number]   COLONOSCOPY     COLONOSCOPY WITH PROPOFOL N/A 12/29/2017   Procedure: COLONOSCOPY WITH PROPOFOL;  Surgeon: [place], Benay Pike, MD;  Location: [facility];  Service: Gastroenterology;  Laterality: N/A;   CYSTOSCOPY/URETEROSCOPY/HOLMIUM LASER/STENT PLACEMENT Bilateral 07/28/2019   Procedure: CYSTOSCOPY/URETEROSCOPY/HOLMIUM LASER/STENT  PLACEMENT;  Surgeon: Billey Co, MD;  Location: [facility];  Service: Urology;  Laterality: Bilateral;   DENTAL SURGERY Right    top    DILATION AND CURETTAGE OF UTERUS     LAPAROSCOPIC GASTRIC BANDING  10/19/05   MASTECTOMY Left 2016   MASTECTOMY MODIFIED RADICAL Left 06/26/2014   Procedure: MASTECTOMY MODIFIED RADICAL;  Surgeon: Alphonsa Overall, MD;  Location: [facility];  Service: General;  Laterality: Left;   PORT A CATH REVISION     insertion-10/15   PORT-A-CATH REMOVAL N/A 02/07/2015   Procedure: MINOR REMOVAL PORT-A-CATH;  Surgeon: Alphonsa Overall, MD;  Location: [facility];  Service: General;  Laterality: N/A;   Patient Active Problem List   Diagnosis Date Noted   Restless legs 12/28/2021   BMI 24.0-24.9, adult 12/28/2021   Fall at home 06/28/2021   Lumbosacral radiculopathy 06/28/2021   Pain in unspecified hip 06/28/2021   Peroneal tendonitis of right lower extremity 04/09/2021   Dizziness 11/10/2020   Other fatigue 11/10/2020   History of iron deficiency anemia 11/10/2020   Impaired fasting glucose 11/10/2020   Leg cramps 11/10/2020   Encounter for screening mammogram for malignant neoplasm of breast 11/10/2020   Encounter to establish care 08/11/2020   Tooth, impacted 08/11/2020   Hyperlipidemia 04/22/2020   History of left breast cancer 12/26/2019   Multinodular  goiter 12/24/2019   Lower abdominal pain 07/03/2019   Low back pain 12/11/2018   Cough 07/04/2018   Right sided sciatica 05/29/2018   Stomatitis and mucositis 11/03/2017   Dysuria 10/14/2017   Encounter for general adult medical examination with abnormal findings 10/14/2017   Essential hypertension 10/14/2017   Primary insomnia 10/14/2017   Primary osteoarthritis of knees, bilateral 10/14/2017   Neuropathy due to chemotherapeutic drug ([place]) 11/07/2014   Constipation 02/16/2014   Breast cancer of upper-inner quadrant of left female breast ([place]) 01/19/2014   History of  laparoscopic adjustable gastric banding, 10/19/2005. 02/03/2013    PCP: Leretha Pol, NP  REFERRING PROVIDER: Allene Dillon FNP  REFERRING DIAG: Lumbar Radiculitis, DDD  Rationale for Evaluation and Treatment: Rehabilitation  THERAPY DIAG:  Chronic left-sided low back pain with left-sided sciatica  Muscle weakness (generalized)  Difficulty in walking, not elsewhere classified  ONSET DATE: Onset ~4-5 months ago  SUBJECTIVE:                                                                                                                                                                                           SUBJECTIVE STATEMENT: Pt states that she felt a sudden increase in her low back pain after last session and she had to call doctor to have her/him prescribe her more medication.   PERTINENT HISTORY:  Pt is a 72 y.o. female referred to PT for lumbar radiculitis. Pt reports onset of  L sided LBP with radiculopathy with gradual onset. Pain travels from hip along posterior aspect of LLE to distal femur above the knee. Pain described as sharp, tingling. Painful activities include sitting, standing, walking. Has had balance issues with stumbling into TV. Denies any falls. Has had multiple stumbles. Worst pain 10/10, best pain 6-7/10 NPS. Currently 7/10 NPS. Pt has relief with heat, R side lying. Reports numbness in plantar surface of L foot that increases with laying supine or sitting. Pt reports inability to complete community activities due to her pain and balance.   PAIN:  Are you having pain? Yes: NPRS scale: 1-2/10 Pain location: L low back and posterior thigh on L above knee Pain description: Burning, sharp Aggravating factors: standing, sitting, walking Relieving factors: R side lying  PRECAUTIONS: None  WEIGHT BEARING RESTRICTIONS: No  FALLS:  Has patient fallen in last 6 months? No  LIVING ENVIRONMENT: Lives with: lives with their family and lives with their  spouse Lives in: House/apartment Stairs: Yes: Internal: 6-7 steps; on left going up and External: 3 steps; none Has following equipment at home: None  OCCUPATION: Retired  PLOF: Independent  PATIENT GOALS: Walk and feel  stable, improve pain  NEXT MD VISIT:   OBJECTIVE:   DIAGNOSTIC FINDINGS:  CLINICAL DATA:  Spinal stenosis of lumbosacral region M48.07 (ICD-10-CM). Lumbar spondylosis M47.816 (ICD-10-CM).   EXAM: MRI LUMBAR SPINE WITHOUT CONTRAST   TECHNIQUE: Multiplanar, multisequence MR imaging of the lumbar spine was performed. No intravenous contrast was administered.   COMPARISON:  Radiographs June 19, 2021.   FINDINGS: Segmentation:  Standard.   Alignment:  Trace anterolisthesis at L4-5.   Vertebrae:  No fracture, evidence of discitis, or bone lesion.   Conus medullaris and cauda equina: Conus extends to the T12 level. Conus and cauda equina appear normal.   Paraspinal and other soft tissues: Postsurgical changes in the posterior paraspinal soft tissues.   Disc levels:   T12-L1: No spinal canal or neural foraminal stenosis.   L1-2: Shallow disc bulge and mild facet degenerative changes without significant spinal canal or neural foraminal stenosis.   L2-3: Shallow disc bulge and mild-to-moderate facet degenerative changes with ligamentum flavum redundancy without significant spinal or neural foraminal stenosis.   L3-4: Disc bulge with superimposed small right foraminal disc protrusion, moderate hypertrophic facet degenerative changes and ligamentum flavum redundancy resulting in mild spinal canal stenosis and mild right neural foraminal narrowing.   L4-5: Disc bulge, moderate to advanced hypertrophic facet degenerative changes with trace left facet effusion and ligamentum flavum redundancy resulting in mild spinal canal Sten. No significant neural.   L5-S1: Postsurgical changes from left laminotomy. Shallow disc bulge with a left foraminal/far  lateral disc osteophyte complex and mild facet degenerative changes resulting in mild left neural foraminal. No significant spinal canal stenosis.   IMPRESSION: 1. Degenerative changes of the lumbar spine, more pronounced at the level of the facet joints at L3-4 and L4-5. No high-grade spinal canal or neural foraminal stenosis at any level.     Electronically Signed   By: Pedro Earls M.D.   On: 11/10/2021 10:00  PATIENT SURVEYS:  FOTO 04/13/22: 34 with target score of 50  SCREENING FOR RED FLAGS: Bowel or bladder incontinence: No Spinal tumors: No Cauda equina syndrome: No Compression fracture: No Abdominal aneurysm: No  COGNITION: Overall cognitive status: Within functional limits for tasks assessed     SENSATION: WFL   POSTURE: rounded shoulders, forward head, and increased lumbar lordosis  PALPATION: TTP along piriformis on LLE and L sided lumbar paraspinals L5-L1  LUMBAR ROM:   AROM eval  Flexion 75%*  Extension 100%  Right lateral flexion 75%  Left lateral flexion 75%  Right rotation 100%  Left rotation 100%   (Blank rows = not tested)  LOWER EXTREMITY ROM:     Active  Right eval Left eval  Hip flexion 90 90  Hip extension    Hip abduction    Hip adduction    Hip internal rotation 32 25*  Hip external rotation WNL WNL  Knee flexion    Knee extension    Ankle dorsiflexion    Ankle plantarflexion    Ankle inversion    Ankle eversion     (Blank rows = not tested)  LOWER EXTREMITY MMT:    MMT Right eval Left eval  Hip flexion 4/5 4/5  Hip extension    Hip abduction 4/5 4/5  Hip adduction    Hip internal rotation 5/5 4/5*  Hip external rotation 5/5 5/5  Knee flexion 5/5 5/5  Knee extension 5/5 4-/5  Ankle dorsiflexion 5/5 4-/5  Ankle plantarflexion 5/5 5/5  Ankle inversion    Ankle eversion     (  Blank rows = not tested)  LUMBAR SPECIAL TESTS:  Straight leg raise test: Positive, Slump test: Positive, SI  Compression/distraction test: Negative, and FABER test: Negative  FADIR positive on LLE  FAIR test positive on LLE  Repeated lumbar extension: positive with centralization of symptoms 15-20 reps  FUNCTIONAL TESTS:  5 times sit to stand: 28.96 seconds 10 meter walk test: .6 m/s  GAIT: Distance walked: 10 meters Assistive device utilized: None Level of assistance: Complete Independence Comments: Mild antalgic gait on LLE with decreased stance time, hip extension in terminal stance.   TODAY'S TREATMENT:                                                                                                                              DATE:   05/05/22  *All band non-latex* Nu-Step no resistance and seat and arms set to 7 for 5 min  Standing Marches with BUE support alternating  1 x 10  Standing Marches with BUE support alternating  2 x 10  Standing Hip Abduction with BUE support 2 x 10  Side Lying Hip Abduction 2 x 10  -Pt reports light headedness after laying on side. She thinks this is due to recent ear infection.   Vitals BP 134/68 in sitting    -Pt reports sharp pain down right leg  Seated Hip Abduction with Red Band 1 x 10  Seated Hip Abduction with Green Band 2 x 10 Seated Hip Abduction with Blue Band 1 x 10   04/13/22   There.ex: FOTO: 34 with target of 50   6 MWT: 1,106'. Pain up to 5/10 NPS. 7th decade female norm is 1,554'.    Discussion on increased falls risk based off of FGA score as seen above.   Repeated lumbar extension: x15   Seated nerve glides on LLE floss technique. X15, initial mod, multimodal cuing for form/technique.   Seated LLE figure 4: 2x30 sec   STS x5 with SUE support   Seated toe raises for ankle DF strengthening: x20     PATIENT EDUCATION:  Education details: form/technique with exercise. Falls risk based off of 6 MWT and FGA.  Person educated: Patient Education method: Explanation, Demonstration, and Handouts Education comprehension:  verbalized understanding and needs further education  HOME EXERCISE PROGRAM:  Access Code: [email or web address] URL: [email or web address] Date: 05/05/2022 Prepared by: Bradly Chris  Exercises - Standing Lumbar Extension  - 1 x daily - 7 x weekly - 3 sets - 20 reps - Seated Slump Nerve Glide  - 1 x daily - 7 x weekly - 2 sets - 20 reps - Sit to Stand Without Arm Support  - 1 x daily - 3-4 x weekly - 3 sets - 5 reps - Seated Toe Raise  - 1 x daily - 3-4 x weekly - 3 sets - 20 reps - Standing March with Counter Support  - 3 x weekly - 3 sets - 10 reps - Seated Hip Abduction  with Resistance  - 3 x weekly - 3 sets - 10 reps  ASSESSMENT:  CLINICAL IMPRESSION:  Pt able to perform most exercises without an increase in her low back pain or radicular symptoms. She was not able to perform side lying or stand hip abduction without an increase in her symptoms. HEP modified according to include non-pain provoking exercises which included seated hip abduction and standing marches. Pt will continue to benefit from skilled PT services to address these impairments to reduce falls risk and decrease pain to return pt to PLOF.   OBJECTIVE IMPAIRMENTS: Abnormal gait, decreased activity tolerance, decreased balance, decreased mobility, difficulty walking, decreased ROM, decreased strength, impaired flexibility, obesity, and pain.   ACTIVITY LIMITATIONS: bending, sitting, standing, squatting, sleeping, bed mobility, bathing, dressing, and locomotion level  PARTICIPATION LIMITATIONS: cleaning, driving, shopping, community activity, and church  PERSONAL FACTORS: Age, Past/current experiences, Time since onset of injury/illness/exacerbation, and 3+ comorbidities: Hx of breast cancer, HLD, HTN, neuropathy  are also affecting patient's functional outcome.   REHAB POTENTIAL: Good  CLINICAL DECISION MAKING: Stable/uncomplicated  EVALUATION COMPLEXITY: Low   GOALS: Goals reviewed with patient?  No  SHORT TERM GOALS: Target date: 05/05/22  Pt will be independent with HEP to improve LBP and radicular symptoms for ADL completion. Baseline: 04/07/22: given HEP Goal status: INITIAL  LONG TERM GOALS: Target date: 06/02/22  Pt will improve FOTO to target score to demonstrate clinically significant improvement in functional mobility Baseline: 04/07/22: Deferred to next session; 04/13/22: 34 with target of 50.  Goal status: INITIAL  2.  Pt will improve 5xSTS to 12.6 seconds to meet age matched norms to demonstrate reduced falls risk and improved LE strength for standing and walking tasks Baseline: 04/07/22: 28.96 sec Goal status: INITIAL  3.  Pt will report worst LBP as a 4/10 NPS with standing, sitting, and walking ADL's to demonstrate clinically significant reduction in LBP. Baseline: 04/07/22: 7/10 NPS Goal status: INITIAL  4.  Pt will improve 10 m gait speed >/= to 1.0 m/s to demonstrate ability to safely complete household distances with reduced risk of falls.  Baseline: 04/07/22: .6 m/s Goal status: INITIAL PLAN:  PT FREQUENCY: 2x/week  PT DURATION: 8 weeks  PLANNED INTERVENTIONS: Therapeutic exercises, Therapeutic activity, Neuromuscular re-education, Balance training, Gait training, Patient/Family education, Self Care, Joint mobilization, Joint manipulation, Stair training, Aquatic Therapy, Dry Needling, Electrical stimulation, Spinal manipulation, Spinal mobilization, Cryotherapy, Moist heat, and Manual therapy.  PLAN FOR NEXT SESSION: Reassess goals and continue to progress hip strengthening exercises and introduce abdominal strengthening exercises    Bradly Chris PT, DPT  Physical Therapist- [facility]  05/05/2022, 3:15 PM

## 2022-05-07 ENCOUNTER — Ambulatory Visit: Payer: PPO | Admitting: Physical Therapy

## 2022-05-11 ENCOUNTER — Telehealth: Payer: Self-pay | Admitting: Physical Therapy

## 2022-05-11 ENCOUNTER — Ambulatory Visit: Payer: PPO | Admitting: Physical Therapy

## 2022-05-11 ENCOUNTER — Other Ambulatory Visit: Payer: Self-pay

## 2022-05-11 DIAGNOSIS — Z130 Encounter for screening for diseases of the blood and blood-forming organs and certain disorders involving the immune mechanism: Secondary | ICD-10-CM

## 2022-05-11 DIAGNOSIS — Z0000 Encounter for general adult medical examination without abnormal findings: Secondary | ICD-10-CM

## 2022-05-11 NOTE — Telephone Encounter (Signed)
Called pt to inquire about missed appointment. Pt reports that she cancelled because of death in her family. Pt will reschedule once she has a better idea about what days she has free.

## 2022-05-13 ENCOUNTER — Other Ambulatory Visit: Payer: Self-pay | Admitting: Nurse Practitioner

## 2022-05-13 ENCOUNTER — Telehealth: Payer: Self-pay | Admitting: *Deleted

## 2022-05-13 ENCOUNTER — Ambulatory Visit: Payer: PPO | Admitting: Physical Therapy

## 2022-05-13 DIAGNOSIS — B379 Candidiasis, unspecified: Secondary | ICD-10-CM

## 2022-05-13 MED ORDER — FLUCONAZOLE 150 MG PO TABS: ORAL_TABLET | ORAL | 0 refills | Status: DC

## 2022-05-13 NOTE — Telephone Encounter (Signed)
Pt informed of below.Flecia Shutter Zimmerman Rumple, CMA ? ?

## 2022-05-13 NOTE — Telephone Encounter (Signed)
Please let the patient know that I have sent difucan for her. She should take this once. If symptoms do not resolve or come back, she can repeat this in 3 days. I also recommend she use over the counter miconazole cream for external symptoms.  Thanks so much.   -HB

## 2022-05-13 NOTE — Telephone Encounter (Signed)
Pt called and stated that she was seen in ED on 04/28/23.  She said that she was given some antibiotics and she is now experiencing some discomfort/itching in her vaginal area and would like to see if she can have something sent if for this. She would like it to go to [facility] in [place]. Cyncere Sontag Katharina Caper, CMA

## 2022-05-19 ENCOUNTER — Other Ambulatory Visit: Payer: PPO

## 2022-05-19 DIAGNOSIS — Z1329 Encounter for screening for other suspected endocrine disorder: Secondary | ICD-10-CM | POA: Diagnosis not present

## 2022-05-19 DIAGNOSIS — Z1321 Encounter for screening for nutritional disorder: Secondary | ICD-10-CM | POA: Diagnosis not present

## 2022-05-19 DIAGNOSIS — Z13228 Encounter for screening for other metabolic disorders: Secondary | ICD-10-CM | POA: Diagnosis not present

## 2022-05-19 DIAGNOSIS — Z0000 Encounter for general adult medical examination without abnormal findings: Secondary | ICD-10-CM | POA: Diagnosis not present

## 2022-05-19 DIAGNOSIS — Z130 Encounter for screening for diseases of the blood and blood-forming organs and certain disorders involving the immune mechanism: Secondary | ICD-10-CM | POA: Diagnosis not present

## 2022-05-20 DIAGNOSIS — H40033 Anatomical narrow angle, bilateral: Secondary | ICD-10-CM | POA: Diagnosis not present

## 2022-05-20 DIAGNOSIS — H401112 Primary open-angle glaucoma, right eye, moderate stage: Secondary | ICD-10-CM | POA: Diagnosis not present

## 2022-05-20 DIAGNOSIS — M3501 Sicca syndrome with keratoconjunctivitis: Secondary | ICD-10-CM | POA: Diagnosis not present

## 2022-05-20 DIAGNOSIS — H2512 Age-related nuclear cataract, left eye: Secondary | ICD-10-CM | POA: Diagnosis not present

## 2022-05-20 LAB — COMPREHENSIVE METABOLIC PANEL
ALT: 11 IU/L (ref 0–32)
AST: 15 IU/L (ref 0–40)
Albumin/Globulin Ratio: 1.5 (ref 1.2–2.2)
Albumin: 4.3 g/dL (ref 3.8–4.8)
Alkaline Phosphatase: 83 IU/L (ref 44–121)
BUN/Creatinine Ratio: 16 (ref 12–28)
BUN: 14 mg/dL (ref 8–27)
Bilirubin Total: 0.3 mg/dL (ref 0.0–1.2)
CO2: 24 mmol/L (ref 20–29)
Calcium: 10.5 mg/dL — ABNORMAL HIGH (ref 8.7–10.3)
Chloride: 102 mmol/L (ref 96–106)
Creatinine, Ser: 0.89 mg/dL (ref 0.57–1.00)
Globulin, Total: 2.8 g/dL (ref 1.5–4.5)
Glucose: 112 mg/dL — ABNORMAL HIGH (ref 70–99)
Potassium: 4.3 mmol/L (ref 3.5–5.2)
Sodium: 140 mmol/L (ref 134–144)
Total Protein: 7.1 g/dL (ref 6.0–8.5)
eGFR: 69 mL/min/{1.73_m2} (ref >59)

## 2022-05-20 LAB — CBC
Hematocrit: 32.4 % — ABNORMAL LOW (ref 34.0–46.6)
Hemoglobin: 10.2 g/dL — ABNORMAL LOW (ref 11.1–15.9)
MCH: 25.2 pg — ABNORMAL LOW (ref 26.6–33.0)
MCHC: 31.5 g/dL (ref 31.5–35.7)
MCV: 80 fL (ref 79–97)
Platelets: 117 10*3/uL — ABNORMAL LOW (ref 150–450)
RBC: 4.05 x10E6/uL (ref 3.77–5.28)
RDW: 14.5 % (ref 11.7–15.4)
WBC: 6.8 10*3/uL (ref 3.4–10.8)

## 2022-05-20 LAB — LIPID PANEL
Chol/HDL Ratio: 4.9 ratio — ABNORMAL HIGH (ref 0.0–4.4)
Cholesterol, Total: 300 mg/dL — ABNORMAL HIGH (ref 100–199)
HDL: 61 mg/dL (ref >39)
LDL Chol Calc (NIH): 207 mg/dL — ABNORMAL HIGH (ref 0–99)
Triglycerides: 173 mg/dL — ABNORMAL HIGH (ref 0–149)
VLDL Cholesterol Cal: 32 mg/dL (ref 5–40)

## 2022-05-20 LAB — HEMOGLOBIN A1C
Est. average glucose Bld gHb Est-mCnc: 120 mg/dL
Hgb A1c MFr Bld: 5.8 % — ABNORMAL HIGH (ref 4.8–5.6)

## 2022-05-20 LAB — TSH: TSH: 0.627 u[IU]/mL (ref 0.450–4.500)

## 2022-05-20 NOTE — Progress Notes (Signed)
Mild anemia, improved HgbA1c. Very high cholesterol, improved calcium. Discuss at visit 05/26/2022

## 2022-05-26 ENCOUNTER — Ambulatory Visit (INDEPENDENT_AMBULATORY_CARE_PROVIDER_SITE_OTHER): Payer: PPO | Admitting: Nurse Practitioner

## 2022-05-26 ENCOUNTER — Encounter: Payer: Self-pay | Admitting: Nurse Practitioner

## 2022-05-26 VITALS — BP 132/82 | HR 80 | Ht 63.0 in | Wt 192.4 lb

## 2022-05-26 DIAGNOSIS — Z0000 Encounter for general adult medical examination without abnormal findings: Secondary | ICD-10-CM | POA: Diagnosis not present

## 2022-05-26 DIAGNOSIS — E785 Hyperlipidemia, unspecified: Secondary | ICD-10-CM

## 2022-05-26 DIAGNOSIS — F5101 Primary insomnia: Secondary | ICD-10-CM | POA: Diagnosis not present

## 2022-05-26 DIAGNOSIS — I10 Essential (primary) hypertension: Secondary | ICD-10-CM

## 2022-05-26 DIAGNOSIS — Z6834 Body mass index (BMI) 34.0-34.9, adult: Secondary | ICD-10-CM | POA: Diagnosis not present

## 2022-05-26 DIAGNOSIS — M5417 Radiculopathy, lumbosacral region: Secondary | ICD-10-CM | POA: Diagnosis not present

## 2022-05-26 MED ORDER — ROSUVASTATIN CALCIUM 5 MG PO TABS: 2.5000 mg | ORAL_TABLET | Freq: Every day | ORAL | 1 refills | Status: DC

## 2022-05-26 MED ORDER — ZOLPIDEM TARTRATE 10 MG PO TABS: 10.0000 mg | ORAL_TABLET | Freq: Every evening | ORAL | 1 refills | Status: DC | PRN

## 2022-05-26 MED ORDER — GABAPENTIN 100 MG PO CAPS: 100.0000 mg | ORAL_CAPSULE | Freq: Two times a day (BID) | ORAL | 1 refills | Status: DC

## 2022-05-26 MED ORDER — CYCLOBENZAPRINE HCL 10 MG PO TABS: 10.0000 mg | ORAL_TABLET | Freq: Every evening | ORAL | 2 refills | Status: DC | PRN

## 2022-05-26 NOTE — Progress Notes (Signed)
Subjective:   Regina Deleon is a 72 y.o. female who presents for Medicare Annual (Subsequent) preventive examination. -routine, fasting labs done  --mild anemia  --very high cholesterol  --states that she has been out of her cholesterol medication for some time.  --improved calcium. Has stopped taking calcium supplement.  --PTH and ionized calcium have been normal in the past.  --HgbA1c 5.8 with glucose at 112 -mammogram done 02/19/2022 - negative -bone density done 2017 - osteopenic   Review of Systems    See HPI        Objective:    Today's Vitals   05/26/22 1348  BP: 132/82  Pulse: 80  SpO2: 99%  Weight: 192 lb 6.4 oz (87.3 kg)  Height: '5\' 3"'$  (1.6 m)   Body mass index is 34.08 kg/m.    Row Labels 04/07/2022    8:50 AM 07/28/2019    8:50 AM 12/29/2017    1:54 PM 02/05/2016   11:19 AM 01/31/2015    8:50 AM 11/07/2014    8:07 AM 09/26/2014    8:48 AM  Advanced Directives   Section Header. No data exists in this row.         Does Patient Have a Medical Advance Directive?   Yes No No Yes Yes Yes Yes  Type of Scientist, physiological of [place];Living will    Healthcare Power of [place] of [place]  Does patient want to make changes to medical advance directive?   Yes (Inpatient - patient defers changing a medical advance directive at this time - Information given)        Copy of [place] in Chart?     No - copy requested      Would patient like information on creating a medical advance directive?    No - Patient declined No - Patient declined        Current Medications (verified) Outpatient Encounter Medications as of 05/26/2022  Medication Sig   acetaminophen (TYLENOL) 500 MG tablet Take 1,000 mg by mouth every 6 (six) hours as needed for mild pain.    aspirin 81 MG tablet Take 81 mg by mouth daily.    cetirizine (ZYRTEC) 10 MG tablet Take 10 mg by mouth daily as needed for allergies.    Cholecalciferol (VITAMIN D-3) 125 MCG (5000 UT) TABS Take 5,000 mg by mouth daily.   cyclobenzaprine (FLEXERIL) 10 MG tablet Take 1 tablet (10 mg total) by mouth at bedtime as needed for muscle spasms.   diclofenac (VOLTAREN) 50 MG EC tablet TAKE 1 TABLET (50 MG TOTAL) BY MOUTH 2 (TWO) TIMES DAILY AS NEEDED FOR MODERATE PAIN.   diclofenac Sodium (VOLTAREN) 1 % GEL Apply 4 g topically 4 (four) times daily as needed.   docusate sodium (COLACE) 100 MG capsule Take 200 mg by mouth daily.   dorzolamide-timolol (COSOPT) 22.3-6.8 MG/ML ophthalmic solution Place 1 drop into both eyes 2 (two) times daily.   fluconazole (DIFLUCAN) 150 MG tablet Take 1 tablet po once. May repeat dose in 3 days as needed for persistent symptoms.   fluticasone (FLONASE) 50 MCG/ACT nasal spray SPRAY 2 SPRAYS INTO EACH NOSTRIL EVERY DAY   gabapentin (NEURONTIN) 100 MG capsule Take 1 capsule (100 mg total) by mouth 2 (two) times daily.   latanoprost (XALATAN) 0.005 % ophthalmic solution Place 1 drop into both eyes at bedtime.   neomycin-polymyxin b-dexamethasone (MAXITROL) 3.5-10000-0.1 SUSP Place 1 drop into the left  eye 4 (four) times daily.   NIFEdipine (PROCARDIA-XL/NIFEDICAL-XL) 30 MG 24 hr tablet TAKE 1 TABLET BY MOUTH TWICE A DAY   Omega 3 1000 MG CAPS Take 1 capsule (1,000 mg total) by mouth daily.   omega-3 acid ethyl esters (LOVAZA) 1 g capsule TAKE 1 CAPSULE (1,000 MG TOTAL) BY MOUTH DAILY.   omeprazole (PRILOSEC OTC) 20 MG tablet Take 20 mg by mouth daily as needed (acid reflux).   ondansetron (ZOFRAN) 4 MG tablet Take 1 tablet (4 mg total) by mouth every 8 (eight) hours as needed for nausea or vomiting.   rOPINIRole (REQUIP) 0.5 MG tablet TAKE 1-2 TABLETS (0.5-1 MG TOTAL) BY MOUTH 3 (THREE) TIMES DAILY.   traMADol (ULTRAM) 50 MG tablet Take 1 tablet (50 mg total) by mouth every 8 (eight) hours as needed for moderate pain.   triamterene-hydrochlorothiazide (MAXZIDE-25) 37.5-25 MG tablet TAKE 1 TABLET BY MOUTH EVERY DAY    [DISCONTINUED] methocarbamol (ROBAXIN) 500 MG tablet Take 1 tablet (500 mg total) by mouth every 8 (eight) hours as needed for muscle spasms.   [DISCONTINUED] rosuvastatin (CRESTOR) 5 MG tablet TAKE 1 TABLET BY MOUTH THREE TIMES A WEEK   [DISCONTINUED] zolpidem (AMBIEN) 10 MG tablet TAKE 1 TABLET (10 MG TOTAL) BY MOUTH AT BEDTIME AS NEEDED. FOR SLEEP   rosuvastatin (CRESTOR) 5 MG tablet Take 0.5 tablets (2.5 mg total) by mouth daily.   tamsulosin (FLOMAX) 0.4 MG CAPS capsule Take 1 capsule (0.4 mg total) by mouth daily after supper. (Patient not taking: Reported on 04/09/2021)   zolpidem (AMBIEN) 10 MG tablet Take 1 tablet (10 mg total) by mouth at bedtime as needed. for sleep   No facility-administered encounter medications on file as of 05/26/2022.    Allergies (verified) Darvocet [propoxyphene n-acetaminophen], Latex, Penicillins, Cephalexin, and Enalapril   History: Past Medical History:  Diagnosis Date   Cancer ([place])    lt br cancer   Edema leg    with chemo   GERD (gastroesophageal reflux disease)    also, diverticulosis   History of kidney stones    Hyperlipidemia    Hypertension    Neuropathy    hands and feet post chemo   Wears glasses    Past Surgical History:  Procedure Laterality Date   ABDOMINAL HYSTERECTOMY  1986   AXILLARY LYMPH NODE DISSECTION Left 06/26/2014   Procedure: AXILLARY LYMPH NODE DISSECTION;  Surgeon: Alphonsa Overall, MD;  Location: [facility];  Service: General;  Laterality: Left;   [place]   lumbar   BREAST SURGERY Left 06/2014   CATARACT EXTRACTION W/PHACO Right 03/23/2019   Procedure: CATARACT EXTRACTION PHACO AND INTRAOCULAR LENS PLACEMENT ([place]) RIGHT VISION BLUE;  Surgeon: Marchia Meiers, MD;  Location: [facility];  Service: Ophthalmology;  Laterality: Right;  [place] 00:55.4 CDE 5.92 Fluid Pack lot # [id number]   COLONOSCOPY     COLONOSCOPY WITH PROPOFOL N/A 12/29/2017   Procedure: COLONOSCOPY WITH PROPOFOL;  Surgeon:  Toledo, Benay Pike, MD;  Location: [facility];  Service: Gastroenterology;  Laterality: N/A;   CYSTOSCOPY/URETEROSCOPY/HOLMIUM LASER/STENT PLACEMENT Bilateral 07/28/2019   Procedure: CYSTOSCOPY/URETEROSCOPY/HOLMIUM LASER/STENT PLACEMENT;  Surgeon: Billey Co, MD;  Location: [facility];  Service: Urology;  Laterality: Bilateral;   DENTAL SURGERY Right    top    DILATION AND CURETTAGE OF UTERUS     LAPAROSCOPIC GASTRIC BANDING  10/19/05   MASTECTOMY Left 2016   MASTECTOMY MODIFIED RADICAL Left 06/26/2014   Procedure: MASTECTOMY MODIFIED RADICAL;  Surgeon: Alphonsa Overall, MD;  Location: [place];  Service: General;  Laterality: Left;   PORT A CATH REVISION     insertion-10/15   PORT-A-CATH REMOVAL N/A 02/07/2015   Procedure: MINOR REMOVAL PORT-A-CATH;  Surgeon: Alphonsa Overall, MD;  Location: [place];  Service: General;  Laterality: N/A;   Family History  Problem Relation Age of Onset   Asthma Mother    Diabetes Mother    Diabetes Father    Heart disease Father    Cancer Maternal Aunt    Cancer Cousin    Cancer Cousin    Cancer Cousin    Social History   Socioeconomic History   Marital status: Married    Spouse name: [place] Bells   Number of children: Not on file   Years of education: Not on file   Highest education level: Not on file  Occupational History   Not on file  Tobacco Use   Smoking status: Never   Smokeless tobacco: Never  Vaping Use   Vaping Use: Never used  Substance and Sexual Activity   Alcohol use: No   Drug use: No   Sexual activity: Yes  Other Topics Concern   Not on file  Social History Narrative   Not on file   Social Determinants of Health   Financial Resource Strain: Low Risk  (05/26/2022)   Overall Financial Resource Strain (CARDIA)    Difficulty of Paying Living Expenses: Not hard at all  Food Insecurity: No Food Insecurity (05/26/2022)   Hunger Vital Sign    Worried About Running Out of Food in the Last Year:  Never true    Christmas in the Last Year: Never true  Transportation Needs: No Transportation Needs (05/26/2022)   PRAPARE - Hydrologist (Medical): No    Lack of Transportation (Non-Medical): No  Physical Activity: Insufficiently Active (05/26/2022)   Exercise Vital Sign    Days of Exercise per Week: 2 days    Minutes of Exercise per Session: 50 min  Stress: No Stress Concern Present (05/26/2022)   [place]    Feeling of Stress : Only a little  Social Connections: Socially Integrated (05/26/2022)   Social Connection and Isolation Panel [NHANES]    Frequency of Communication with Friends and Family: More than three times a week    Frequency of Social Gatherings with Friends and Family: More than three times a week    Attends Religious Services: More than 4 times per year    Active Member of [employer] or Organizations: Yes    Attends Archivist Meetings: 1 to 4 times per year    Marital Status: Married    Tobacco Counseling Counseling given: Not Answered   Clinical Intake:  Pre-visit preparation completed: Yes  Pain : No/denies pain     BMI - recorded: 34.08 Nutritional Status: BMI > 30  Obese Nutritional Risks: None Diabetes: No  How often do you need to have someone help you when you read instructions, pamphlets, or other written materials from your doctor or pharmacy?: 1 - Never  Diabetic?no  Interpreter Needed?: No      Activities of Daily Living   Row Labels 05/26/2022    1:56 PM  In your present state of health, do you have any difficulty performing the following activities:   Section Header. No data exists in this row.   Hearing?   0  Vision?   0  Difficulty  concentrating or making decisions?   0  Walking or climbing stairs?   1  Dressing or bathing?   0  Doing errands, shopping?   1    Patient Care Team: Ronnell Freshwater, NP as PCP - General  (Family Medicine) Nicholas Lose, MD as Consulting Physician (Hematology and Oncology) Eppie Gibson, MD as Attending Physician (Radiation Oncology) Alphonsa Overall, MD as Consulting Physician (General Surgery)  Indicate any recent Medical Services you may have received from other than [facility] providers in the past year (date may be approximate).     Assessment:  1. Encounter for Medicare annual wellness exam Annual medicare visit today   2. Essential hypertension Stable. Continue bp medication as prescribed   3. Hyperlipidemia, unspecified hyperlipidemia type Very high lipid panel. Patient has been off crestor for some time. Restart crestor 5 mg daily. Recheck lipid panel in 3 to 4 months. Adjust dosing as indicated.  - rosuvastatin (CRESTOR) 5 MG tablet; Take 0.5 tablets (2.5 mg total) by mouth daily.  Dispense: 90 tablet; Refill: 1  4. Primary insomnia May continue to take ambien 10 mg at bedtime as needed.  - zolpidem (AMBIEN) 10 MG tablet; Take 1 tablet (10 mg total) by mouth at bedtime as needed. for sleep  Dispense: 30 tablet; Refill: 1  5. Lumbosacral radiculopathy Trial gabapentin 100 mg twice daily. May take flexeril 10 mg at bedtime as needed for muscle pain. Advised her not to take this if taking ambien. She voiced understanding and agreement.  - cyclobenzaprine (FLEXERIL) 10 MG tablet; Take 1 tablet (10 mg total) by mouth at bedtime as needed for muscle spasms.  Dispense: 30 tablet; Refill: 2 - gabapentin (NEURONTIN) 100 MG capsule; Take 1 capsule (100 mg total) by mouth 2 (two) times daily.  Dispense: 180 capsule; Refill: 1  6. BMI 34.0-34.9,adult Continue with low calorie, high-protein diet. Incorporate exercise into daily activities.     Hearing/Vision screen No results found.   Depression Screen   Row Labels 05/26/2022    1:55 PM 12/12/2021   10:05 AM 06/19/2021    9:18 AM 04/09/2021    9:12 AM 11/05/2020    9:17 AM 08/02/2020    9:11 AM 04/22/2020    9:02 AM  PHQ 2/9  Scores   Section Header. No data exists in this row.         PHQ - 2 Score   0 0 2 1 0 0 0  PHQ- 9 Score   '3 3 7 2 4 2     '$ Fall Risk   Row Labels 05/26/2022    1:56 PM 06/19/2021    9:19 AM 04/09/2021    9:13 AM 11/05/2020    9:16 AM 08/02/2020    9:11 AM  Fall Risk    Section Header. No data exists in this row.       Falls in the past year?   0 0 0 0 0  Number falls in past yr:   0 0 0 0   Injury with Fall?   0 0 0 0   Risk for fall due to :      No Fall Risks   Follow up   Falls evaluation completed Falls evaluation completed Falls evaluation completed Falls evaluation completed Falls evaluation completed    [place]:  Any stairs in or around the home? Yes  If so, are there any without handrails? No  Home free of loose throw rugs in walkways,  pet beds, electrical cords, etc? Yes  Adequate lighting in your home to reduce risk of falls? Yes   ASSISTIVE DEVICES UTILIZED TO PREVENT FALLS:  Life alert? No  Use of a cane, walker or w/c? No  Grab bars in the bathroom? Yes  Shower chair or bench in shower? No  Elevated toilet seat or a handicapped toilet? No   TIMED UP AND GO:  Was the test performed? Yes .  Length of time to ambulate 10 feet: 10 sec.   Gait steady and fast without use of assistive device  Cognitive Function:   Row Labels 12/26/2019    9:32 AM 11/29/2018   11:43 AM 10/14/2017    3:29 PM  MMSE - Mini Mental State Exam   Section Header. No data exists in this row.     Orientation to time   '5 5 5  '$ Orientation to Place   '5 5 5  '$ Registration   '3 3 3  '$ Attention/ Calculation   '5 5 5  '$ Recall   '3 3 3  '$ Language- name 2 objects   '2 2 2  '$ Language- repeat   '1 1 1  '$ Language- follow 3 step command   '3 3 3  '$ Language- read & follow direction   '1 1 1  '$ Write a sentence   '1 1 1  '$ Copy design   '1 1 1  '$ Total score   '30 30 30       '$ Row Labels 05/26/2022    2:06 PM 04/09/2021    9:14 AM  6CIT Screen   Section Header. No data exists in this  row.    What Year?   0 points 0 points  What month?   0 points 0 points  What time?   0 points 0 points  Count back from 20   0 points 0 points  Months in reverse   0 points 0 points  Repeat phrase   0 points 0 points  Total Score   0 points 0 points    Immunizations Immunization History  Administered Date(s) Administered   Fluad Quad(high Dose 65+) 01/11/2019, 01/23/2019   Influenza, High Dose Seasonal PF 02/17/2017, 02/01/2018   Influenza-Unspecified 03/17/2021   Moderna Sars-Covid-2 Vaccination 06/16/2019, 07/14/2019, 04/06/2020   Pneumococcal Polysaccharide-23 06/16/2016   Zoster Recombinat (Shingrix) 01/25/2021, 07/05/2021    TDAP status: Due, Education has been provided regarding the importance of this vaccine. Advised may receive this vaccine at local pharmacy or Health Dept. Aware to provide a copy of the vaccination record if obtained from local pharmacy or Health Dept. Verbalized acceptance and understanding.  Flu Vaccine status: Due, Education has been provided regarding the importance of this vaccine. Advised may receive this vaccine at local pharmacy or Health Dept. Aware to provide a copy of the vaccination record if obtained from local pharmacy or Health Dept. Verbalized acceptance and understanding.  Pneumococcal vaccine status: Declined,  Education has been provided regarding the importance of this vaccine but patient still declined. Advised may receive this vaccine at local pharmacy or Health Dept. Aware to provide a copy of the vaccination record if obtained from local pharmacy or Health Dept. Verbalized acceptance and understanding.   Covid-19 vaccine status: Information provided on how to obtain vaccines.   Qualifies for Shingles Vaccine? Yes   Zostavax completed No   Shingrix Completed?: No.    Education has been provided regarding the importance of this vaccine. Patient has been advised to call insurance company to determine out of  pocket expense if they have not  yet received this vaccine. Advised may also receive vaccine at local pharmacy or Health Dept. Verbalized acceptance and understanding.  Screening Tests Health Maintenance  Topic Date Due   DTaP/Tdap/Td (1 - Tdap) Never done   Pneumonia Vaccine 69+ Years old (2 of 2 - PCV) 06/16/2017   COVID-19 Vaccine (4 - 2023-24 season) 01/02/2022   INFLUENZA VACCINE  08/02/2022 (Originally 12/02/2021)   Diabetic kidney evaluation - Urine ACR  05/27/2027 (Originally 07/26/1968)   Diabetic kidney evaluation - eGFR measurement  05/20/2023   Medicare Annual Wellness (AWV)  05/27/2023   MAMMOGRAM  02/20/2024   COLONOSCOPY (Pts 45-53yr Insurance coverage will need to be confirmed)  12/30/2027   DEXA SCAN  Completed   Hepatitis C Screening  Completed   Zoster Vaccines- Shingrix  Completed   HPV VACCINES  Aged Out   FOOT EXAM  Discontinued   HEMOGLOBIN A1C  Discontinued   OPHTHALMOLOGY EXAM  Discontinued    Health Maintenance  Health Maintenance Due  Topic Date Due   DTaP/Tdap/Td (1 - Tdap) Never done   Pneumonia Vaccine 72 Years old (2 of 2 - PCV) 06/16/2017   COVID-19 Vaccine (4 - 2023-24 season) 01/02/2022    Colorectal cancer screening: Type of screening: Colonoscopy. Completed 2019. Repeat every 10 years  Mammogram status: Completed 2023. Repeat every year  Bone Density status: Completed 2017. Results reflect: Bone density results: NORMAL. Repeat every 0 years.  Lung Cancer Screening: (Low Dose CT Chest recommended if Age 72-80years, 30 pack-year currently smoking OR have quit w/in 15years.) does not qualify.   Lung Cancer Screening Referral: n/a  Additional Screening:  Hepatitis C Screening: does qualify; Completed 2021  Vision Screening: Recommended annual ophthalmology exams for early detection of glaucoma and other disorders of the eye. Is the patient up to date with their annual eye exam?  Yes  Who is the provider or what is the name of the office in which the patient attends  annual [facility] exams[facility] provider, would they like to be referred to a provider to establish care? No .   Dental Screening: Recommended annual dental exams for proper oral hygiene  Community Resource Referral / Chronic Care Management: CRR required this visit?  No   CCM required this visit?  No      Plan:     I have personally reviewed and noted the following in the patient's chart:   Medical and social history Use of alcohol, tobacco or illicit drugs  Current medications and supplements including opioid prescriptions. Patient is not currently taking opioid prescriptions. Functional ability and status Nutritional status Physical activity Advanced directives List of other physicians Hospitalizations, surgeries, and ER visits in previous 12 months Vitals Screenings to include cognitive, depression, and falls Referrals and appointments  In addition, I have reviewed and discussed with patient certain preventive protocols, quality metrics, and best practice recommendations. A written personalized care plan for preventive services as well as general preventive health recommendations were provided to patient.     HRonnell Freshwater NP   06/26/2022   Nurse Notes: 25 min face to face

## 2022-06-26 DIAGNOSIS — Z6834 Body mass index (BMI) 34.0-34.9, adult: Secondary | ICD-10-CM | POA: Insufficient documentation

## 2022-06-29 ENCOUNTER — Other Ambulatory Visit: Payer: Self-pay | Admitting: Nurse Practitioner

## 2022-06-29 DIAGNOSIS — I10 Essential (primary) hypertension: Secondary | ICD-10-CM

## 2022-07-08 DIAGNOSIS — H66002 Acute suppurative otitis media without spontaneous rupture of ear drum, left ear: Secondary | ICD-10-CM | POA: Diagnosis not present

## 2022-07-08 DIAGNOSIS — B9689 Other specified bacterial agents as the cause of diseases classified elsewhere: Secondary | ICD-10-CM | POA: Diagnosis not present

## 2022-07-08 DIAGNOSIS — M5442 Lumbago with sciatica, left side: Secondary | ICD-10-CM | POA: Diagnosis not present

## 2022-07-08 DIAGNOSIS — J019 Acute sinusitis, unspecified: Secondary | ICD-10-CM | POA: Diagnosis not present

## 2022-07-08 DIAGNOSIS — Z03818 Encounter for observation for suspected exposure to other biological agents ruled out: Secondary | ICD-10-CM | POA: Diagnosis not present

## 2022-08-17 DIAGNOSIS — R220 Localized swelling, mass and lump, head: Secondary | ICD-10-CM | POA: Diagnosis not present

## 2022-08-21 ENCOUNTER — Telehealth: Payer: Self-pay

## 2022-08-21 NOTE — Telephone Encounter (Signed)
Pt is requesting a refill   zolpidem (AMBIEN) 10 MG tablet   Pharmacy: [id number] - [place], [place] - [address]   LOV 05/26/22  ROV 09/24/22

## 2022-08-22 ENCOUNTER — Other Ambulatory Visit: Payer: Self-pay | Admitting: Nurse Practitioner

## 2022-08-22 DIAGNOSIS — F5101 Primary insomnia: Secondary | ICD-10-CM

## 2022-08-24 NOTE — Telephone Encounter (Signed)
Please let her know that I have just taken care of this. Thanks  -HB

## 2022-09-24 ENCOUNTER — Encounter: Payer: Self-pay | Admitting: Nurse Practitioner

## 2022-09-24 ENCOUNTER — Ambulatory Visit (INDEPENDENT_AMBULATORY_CARE_PROVIDER_SITE_OTHER): Payer: PPO | Admitting: Nurse Practitioner

## 2022-09-24 VITALS — BP 139/77 | HR 76 | Ht 63.0 in | Wt 194.4 lb

## 2022-09-24 DIAGNOSIS — F5101 Primary insomnia: Secondary | ICD-10-CM | POA: Diagnosis not present

## 2022-09-24 DIAGNOSIS — M170 Bilateral primary osteoarthritis of knee: Secondary | ICD-10-CM

## 2022-09-24 DIAGNOSIS — I10 Essential (primary) hypertension: Secondary | ICD-10-CM

## 2022-09-24 DIAGNOSIS — M5417 Radiculopathy, lumbosacral region: Secondary | ICD-10-CM

## 2022-09-24 MED ORDER — DICLOFENAC SODIUM 1 % EX GEL
4.0000 g | Freq: Four times a day (QID) | CUTANEOUS | 5 refills | Status: DC | PRN
Start: 2022-09-24 — End: 2024-02-14

## 2022-09-24 MED ORDER — ZOLPIDEM TARTRATE 10 MG PO TABS
10.0000 mg | ORAL_TABLET | Freq: Every evening | ORAL | 3 refills | Status: DC | PRN
Start: 2022-09-24 — End: 2023-02-24

## 2022-09-24 MED ORDER — METHOCARBAMOL 500 MG PO TABS
500.0000 mg | ORAL_TABLET | Freq: Two times a day (BID) | ORAL | 3 refills | Status: AC | PRN
Start: 2022-09-24 — End: ?

## 2022-09-24 NOTE — Progress Notes (Signed)
Established patient visit   Patient: Regina Deleon   DOB: 08/11/1950   72 y.o. Female  MRN: [id number] Visit Date: 09/24/2022   Chief Complaint  Patient presents with   Medical Management of Chronic Issues   Subjective    HPI  Follow-up Hypertension -Generally well-managed on current medications. Chronic back pain.  History of multiple surgeries. -To see orthopedics for management of chronic low back pain. Insomnia. -Does take Ambien at bedtime to help with sleep.  Several alternatives have been tried without success. --She does understand the risks and potential side effects associated with taking Ambien long-term.  She would like refill today. No new concerns or complaints today. -She denies chest pain, chest pressure, or shortness of breath. She denies headaches or visual disturbances. She denies abdominal pain, nausea, vomiting, or changes in bowel or bladder habits.     Medications: Outpatient Medications Prior to Visit  Medication Sig   acetaminophen (TYLENOL) 500 MG tablet Take 1,000 mg by mouth every 6 (six) hours as needed for mild pain.    aspirin 81 MG tablet Take 81 mg by mouth daily.    cetirizine (ZYRTEC) 10 MG tablet Take 10 mg by mouth daily as needed for allergies.   Cholecalciferol (VITAMIN D-3) 125 MCG (5000 UT) TABS Take 5,000 mg by mouth daily.   diclofenac (VOLTAREN) 50 MG EC tablet TAKE 1 TABLET (50 MG TOTAL) BY MOUTH 2 (TWO) TIMES DAILY AS NEEDED FOR MODERATE PAIN.   docusate sodium (COLACE) 100 MG capsule Take 200 mg by mouth daily.   dorzolamide-timolol (COSOPT) 22.3-6.8 MG/ML ophthalmic solution Place 1 drop into both eyes 2 (two) times daily.   fluconazole (DIFLUCAN) 150 MG tablet Take 1 tablet po once. May repeat dose in 3 days as needed for persistent symptoms.   fluticasone (FLONASE) 50 MCG/ACT nasal spray SPRAY 2 SPRAYS INTO EACH NOSTRIL EVERY DAY   gabapentin (NEURONTIN) 100 MG capsule Take 1 capsule (100 mg total) by mouth 2 (two) times daily.    latanoprost (XALATAN) 0.005 % ophthalmic solution Place 1 drop into both eyes at bedtime.   neomycin-polymyxin b-dexamethasone (MAXITROL) 3.5-10000-0.1 SUSP Place 1 drop into the left eye 4 (four) times daily.   Omega 3 1000 MG CAPS Take 1 capsule (1,000 mg total) by mouth daily.   omega-3 acid ethyl esters (LOVAZA) 1 g capsule TAKE 1 CAPSULE (1,000 MG TOTAL) BY MOUTH DAILY.   omeprazole (PRILOSEC OTC) 20 MG tablet Take 20 mg by mouth daily as needed (acid reflux).   ondansetron (ZOFRAN) 4 MG tablet Take 1 tablet (4 mg total) by mouth every 8 (eight) hours as needed for nausea or vomiting.   rOPINIRole (REQUIP) 0.5 MG tablet TAKE 1-2 TABLETS (0.5-1 MG TOTAL) BY MOUTH 3 (THREE) TIMES DAILY.   rosuvastatin (CRESTOR) 5 MG tablet Take 0.5 tablets (2.5 mg total) by mouth daily.   traMADol (ULTRAM) 50 MG tablet Take 1 tablet (50 mg total) by mouth every 8 (eight) hours as needed for moderate pain.   triamterene-hydrochlorothiazide (MAXZIDE-25) 37.5-25 MG tablet TAKE 1 TABLET BY MOUTH EVERY DAY   [DISCONTINUED] diclofenac Sodium (VOLTAREN) 1 % GEL Apply 4 g topically 4 (four) times daily as needed.   [DISCONTINUED] NIFEdipine (PROCARDIA-XL/NIFEDICAL-XL) 30 MG 24 hr tablet TAKE 1 TABLET BY MOUTH TWICE A DAY   [DISCONTINUED] zolpidem (AMBIEN) 10 MG tablet TAKE 1 TABLET (10 MG TOTAL) BY MOUTH AT BEDTIME AS NEEDED. FOR SLEEP   tamsulosin (FLOMAX) 0.4 MG CAPS capsule Take 1 capsule (0.4 mg total)  by mouth daily after supper. (Patient not taking: Reported on 04/09/2021)   [DISCONTINUED] cyclobenzaprine (FLEXERIL) 10 MG tablet Take 1 tablet (10 mg total) by mouth at bedtime as needed for muscle spasms. (Patient not taking: Reported on 09/24/2022)   No facility-administered medications prior to visit.    Review of Systems See HPI     Last CBC Lab Results  Component Value Date   WBC 6.8 05/19/2022   HGB 10.2 (L) 05/19/2022   HCT 32.4 (L) 05/19/2022   MCV 80 05/19/2022   MCH 25.2 (L) 05/19/2022   RDW  14.5 05/19/2022   PLT 117 (L) 05/19/2022   Last metabolic panel Lab Results  Component Value Date   GLUCOSE 112 (H) 05/19/2022   NA 140 05/19/2022   K 4.3 05/19/2022   CL 102 05/19/2022   CO2 24 05/19/2022   BUN 14 05/19/2022   CREATININE 0.89 05/19/2022   EGFR 69 05/19/2022   CALCIUM 10.5 (H) 05/19/2022   PROT 7.1 05/19/2022   ALBUMIN 4.3 05/19/2022   LABGLOB 2.8 05/19/2022   AGRATIO 1.5 05/19/2022   BILITOT 0.3 05/19/2022   ALKPHOS 83 05/19/2022   AST 15 05/19/2022   ALT 11 05/19/2022   ANIONGAP 11 07/28/2019   Last lipids Lab Results  Component Value Date   CHOL 300 (H) 05/19/2022   HDL 61 05/19/2022   LDLCALC 207 (H) 05/19/2022   TRIG 173 (H) 05/19/2022   CHOLHDL 4.9 (H) 05/19/2022   Last hemoglobin A1c Lab Results  Component Value Date   HGBA1C 5.8 (H) 05/19/2022   Last thyroid functions Lab Results  Component Value Date   TSH 0.627 05/19/2022   Last vitamin D Lab Results  Component Value Date   VD25OH 84.7 03/18/2020   Last vitamin B12 and Folate Lab Results  Component Value Date   VITAMINB12 911 11/05/2020       Objective     Today's Vitals   09/24/22 0859  BP: 139/77  Pulse: 76  SpO2: 96%  Weight: 194 lb 6.4 oz (88.2 kg)  Height: 5\' 3"  (1.6 m)   Body mass index is 34.44 kg/m.  BP Readings from Last 3 Encounters:  09/24/22 139/77  05/26/22 132/82  12/12/21 116/79    Wt Readings from Last 3 Encounters:  09/24/22 194 lb 6.4 oz (88.2 kg)  05/26/22 192 lb 6.4 oz (87.3 kg)  12/12/21 193 lb 6.4 oz (87.7 kg)    Physical Exam Vitals and nursing note reviewed.  Constitutional:      Appearance: Normal appearance. She is well-developed.  HENT:     Head: Normocephalic and atraumatic.     Nose: Nose normal.     Mouth/Throat:     Mouth: Mucous membranes are moist.     Pharynx: Oropharynx is clear.  Eyes:     Extraocular Movements: Extraocular movements intact.     Conjunctiva/sclera: Conjunctivae normal.     Pupils: Pupils are  equal, round, and reactive to light.  Neck:     Vascular: No carotid bruit.  Cardiovascular:     Rate and Rhythm: Normal rate and regular rhythm.     Pulses: Normal pulses.     Heart sounds: Normal heart sounds.  Pulmonary:     Effort: Pulmonary effort is normal.     Breath sounds: Normal breath sounds.  Abdominal:     Palpations: Abdomen is soft.  Musculoskeletal:        General: Normal range of motion.     Cervical back: Normal range  of motion and neck supple.  Lymphadenopathy:     Cervical: No cervical adenopathy.  Skin:    General: Skin is warm and dry.     Capillary Refill: Capillary refill takes less than 2 seconds.  Neurological:     General: No focal deficit present.     Mental Status: She is alert and oriented to person, place, and time.  Psychiatric:        Mood and Affect: Mood normal.        Behavior: Behavior normal.        Thought Content: Thought content normal.        Judgment: Judgment normal.      Assessment & Plan    Essential hypertension Assessment & Plan: Stable. -Continue current medication.    Primary insomnia Assessment & Plan: May continue Ambien 10 mg at bedtime as needed for acute insomnia.  New prescription was sent to her pharmacy today.  Orders: -     Zolpidem Tartrate; Take 1 tablet (10 mg total) by mouth at bedtime as needed. for sleep  Dispense: 30 tablet; Refill: 3  Primary osteoarthritis of knees, bilateral Assessment & Plan: May use Voltaren gel up to 4 times daily on both knees as needed for pain and inflammation.  New prescription sent to her pharmacy.  Orders: -     Diclofenac Sodium; Apply 4 g topically 4 (four) times daily as needed.  Dispense: 100 g; Refill: 5  Lumbosacral radiculopathy Assessment & Plan: May take Robaxin up to twice daily as needed for muscle pain and tightness.  She understands she should not drive or go to work after taking this medication as it may cause her dizziness and/or drowsiness.  She is  agreeable to avoiding these activities if taking this medication.  Orders: -     Methocarbamol; Take 1 tablet (500 mg total) by mouth 2 (two) times daily as needed for muscle spasms.  Dispense: 60 tablet; Refill: 3     Return in about 4 months (around 01/25/2023) for blood pressure, med refills.         Carlean Jews, NP  [facility] [phone] (phone) [phone] (fax)  [facility]

## 2022-09-25 ENCOUNTER — Other Ambulatory Visit: Payer: Self-pay | Admitting: Family Medicine

## 2022-09-25 DIAGNOSIS — I10 Essential (primary) hypertension: Secondary | ICD-10-CM

## 2022-10-04 NOTE — Assessment & Plan Note (Signed)
Stable.  Continue current medication

## 2022-10-04 NOTE — Assessment & Plan Note (Signed)
May take Robaxin up to twice daily as needed for muscle pain and tightness.  She understands she should not drive or go to work after taking this medication as it may cause her dizziness and/or drowsiness.  She is agreeable to avoiding these activities if taking this medication.

## 2022-10-04 NOTE — Assessment & Plan Note (Signed)
May continue Ambien 10 mg at bedtime as needed for acute insomnia.  New prescription was sent to her pharmacy today.

## 2022-10-04 NOTE — Assessment & Plan Note (Signed)
May use Voltaren gel up to 4 times daily on both knees as needed for pain and inflammation.  New prescription sent to her pharmacy.

## 2022-10-16 DIAGNOSIS — M4807 Spinal stenosis, lumbosacral region: Secondary | ICD-10-CM | POA: Diagnosis not present

## 2022-10-16 DIAGNOSIS — M5416 Radiculopathy, lumbar region: Secondary | ICD-10-CM | POA: Diagnosis not present

## 2022-10-16 DIAGNOSIS — T451X5A Adverse effect of antineoplastic and immunosuppressive drugs, initial encounter: Secondary | ICD-10-CM | POA: Diagnosis not present

## 2022-10-16 DIAGNOSIS — M1612 Unilateral primary osteoarthritis, left hip: Secondary | ICD-10-CM | POA: Diagnosis not present

## 2022-10-16 DIAGNOSIS — M47816 Spondylosis without myelopathy or radiculopathy, lumbar region: Secondary | ICD-10-CM | POA: Diagnosis not present

## 2022-10-16 DIAGNOSIS — M5442 Lumbago with sciatica, left side: Secondary | ICD-10-CM | POA: Diagnosis not present

## 2022-10-16 DIAGNOSIS — G620 Drug-induced polyneuropathy: Secondary | ICD-10-CM | POA: Diagnosis not present

## 2022-10-17 DIAGNOSIS — Z79899 Other long term (current) drug therapy: Secondary | ICD-10-CM | POA: Diagnosis not present

## 2022-10-17 DIAGNOSIS — K118 Other diseases of salivary glands: Secondary | ICD-10-CM | POA: Diagnosis not present

## 2022-10-17 DIAGNOSIS — I10 Essential (primary) hypertension: Secondary | ICD-10-CM | POA: Diagnosis not present

## 2022-10-17 DIAGNOSIS — R519 Headache, unspecified: Secondary | ICD-10-CM | POA: Diagnosis not present

## 2022-10-17 DIAGNOSIS — K1379 Other lesions of oral mucosa: Secondary | ICD-10-CM | POA: Diagnosis not present

## 2022-10-17 DIAGNOSIS — K1122 Acute recurrent sialoadenitis: Secondary | ICD-10-CM | POA: Diagnosis not present

## 2022-10-17 DIAGNOSIS — R220 Localized swelling, mass and lump, head: Secondary | ICD-10-CM | POA: Diagnosis not present

## 2022-10-17 DIAGNOSIS — Z7982 Long term (current) use of aspirin: Secondary | ICD-10-CM | POA: Diagnosis not present

## 2022-10-17 DIAGNOSIS — K1120 Sialoadenitis, unspecified: Secondary | ICD-10-CM | POA: Diagnosis not present

## 2022-10-17 DIAGNOSIS — K111 Hypertrophy of salivary gland: Secondary | ICD-10-CM | POA: Diagnosis not present

## 2022-10-22 ENCOUNTER — Encounter: Payer: Self-pay | Admitting: Nurse Practitioner

## 2022-11-26 DIAGNOSIS — K1122 Acute recurrent sialoadenitis: Secondary | ICD-10-CM | POA: Diagnosis not present

## 2022-11-26 DIAGNOSIS — K1120 Sialoadenitis, unspecified: Secondary | ICD-10-CM | POA: Diagnosis not present

## 2022-11-26 DIAGNOSIS — R220 Localized swelling, mass and lump, head: Secondary | ICD-10-CM | POA: Diagnosis not present

## 2022-11-30 NOTE — Progress Notes (Unsigned)
Patient: Regina Deleon  Service Category: E/M  Provider: Oswaldo Done, MD  DOB: August 08, 1950  DOS: 12/02/2022  Referring Provider: Ronnette Juniper  MRN: [id number]  Setting: Ambulatory outpatient  PCP: Carlean Jews, NP  Type: New Patient  Specialty: Interventional Pain Management    Location: Office  Delivery: Face-to-face     Primary Reason(s) for Visit: Encounter for initial evaluation of one or more chronic problems (new to examiner) potentially causing chronic pain, and posing a threat to normal musculoskeletal function. (Level of risk: High) CC: No chief complaint on file.  HPI  Regina Deleon is a 72 y.o. year old, female patient, who comes for the first time to our practice referred by Evon Slack, PA-C for our initial evaluation of her chronic pain. She has History of laparoscopic adjustable gastric banding, 10/19/2005.; Breast cancer of upper-inner quadrant of left female breast (HCC); Constipation; Neuropathy due to chemotherapeutic drug (HCC); Dysuria; Encounter for general adult medical examination with abnormal findings; Essential hypertension; Primary insomnia; Primary osteoarthritis of knees, bilateral; Stomatitis and mucositis; Right sided sciatica; Cough; Low back pain; Lower abdominal pain; Multinodular goiter; History of left breast cancer; Hyperlipidemia; Encounter to establish care; Tooth, impacted; Dizziness; Other fatigue; History of iron deficiency anemia; Impaired fasting glucose; Leg cramps; Encounter for screening mammogram for malignant neoplasm of breast; Peroneal tendonitis of right lower extremity; Fall at home; Lumbosacral radiculopathy; Pain in unspecified hip; Restless legs; BMI 24.0-24.9, adult; and BMI 34.0-34.9,adult on their problem list. Today she comes in for evaluation of her No chief complaint on file.  Pain Assessment: Location:     Radiating:   Onset:   Duration:   Quality:   Severity:  /10 (subjective, self-reported pain score)  Effect  on ADL:   Timing:   Modifying factors:   BP:    HR:    Onset and Duration: {Hx; Onset and Duration:210120511} Cause of pain: {Hx; Cause:210120521} Severity: {Pain Severity:210120502} Timing: {Symptoms; Timing:210120501} Aggravating Factors: {Causes; Aggravating pain factors:210120507} Alleviating Factors: {Causes; Alleviating Factors:210120500} Associated Problems: {Hx; Associated problems:210120515} Quality of Pain: {Hx; Symptom quality or Descriptor:210120531} Previous Examinations or Tests: {Hx; Previous examinations or test:210120529} Previous Treatments: {Hx; Previous Treatment:210120503}  Regina Deleon is being evaluated for possible interventional pain management therapies for the treatment of her chronic pain.   ***  Regina Deleon has been informed that this initial visit was an evaluation only.  On the follow up appointment I will go over the results, including ordered tests and available interventional therapies. At that time she will have the opportunity to decide whether to proceed with offered therapies or not. In the event that Regina Deleon prefers avoiding interventional options, this will conclude our involvement in the case.  Medication management recommendations may be provided upon request.  Historic Controlled Substance Pharmacotherapy Review  PMP and historical list of controlled substances: ***  Most recently prescribed opioid analgesics:   *** MME/day: *** mg/day  Historical Monitoring: The patient  reports no history of drug use. List of prior UDS Testing: No results found for: "MDMA", "COCAINSCRNUR", "PCPSCRNUR", "PCPQUANT", "CANNABQUANT", "THCU", "ETH", "CBDTHCR", "D8THCCBX", "D9THCCBX" Historical Background Evaluation: [place] PMP: PDMP reviewed during this encounter. Review of the past 37-months conducted.             PMP NARX Score Report:  Narcotic: *** Sedative: *** Stimulant: *** [place] Department of public safety, offender search: Engineer, mining Information)  Non-contributory Risk Assessment Profile: Aberrant behavior: None observed or detected today Risk factors for fatal opioid overdose: None  identified today PMP NARX Overdose Risk Score: *** Fatal overdose hazard ratio (HR): Calculation deferred Non-fatal overdose hazard ratio (HR): Calculation deferred Risk of opioid abuse or dependence: 0.7-3.0% with doses ? 36 MME/day and 6.1-26% with doses ? 120 MME/day. Substance use disorder (SUD) risk level: See below Personal History of Substance Abuse (SUD-Substance use disorder):  Alcohol:    Illegal Drugs:    Rx Drugs:    ORT Risk Level calculation:    ORT Scoring interpretation table:  Score <3 = Low Risk for SUD  Score between 4-7 = Moderate Risk for SUD  Score >8 = High Risk for Opioid Abuse   PHQ-2 Depression Scale:  Total score:    PHQ-2 Scoring interpretation table: (Score and probability of major depressive disorder)  Score 0 = No depression  Score 1 = 15.4% Probability  Score 2 = 21.1% Probability  Score 3 = 38.4% Probability  Score 4 = 45.5% Probability  Score 5 = 56.4% Probability  Score 6 = 78.6% Probability   PHQ-9 Depression Scale:  Total score:    PHQ-9 Scoring interpretation table:  Score 0-4 = No depression  Score 5-9 = Mild depression  Score 10-14 = Moderate depression  Score 15-19 = Moderately severe depression  Score 20-27 = Severe depression (2.4 times higher risk of SUD and 2.89 times higher risk of overuse)   Pharmacologic Plan: As per protocol, I have not taken over any controlled substance management, pending the results of ordered tests and/or consults.            Initial impression: Pending review of available data and ordered tests.  Meds   Current Outpatient Medications:    acetaminophen (TYLENOL) 500 MG tablet, Take 1,000 mg by mouth every 6 (six) hours as needed for mild pain. , Disp: , Rfl:    aspirin 81 MG tablet, Take 81 mg by mouth daily. , Disp: , Rfl:    cetirizine (ZYRTEC) 10 MG tablet,  Take 10 mg by mouth daily as needed for allergies., Disp: , Rfl:    Cholecalciferol (VITAMIN D-3) 125 MCG (5000 UT) TABS, Take 5,000 mg by mouth daily., Disp: , Rfl:    diclofenac (VOLTAREN) 50 MG EC tablet, TAKE 1 TABLET (50 MG TOTAL) BY MOUTH 2 (TWO) TIMES DAILY AS NEEDED FOR MODERATE PAIN., Disp: 60 tablet, Rfl: 0   diclofenac Sodium (VOLTAREN) 1 % GEL, Apply 4 g topically 4 (four) times daily as needed., Disp: 100 g, Rfl: 5   docusate sodium (COLACE) 100 MG capsule, Take 200 mg by mouth daily., Disp: , Rfl:    dorzolamide-timolol (COSOPT) 22.3-6.8 MG/ML ophthalmic solution, Place 1 drop into both eyes 2 (two) times daily., Disp: , Rfl:    fluconazole (DIFLUCAN) 150 MG tablet, Take 1 tablet po once. May repeat dose in 3 days as needed for persistent symptoms., Disp: 3 tablet, Rfl: 0   fluticasone (FLONASE) 50 MCG/ACT nasal spray, SPRAY 2 SPRAYS INTO EACH NOSTRIL EVERY DAY, Disp: 48 mL, Rfl: 2   gabapentin (NEURONTIN) 100 MG capsule, Take 1 capsule (100 mg total) by mouth 2 (two) times daily., Disp: 180 capsule, Rfl: 1   latanoprost (XALATAN) 0.005 % ophthalmic solution, Place 1 drop into both eyes at bedtime., Disp: , Rfl:    methocarbamol (ROBAXIN) 500 MG tablet, Take 1 tablet (500 mg total) by mouth 2 (two) times daily as needed for muscle spasms., Disp: 60 tablet, Rfl: 3   neomycin-polymyxin b-dexamethasone (MAXITROL) 3.5-10000-0.1 SUSP, Place 1 drop into the left eye 4 (  four) times daily., Disp: , Rfl:    NIFEdipine (PROCARDIA-XL/NIFEDICAL-XL) 30 MG 24 hr tablet, TAKE 1 TABLET BY MOUTH TWICE A DAY, Disp: 180 tablet, Rfl: 0   Omega 3 1000 MG CAPS, Take 1 capsule (1,000 mg total) by mouth daily., Disp: 60 capsule, Rfl: 0   omega-3 acid ethyl esters (LOVAZA) 1 g capsule, TAKE 1 CAPSULE (1,000 MG TOTAL) BY MOUTH DAILY., Disp: 60 capsule, Rfl: 2   omeprazole (PRILOSEC OTC) 20 MG tablet, Take 20 mg by mouth daily as needed (acid reflux)., Disp: , Rfl:    ondansetron (ZOFRAN) 4 MG tablet, Take 1  tablet (4 mg total) by mouth every 8 (eight) hours as needed for nausea or vomiting., Disp: 8 tablet, Rfl: 0   rOPINIRole (REQUIP) 0.5 MG tablet, TAKE 1-2 TABLETS (0.5-1 MG TOTAL) BY MOUTH 3 (THREE) TIMES DAILY., Disp: 90 tablet, Rfl: 1   rosuvastatin (CRESTOR) 5 MG tablet, Take 0.5 tablets (2.5 mg total) by mouth daily., Disp: 90 tablet, Rfl: 1   tamsulosin (FLOMAX) 0.4 MG CAPS capsule, Take 1 capsule (0.4 mg total) by mouth daily after supper. (Patient not taking: Reported on 04/09/2021), Disp: 7 capsule, Rfl: 0   traMADol (ULTRAM) 50 MG tablet, Take 1 tablet (50 mg total) by mouth every 8 (eight) hours as needed for moderate pain., Disp: 15 tablet, Rfl: 0   triamterene-hydrochlorothiazide (MAXZIDE-25) 37.5-25 MG tablet, TAKE 1 TABLET BY MOUTH EVERY DAY, Disp: 90 tablet, Rfl: 3   zolpidem (AMBIEN) 10 MG tablet, Take 1 tablet (10 mg total) by mouth at bedtime as needed. for sleep, Disp: 30 tablet, Rfl: 3  Imaging Review  Cervical Imaging: Cervical MR wo contrast: No results found for this or any previous visit.  Cervical MR wo contrast: No valid procedures specified. Cervical MR w/wo contrast: No results found for this or any previous visit.  Cervical MR w contrast: No results found for this or any previous visit.  Cervical CT wo contrast: No results found for this or any previous visit.  Cervical CT w/wo contrast: No results found for this or any previous visit.  Cervical CT w/wo contrast: No results found for this or any previous visit.  Cervical CT w contrast: No results found for this or any previous visit.  Cervical CT outside: No results found for this or any previous visit.  Cervical DG 1 view: No results found for this or any previous visit.  Cervical DG 2-3 views: No results found for this or any previous visit.  Cervical DG F/E views: No results found for this or any previous visit.  Cervical DG 2-3 clearing views: No results found for this or any previous  visit.  Cervical DG Bending/F/E views: No results found for this or any previous visit.  Cervical DG complete: No results found for this or any previous visit.  Cervical DG Myelogram views: No results found for this or any previous visit.  Cervical DG Myelogram views: No results found for this or any previous visit.  Cervical Discogram views: No results found for this or any previous visit.   Shoulder Imaging: Shoulder-R MR w contrast: No results found for this or any previous visit.  Shoulder-L MR w contrast: No results found for this or any previous visit.  Shoulder-R MR w/wo contrast: No results found for this or any previous visit.  Shoulder-L MR w/wo contrast: No results found for this or any previous visit.  Shoulder-R MR wo contrast: No results found for this or any previous visit.  Shoulder-L MR wo contrast: No results found for this or any previous visit.  Shoulder-R CT w contrast: No results found for this or any previous visit.  Shoulder-L CT w contrast: No results found for this or any previous visit.  Shoulder-R CT w/wo contrast: No results found for this or any previous visit.  Shoulder-L CT w/wo contrast: No results found for this or any previous visit.  Shoulder-R CT wo contrast: No results found for this or any previous visit.  Shoulder-L CT wo contrast: No results found for this or any previous visit.  Shoulder-R DG Arthrogram: No results found for this or any previous visit.  Shoulder-L DG Arthrogram: No results found for this or any previous visit.  Shoulder-R DG 1 view: No results found for this or any previous visit.  Shoulder-L DG 1 view: No results found for this or any previous visit.  Shoulder-R DG: No results found for this or any previous visit.  Shoulder-L DG: No results found for this or any previous visit.   Thoracic Imaging: Thoracic MR wo contrast: No results found for this or any previous visit.  Thoracic MR wo contrast: No valid  procedures specified. Thoracic MR w/wo contrast: No results found for this or any previous visit.  Thoracic MR w contrast: No results found for this or any previous visit.  Thoracic CT wo contrast: No results found for this or any previous visit.  Thoracic CT w/wo contrast: No results found for this or any previous visit.  Thoracic CT w/wo contrast: No results found for this or any previous visit.  Thoracic CT w contrast: No results found for this or any previous visit.  Thoracic DG 2-3 views: No results found for this or any previous visit.  Thoracic DG 4 views: No results found for this or any previous visit.  Thoracic DG: No results found for this or any previous visit.  Thoracic DG w/swimmers view: No results found for this or any previous visit.  Thoracic DG Myelogram views: No results found for this or any previous visit.  Thoracic DG Myelogram views: No results found for this or any previous visit.   Lumbosacral Imaging: Lumbar MR wo contrast: Results for orders placed during the hospital encounter of 11/07/21  MR LUMBAR SPINE WO CONTRAST  Narrative CLINICAL DATA:  Spinal stenosis of lumbosacral region M48.07 (ICD-10-CM). Lumbar spondylosis M47.816 (ICD-10-CM).  EXAM: MRI LUMBAR SPINE WITHOUT CONTRAST  TECHNIQUE: Multiplanar, multisequence MR imaging of the lumbar spine was performed. No intravenous contrast was administered.  COMPARISON:  Radiographs June 19, 2021.  FINDINGS: Segmentation:  Standard.  Alignment:  Trace anterolisthesis at L4-5.  Vertebrae:  No fracture, evidence of discitis, or bone lesion.  Conus medullaris and cauda equina: Conus extends to the T12 level. Conus and cauda equina appear normal.  Paraspinal and other soft tissues: Postsurgical changes in the posterior paraspinal soft tissues.  Disc levels:  T12-L1: No spinal canal or neural foraminal stenosis.  L1-2: Shallow disc bulge and mild facet degenerative changes  without significant spinal canal or neural foraminal stenosis.  L2-3: Shallow disc bulge and mild-to-moderate facet degenerative changes with ligamentum flavum redundancy without significant spinal or neural foraminal stenosis.  L3-4: Disc bulge with superimposed small right foraminal disc protrusion, moderate hypertrophic facet degenerative changes and ligamentum flavum redundancy resulting in mild spinal canal stenosis and mild right neural foraminal narrowing.  L4-5: Disc bulge, moderate to advanced hypertrophic facet degenerative changes with trace left facet effusion and ligamentum flavum redundancy resulting in mild  spinal canal Sten. No significant neural.  L5-S1: Postsurgical changes from left laminotomy. Shallow disc bulge with a left foraminal/far lateral disc osteophyte complex and mild facet degenerative changes resulting in mild left neural foraminal. No significant spinal canal stenosis.  IMPRESSION: 1. Degenerative changes of the lumbar spine, more pronounced at the level of the facet joints at L3-4 and L4-5. No high-grade spinal canal or neural foraminal stenosis at any level.   Electronically Signed By: Baldemar Lenis M.D. On: 11/10/2021 10:00  Lumbar MR wo contrast: No valid procedures specified. Lumbar MR w/wo contrast: No results found for this or any previous visit.  Lumbar MR w/wo contrast: No results found for this or any previous visit.  Lumbar MR w contrast: No results found for this or any previous visit.  Lumbar CT wo contrast: No results found for this or any previous visit.  Lumbar CT w/wo contrast: No results found for this or any previous visit.  Lumbar CT w/wo contrast: No results found for this or any previous visit.  Lumbar CT w contrast: No results found for this or any previous visit.  Lumbar DG 1V: No results found for this or any previous visit.  Lumbar DG 1V (Clearing): No results found for this or any previous  visit.  Lumbar DG 2-3V (Clearing): No results found for this or any previous visit.  Lumbar DG 2-3 views: Results for orders placed during the hospital encounter of 06/19/21  DG Lumbar Spine 2-3 Views  Narrative CLINICAL DATA:  Low back pain, right leg weakness  EXAM: LUMBAR SPINE - 2-3 VIEW  COMPARISON:  07/11/2019  FINDINGS: There is no evidence of lumbar spine fracture. Minimal grade 1 anterolisthesis L4 on L5. Mild disc height loss at L5-S1. Mild lower lumbar facet arthropathy.  IMPRESSION: Mild lower lumbar spondylosis.  No acute findings.   Electronically Signed By: Duanne Guess D.O. On: 06/20/2021 12:07  Lumbar DG (Complete) 4+V: No results found for this or any previous visit.        Lumbar DG F/E views: No results found for this or any previous visit.        Lumbar DG Bending views: No results found for this or any previous visit.        Lumbar DG Myelogram views: No results found for this or any previous visit.  Lumbar DG Myelogram: No results found for this or any previous visit.  Lumbar DG Myelogram: No results found for this or any previous visit.  Lumbar DG Myelogram: No results found for this or any previous visit.  Lumbar DG Myelogram Lumbosacral: No results found for this or any previous visit.  Lumbar DG Diskogram views: No results found for this or any previous visit.  Lumbar DG Diskogram views: No results found for this or any previous visit.  Lumbar DG Epidurogram OP: No results found for this or any previous visit.  Lumbar DG Epidurogram IP: No valid procedures specified.  Sacroiliac Joint Imaging: Sacroiliac Joint DG: No results found for this or any previous visit.  Sacroiliac Joint MR w/wo contrast: No results found for this or any previous visit.  Sacroiliac Joint MR wo contrast: No results found for this or any previous visit.   Spine Imaging: Whole Spine DG Myelogram views: No results found for this or any previous  visit.  Whole Spine MR Mets screen: No results found for this or any previous visit.  Whole Spine MR Mets screen: No results found for this or any previous  visit.  Whole Spine MR w/wo: No results found for this or any previous visit.  MRA Spinal Canal w/ cm: No results found for this or any previous visit.  MRA Spinal Canal wo/ cm: No valid procedures specified. MRA Spinal Canal w/wo cm: No results found for this or any previous visit.  Spine Outside MR Films: No results found for this or any previous visit.  Spine Outside CT Films: No results found for this or any previous visit.  CT-Guided Biopsy: No results found for this or any previous visit.  CT-Guided Needle Placement: No results found for this or any previous visit.  DG Spine outside: No results found for this or any previous visit.  IR Spine outside: No results found for this or any previous visit.  NM Spine outside: No results found for this or any previous visit.   Hip Imaging: Hip-R MR w contrast: No results found for this or any previous visit.  Hip-L MR w contrast: No results found for this or any previous visit.  Hip-R MR w/wo contrast: No results found for this or any previous visit.  Hip-L MR w/wo contrast: No results found for this or any previous visit.  Hip-R MR wo contrast: No results found for this or any previous visit.  Hip-L MR wo contrast: No results found for this or any previous visit.  Hip-R CT w contrast: No results found for this or any previous visit.  Hip-L CT w contrast: No results found for this or any previous visit.  Hip-R CT w/wo contrast: No results found for this or any previous visit.  Hip-L CT w/wo contrast: No results found for this or any previous visit.  Hip-R CT wo contrast: No results found for this or any previous visit.  Hip-L CT wo contrast: No results found for this or any previous visit.  Hip-R DG 2-3 views: Results for orders placed during the hospital encounter  of 06/19/21  DG HIP UNILAT WITH PELVIS 2-3 VIEWS RIGHT  Narrative CLINICAL DATA:  Bilateral hip pain  EXAM: DG HIP (WITH OR WITHOUT PELVIS) 2-3V LEFT; DG HIP (WITH OR WITHOUT PELVIS) 2-3V RIGHT  COMPARISON:  09/28/2012  FINDINGS: There is no evidence of hip fracture or dislocation. Relatively mild joint space narrowing of both hips. Pelvic bony ring intact.  IMPRESSION: Mild degenerative changes of the bilateral hips.   Electronically Signed By: Duanne Guess D.O. On: 06/20/2021 12:06  Hip-L DG 2-3 views: Results for orders placed during the hospital encounter of 06/19/21  DG HIP UNILAT WITH PELVIS 2-3 VIEWS LEFT  Narrative CLINICAL DATA:  Bilateral hip pain  EXAM: DG HIP (WITH OR WITHOUT PELVIS) 2-3V LEFT; DG HIP (WITH OR WITHOUT PELVIS) 2-3V RIGHT  COMPARISON:  09/28/2012  FINDINGS: There is no evidence of hip fracture or dislocation. Relatively mild joint space narrowing of both hips. Pelvic bony ring intact.  IMPRESSION: Mild degenerative changes of the bilateral hips.   Electronically Signed By: Duanne Guess D.O. On: 06/20/2021 12:06  Hip-R DG Arthrogram: No results found for this or any previous visit.  Hip-L DG Arthrogram: No results found for this or any previous visit.  Hip-B DG Bilateral: No results found for this or any previous visit.   Knee Imaging: Knee-R MR w contrast: No results found for this or any previous visit.  Knee-L MR w/o contrast: No results found for this or any previous visit.  Knee-R MR w/wo contrast: No results found for this or any previous visit.  Knee-L  MR w/wo contrast: No results found for this or any previous visit.  Knee-R MR wo contrast: No results found for this or any previous visit.  Knee-L MR wo contrast: No results found for this or any previous visit.  Knee-R CT w contrast: No results found for this or any previous visit.  Knee-L CT w contrast: No results found for this or any previous  visit.  Knee-R CT w/wo contrast: No results found for this or any previous visit.  Knee-L CT w/wo contrast: No results found for this or any previous visit.  Knee-R CT wo contrast: No results found for this or any previous visit.  Knee-L CT wo contrast: No results found for this or any previous visit.  Knee-R DG 1-2 views: No results found for this or any previous visit.  Knee-L DG 1-2 views: No results found for this or any previous visit.  Knee-R DG 3 views: No results found for this or any previous visit.  Knee-L DG 3 views: No results found for this or any previous visit.  Knee-R DG 4 views: No results found for this or any previous visit.  Knee-L DG 4 views: No results found for this or any previous visit.  Knee-R DG Arthrogram: No results found for this or any previous visit.  Knee-L DG Arthrogram: No results found for this or any previous visit.   Ankle Imaging: Ankle-R DG Complete: No results found for this or any previous visit.  Ankle-L DG Complete: No results found for this or any previous visit.   Foot Imaging: Foot-R DG Complete: Results for orders placed in visit on 04/15/21  DG Foot Complete Right  Narrative Please see detailed radiograph report in office note.  Foot-L DG Complete: No results found for this or any previous visit.   Elbow Imaging: Elbow-R DG Complete: No results found for this or any previous visit.  Elbow-L DG Complete: No results found for this or any previous visit.   Wrist Imaging: Wrist-R DG Complete: No results found for this or any previous visit.  Wrist-L DG Complete: Results for orders placed during the hospital encounter of 08/13/15  DG Wrist Complete Left  Narrative CLINICAL DATA:  Left wrist pain centered over the radial styloid for the past 3-4 weeks without known injury unable to make a fist due to pain.  EXAM: LEFT WRIST - COMPLETE 3+ VIEW  COMPARISON:  None in PACs  FINDINGS: The bones of the left wrist  are adequately mineralized. There is no acute fracture nor dislocation. Specific attention to the scaphoid reveals no acute abnormality. The radiocarpal and ulnocarpal joint spaces are preserved. The radial styloid is intact. There is very mild degenerative change of the first carpometacarpal joint.  IMPRESSION: There is no acute fracture nor dislocation of the bones of the left wrist.   Electronically Signed By: David  [place] M.D. On: 08/13/2015 12:55   Hand Imaging: Hand-R DG Complete: No results found for this or any previous visit.  Hand-L DG Complete: No results found for this or any previous visit.   Complexity Note: Imaging results reviewed.                         ROS  Cardiovascular: {Hx; Cardiovascular History:210120525} Pulmonary or Respiratory: {Hx; Pumonary and/or Respiratory History:210120523} Neurological: {Hx; Neurological:210120504} Psychological-Psychiatric: {Hx; Psychological-Psychiatric History:210120512} Gastrointestinal: {Hx; Gastrointestinal:210120527} Genitourinary: {Hx; Genitourinary:210120506} Hematological: {Hx; Hematological:210120510} Endocrine: {Hx; Endocrine history:210120509} Rheumatologic: {Hx; Rheumatological:210120530} Musculoskeletal: {Hx; Musculoskeletal:210120528} Work History: {Hx; Work history:210120514}  Allergies  Ms. Pekrul is allergic to darvocet [propoxyphene n-acetaminophen], latex, penicillins, cephalexin, and enalapril.  Laboratory Chemistry Profile   Renal Lab Results  Component Value Date   BUN 14 05/19/2022   CREATININE 0.89 05/19/2022   BCR 16 05/19/2022   GFRAA 105 03/18/2020   GFRNONAA 91 03/18/2020   SPECGRAV 1.014 12/26/2019   PHUR 6.5 12/26/2019   PROTEINUR Negative 12/26/2019     Electrolytes Lab Results  Component Value Date   NA 140 05/19/2022   K 4.3 05/19/2022   CL 102 05/19/2022   CALCIUM 10.5 (H) 05/19/2022     Hepatic Lab Results  Component Value Date   AST 15 05/19/2022   ALT 11  05/19/2022   ALBUMIN 4.3 05/19/2022   ALKPHOS 83 05/19/2022     ID Lab Results  Component Value Date   SARSCOV2NAA NEGATIVE 07/26/2019     Bone Lab Results  Component Value Date   VD25OH 84.7 03/18/2020     Endocrine Lab Results  Component Value Date   GLUCOSE 112 (H) 05/19/2022   GLUCOSEU Negative 12/26/2019   HGBA1C 5.8 (H) 05/19/2022   TSH 0.627 05/19/2022   FREET4 1.04 04/09/2021     Neuropathy Lab Results  Component Value Date   VITAMINB12 911 11/05/2020   HGBA1C 5.8 (H) 05/19/2022     CNS No results found for: "COLORCSF", "APPEARCSF", "RBCCOUNTCSF", "WBCCSF", "POLYSCSF", "LYMPHSCSF", "EOSCSF", "PROTEINCSF", "GLUCCSF", "JCVIRUS", "CSFOLI", "IGGCSF", "LABACHR", "ACETBL"   Inflammation (CRP: Acute  ESR: Chronic) No results found for: "CRP", "ESRSEDRATE", "LATICACIDVEN"   Rheumatology No results found for: "RF", "ANA", "LABURIC", "URICUR", "LYMEIGGIGMAB", "LYMEABIGMQN", "HLAB27"   Coagulation Lab Results  Component Value Date   INR 1.03 01/31/2014   LABPROT 13.5 01/31/2014   APTT 25 01/31/2014   PLT 117 (L) 05/19/2022     Cardiovascular Lab Results  Component Value Date   HGB 10.2 (L) 05/19/2022   HCT 32.4 (L) 05/19/2022     Screening Lab Results  Component Value Date   SARSCOV2NAA NEGATIVE 07/26/2019     Cancer No results found for: "CEA", "CA125", "LABCA2"   Allergens No results found for: "ALMOND", "APPLE", "ASPARAGUS", "AVOCADO", "BANANA", "BARLEY", "BASIL", "BAYLEAF", "GREENBEAN", "LIMABEAN", "WHITEBEAN", "BEEFIGE", "REDBEET", "BLUEBERRY", "BROCCOLI", "CABBAGE", "MELON", "CARROT", "CASEIN", "CASHEWNUT", "CAULIFLOWER", "CELERY"     Note: Lab results reviewed.  PFSH  Drug: Ms. Goldhammer  reports no history of drug use. Alcohol:  reports no history of alcohol use. Tobacco:  reports that she has never smoked. She has never used smokeless tobacco. Medical:  has a past medical history of Cancer (HCC), Edema leg, GERD (gastroesophageal reflux  disease), History of kidney stones, Hyperlipidemia, Hypertension, Neuropathy, and Wears glasses. Family: family history includes Asthma in her mother; Cancer in her cousin, cousin, cousin, and maternal aunt; Diabetes in her father and mother; Heart disease in her father.  Past Surgical History:  Procedure Laterality Date   ABDOMINAL HYSTERECTOMY  1986   AXILLARY LYMPH NODE DISSECTION Left 06/26/2014   Procedure: AXILLARY LYMPH NODE DISSECTION;  Surgeon: Ovidio Kin, MD;  Location: [facility];  Service: General;  Laterality: Left;   BACK SURGERY  1990, 1991   lumbar   BREAST SURGERY Left 06/2014   CATARACT EXTRACTION W/PHACO Right 03/23/2019   Procedure: CATARACT EXTRACTION PHACO AND INTRAOCULAR LENS PLACEMENT (IOC) RIGHT VISION BLUE;  Surgeon: Elliot Cousin, MD;  Location: [facility];  Service: Ophthalmology;  Laterality: Right;  [place] 00:55.4 CDE 5.92 Fluid Pack lot # [id number]   COLONOSCOPY     COLONOSCOPY  WITH PROPOFOL N/A 12/29/2017   Procedure: COLONOSCOPY WITH PROPOFOL;  Surgeon: Toledo, Boykin Nearing, MD;  Location: [facility];  Service: Gastroenterology;  Laterality: N/A;   CYSTOSCOPY/URETEROSCOPY/HOLMIUM LASER/STENT PLACEMENT Bilateral 07/28/2019   Procedure: CYSTOSCOPY/URETEROSCOPY/HOLMIUM LASER/STENT PLACEMENT;  Surgeon: Sondra Come, MD;  Location: [facility];  Service: Urology;  Laterality: Bilateral;   DENTAL SURGERY Right    top    DILATION AND CURETTAGE OF UTERUS     LAPAROSCOPIC GASTRIC BANDING  10/19/05   MASTECTOMY Left 2016   MASTECTOMY MODIFIED RADICAL Left 06/26/2014   Procedure: MASTECTOMY MODIFIED RADICAL;  Surgeon: Ovidio Kin, MD;  Location: [facility];  Service: General;  Laterality: Left;   PORT A CATH REVISION     insertion-10/15   PORT-A-CATH REMOVAL N/A 02/07/2015   Procedure: MINOR REMOVAL PORT-A-CATH;  Surgeon: Ovidio Kin, MD;  Location: [facility];  Service: General;  Laterality: N/A;   Active Ambulatory  Problems    Diagnosis Date Noted   History of laparoscopic adjustable gastric banding, 10/19/2005. 02/03/2013   Breast cancer of upper-inner quadrant of left female breast (HCC) 01/19/2014   Constipation 02/16/2014   Neuropathy due to chemotherapeutic drug (HCC) 11/07/2014   Dysuria 10/14/2017   Encounter for general adult medical examination with abnormal findings 10/14/2017   Essential hypertension 10/14/2017   Primary insomnia 10/14/2017   Primary osteoarthritis of knees, bilateral 10/14/2017   Stomatitis and mucositis 11/03/2017   Right sided sciatica 05/29/2018   Cough 07/04/2018   Low back pain 12/11/2018   Lower abdominal pain 07/03/2019   Multinodular goiter 12/24/2019   History of left breast cancer 12/26/2019   Hyperlipidemia 04/22/2020   Encounter to establish care 08/11/2020   Tooth, impacted 08/11/2020   Dizziness 11/10/2020   Other fatigue 11/10/2020   History of iron deficiency anemia 11/10/2020   Impaired fasting glucose 11/10/2020   Leg cramps 11/10/2020   Encounter for screening mammogram for malignant neoplasm of breast 11/10/2020   Peroneal tendonitis of right lower extremity 04/09/2021   Fall at home 06/28/2021   Lumbosacral radiculopathy 06/28/2021   Pain in unspecified hip 06/28/2021   Restless legs 12/28/2021   BMI 24.0-24.9, adult 12/28/2021   BMI 34.0-34.9,adult 06/26/2022   Resolved Ambulatory Problems    Diagnosis Date Noted   Malignant neoplasm of upper-outer quadrant of female breast (HCC) 01/31/2014   Breast cancer, female (HCC) 06/26/2014   Acute cystitis without hematuria 10/14/2017   Urinary tract infection without hematuria 11/03/2017   Uncontrolled type 2 diabetes mellitus with hyperglycemia (HCC) 11/24/2017   Acute upper respiratory infection 07/04/2018   Influenza 07/04/2018   At risk for infectious disease due to recent foreign travel 07/04/2018   Acute otitis externa of left ear 08/04/2018   Vaginal candidiasis 08/11/2020   Past  Medical History:  Diagnosis Date   Cancer (HCC)    Edema leg    GERD (gastroesophageal reflux disease)    History of kidney stones    Hypertension    Neuropathy    Wears glasses    Constitutional Exam  General appearance: Well nourished, well developed, and well hydrated. In no apparent acute distress There were no vitals filed for this visit. BMI Assessment: Estimated body mass index is 34.44 kg/m as calculated from the following:   Height as of 09/24/22: 5\' 3"  (1.6 m).   Weight as of 09/24/22: 194 lb 6.4 oz (88.2 kg).  BMI interpretation table: BMI level Category Range association with higher incidence of chronic pain  <18 kg/m2 Underweight  18.5-24.9 kg/m2 Ideal body weight   25-29.9 kg/m2 Overweight Increased incidence by 20%  30-34.9 kg/m2 Obese (Class I) Increased incidence by 68%  35-39.9 kg/m2 Severe obesity (Class II) Increased incidence by 136%  >40 kg/m2 Extreme obesity (Class III) Increased incidence by 254%   Patient's current BMI Ideal Body weight  There is no height or weight on file to calculate BMI. Patient weight not recorded   BMI Readings from Last 4 Encounters:  09/24/22 34.44 kg/m  05/26/22 34.08 kg/m  12/12/21 34.26 kg/m  06/19/21 35.14 kg/m   Wt Readings from Last 4 Encounters:  09/24/22 194 lb 6.4 oz (88.2 kg)  05/26/22 192 lb 6.4 oz (87.3 kg)  12/12/21 193 lb 6.4 oz (87.7 kg)  06/19/21 198 lb 6.4 oz (90 kg)    Psych/Mental status: Alert, oriented x 3 (person, place, & time)       Eyes: PERLA Respiratory: No evidence of acute respiratory distress  Assessment  Primary Diagnosis & Pertinent Problem List: {There were no encounter diagnoses. (Refresh or delete this SmartLink)}  Visit Diagnosis (New problems to examiner): No diagnosis found. Plan of Care (Initial workup plan)  Note: Ms. Hamman was reminded that as per protocol, today's visit has been an evaluation only. We have not taken over the patient's controlled substance  management.  Problem-specific plan: No problem-specific Assessment & Plan notes found for this encounter. Lab Orders  No laboratory test(s) ordered today   Imaging Orders  No imaging studies ordered today   Referral Orders  No referral(s) requested today   Procedure Orders    No procedure(s) ordered today   Pharmacotherapy (current): Medications ordered:  No orders of the defined types were placed in this encounter.  Medications administered during this visit: Liane Comber. Dorer had no medications administered during this visit.   Analgesic Pharmacotherapy:  Opioid Analgesics: For patients currently taking or requesting to take opioid analgesics, in accordance with [facility] Guidelines, we will assess their risks and indications for the use of these substances. After completing our evaluation, we may offer recommendations, but we no longer take patients for medication management. The prescribing physician will ultimately decide, based on his/her training and level of comfort whether to adopt any of the recommendations, including whether or not to prescribe such medicines.  Membrane stabilizer: To be determined at a later time  Muscle relaxant: To be determined at a later time  NSAID: To be determined at a later time  Other analgesic(s): To be determined at a later time   Interventional management options: Ms. Gabehart was informed that there is no guarantee that she would be a candidate for interventional therapies. The decision will be based on the results of diagnostic studies, as well as Ms. Slaby's risk profile.  Procedure(s) under consideration:  Pending results of ordered studies      Interventional Therapies  Risk Factors  Considerations  Medical Comorbidities:     Planned  Pending:      Under consideration:   Pending   Completed:   None at this time   Therapeutic  Palliative (PRN) options:   None established   Completed by other  providers:   None reported       Provider-requested follow-up: No follow-ups on file.  Future Appointments  Date Time Provider Department Center  12/02/2022  9:00 AM Delano Metz, MD [facility] None    Duration of encounter: *** minutes.  Total time on encounter, as per AMA guidelines included both the face-to-face and  non-face-to-face time personally spent by the physician and/or other qualified health care professional(s) on the day of the encounter (includes time in activities that require the physician or other qualified health care professional and does not include time in activities normally performed by clinical staff). Physician's time may include the following activities when performed: Preparing to see the patient (e.g., pre-charting review of records, searching for previously ordered imaging, lab work, and nerve conduction tests) Review of prior analgesic pharmacotherapies. Reviewing PMP Interpreting ordered tests (e.g., lab work, imaging, nerve conduction tests) Performing post-procedure evaluations, including interpretation of diagnostic procedures Obtaining and/or reviewing separately obtained history Performing a medically appropriate examination and/or evaluation Counseling and educating the patient/family/caregiver Ordering medications, tests, or procedures Referring and communicating with other health care professionals (when not separately reported) Documenting clinical information in the electronic or other health record Independently interpreting results (not separately reported) and communicating results to the patient/ family/caregiver Care coordination (not separately reported)  Note by: Oswaldo Done, MD (TTS technology used. I apologize for any typographical errors that were not detected and corrected.) Date: 12/02/2022; Time: 8:00 AM

## 2022-12-01 DIAGNOSIS — G894 Chronic pain syndrome: Secondary | ICD-10-CM | POA: Insufficient documentation

## 2022-12-01 DIAGNOSIS — Z789 Other specified health status: Secondary | ICD-10-CM | POA: Insufficient documentation

## 2022-12-01 DIAGNOSIS — Z79899 Other long term (current) drug therapy: Secondary | ICD-10-CM | POA: Insufficient documentation

## 2022-12-01 DIAGNOSIS — M899 Disorder of bone, unspecified: Secondary | ICD-10-CM | POA: Insufficient documentation

## 2022-12-02 ENCOUNTER — Ambulatory Visit (HOSPITAL_BASED_OUTPATIENT_CLINIC_OR_DEPARTMENT_OTHER): Payer: PPO | Admitting: Pain Medicine

## 2022-12-02 DIAGNOSIS — Z79899 Other long term (current) drug therapy: Secondary | ICD-10-CM

## 2022-12-02 DIAGNOSIS — Z789 Other specified health status: Secondary | ICD-10-CM

## 2022-12-02 DIAGNOSIS — M899 Disorder of bone, unspecified: Secondary | ICD-10-CM

## 2022-12-02 DIAGNOSIS — G894 Chronic pain syndrome: Secondary | ICD-10-CM

## 2022-12-02 DIAGNOSIS — Z91199 Patient's noncompliance with other medical treatment and regimen due to unspecified reason: Secondary | ICD-10-CM

## 2022-12-02 DIAGNOSIS — G8929 Other chronic pain: Secondary | ICD-10-CM

## 2022-12-20 ENCOUNTER — Other Ambulatory Visit: Payer: Self-pay | Admitting: Nurse Practitioner

## 2022-12-20 DIAGNOSIS — I10 Essential (primary) hypertension: Secondary | ICD-10-CM

## 2023-01-07 ENCOUNTER — Other Ambulatory Visit: Payer: Self-pay | Admitting: Hematology and Oncology

## 2023-01-07 DIAGNOSIS — D37030 Neoplasm of uncertain behavior of the parotid salivary glands: Secondary | ICD-10-CM | POA: Diagnosis not present

## 2023-01-07 DIAGNOSIS — E041 Nontoxic single thyroid nodule: Secondary | ICD-10-CM | POA: Diagnosis not present

## 2023-01-07 DIAGNOSIS — Z1231 Encounter for screening mammogram for malignant neoplasm of breast: Secondary | ICD-10-CM

## 2023-01-08 ENCOUNTER — Other Ambulatory Visit: Payer: Self-pay | Admitting: Otolaryngology

## 2023-01-08 DIAGNOSIS — E041 Nontoxic single thyroid nodule: Secondary | ICD-10-CM

## 2023-01-18 ENCOUNTER — Other Ambulatory Visit: Payer: Self-pay | Admitting: Otolaryngology

## 2023-01-18 ENCOUNTER — Ambulatory Visit
Admission: RE | Admit: 2023-01-18 | Discharge: 2023-01-18 | Disposition: A | Discharge status: Home / Self Care | Payer: PPO | Source: Ambulatory Visit | Attending: Otolaryngology | Admitting: Otolaryngology

## 2023-01-18 DIAGNOSIS — E041 Nontoxic single thyroid nodule: Secondary | ICD-10-CM

## 2023-01-18 DIAGNOSIS — M542 Cervicalgia: Secondary | ICD-10-CM | POA: Diagnosis not present

## 2023-01-19 ENCOUNTER — Ambulatory Visit (INDEPENDENT_AMBULATORY_CARE_PROVIDER_SITE_OTHER): Payer: PPO | Admitting: Family Medicine

## 2023-01-19 ENCOUNTER — Encounter: Payer: Self-pay | Admitting: Family Medicine

## 2023-01-19 VITALS — BP 164/81 | HR 84 | Ht 63.0 in | Wt 200.4 lb

## 2023-01-19 DIAGNOSIS — I10 Essential (primary) hypertension: Secondary | ICD-10-CM

## 2023-01-19 DIAGNOSIS — R6884 Jaw pain: Secondary | ICD-10-CM | POA: Insufficient documentation

## 2023-01-19 MED ORDER — OXYCODONE HCL 5 MG PO TABS
5.0000 mg | ORAL_TABLET | Freq: Four times a day (QID) | ORAL | 0 refills | Status: AC | PRN
Start: 2023-01-19 — End: 2023-01-24

## 2023-01-19 NOTE — Progress Notes (Unsigned)
Established Patient Office Visit  Subjective   Patient ID: Regina Deleon, female    DOB: April 29, 1951  Age: 72 y.o. MRN: [id number]  Chief Complaint  Patient presents with   Neck Pain    HPI   Patient saw Dr. Willeen Cass, ENT, regarding her left sided jaw pain.  Ultrasound was performed yesterday, results not read.  She states she talked to him about potentially needing surgery for parotid gland enlargement.  Patient does not want surgery.  She is worried about the risks.  We discussed the potential risks of surgery at her age.  Discussed that if she delays surgery now, the risks will only increase as she gets older.  Discussed the risks of long-term pain medications for jaw pain.  Patient does not want surgery.  Patient takes Ambien as needed for sleep.  She was advised not to take sedating medication such as Ambien and muscle relaxants at the same time as her opioid pain medication.  She expressed understanding.  States she does not need the pain medicine every day.  She states that NSAIDs in the past have caused gastritis.  She had an appointment with the pain specialist last month but did not go because after looking at what they offered her if she thought it was "too experimental".  Hypertension-says she did not take her medication this morning.  Usually takes it regularly.  Is taking both medications that are prescribed to her.  Advised her to follow-up with her PCP next month regarding changes to her blood pressure medication.   The 10-year ASCVD risk score (Arnett DK, et al., 2019) is: 54.8%  Health Maintenance Due  Topic Date Due   DTaP/Tdap/Td (1 - Tdap) Never done   Pneumonia Vaccine 66+ Years old (2 of 2 - PCV) 06/16/2017   INFLUENZA VACCINE  12/03/2022   COVID-19 Vaccine (4 - 2023-24 season) 01/03/2023      Objective:     BP (!) 164/81   Pulse 84   Ht 5\' 3"  (1.6 m)   Wt 200 lb 6.4 oz (90.9 kg)   LMP 06/02/1984   BMI 35.50 kg/m  {Vitals History  (Optional):23777}  Physical Exam General: Alert and oriented no acute distress HEENT: Minimal enlargement/swelling of the left jaw along the angle of the mandible.  No lymphadenopathy detected.   No results found for any visits on 01/19/23.      Assessment & Plan:   Jaw pain Assessment & Plan: Patient has seen an ear nose and throat doctor and had an ultrasound of the neck yesterday.  Read is not available yet.  I had a long discussion about benefit and consequence to surgery versus pain management.  Somewhat limited in my ability to provide advice as I am not sure if we are talking about a parotid gland mass some other finding.  I do not have the ENT notes nor the imaging results.  She has already talked to the otolaryngologist specialist about the possibility of surgical treatment.  Patient does not appear to be interested in surgery at this time.  I advised her that I can give her some short-term pain medication but long-term pain medications would need to be discussed with her primary care provider.   Essential hypertension Assessment & Plan: Patient states she has not taken her medication yet.  Blood pressure was elevated today.  Has a follow-up appointment with her PCP at 1 month.  If elevated again at that time she can discuss changes to blood pressure  medication.   Other orders -     oxyCODONE HCl; Take 1 tablet (5 mg total) by mouth every 6 (six) hours as needed for up to 5 days for severe pain.  Dispense: 15 tablet; Refill: 0     No follow-ups on file.    Sandre Kitty, MD

## 2023-01-19 NOTE — Patient Instructions (Signed)
It was nice to see you today,  We addressed the following topics today: -We will send in a prescription for oxycodone 5 mg to take as needed every 6 hours.  If you need a long-term prescription for medication you will need to talk to [facility] in October when you see her. - Follow-up with your ear nose and throat specialist in regards to the results of your imaging.  Have a great day,  Frederic Jericho, MD

## 2023-01-19 NOTE — Assessment & Plan Note (Signed)
Patient has seen an ear nose and throat doctor and had an ultrasound of the neck yesterday.  Read is not available yet.  I had a long discussion about benefit and consequence to surgery versus pain management.  Somewhat limited in my ability to provide advice as I am not sure if we are talking about a parotid gland mass some other finding.  I do not have the ENT notes nor the imaging results.  She has already talked to the otolaryngologist specialist about the possibility of surgical treatment.  Patient does not appear to be interested in surgery at this time.  I advised her that I can give her some short-term pain medication but long-term pain medications would need to be discussed with her primary care provider.

## 2023-01-19 NOTE — Assessment & Plan Note (Signed)
Patient states she has not taken her medication yet.  Blood pressure was elevated today.  Has a follow-up appointment with her PCP at 1 month.  If elevated again at that time she can discuss changes to blood pressure medication.

## 2023-01-20 NOTE — Progress Notes (Signed)
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  [address]

## 2023-01-21 ENCOUNTER — Ambulatory Visit (INDEPENDENT_AMBULATORY_CARE_PROVIDER_SITE_OTHER): Payer: PPO | Admitting: Family Medicine

## 2023-01-21 VITALS — BP 125/73 | HR 80 | Ht 63.0 in | Wt 198.9 lb

## 2023-01-21 DIAGNOSIS — E782 Mixed hyperlipidemia: Secondary | ICD-10-CM | POA: Diagnosis not present

## 2023-01-21 DIAGNOSIS — G620 Drug-induced polyneuropathy: Secondary | ICD-10-CM | POA: Diagnosis not present

## 2023-01-21 DIAGNOSIS — I10 Essential (primary) hypertension: Secondary | ICD-10-CM

## 2023-01-21 DIAGNOSIS — Z23 Encounter for immunization: Secondary | ICD-10-CM

## 2023-01-21 DIAGNOSIS — F5101 Primary insomnia: Secondary | ICD-10-CM | POA: Diagnosis not present

## 2023-01-21 DIAGNOSIS — Z7689 Persons encountering health services in other specified circumstances: Secondary | ICD-10-CM

## 2023-01-21 DIAGNOSIS — T451X5A Adverse effect of antineoplastic and immunosuppressive drugs, initial encounter: Secondary | ICD-10-CM | POA: Diagnosis not present

## 2023-01-21 DIAGNOSIS — G894 Chronic pain syndrome: Secondary | ICD-10-CM

## 2023-01-21 NOTE — Assessment & Plan Note (Signed)
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 Gabapentin 100mg  is taken as needed for pain. -Continue Gabapentin 100mg  as needed for neuropathy pain.

## 2023-01-21 NOTE — Assessment & Plan Note (Signed)
Welcomed patient to [facility]  Reviewed patient's medical history, medications, surgical and social history Discussed roles and expectations for primary care physician-patient relationship Recommended patient schedule annual preventative examinations

## 2023-01-21 NOTE — Assessment & Plan Note (Signed)
History of two back surgeries and a current bulging disc on the left side. Robaxin 500mg  is taken as needed for muscle spasms. -Continue Robaxin 500mg  as needed for muscle spasms.

## 2023-01-21 NOTE — Progress Notes (Signed)
New patient visit   Patient: Regina Deleon   DOB: 1950/08/20   72 y.o. Female  MRN: [id number] Visit Date: 01/21/2023  Today's healthcare provider: Ronnald Ramp, MD   Chief Complaint  Patient presents with   Establish Care    Just need a new provider   Subjective    ANNAELIZABETH Deleon is a 72 y.o. female who presents today as a new patient to establish care.   HPI     Establish Care    Additional comments: Just need a new provider      Last edited by Clois Comber on 01/21/2023  8:30 AM.       Discussed the use of AI scribe software for clinical note transcription with the patient, who gave verbal consent to proceed.  History of Present Illness   The patient, a 72 year old with a history of mastectomy for breast cancer, hypertension, and back surgeries, presents with a chief complaint of facial pain. The pain, localized to the left side of the face, was initially evaluated by an otolaryngologist, Dr. Willeen Cass, who identified a small nodule in the salivary gland. The patient reports that the pain has improved since starting on oxycodone, and has not required any additional doses of the medication since the initial dose. The patient has declined surgical intervention for the nodule, citing age and personal preference.  The patient also reports a history of neuropathy in the right leg, a side effect from a cancer treatment received a decade ago. The neuropathy is managed with gabapentin as needed, which the patient reports causes vivid dreams. The patient also has a history of insomnia, managed with Ambien.  The patient has a history of kidney stones, for which they were previously prescribed Flomax and vitamin D3, but is no longer taking these medications. The patient also reports a history of reflux and diverticulosis, but these conditions have not been problematic recently.  The patient's hypertension is well-controlled with nifedipine and Maxzide. The patient  also takes Crestor for cholesterol management. The patient has a history of glaucoma, managed with eye drops, and has had two back surgeries. The patient reports occasional muscle spasms, for which they take Robaxin as needed.  The patient has a history of cancer, for which they received a mastectomy and have been cancer-free for 10 years. The patient is due for a mammogram in October. The patient also reports a history of sinus issues, managed with Zyrtec as needed. The patient has a history of yeast infections following antibiotic use, managed with Diflucan as needed.  The patient reports a fall last year, resulting in a bulky disc on the left side of the back. The patient has declined referral to a pain clinic, managing the pain with over-the-counter medications as needed. The patient also uses Voltaren gel for pain management. The patient has a history of constipation, managed with Colace as needed. The patient also takes omega-3 supplements by personal choice.          Past Medical History:  Diagnosis Date   Cancer (HCC)    lt br cancer   Edema leg    with chemo   GERD (gastroesophageal reflux disease)    also, diverticulosis   History of kidney stones    Hyperlipidemia    Hypertension    Neuropathy    hands and feet post chemo   Wears glasses     Outpatient Medications Prior to Visit  Medication Sig   acetaminophen (TYLENOL) 500 MG tablet  Take 1,000 mg by mouth every 6 (six) hours as needed for mild pain.    cetirizine (ZYRTEC) 10 MG tablet Take 10 mg by mouth daily as needed for allergies.   diclofenac Sodium (VOLTAREN) 1 % GEL Apply 4 g topically 4 (four) times daily as needed.   docusate sodium (COLACE) 100 MG capsule Take 200 mg by mouth daily.   dorzolamide-timolol (COSOPT) 22.3-6.8 MG/ML ophthalmic solution Place 1 drop into both eyes 2 (two) times daily.   fluticasone (FLONASE) 50 MCG/ACT nasal spray SPRAY 2 SPRAYS INTO EACH NOSTRIL EVERY DAY   gabapentin (NEURONTIN)  100 MG capsule Take 1 capsule (100 mg total) by mouth 2 (two) times daily.   latanoprost (XALATAN) 0.005 % ophthalmic solution Place 1 drop into both eyes at bedtime.   methocarbamol (ROBAXIN) 500 MG tablet Take 1 tablet (500 mg total) by mouth 2 (two) times daily as needed for muscle spasms.   NIFEdipine (PROCARDIA-XL/NIFEDICAL-XL) 30 MG 24 hr tablet TAKE 1 TABLET BY MOUTH TWICE A DAY   Omega 3 1000 MG CAPS Take 1 capsule (1,000 mg total) by mouth daily.   omeprazole (PRILOSEC OTC) 20 MG tablet Take 20 mg by mouth daily as needed (acid reflux).   ondansetron (ZOFRAN) 4 MG tablet Take 1 tablet (4 mg total) by mouth every 8 (eight) hours as needed for nausea or vomiting.   oxyCODONE (ROXICODONE) 5 MG immediate release tablet Take 1 tablet (5 mg total) by mouth every 6 (six) hours as needed for up to 5 days for severe pain.   rosuvastatin (CRESTOR) 5 MG tablet Take 0.5 tablets (2.5 mg total) by mouth daily.   triamterene-hydrochlorothiazide (MAXZIDE-25) 37.5-25 MG tablet TAKE 1 TABLET BY MOUTH EVERY DAY   zolpidem (AMBIEN) 10 MG tablet Take 1 tablet (10 mg total) by mouth at bedtime as needed. for sleep   [DISCONTINUED] aspirin 81 MG tablet Take 81 mg by mouth daily.    [DISCONTINUED] Cholecalciferol (VITAMIN D-3) 125 MCG (5000 UT) TABS Take 5,000 mg by mouth daily.   [DISCONTINUED] diclofenac (VOLTAREN) 50 MG EC tablet TAKE 1 TABLET (50 MG TOTAL) BY MOUTH 2 (TWO) TIMES DAILY AS NEEDED FOR MODERATE PAIN.   [DISCONTINUED] fluconazole (DIFLUCAN) 150 MG tablet Take 1 tablet po once. May repeat dose in 3 days as needed for persistent symptoms.   [DISCONTINUED] neomycin-polymyxin b-dexamethasone (MAXITROL) 3.5-10000-0.1 SUSP Place 1 drop into the left eye 4 (four) times daily.   [DISCONTINUED] omega-3 acid ethyl esters (LOVAZA) 1 g capsule TAKE 1 CAPSULE (1,000 MG TOTAL) BY MOUTH DAILY.   [DISCONTINUED] rOPINIRole (REQUIP) 0.5 MG tablet TAKE 1-2 TABLETS (0.5-1 MG TOTAL) BY MOUTH 3 (THREE) TIMES DAILY.    [DISCONTINUED] tamsulosin (FLOMAX) 0.4 MG CAPS capsule Take 1 capsule (0.4 mg total) by mouth daily after supper. (Patient not taking: Reported on 01/21/2023)   No facility-administered medications prior to visit.    Past Surgical History:  Procedure Laterality Date   ABDOMINAL HYSTERECTOMY  1986   AXILLARY LYMPH NODE DISSECTION Left 06/26/2014   Procedure: AXILLARY LYMPH NODE DISSECTION;  Surgeon: Ovidio Kin, MD;  Location: [facility];  Service: General;  Laterality: Left;   BACK SURGERY  1990, 1991   lumbar   BREAST SURGERY Left 06/2014   CATARACT EXTRACTION W/PHACO Right 03/23/2019   Procedure: CATARACT EXTRACTION PHACO AND INTRAOCULAR LENS PLACEMENT (IOC) RIGHT VISION BLUE;  Surgeon: Elliot Cousin, MD;  Location: [facility];  Service: Ophthalmology;  Laterality: Right;  [place] 00:55.4 CDE 5.92 Fluid Pack lot #  [id number]   COLONOSCOPY     COLONOSCOPY WITH PROPOFOL N/A 12/29/2017   Procedure: COLONOSCOPY WITH PROPOFOL;  Surgeon: Toledo, Boykin Nearing, MD;  Location: [facility];  Service: Gastroenterology;  Laterality: N/A;   CYSTOSCOPY/URETEROSCOPY/HOLMIUM LASER/STENT PLACEMENT Bilateral 07/28/2019   Procedure: CYSTOSCOPY/URETEROSCOPY/HOLMIUM LASER/STENT PLACEMENT;  Surgeon: Sondra Come, MD;  Location: [facility];  Service: Urology;  Laterality: Bilateral;   DENTAL SURGERY Right    top    DILATION AND CURETTAGE OF UTERUS     LAPAROSCOPIC GASTRIC BANDING  10/19/05   MASTECTOMY Left 2016   MASTECTOMY MODIFIED RADICAL Left 06/26/2014   Procedure: MASTECTOMY MODIFIED RADICAL;  Surgeon: Ovidio Kin, MD;  Location: [facility];  Service: General;  Laterality: Left;   PORT A CATH REVISION     insertion-10/15   PORT-A-CATH REMOVAL N/A 02/07/2015   Procedure: MINOR REMOVAL PORT-A-CATH;  Surgeon: Ovidio Kin, MD;  Location: [facility];  Service: General;  Laterality: N/A;   Family Status  Relation Name Status   Mother  Deceased   Father  Deceased    Sister  Alive   Brother  Deceased   Mat Aunt  Deceased   Brother  Alive   Brother  Alive   Brother  Alive   Cousin  (Not Specified)   Cousin  (Not Specified)   Cousin  (Not Specified)  No partnership data on file   Family History  Problem Relation Age of Onset   Asthma Mother    Diabetes Mother    Diabetes Father    Heart disease Father    Cancer Maternal Aunt    Cancer Cousin    Cancer Cousin    Cancer Cousin    Social History   Socioeconomic History   Marital status: Married    Spouse name: Gerri Spore   Number of children: Not on file   Years of education: Not on file   Highest education level: Bachelor's degree (e.g., BA, AB, BS)  Occupational History   Not on file  Tobacco Use   Smoking status: Never   Smokeless tobacco: Never  Vaping Use   Vaping status: Never Used  Substance and Sexual Activity   Alcohol use: No   Drug use: No   Sexual activity: Yes  Other Topics Concern   Not on file  Social History Narrative   Not on file   Social Determinants of Health   Financial Resource Strain: Low Risk  (01/21/2023)   Overall Financial Resource Strain (CARDIA)    Difficulty of Paying Living Expenses: Not hard at all  Food Insecurity: No Food Insecurity (01/21/2023)   Hunger Vital Sign    Worried About Running Out of Food in the Last Year: Never true    Ran Out of Food in the Last Year: Never true  Transportation Needs: No Transportation Needs (01/21/2023)   PRAPARE - Administrator, Civil Service (Medical): No    Lack of Transportation (Non-Medical): No  Physical Activity: Unknown (01/21/2023)   Exercise Vital Sign    Days of Exercise per Week: 1 day    Minutes of Exercise per Session: Patient declined  Stress: No Stress Concern Present (01/21/2023)   [employer] of Occupational Health - Occupational Stress Questionnaire    Feeling of Stress : Not at all  Social Connections: Socially Integrated (01/21/2023)   Social Connection and Isolation Panel  [NHANES]    Frequency of Communication with Friends and Family: More than three times a week    Frequency  of Social Gatherings with Friends and Family: More than three times a week    Attends Religious Services: More than 4 times per year    Active Member of [employer] or Organizations: Yes    Attends Banker Meetings: Patient declined    Marital Status: Married     Allergies  Allergen Reactions   Darvocet [Propoxyphene N-Acetaminophen] Shortness Of Breath and Swelling   Latex Shortness Of Breath, Swelling and Anaphylaxis   Penicillins     Did it involve swelling of the face/tongue/throat, SOB, or low BP? Unknown Did it involve sudden or severe rash/hives, skin peeling, or any reaction on the inside of your mouth or nose? Unknown Did you need to seek medical attention at a hospital or doctor's office? Unknown When did it last happen?      Childhood allergy  If all above answers are "NO", may proceed with cephalosporin use.  Has tolerated amoxicillin     Cephalexin Itching   Enalapril Cough     Vasotec    Immunization History  Administered Date(s) Administered   Fluad Quad(high Dose 65+) 01/11/2019, 01/23/2019   Influenza, High Dose Seasonal PF 02/17/2017, 02/01/2018   Influenza-Unspecified 03/17/2021   Moderna Sars-Covid-2 Vaccination 06/16/2019, 07/14/2019, 04/06/2020   Pneumococcal Polysaccharide-23 06/16/2016   Zoster Recombinant(Shingrix) 01/25/2021, 07/05/2021    Health Maintenance  Topic Date Due   DTaP/Tdap/Td (1 - Tdap) Never done   Pneumonia Vaccine 10+ Years old (2 of 2 - PCV) 06/16/2017   INFLUENZA VACCINE  12/03/2022   COVID-19 Vaccine (4 - 2023-24 season) 01/03/2023   Diabetic kidney evaluation - Urine ACR  05/27/2027 (Originally 07/26/1968)   Diabetic kidney evaluation - eGFR measurement  05/20/2023   Medicare Annual Wellness (AWV)  05/27/2023   MAMMOGRAM  02/20/2024   Colonoscopy  12/30/2027   DEXA SCAN  Completed   Hepatitis C Screening   Completed   Zoster Vaccines- Shingrix  Completed   HPV VACCINES  Aged Out   FOOT EXAM  Discontinued   HEMOGLOBIN A1C  Discontinued   OPHTHALMOLOGY EXAM  Discontinued    Patient Care Team: Ronnald Ramp, MD as PCP - General (Family Medicine) Serena Croissant, MD as Consulting Physician (Hematology and Oncology) Lonie Peak, MD as Attending Physician (Radiation Oncology) Ovidio Kin, MD as Consulting Physician (General Surgery)  Review of Systems      Objective    BP 125/73 (BP Location: Right Arm, Patient Position: Sitting, Cuff Size: Large)   Pulse 80   Ht 5\' 3"  (1.6 m)   Wt 198 lb 14.4 oz (90.2 kg)   LMP 06/02/1984   SpO2 100%   BMI 35.23 kg/m      Depression Screen    09/24/2022    9:01 AM 05/26/2022    1:55 PM 12/12/2021   10:05 AM 06/19/2021    9:18 AM  PHQ 2/9 Scores  PHQ - 2 Score 0 0 0 2  PHQ- 9 Score 4 3 3 7    No results found for any visits on 01/21/23.   Physical Exam Vitals reviewed.  Constitutional:      General: She is not in acute distress.    Appearance: Normal appearance. She is not ill-appearing, toxic-appearing or diaphoretic.  HENT:     Head:   Eyes:     Conjunctiva/sclera: Conjunctivae normal.  Cardiovascular:     Rate and Rhythm: Normal rate and regular rhythm.     Pulses: Normal pulses.     Heart sounds: Normal heart sounds. No murmur heard.  No friction rub. No gallop.  Pulmonary:     Effort: Pulmonary effort is normal. No respiratory distress.     Breath sounds: Normal breath sounds. No stridor. No wheezing, rhonchi or rales.  Abdominal:     General: Bowel sounds are normal. There is no distension.     Palpations: Abdomen is soft.     Tenderness: There is no abdominal tenderness.  Musculoskeletal:     Right lower leg: No edema.     Left lower leg: No edema.  Skin:    Findings: No erythema or rash.  Neurological:     Mental Status: She is alert and oriented to person, place, and time.        Assessment  & Plan      Problem List Items Addressed This Visit     Chronic pain syndrome (Chronic)    History of two back surgeries and a current bulging disc on the left side. Robaxin 500mg  is taken as needed for muscle spasms. -Continue Robaxin 500mg  as needed for muscle spasms.      Encounter to establish care    Welcomed patient to [facility]  Reviewed patient's medical history, medications, surgical and social history Discussed roles and expectations for primary care physician-patient relationship Recommended patient schedule annual preventative examinations        Essential hypertension - Primary    Chronic Hypertension Controlled with medication. Current blood pressure is 125/73. -Continue Nifedipine 30mg  twice daily and Maxzide 37.5mg  once daily.      Neuropathy due to chemotherapeutic drug (HCC)    Chronic Neuropathy Right leg neuropathy secondary to cancer treatment. Gabapentin 100mg  is taken as needed for pain. -Continue Gabapentin 100mg  as needed for neuropathy pain.      Primary insomnia     Chronic Insomnia Managed with Ambien 10mg  as needed. -Continue Ambien 10mg  as needed for sleep.          Salivary Gland Nodule Pain in the face due to a small nodule in the salivary gland. The patient declined surgical intervention. The pain has improved with oxycodone. -Continue to monitor symptoms.   Glaucoma Managed with eye drops. -Continue Cosopt eye drops twice daily and once at night.   General Health Maintenance / Followup Plans -Administer flu vaccine today. -Scheduled mammogram for February 22, 2023. -Schedule annual wellness visit in January 2025.     Return in about 4 months (around 05/29/2023) for AWV.      Ronnald Ramp, MD  [facility] [phone] (phone) [phone] (fax)  [facility]

## 2023-01-21 NOTE — Patient Instructions (Signed)
VISIT SUMMARY:  During your visit, we discussed several health issues including facial pain due to a small nodule in your salivary gland, hypertension, insomnia, neuropathy in your right leg, glaucoma, chronic back pain, and high cholesterol. We also discussed your general health maintenance and follow-up plans.  YOUR PLAN:  -SALIVARY GLAND NODULE: You have a small lump in your salivary gland that is causing facial pain. You have chosen not to have surgery to remove it. We will continue to monitor your symptoms.  -HYPERTENSION: Your high blood pressure is well-controlled with medication. We will continue with your current medication regimen.  -INSOMNIA: You have trouble sleeping, which is managed with medication. We will continue with your current medication as needed.  -NEUROPATHY: You have nerve pain in your right leg, a side effect from a cancer treatment you received a decade ago. We will continue with your current medication as needed.  -GLAUCOMA: You have glaucoma, a condition that damages your eye's optic nerve. We will continue with your current eye drops.  -CHRONIC BACK PAIN: You have chronic back pain due to previous surgeries and a bulging disc. We will continue with your current medication as needed for muscle spasms.  -HYPERLIPIDEMIA: You have high cholesterol, which is managed with medication. We will continue with your current medication regimen.  INSTRUCTIONS:  We will administer your flu vaccine today. Please schedule your mammogram for February 22, 2023, and your annual wellness visit in January 2025.

## 2023-01-21 NOTE — Addendum Note (Signed)
Addended by: Rolly Salter on: 01/21/2023 03:18 PM   Modules accepted: Orders

## 2023-01-21 NOTE — Assessment & Plan Note (Signed)
  Chronic Insomnia Managed with Ambien 10mg  as needed. -Continue Ambien 10mg  as needed for sleep.

## 2023-01-21 NOTE — Assessment & Plan Note (Signed)
Managed with Crestor 2.5mg  and Omega-3 1000mg  daily. -Continue Crestor 2.5mg  and Omega-3 1000mg  daily.

## 2023-01-21 NOTE — Assessment & Plan Note (Signed)
Chronic Hypertension Controlled with medication. Current blood pressure is 125/73. -Continue Nifedipine 30mg  twice daily and Maxzide 37.5mg  once daily.

## 2023-01-22 ENCOUNTER — Other Ambulatory Visit: Payer: Self-pay | Admitting: Otolaryngology

## 2023-01-22 DIAGNOSIS — K118 Other diseases of salivary glands: Secondary | ICD-10-CM

## 2023-01-25 ENCOUNTER — Ambulatory Visit: Payer: PPO | Admitting: Nurse Practitioner

## 2023-01-26 ENCOUNTER — Other Ambulatory Visit: Payer: Self-pay | Admitting: Otolaryngology

## 2023-01-26 ENCOUNTER — Inpatient Hospital Stay
Admission: RE | Admit: 2023-01-26 | Discharge: 2023-01-26 | Disposition: A | Discharge status: Home / Self Care | Payer: Self-pay | Source: Ambulatory Visit | Attending: Otolaryngology | Admitting: Otolaryngology

## 2023-01-26 DIAGNOSIS — E041 Nontoxic single thyroid nodule: Secondary | ICD-10-CM

## 2023-01-26 DIAGNOSIS — K118 Other diseases of salivary glands: Secondary | ICD-10-CM

## 2023-01-27 ENCOUNTER — Ambulatory Visit
Admission: RE | Admit: 2023-01-27 | Discharge: 2023-01-27 | Disposition: A | Discharge status: Home / Self Care | Payer: PPO | Source: Ambulatory Visit | Attending: Otolaryngology | Admitting: Otolaryngology

## 2023-01-27 DIAGNOSIS — E041 Nontoxic single thyroid nodule: Secondary | ICD-10-CM

## 2023-01-27 DIAGNOSIS — E042 Nontoxic multinodular goiter: Secondary | ICD-10-CM | POA: Diagnosis not present

## 2023-01-28 ENCOUNTER — Other Ambulatory Visit: Payer: PPO

## 2023-02-01 ENCOUNTER — Ambulatory Visit: Payer: PPO

## 2023-02-15 ENCOUNTER — Encounter: Payer: Self-pay | Admitting: Family Medicine

## 2023-02-15 ENCOUNTER — Ambulatory Visit: Payer: PPO | Admitting: Family Medicine

## 2023-02-15 VITALS — BP 136/80 | HR 86 | Ht 63.0 in | Wt 196.0 lb

## 2023-02-15 DIAGNOSIS — G8929 Other chronic pain: Secondary | ICD-10-CM | POA: Diagnosis not present

## 2023-02-15 DIAGNOSIS — M25552 Pain in left hip: Secondary | ICD-10-CM

## 2023-02-15 MED ORDER — KETOROLAC TROMETHAMINE 30 MG/ML IJ SOLN
30.0000 mg | Freq: Once | INTRAMUSCULAR | Status: DC
Start: 2023-02-15 — End: 2023-02-15

## 2023-02-15 MED ORDER — PREDNISONE 20 MG PO TABS
ORAL_TABLET | ORAL | 0 refills | Status: AC
Start: 2023-02-15 — End: 2023-02-23

## 2023-02-15 MED ORDER — KETOROLAC TROMETHAMINE 30 MG/ML IJ SOLN
30.0000 mg | Freq: Once | INTRAMUSCULAR | 0 refills | Status: DC
Start: 2023-02-15 — End: 2023-02-15

## 2023-02-15 MED ORDER — KETOROLAC TROMETHAMINE 60 MG/2ML IM SOLN
60.0000 mg | Freq: Once | INTRAMUSCULAR | Status: AC
Start: 2023-02-15 — End: 2023-02-15
  Administered 2023-02-15: 30 mg via INTRAMUSCULAR

## 2023-02-15 NOTE — Progress Notes (Signed)
Established patient visit   Patient: Regina Deleon   DOB: Aug 19, 1950   72 y.o. Female  MRN: [id number] Visit Date: 02/15/2023  Today's healthcare provider: Ronnald Ramp, MD   Chief Complaint  Patient presents with   Back Pain    Patient would like to get a steroid shot for her back pain.   Subjective     HPI     Back Pain    Additional comments: Patient would like to get a steroid shot for her back pain.      Last edited by Adline Peals, CMA on 02/15/2023  1:35 PM.       Discussed the use of AI scribe software for clinical note transcription with the patient, who gave verbal consent to proceed.  History of Present Illness   The patient, with a history of two back surgeries due to a ruptured disc and a spur, presents with hip pain. The pain is localized to the left hip and is exacerbated by standing for prolonged periods. The patient reports that the pain is severe enough to prevent her from standing for long durations, such as during church services. She has a history of bursitis since the age of 58 and has been diagnosed with degenerative arthritis.  In the past, the patient has received cortisone injections in the hip, which provided relief. She also reports a positive response to Toradol and a prednisone taper for an acute flare in June. The patient has a history of cancer, but recent imaging showed a benign solid nodule.  The patient's primary concern is the hip pain, which is impacting her daily activities and quality of life. She is seeking relief from this pain and is open to treatments that have been effective in the past.         Past Medical History:  Diagnosis Date   Cancer (HCC)    lt br cancer   Edema leg    with chemo   GERD (gastroesophageal reflux disease)    also, diverticulosis   History of kidney stones    Hyperlipidemia    Hypertension    Neuropathy    hands and feet post chemo   Wears glasses      Medications: Outpatient Medications Prior to Visit  Medication Sig   acetaminophen (TYLENOL) 500 MG tablet Take 1,000 mg by mouth every 6 (six) hours as needed for mild pain.    cetirizine (ZYRTEC) 10 MG tablet Take 10 mg by mouth daily as needed for allergies.   diclofenac Sodium (VOLTAREN) 1 % GEL Apply 4 g topically 4 (four) times daily as needed.   docusate sodium (COLACE) 100 MG capsule Take 200 mg by mouth daily.   dorzolamide-timolol (COSOPT) 22.3-6.8 MG/ML ophthalmic solution Place 1 drop into both eyes 2 (two) times daily.   fluticasone (FLONASE) 50 MCG/ACT nasal spray SPRAY 2 SPRAYS INTO EACH NOSTRIL EVERY DAY   gabapentin (NEURONTIN) 100 MG capsule Take 1 capsule (100 mg total) by mouth 2 (two) times daily.   latanoprost (XALATAN) 0.005 % ophthalmic solution Place 1 drop into both eyes at bedtime.   methocarbamol (ROBAXIN) 500 MG tablet Take 1 tablet (500 mg total) by mouth 2 (two) times daily as needed for muscle spasms.   NIFEdipine (PROCARDIA-XL/NIFEDICAL-XL) 30 MG 24 hr tablet TAKE 1 TABLET BY MOUTH TWICE A DAY   Omega 3 1000 MG CAPS Take 1 capsule (1,000 mg total) by mouth daily.   omeprazole (PRILOSEC OTC) 20 MG tablet  Take 20 mg by mouth daily as needed (acid reflux).   ondansetron (ZOFRAN) 4 MG tablet Take 1 tablet (4 mg total) by mouth every 8 (eight) hours as needed for nausea or vomiting.   rosuvastatin (CRESTOR) 5 MG tablet Take 0.5 tablets (2.5 mg total) by mouth daily.   triamterene-hydrochlorothiazide (MAXZIDE-25) 37.5-25 MG tablet TAKE 1 TABLET BY MOUTH EVERY DAY   zolpidem (AMBIEN) 10 MG tablet Take 1 tablet (10 mg total) by mouth at bedtime as needed. for sleep   No facility-administered medications prior to visit.    Review of Systems      Objective    BP 136/80 (BP Location: Left Arm, Patient Position: Sitting, Cuff Size: Large)   Pulse 86   Ht 5\' 3"  (1.6 m)   Wt 196 lb (88.9 kg)   LMP 06/02/1984   SpO2 96%   BMI 34.72 kg/m   BP Readings from  Last 3 Encounters:  02/15/23 136/80  01/21/23 125/73  01/19/23 (!) 164/81   Wt Readings from Last 3 Encounters:  02/15/23 196 lb (88.9 kg)  01/21/23 198 lb 14.4 oz (90.2 kg)  01/19/23 200 lb 6.4 oz (90.9 kg)       Physical Exam  Left Hip: Hip:  - Inspection: No gross deformity, no swelling, erythema, or ecchymosis - Palpation:  TTP mildly TTP over left greater trochanter, more tenderness in posterior hip  - ROM: Normal range of motion on Flexion, extension, abduction, internal and external rotation - Strength: Normal strength. - Neuro/vasc: NV intact distally    No results found for any visits on 02/15/23.   Assessment & Plan     Problem List Items Addressed This Visit     Chronic left hip pain - Primary    Hip Pain Likely bursitis given the location of the pain and previous diagnosis. Pain is exacerbated by standing for prolonged periods. Previous treatments including cortisone injections and oral steroids have been beneficial. Acute on chronic pain  -Administer Toradol 30mg  injection today. -Prescribe prednisone taper:40mg  for 2 days, 20mg  for 3 days, 10mg  for 3 days. Pt to return if symptoms not improved after `1 week      Relevant Medications   predniSONE (DELTASONE) 20 MG tablet             No follow-ups on file.         Ronnald Ramp, MD  [facility] [phone] (phone) [phone] (fax)  [facility]

## 2023-02-15 NOTE — Assessment & Plan Note (Signed)
Hip Pain Likely bursitis given the location of the pain and previous diagnosis. Pain is exacerbated by standing for prolonged periods. Previous treatments including cortisone injections and oral steroids have been beneficial. Acute on chronic pain  -Administer Toradol 30mg  injection today. -Prescribe prednisone taper:40mg  for 2 days, 20mg  for 3 days, 10mg  for 3 days. Pt to return if symptoms not improved after `1 week

## 2023-02-15 NOTE — Patient Instructions (Signed)
VISIT SUMMARY:  During your visit, we discussed your severe hip pain that is affecting your daily activities and quality of life. We believe this pain is likely due to bursitis, an inflammation of the small fluid-filled sacs that cushion your hip joint. We also reviewed your history of back surgeries and cancer, but there are no current concerns related to these issues.  YOUR PLAN:  -HIP PAIN: To help manage your hip pain, we administered a Toradol injection during your visit. This medication helps to reduce inflammation and pain. We also prescribed a prednisone taper, which is a type of steroid that can help reduce inflammation. You will take 40mg  for 2 days, then 20mg  for 3 days, and finally 10mg  for 3 days.   INSTRUCTIONS:  Please follow the prescribed prednisone taper schedule: take 40mg  for the first 2 days, then 20mg  for the next 3 days, and finally 10mg  for the last 3 days. If your hip pain does not improve or if it worsens, please contact our office immediately to schedule follow up

## 2023-02-18 ENCOUNTER — Ambulatory Visit: Payer: PPO | Admitting: Family Medicine

## 2023-02-19 ENCOUNTER — Other Ambulatory Visit: Payer: Self-pay | Admitting: Nurse Practitioner

## 2023-02-19 DIAGNOSIS — I10 Essential (primary) hypertension: Secondary | ICD-10-CM

## 2023-02-19 NOTE — Telephone Encounter (Signed)
Good morning. It looks like this patient is now being cared for by you. I am unable to fill this as I am no longer her PCP.

## 2023-02-22 ENCOUNTER — Ambulatory Visit
Admission: RE | Admit: 2023-02-22 | Discharge: 2023-02-22 | Disposition: A | Discharge status: Home / Self Care | Payer: PPO | Source: Ambulatory Visit | Attending: Hematology and Oncology | Admitting: Hematology and Oncology

## 2023-02-22 ENCOUNTER — Other Ambulatory Visit: Payer: Self-pay | Admitting: Hematology and Oncology

## 2023-02-22 DIAGNOSIS — Z1231 Encounter for screening mammogram for malignant neoplasm of breast: Secondary | ICD-10-CM

## 2023-02-23 ENCOUNTER — Other Ambulatory Visit: Payer: Self-pay | Admitting: Family Medicine

## 2023-02-23 NOTE — Telephone Encounter (Signed)
Medication Refill - Medication: zolpidem (AMBIEN) 10 MG tablet   Has the patient contacted their pharmacy? Yes.     Preferred Pharmacy (with phone number or street name):  [id number] - [place], [place] - [address] Phone: [phone]  Fax: [phone]      Has the patient been seen for an appointment in the last year OR does the patient have an upcoming appointment? Yes.    The patient states she just ran out of her medication yesterday. Please assist patient further

## 2023-02-24 ENCOUNTER — Other Ambulatory Visit: Payer: Self-pay | Admitting: Hematology and Oncology

## 2023-02-24 ENCOUNTER — Other Ambulatory Visit: Payer: Self-pay | Admitting: Nurse Practitioner

## 2023-02-24 DIAGNOSIS — R928 Other abnormal and inconclusive findings on diagnostic imaging of breast: Secondary | ICD-10-CM

## 2023-02-24 DIAGNOSIS — F5101 Primary insomnia: Secondary | ICD-10-CM

## 2023-02-24 NOTE — Telephone Encounter (Signed)
Requested medication (s) are due for refill today: yes  Requested medication (s) are on the active medication list: yes  Last refill:  12/31/19  Future visit scheduled: no  Notes to clinic:  Unable to refill per protocol, cannot delegate.      Requested Prescriptions  Pending Prescriptions Disp Refills   ondansetron (ZOFRAN) 4 MG tablet 8 tablet 0    Sig: Take 1 tablet (4 mg total) by mouth every 8 (eight) hours as needed for nausea or vomiting.     Not Delegated - Gastroenterology: Antiemetics - ondansetron Failed - 02/23/2023  9:41 AM      Failed - This refill cannot be delegated      Passed - AST in normal range and within 360 days    AST  Date Value Ref Range Status  05/19/2022 15 0 - 40 IU/L Final  01/31/2015 23 5 - 34 U/L Final         Passed - ALT in normal range and within 360 days    ALT  Date Value Ref Range Status  05/19/2022 11 0 - 32 IU/L Final  01/31/2015 23 0 - 55 U/L Final         Passed - Valid encounter within last 6 months    Recent Outpatient Visits           1 week ago Chronic left hip pain   [facility], Tawanna Cooler, MD   1 month ago Encounter to establish care   [facility] [address], Tawanna Cooler, MD

## 2023-02-24 NOTE — Telephone Encounter (Signed)
Good morning. This patient has established care with you. I am unable to fill this for her. Please fill if you feel it is appropriate. Thank you.  Vincent Gros, NP

## 2023-03-03 ENCOUNTER — Ambulatory Visit: Payer: PPO

## 2023-03-03 ENCOUNTER — Other Ambulatory Visit: Payer: Self-pay | Admitting: Family Medicine

## 2023-03-03 ENCOUNTER — Ambulatory Visit
Admission: RE | Admit: 2023-03-03 | Discharge: 2023-03-03 | Disposition: A | Discharge status: Home / Self Care | Payer: PPO | Source: Ambulatory Visit | Attending: Hematology and Oncology | Admitting: Hematology and Oncology

## 2023-03-03 DIAGNOSIS — R928 Other abnormal and inconclusive findings on diagnostic imaging of breast: Secondary | ICD-10-CM | POA: Diagnosis not present

## 2023-03-12 ENCOUNTER — Other Ambulatory Visit: Payer: PPO

## 2023-04-05 ENCOUNTER — Other Ambulatory Visit: Payer: Self-pay | Admitting: Family Medicine

## 2023-04-05 DIAGNOSIS — I10 Essential (primary) hypertension: Secondary | ICD-10-CM

## 2023-04-05 DIAGNOSIS — F5101 Primary insomnia: Secondary | ICD-10-CM

## 2023-04-05 MED ORDER — NIFEDIPINE ER OSMOTIC RELEASE 30 MG PO TB24
30.0000 mg | ORAL_TABLET | Freq: Two times a day (BID) | ORAL | 3 refills | Status: DC
Start: 2023-04-05 — End: 2024-02-24

## 2023-04-05 NOTE — Telephone Encounter (Signed)
Medication Refill -  Most Recent Primary Care Visit:  Provider: Ronnald Ramp  Department: [facility]  Visit Type: OFFICE VISIT  Date: 02/15/2023  Medication: zolpidem (AMBIEN) 10 MG   Has the patient contacted their pharmacy? Yes (Agent: If yes, when and what did the pharmacy advise?) Pt contacted pharmacy but decided to reach out to office as well.   Is this the correct pharmacy for this prescription? Yes This is the patient's preferred pharmacy:  [id number] - [place], [place] - [address] [place] [address] Phone: [phone] Fax: [phone]  Has the prescription been filled recently? No  Is the patient out of the medication? Yes  Has the patient been seen for an appointment in the last year OR does the patient have an upcoming appointment? Yes  Can we respond through MyChart? Yes  Agent: Please be advised that Rx refills may take up to 3 business days. We ask that you follow-up with your pharmacy.

## 2023-04-05 NOTE — Telephone Encounter (Signed)
Already requested by the pharmacy in a separate refill encounter on 04/05/23.

## 2023-04-05 NOTE — Telephone Encounter (Signed)
Good morning. This patient used to see me for primary care. I cannot fill for her due to job change. I think she is seeing you now. Will you fill this for her, please? Thank you.  Herbert Seta, NP

## 2023-05-07 ENCOUNTER — Telehealth: Payer: Self-pay | Admitting: Family Medicine

## 2023-05-07 NOTE — Telephone Encounter (Signed)
 Patient refill request for the following medications:   zolpidem (AMBIEN) 10 MG table    Please advise

## 2023-05-10 ENCOUNTER — Ambulatory Visit (INDEPENDENT_AMBULATORY_CARE_PROVIDER_SITE_OTHER): Payer: PPO | Admitting: Family Medicine

## 2023-05-10 ENCOUNTER — Other Ambulatory Visit: Payer: Self-pay

## 2023-05-10 DIAGNOSIS — Z538 Procedure and treatment not carried out for other reasons: Secondary | ICD-10-CM

## 2023-05-10 DIAGNOSIS — F5101 Primary insomnia: Secondary | ICD-10-CM

## 2023-05-10 MED ORDER — ZOLPIDEM TARTRATE 10 MG PO TABS: 10.0000 mg | ORAL_TABLET | Freq: Every evening | ORAL | 0 refills | Status: DC | PRN

## 2023-05-11 NOTE — Progress Notes (Signed)
 Appt was cancelled.

## 2023-05-20 DIAGNOSIS — C50912 Malignant neoplasm of unspecified site of left female breast: Secondary | ICD-10-CM | POA: Diagnosis not present

## 2023-05-26 ENCOUNTER — Encounter: Payer: PPO | Admitting: Family Medicine

## 2023-06-09 DIAGNOSIS — M3501 Sicca syndrome with keratoconjunctivitis: Secondary | ICD-10-CM | POA: Diagnosis not present

## 2023-06-09 DIAGNOSIS — H16002 Unspecified corneal ulcer, left eye: Secondary | ICD-10-CM | POA: Diagnosis not present

## 2023-06-09 DIAGNOSIS — H401112 Primary open-angle glaucoma, right eye, moderate stage: Secondary | ICD-10-CM | POA: Diagnosis not present

## 2023-06-09 DIAGNOSIS — H40033 Anatomical narrow angle, bilateral: Secondary | ICD-10-CM | POA: Diagnosis not present

## 2023-06-10 ENCOUNTER — Encounter: Payer: Self-pay | Admitting: Family Medicine

## 2023-06-10 ENCOUNTER — Ambulatory Visit: Payer: PPO | Admitting: Family Medicine

## 2023-06-10 VITALS — BP 136/73 | HR 80 | Ht 63.0 in | Wt 198.0 lb

## 2023-06-10 DIAGNOSIS — Z0001 Encounter for general adult medical examination with abnormal findings: Secondary | ICD-10-CM

## 2023-06-10 DIAGNOSIS — E042 Nontoxic multinodular goiter: Secondary | ICD-10-CM

## 2023-06-10 DIAGNOSIS — M545 Low back pain, unspecified: Secondary | ICD-10-CM | POA: Diagnosis not present

## 2023-06-10 DIAGNOSIS — M25552 Pain in left hip: Secondary | ICD-10-CM

## 2023-06-10 DIAGNOSIS — G894 Chronic pain syndrome: Secondary | ICD-10-CM

## 2023-06-10 DIAGNOSIS — I10 Essential (primary) hypertension: Secondary | ICD-10-CM

## 2023-06-10 DIAGNOSIS — M5431 Sciatica, right side: Secondary | ICD-10-CM | POA: Diagnosis not present

## 2023-06-10 DIAGNOSIS — F5101 Primary insomnia: Secondary | ICD-10-CM | POA: Diagnosis not present

## 2023-06-10 DIAGNOSIS — G8929 Other chronic pain: Secondary | ICD-10-CM

## 2023-06-10 DIAGNOSIS — Z0000 Encounter for general adult medical examination without abnormal findings: Secondary | ICD-10-CM

## 2023-06-10 MED ORDER — KETOROLAC TROMETHAMINE 60 MG/2ML IM SOLN
30.0000 mg | Freq: Once | INTRAMUSCULAR | Status: AC
Start: 2023-06-10 — End: 2023-06-10
  Administered 2023-06-10: 30 mg via INTRAMUSCULAR

## 2023-06-10 MED ORDER — ZOLPIDEM TARTRATE 10 MG PO TABS: 10.0000 mg | ORAL_TABLET | Freq: Every evening | ORAL | 0 refills | Status: DC | PRN

## 2023-06-10 MED ORDER — PREDNISONE 20 MG PO TABS
ORAL_TABLET | ORAL | 0 refills | Status: AC
Start: 2023-06-10 — End: 2023-06-17

## 2023-06-10 NOTE — Progress Notes (Signed)
 Subjective:   Regina Deleon is a 73 y.o. female who presents for Medicare Annual (Subsequent) preventive examination.  Visit Complete: In person  Patient Medicare AWV questionnaire was completed by the patient on 06/10/23; I have confirmed that all information answered by patient is correct and no changes since this date.        Objective:    Today's Vitals   06/10/23 1101 06/10/23 1104  BP: 136/73 136/73  Pulse:  80  Weight: 198 lb (89.8 kg) 198 lb (89.8 kg)  Height: 5' 3 (1.6 m) 5' 3 (1.6 m)  PainSc: 8  8   PainLoc: Back    Body mass index is 35.07 kg/m.     04/07/2022    8:50 AM 07/28/2019    8:50 AM 12/29/2017    1:54 PM 02/05/2016   11:19 AM 01/31/2015    8:50 AM 11/07/2014    8:07 AM 09/26/2014    8:48 AM  Advanced Directives  Does Patient Have a Medical Advance Directive? Yes No No Yes Yes Yes Yes  Type of Estate Agent of [place];Living will    Healthcare Power of [employer] Power of [employer] Power of Attorney  Does patient want to make changes to medical advance directive? Yes (Inpatient - patient defers changing a medical advance directive at this time - Information given)        Copy of Healthcare Power of Attorney in Chart?   No - copy requested      Would patient like information on creating a medical advance directive?  No - Patient declined No - Patient declined        Current Medications (verified) Outpatient Encounter Medications as of 06/10/2023  Medication Sig   acetaminophen  (TYLENOL ) 500 MG tablet Take 1,000 mg by mouth every 6 (six) hours as needed for mild pain.    cetirizine (ZYRTEC) 10 MG tablet Take 10 mg by mouth daily as needed for allergies.   diclofenac  Sodium (VOLTAREN ) 1 % GEL Apply 4 g topically 4 (four) times daily as needed.   docusate sodium (COLACE) 100 MG capsule Take 200 mg by mouth daily.   dorzolamide-timolol  (COSOPT) 22.3-6.8 MG/ML ophthalmic solution Place 1 drop into both eyes 2 (two)  times daily.   fluticasone  (FLONASE ) 50 MCG/ACT nasal spray SPRAY 2 SPRAYS INTO EACH NOSTRIL EVERY DAY   gabapentin  (NEURONTIN ) 100 MG capsule Take 1 capsule (100 mg total) by mouth 2 (two) times daily.   latanoprost (XALATAN) 0.005 % ophthalmic solution Place 1 drop into both eyes at bedtime.   methocarbamol  (ROBAXIN ) 500 MG tablet Take 1 tablet (500 mg total) by mouth 2 (two) times daily as needed for muscle spasms.   NIFEdipine  (PROCARDIA -XL/NIFEDICAL-XL) 30 MG 24 hr tablet Take 1 tablet (30 mg total) by mouth 2 (two) times daily.   Omega 3 1000 MG CAPS Take 1 capsule (1,000 mg total) by mouth daily.   omeprazole (PRILOSEC OTC) 20 MG tablet Take 20 mg by mouth daily as needed (acid reflux).   ondansetron  (ZOFRAN ) 4 MG tablet Take 1 tablet (4 mg total) by mouth every 8 (eight) hours as needed for nausea or vomiting.   predniSONE  (DELTASONE ) 20 MG tablet Take 2 tablets (40 mg total) by mouth daily with breakfast for 3 days, THEN 1 tablet (20 mg total) daily with breakfast for 4 days.   rosuvastatin  (CRESTOR ) 5 MG tablet Take 0.5 tablets (2.5 mg total) by mouth daily.   triamterene -hydrochlorothiazide (MAXZIDE-25) 37.5-25 MG tablet TAKE  1 TABLET BY MOUTH EVERY DAY   [DISCONTINUED] zolpidem  (AMBIEN ) 10 MG tablet Take 1 tablet (10 mg total) by mouth at bedtime as needed. for sleep   zolpidem  (AMBIEN ) 10 MG tablet Take 1 tablet (10 mg total) by mouth at bedtime as needed. for sleep   [EXPIRED] ketorolac  (TORADOL ) injection 30 mg    No facility-administered encounter medications on file as of 06/10/2023.    Allergies (verified) Darvocet [propoxyphene n-acetaminophen ], Latex, Penicillins, Cephalexin, and Enalapril   History: Past Medical History:  Diagnosis Date   Cancer (HCC)    lt br cancer   Edema leg    with chemo   GERD (gastroesophageal reflux disease)    also, diverticulosis   History of kidney stones    Hyperlipidemia    Hypertension    Neuropathy    hands and feet post chemo    Wears glasses    Past Surgical History:  Procedure Laterality Date   ABDOMINAL HYSTERECTOMY  1986   AXILLARY LYMPH NODE DISSECTION Left 06/26/2014   Procedure: AXILLARY LYMPH NODE DISSECTION;  Surgeon: Alm Angle, MD;  Location: [facility];  Service: General;  Laterality: Left;   BACK SURGERY  1990, 1991   lumbar   BREAST SURGERY Left 06/2014   CATARACT EXTRACTION W/PHACO Right 03/23/2019   Procedure: CATARACT EXTRACTION PHACO AND INTRAOCULAR LENS PLACEMENT (IOC) RIGHT VISION BLUE;  Surgeon: Ferol Rogue, MD;  Location: [facility];  Service: Ophthalmology;  Laterality: Right;  US  00:55.4 CDE 5.92 Fluid Pack lot # [id number]   COLONOSCOPY     COLONOSCOPY WITH PROPOFOL  N/A 12/29/2017   Procedure: COLONOSCOPY WITH PROPOFOL ;  Surgeon: Toledo, Ladell POUR, MD;  Location: [facility];  Service: Gastroenterology;  Laterality: N/A;   CYSTOSCOPY/URETEROSCOPY/HOLMIUM LASER/STENT PLACEMENT Bilateral 07/28/2019   Procedure: CYSTOSCOPY/URETEROSCOPY/HOLMIUM LASER/STENT PLACEMENT;  Surgeon: Francisca Rogue BROCKS, MD;  Location: [facility];  Service: Urology;  Laterality: Bilateral;   DENTAL SURGERY Right    top    DILATION AND CURETTAGE OF UTERUS     LAPAROSCOPIC GASTRIC BANDING  10/19/05   MASTECTOMY Left 2016   MASTECTOMY MODIFIED RADICAL Left 06/26/2014   Procedure: MASTECTOMY MODIFIED RADICAL;  Surgeon: Alm Angle, MD;  Location: [facility];  Service: General;  Laterality: Left;   PORT A CATH REVISION     insertion-10/15   PORT-A-CATH REMOVAL N/A 02/07/2015   Procedure: MINOR REMOVAL PORT-A-CATH;  Surgeon: Alm Angle, MD;  Location: [facility];  Service: General;  Laterality: N/A;   Family History  Problem Relation Age of Onset   Asthma Mother    Diabetes Mother    Diabetes Father    Heart disease Father    Cancer Maternal Aunt    Cancer Cousin    Cancer Cousin    Cancer Cousin    Social History   Socioeconomic History   Marital status: Married     Spouse name: Darryle   Number of children: Not on file   Years of education: Not on file   Highest education level: Bachelor's degree (e.g., BA, AB, BS)  Occupational History   Not on file  Tobacco Use   Smoking status: Never   Smokeless tobacco: Never  Vaping Use   Vaping status: Never Used  Substance and Sexual Activity   Alcohol use: No   Drug use: No   Sexual activity: Yes  Other Topics Concern   Not on file  Social History Narrative   Not on file   Social Drivers of Health  Financial Resource Strain: Low Risk  (06/10/2023)   Overall Financial Resource Strain (CARDIA)    Difficulty of Paying Living Expenses: Not hard at all  Food Insecurity: No Food Insecurity (06/10/2023)   Hunger Vital Sign    Worried About Running Out of Food in the Last Year: Never true    Ran Out of Food in the Last Year: Never true  Transportation Needs: No Transportation Needs (06/10/2023)   PRAPARE - Administrator, Civil Service (Medical): No    Lack of Transportation (Non-Medical): No  Physical Activity: Inactive (06/10/2023)   Exercise Vital Sign    Days of Exercise per Week: 0 days    Minutes of Exercise per Session: 0 min  Stress: No Stress Concern Present (06/10/2023)   [employer] of Occupational Health - Occupational Stress Questionnaire    Feeling of Stress : Not at all  Social Connections: Socially Integrated (06/10/2023)   Social Connection and Isolation Panel [NHANES]    Frequency of Communication with Friends and Family: More than three times a week    Frequency of Social Gatherings with Friends and Family: More than three times a week    Attends Religious Services: More than 4 times per year    Active Member of Clubs or Organizations: Yes    Attends Engineer, Structural: More than 4 times per year    Marital Status: Married    Tobacco Counseling Counseling given: Not Answered   Clinical Intake:  Pre-visit preparation completed: Yes  Pain :  0-10 Pain Score: 8  Pain Type: Chronic pain Pain Location: Back Pain Orientation: Lower Pain Onset: More than a month ago Pain Frequency: Constant     Nutritional Status: BMI > 30  Obese Nutritional Risks: None Diabetes: No  How often do you need to have someone help you when you read instructions, pamphlets, or other written materials from your doctor or pharmacy?: 1 - Never  Interpreter Needed?: No      Activities of Daily Living     No data to display          Patient Care Team: Sharma Coyer, MD as PCP - General (Family Medicine) Odean Potts, MD as Consulting Physician (Hematology and Oncology) Izell Domino, MD as Attending Physician (Radiation Oncology) Ethyl Lenis, MD as Consulting Physician (General Surgery)  Indicate any recent Medical Services you may have received from other than [facility] providers in the past year (date may be approximate).  Physical Exam   CHEST: Lungs clear to auscultation, no wheezing or crackles. CARDIOVASCULAR: Heart rhythm regular, no murmurs. ABDOMEN: Bowel sounds normal. MUSCULOSKELETAL: Lower extremities show no swelling or edema. Discomfort with range of motion testing in the hips.          Assessment:   This is a routine wellness examination for [place].  Hearing/Vision screen No results found.   Goals Addressed   None   Depression Screen    06/10/2023   11:08 AM 02/15/2023    1:40 PM 09/24/2022    9:01 AM 05/26/2022    1:55 PM 12/12/2021   10:05 AM 06/19/2021    9:18 AM 04/09/2021    9:12 AM  PHQ 2/9 Scores  PHQ - 2 Score 3 1 0 0 0 2 1  PHQ- 9 Score 15 3 4 3 3 7 2     Fall Risk    06/10/2023   11:08 AM 05/26/2022    1:56 PM 06/19/2021    9:19 AM 04/09/2021    9:13  AM 11/05/2020    9:16 AM  Fall Risk   Falls in the past year? 0 0 0 0 0  Number falls in past yr:  0 0 0 0  Injury with Fall?  0 0 0 0  Risk for fall due to :     No Fall Risks  Follow up  Falls evaluation completed Falls evaluation  completed Falls evaluation completed Falls evaluation completed    MEDICARE RISK AT HOME:    TIMED UP AND GO:  Was the test performed?  No    Cognitive Function:    12/26/2019    9:32 AM 11/29/2018   11:43 AM 10/14/2017    3:29 PM  MMSE - Mini Mental State Exam  Orientation to time 5 5 5   Orientation to Place 5 5 5   Registration 3 3 3   Attention/ Calculation 5 5 5   Recall 3 3 3   Language- name 2 objects 2 2 2   Language- repeat 1 1 1   Language- follow 3 step command 3 3 3   Language- read & follow direction 1 1 1   Write a sentence 1 1 1   Copy design 1 1 1   Total score 30 30 30         05/26/2022    2:06 PM 04/09/2021    9:14 AM  6CIT Screen  What Year? 0 points 0 points  What month? 0 points 0 points  What time? 0 points 0 points  Count back from 20 0 points 0 points  Months in reverse 0 points 0 points  Repeat phrase 0 points 0 points  Total Score 0 points 0 points    Immunizations Immunization History  Administered Date(s) Administered   Fluad Quad(high Dose 65+) 01/11/2019, 01/23/2019   Fluad Trivalent(High Dose 65+) 01/21/2023   Influenza, High Dose Seasonal PF 02/17/2017, 02/01/2018   Influenza-Unspecified 03/17/2021   Moderna Sars-Covid-2 Vaccination 06/16/2019, 07/14/2019, 04/06/2020   Pneumococcal Polysaccharide-23 06/16/2016   Zoster Recombinant(Shingrix) 01/25/2021, 07/05/2021    TDAP status: Patient wishes to receive this vaccine today after disclosing that insurance does not cover the vaccine as a preventative vaccine.   Flu Vaccine status: Up to date  Pneumococcal vaccine status: Due, Education has been provided regarding the importance of this vaccine. Advised may receive this vaccine at local pharmacy or Health Dept. Aware to provide a copy of the vaccination record if obtained from local pharmacy or Health Dept. Verbalized acceptance and understanding.  Covid-19 vaccine status: Completed vaccines  Qualifies for Shingles Vaccine? Yes   Zostavax  completed No   Shingrix Completed?: No.    Education has been provided regarding the importance of this vaccine. Patient has been advised to call insurance company to determine out of pocket expense if they have not yet received this vaccine. Advised may also receive vaccine at local pharmacy or Health Dept. Verbalized acceptance and understanding.  Screening Tests Health Maintenance  Topic Date Due   DTaP/Tdap/Td (1 - Tdap) Never done   Pneumonia Vaccine 44+ Years old (2 of 2 - PCV) 06/16/2017   COVID-19 Vaccine (4 - 2024-25 season) 01/03/2023   Diabetic kidney evaluation - eGFR measurement  05/20/2023   Diabetic kidney evaluation - Urine ACR  05/27/2027 (Originally 07/26/1968)   Medicare Annual Wellness (AWV)  06/09/2024   MAMMOGRAM  02/21/2025   Colonoscopy  12/30/2027   INFLUENZA VACCINE  Completed   DEXA SCAN  Completed   Hepatitis C Screening  Completed   Zoster Vaccines- Shingrix  Completed   HPV VACCINES  Aged Out   FOOT EXAM  Discontinued   HEMOGLOBIN A1C  Discontinued   OPHTHALMOLOGY EXAM  Discontinued    Health Maintenance  Health Maintenance Due  Topic Date Due   DTaP/Tdap/Td (1 - Tdap) Never done   Pneumonia Vaccine 42+ Years old (2 of 2 - PCV) 06/16/2017   COVID-19 Vaccine (4 - 2024-25 season) 01/03/2023   Diabetic kidney evaluation - eGFR measurement  05/20/2023    Colorectal cancer screening: Type of screening: Colonoscopy. Completed 2019. Repeat every 10 years  Mammogram status: Completed 02/2023. Repeat every year  Bone Density status: Completed 2017. Results reflect: Bone density results: OSTEOPENIA. Repeat every 2 years. Pt declines repeat    Additional Screening:   Vision Screening: Recommended annual ophthalmology exams for early detection of glaucoma and other disorders of the eye. Is the patient up to date with their annual eye exam?  Yes  Who is the provider or what is the name of the office in which the patient attends annual eye exams? [facility]  If pt is not established with a provider, would they like to be referred to a provider to establish care? No .   Dental Screening: Recommended annual dental exams for proper oral hygiene      Plan:   Encounter for annual wellness visit (AWV) in Medicare patient I have personally reviewed and noted the following in the patient's chart:   Medical and social history Use of alcohol, tobacco or illicit drugs  Current medications and supplements including opioid prescriptions. Patient is not currently taking opioid prescriptions. Functional ability and status Nutritional status Physical activity Advanced directives List of other physicians Hospitalizations, surgeries, and ER visits in previous 12 months Vitals Screenings to include cognitive, depression, and falls Referrals and appointments  In addition, I have reviewed and discussed with patient certain preventive protocols, quality metrics, and best practice recommendations. A written personalized care plan for preventive services as well as general preventive health recommendations were provided to patient.      Chronic Back Pain with Sciatica Chronic back pain with sciatica secondary to a bulging disc on the left side. Significant pain relief previously achieved with Toradol  injections and a short course of prednisone . Limited long-term benefit from physical therapy and steroid injections. Prefers to avoid narcotics due to constipation and is not considering surgical options due to age and potential complications. Discussed risks and benefits of Toradol  and prednisone , including short-term relief and need for tapering steroids. - Administer 30 mg Toradol  injection - Prescribe prednisone  40 mg for 3 days, then 20 mg for 4 days - Discuss potential referral to a neurosurgeon if pain persists  Insomnia Chronic insomnia managed with Ambien  10 mg. Discussed risks of long-term use, including dependency and decreased  metabolism, especially in older adults. Guidelines recommend limiting Ambien  to 5 mg in older adults. - Continue Ambien  10 mg as prescribed - Monitor for adverse effects or need for dosage adjustment  Severe Dry Eye Disease Severe dry eye disease in the left eye, managed with ophthalmologist-prescribed eye drops. Reports improvement with current treatment. - Continue current eye drop regimen - Follow up with ophthalmologist in 3 months  General Health Maintenance Annual wellness visit. Up to date on flu, shingles, and COVID-19 vaccines. Recent mammogram normal, colonoscopy in 2019 with no issues. Discussed pneumonia and tetanus vaccines, including benefits and need for tetanus every ten years. Prefers to read more about the pneumonia vaccine before deciding. - Provide information on pneumonia and tetanus vaccines -  Discuss importance of receiving pneumonia vaccine - Schedule follow-up for lab work and urine albumin test  Follow-up - Schedule follow-up visit in June for chronic check-in and lab work.           Rockie Agent, MD   06/10/2023

## 2023-06-10 NOTE — Assessment & Plan Note (Signed)
 I have personally reviewed and noted the following in the patient's chart:   Medical and social history Use of alcohol, tobacco or illicit drugs  Current medications and supplements including opioid prescriptions. Patient is not currently taking opioid prescriptions. Functional ability and status Nutritional status Physical activity Advanced directives List of other physicians Hospitalizations, surgeries, and ER visits in previous 12 months Vitals Screenings to include cognitive, depression, and falls Referrals and appointments  In addition, I have reviewed and discussed with patient certain preventive protocols, quality metrics, and best practice recommendations. A written personalized care plan for preventive services as well as general preventive health recommendations were provided to patient.

## 2023-06-10 NOTE — Addendum Note (Signed)
 Addended by: SIMMONS-ROBINSON, Odis Wickey L on: 06/10/2023 01:22 PM   Modules accepted: Level of Service

## 2023-06-10 NOTE — Patient Instructions (Signed)
 Recommended the Tdap vaccine at the pharmacy as well as the pneumococcal vaccine

## 2023-07-06 ENCOUNTER — Ambulatory Visit: Payer: Self-pay | Admitting: Family Medicine

## 2023-07-06 DIAGNOSIS — M5417 Radiculopathy, lumbosacral region: Secondary | ICD-10-CM

## 2023-07-06 MED ORDER — GABAPENTIN 100 MG PO CAPS: 100.0000 mg | ORAL_CAPSULE | Freq: Two times a day (BID) | ORAL | 1 refills | Status: DC

## 2023-07-06 NOTE — Telephone Encounter (Signed)
  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 Patient states pain has increased over the last couple of days. Denies fever, CP or SOB. Per protocol, patient is recommended to be seen in three days. Appointment made for 07/08/2023 at 8:20 am. Patient verbalized understanding of plan and all questions answered. Patient is requesting refill for Gabapentin which was prescribed last year by her previous provider.     Copied from CRM [id number]. Topic: Clinical - Red Word Triage >> Jul 06, 2023 10:00 AM Albin Felling L wrote: Red Word that prompted transfer to Nurse Triage: More pain than normal in left leg. Patient believes its due to neuropathy & bulging disc. Reason for Disposition  [1] MODERATE pain (e.g., interferes with normal activities, limping) AND [2] present > 3 days  [1] Prescription refill request for NON-ESSENTIAL medicine (i.e., no harm to patient if med not taken) AND [2] triager unable to refill per department policy  Answer Assessment - Initial Assessment Questions 1. ONSET: "When did the pain start?"      Chronic pain to left leg 2. LOCATION: "Where is the pain located?"      Left leg 3. PAIN: "How bad is the pain?"    (Scale 1-10; or mild, moderate, severe)   -  MILD (1-3): doesn't interfere with normal activities    -  MODERATE (4-7): interferes with normal activities (e.g., work or school) or awakens from sleep, limping    -  SEVERE (8-10): excruciating pain, unable to do any normal activities, unable to walk     6 out of 10 4.  WORK OR EXERCISE: "Has there been any recent work or exercise that involved this part of the body?"      No 5. CAUSE: "What do you think is causing the leg pain?"     Neuropathy pain  6. OTHER SYMPTOMS: "Do you have any other symptoms?" (e.g., chest pain, back pain, breathing difficulty, swelling, rash, fever, numbness, weakness)     Numbness and weakness  Answer Assessment - Initial Assessment Questions 1. DRUG NAME: "What medicine do you need to have refilled?"     Gabapentin,  2. REFILLS REMAINING: "How many refills are remaining?" (Note: The label on the medicine or pill bottle will show how many refills are remaining. If there are no refills remaining, then a renewal may be needed.)     0 3. EXPIRATION DATE: "What is the expiration date?" (Note: The label states when the prescription will expire, and thus can no longer be refilled.)     05/26/2023 4. PRESCRIBING HCP: "Who prescribed it?" Reason: If prescribed by specialist, call should be referred to that group.     Vincent Gros NP-previous provider 5. SYMPTOMS: "Do you have any symptoms?"     Left leg pain  Protocols used: Leg Pain-A-AH, Medication Refill and Renewal Call-A-AH

## 2023-07-06 NOTE — Addendum Note (Signed)
 Addended by: Bing Neighbors on: 07/06/2023 03:37 PM   Modules accepted: Orders

## 2023-07-08 ENCOUNTER — Ambulatory Visit: Admitting: Family Medicine

## 2023-07-08 ENCOUNTER — Encounter: Payer: Self-pay | Admitting: Family Medicine

## 2023-07-08 VITALS — BP 111/57 | HR 81 | Ht 63.0 in | Wt 196.0 lb

## 2023-07-08 DIAGNOSIS — G894 Chronic pain syndrome: Secondary | ICD-10-CM | POA: Diagnosis not present

## 2023-07-08 DIAGNOSIS — F5101 Primary insomnia: Secondary | ICD-10-CM | POA: Diagnosis not present

## 2023-07-08 DIAGNOSIS — T451X5A Adverse effect of antineoplastic and immunosuppressive drugs, initial encounter: Secondary | ICD-10-CM

## 2023-07-08 DIAGNOSIS — G620 Drug-induced polyneuropathy: Secondary | ICD-10-CM | POA: Diagnosis not present

## 2023-07-08 MED ORDER — ZOLPIDEM TARTRATE 10 MG PO TABS: 10.0000 mg | ORAL_TABLET | Freq: Every evening | ORAL | 0 refills | Status: DC | PRN

## 2023-07-08 MED ORDER — KETOROLAC TROMETHAMINE 60 MG/2ML IM SOLN
60.0000 mg | Freq: Once | INTRAMUSCULAR | Status: AC
Start: 2023-07-08 — End: 2023-07-08
  Administered 2023-07-08: 30 mg via INTRAMUSCULAR

## 2023-07-08 MED ORDER — KETOROLAC TROMETHAMINE 30 MG/ML IJ SOLN
30.0000 mg | Freq: Once | INTRAMUSCULAR | Status: DC
Start: 2023-07-08 — End: 2023-07-08

## 2023-07-08 MED ORDER — PREDNISONE 20 MG PO TABS
ORAL_TABLET | ORAL | 0 refills | Status: AC
Start: 2023-07-08 — End: 2023-07-14

## 2023-07-08 NOTE — Patient Instructions (Signed)
 VISIT SUMMARY:  Today, we discussed your ongoing left leg pain, which has been troubling you for the past six days. This pain is related to your neuropathy from previous chemotherapy and is affecting your sleep and appetite. We reviewed your treatment history and current medications, and we have made some adjustments to help manage your symptoms better.  YOUR PLAN:  -LEFT LEG PAIN SECONDARY TO NEUROPATHY: Neuropathy is a condition where nerves are damaged, often causing pain, numbness, or weakness. Your left leg pain is due to this condition, likely worsened by your history of a ruptured disc. We will administer a Toradol injection today and prescribe prednisone to help reduce inflammation and pain. If your current dose of gabapentin is not enough, you can increase it to 200 mg at bedtime for 2-3 nights and then increase to 200mg  twice daily as needed .   If the pain continues, we may refer you back to the pain clinic for further management.  -INSOMNIA: Insomnia is difficulty falling or staying asleep. Your sleep issues are likely worsened by your chronic pain. You should continue taking Ambien 10 mg as needed to help you sleep.  INSTRUCTIONS:  Please follow up if your symptoms persist or worsen. If the pain continues despite the current treatment plan, we may consider referring you back to the pain clinic for further management.  For more information, you can read your full clinical note, available in your patient portal.

## 2023-07-08 NOTE — Assessment & Plan Note (Signed)
 Chronic, improved on ambien  Difficulty sleeping, likely exacerbated by chronic pain. Previously prescribed Ambien 10 mg. - Continue Ambien 10 mg as needed for sleep, refills provided today

## 2023-07-08 NOTE — Progress Notes (Signed)
 Established patient visit   Patient: Regina Deleon   DOB: 1950-06-05   73 y.o. Female  MRN: [id number] Visit Date: 07/08/2023  Today's healthcare provider: Ronnald Ramp, MD   Chief Complaint  Patient presents with  . Leg Pain    L leg pain this has been going on since Friday, she has been treat for this and would like to have those medication again.   Subjective     HPI     Leg Pain    Additional comments: L leg pain this has been going on since Friday, she has been treat for this and would like to have those medication again.      Last edited by Thedora Hinders, CMA on 07/08/2023  8:18 AM.       Discussed the use of AI scribe software for clinical note transcription with the patient, who gave verbal consent to proceed.  History of Present Illness   Regina Deleon is a 73 year old female with neuropathy due to chemotherapy who presents with left leg pain.  She has been experiencing left leg pain for the past six days. The pain is localized to the left side, where she previously had a ruptured disc, and extends down to her left foot. The pain is severe enough to disrupt her sleep and decrease her appetite.  Her neuropathy began approximately two years ago following cancer treatment. The neuropathy affects both legs but is currently more severe on the left side. She has undergone physical therapy and received injections, which provided temporary relief for about two months. She was also referred to a pain clinic for further management.  Previous treatments with Toradol and prednisone were effective in managing her pain. She has been using gabapentin 100 mg twice daily, which was recently refilled. She also uses Ambien 10 mg for sleep.  During the review of symptoms, she reports some numbness in her foot and describes it as feeling cold. No recent falls or injuries to her leg. Her strength remains good despite the pain.         Past Medical History:   Diagnosis Date  . Cancer (HCC)    lt br cancer  . Edema leg    with chemo  . GERD (gastroesophageal reflux disease)    also, diverticulosis  . History of kidney stones   . Hyperlipidemia   . Hypertension   . Neuropathy    hands and feet post chemo  . Wears glasses     Medications: Outpatient Medications Prior to Visit  Medication Sig  . acetaminophen (TYLENOL) 500 MG tablet Take 1,000 mg by mouth every 6 (six) hours as needed for mild pain.   . cetirizine (ZYRTEC) 10 MG tablet Take 10 mg by mouth daily as needed for allergies.  Marland Kitchen diclofenac Sodium (VOLTAREN) 1 % GEL Apply 4 g topically 4 (four) times daily as needed.  . docusate sodium (COLACE) 100 MG capsule Take 200 mg by mouth daily.  . dorzolamide-timolol (COSOPT) 22.3-6.8 MG/ML ophthalmic solution Place 1 drop into both eyes 2 (two) times daily.  . fluticasone (FLONASE) 50 MCG/ACT nasal spray SPRAY 2 SPRAYS INTO EACH NOSTRIL EVERY DAY  . gabapentin (NEURONTIN) 100 MG capsule Take 1 capsule (100 mg total) by mouth 2 (two) times daily.  Marland Kitchen latanoprost (XALATAN) 0.005 % ophthalmic solution Place 1 drop into both eyes at bedtime.  . methocarbamol (ROBAXIN) 500 MG tablet Take 1 tablet (500 mg total) by mouth 2 (two)  times daily as needed for muscle spasms.  Marland Kitchen NIFEdipine (PROCARDIA-XL/NIFEDICAL-XL) 30 MG 24 hr tablet Take 1 tablet (30 mg total) by mouth 2 (two) times daily.  . Omega 3 1000 MG CAPS Take 1 capsule (1,000 mg total) by mouth daily.  Marland Kitchen omeprazole (PRILOSEC OTC) 20 MG tablet Take 20 mg by mouth daily as needed (acid reflux).  . ondansetron (ZOFRAN) 4 MG tablet Take 1 tablet (4 mg total) by mouth every 8 (eight) hours as needed for nausea or vomiting.  . rosuvastatin (CRESTOR) 5 MG tablet Take 0.5 tablets (2.5 mg total) by mouth daily.  Marland Kitchen triamterene-hydrochlorothiazide (MAXZIDE-25) 37.5-25 MG tablet TAKE 1 TABLET BY MOUTH EVERY DAY  . [DISCONTINUED] zolpidem (AMBIEN) 10 MG tablet Take 1 tablet (10 mg total) by mouth at  bedtime as needed. for sleep   No facility-administered medications prior to visit.    Review of Systems  Last CBC Lab Results  Component Value Date   WBC 6.8 05/19/2022   HGB 10.2 (L) 05/19/2022   HCT 32.4 (L) 05/19/2022   MCV 80 05/19/2022   MCH 25.2 (L) 05/19/2022   RDW 14.5 05/19/2022   PLT 117 (L) 05/19/2022   Last metabolic panel Lab Results  Component Value Date   GLUCOSE 112 (H) 05/19/2022   NA 140 05/19/2022   K 4.3 05/19/2022   CL 102 05/19/2022   CO2 24 05/19/2022   BUN 14 05/19/2022   CREATININE 0.89 05/19/2022   EGFR 69 05/19/2022   CALCIUM 10.5 (H) 05/19/2022   PROT 7.1 05/19/2022   ALBUMIN 4.3 05/19/2022   LABGLOB 2.8 05/19/2022   AGRATIO 1.5 05/19/2022   BILITOT 0.3 05/19/2022   ALKPHOS 83 05/19/2022   AST 15 05/19/2022   ALT 11 05/19/2022   ANIONGAP 11 07/28/2019   Last hemoglobin A1c Lab Results  Component Value Date   HGBA1C 5.8 (H) 05/19/2022        Objective    BP (!) 111/57   Pulse 81   Ht 5\' 3"  (1.6 m)   Wt 196 lb (88.9 kg)   LMP 06/02/1984   SpO2 100%   BMI 34.72 kg/m      Physical Exam  Physical Exam   MSK - Inspection: no gross deformity or asymmetry, swelling or ecchymosis - Palpation:tender to palpation of left lower extremity from left buttock along posterior leg - ROM: full active ROM of LLE  - Strength: 5/5 strength of lower extremity - Neuro: sensation intact      No results found for any visits on 07/08/23.  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 Neuropathy due to chemotherapy for cancer treatment two years ago. Pain localized to left leg, extending to foot, with numbness and cold sensation. History of ruptured disc. Previously found relief with Toradol and prednisone. Underwent physical therapy and pain clinic  interventions, including injections providing temporary relief for about two months. Discussed risks and benefits of Toradol and prednisone, including potential side effects and the need to avoid frequent steroid use. Prefers non-surgical options and is hesitant about surgery due to observed outcomes in peers. - Administer Toradol 30 mg injection in clinic today  - Prescribe prednisone 40 mg for 3 days, followed by 20 mg for 3 days - Increase gabapentin to 200 mg  at bedtime if 100 mg is insufficient, can take up 200mg  BID pRN  - Consider another referral to pain clinic if symptoms persist, pt declined pain management in the past, reports that she has had spinal injections in the past but only had short term relief (reports less than 2 months)       Relevant Medications   predniSONE (DELTASONE) 20 MG tablet     Other   Primary insomnia   Chronic, improved on ambien  Difficulty sleeping, likely exacerbated by chronic pain. Previously prescribed Ambien 10 mg. - Continue Ambien 10 mg as needed for sleep, refills provided today          Relevant Medications   zolpidem (AMBIEN) 10 MG tablet   Chronic pain syndrome (Chronic)   Relevant Medications   predniSONE (DELTASONE) 20 MG tablet        Return in about 3 months (around 10/08/2023) for CHRONIC F/U.         Ronnald Ramp, MD  [facility] [phone] (phone) [phone] (fax)  [facility]

## 2023-07-08 NOTE — Assessment & Plan Note (Signed)
 Acute on chronic Left leg pain secondary to neuropathy, ongoing for six days. Neuropathy due to chemotherapy for cancer treatment two years ago. Pain localized to left leg, extending to foot, with numbness and cold sensation. History of ruptured disc. Previously found relief with Toradol and prednisone. Underwent physical therapy and pain clinic interventions, including injections providing temporary relief for about two months. Discussed risks and benefits of Toradol and prednisone, including potential side effects and the need to avoid frequent steroid use. Prefers non-surgical options and is hesitant about surgery due to observed outcomes in peers. - Administer Toradol 30 mg injection in clinic today  - Prescribe prednisone 40 mg for 3 days, followed by 20 mg for 3 days - Increase gabapentin to 200 mg at bedtime if 100 mg is insufficient, can take up 200mg  BID pRN  - Consider another referral to pain clinic if symptoms persist, pt declined pain management in the past, reports that she has had spinal injections in the past but only had short term relief (reports less than 2 months)

## 2023-08-06 ENCOUNTER — Ambulatory Visit: Payer: Self-pay

## 2023-08-06 ENCOUNTER — Other Ambulatory Visit: Payer: Self-pay | Admitting: Family Medicine

## 2023-08-06 DIAGNOSIS — F5101 Primary insomnia: Secondary | ICD-10-CM

## 2023-08-06 NOTE — Telephone Encounter (Signed)
 Chief Complaint: insomnia Frequency: years Pertinent Negatives: Patient denies CP, difficulty breathing Disposition: [] ED /[] Urgent Care (no appt availability in office) / [] Appointment(In office/virtual)/ []  [place] [facility]/ [] Home Care/ [] Refused Recommended Disposition /[] [place] [facility]/ [x]  Follow-up with PCP Additional Notes: Pt states she needs more Ambien. Pt states this is ongoing problem, does have chronic back pain-getting tx.   Copied from CRM [id number]. Topic: Clinical - Red Word Triage >> Aug 06, 2023  8:28 AM Regina Deleon wrote: Red Word that prompted transfer to Nurse Triage: The patient was calling for a refill of her zolpidem (AMBIEN) 10 MG tablet. However, she advised me that she has not been sleeping well and it is causing her a lot of anxiety. Reason for Disposition  Insomnia is an ongoing problem (> 2 weeks)  Answer Assessment - Initial Assessment Questions 1. DESCRIPTION: "Tell me about your sleeping problem."      Difficulty sleeping 2. ONSET: "How long have you been having trouble sleeping?" (e.g., days, weeks, months)     "For awhile now, even with my medicine, I've had insomnia for years" 3. RECURRENT: "Have you had sleeping problems before?"  If Yes, ask: "What happened that time?" "What helped your sleeping problem go away in the past?"      Ambien helps but does not alleviate 4. STRESS: "Is there anything in your life that is making you feel stressed or tense?"     denies 5. PAIN: "Do you have any pain that is keeping you awake?" (e.g., back pain, headache, abdomen pain)     Chronic back pain-getting treatment/med 6. CAFFEINE ABUSE: "Do you drink caffeinated beverages, and how much each day?" (e.g., coffee, tea, colas)     denies 7. ALCOHOL USE OR SUBSTANCE USE (DRUG USE): "Do you drink alcohol or use any illegal drugs?"     denies 8. OTHER SYMPTOMS: "Do you have any other symptoms?"  (e.g., difficulty breathing)     denies  Protocols used:  Insomnia-A-AH

## 2023-08-06 NOTE — Telephone Encounter (Signed)
 Requested medication (s) are due for refill today: Yes  Requested medication (s) are on the active medication list: Yes  Last refill:  07/08/23 #30, 0 refills   Future visit scheduled: Yes  Notes to clinic:  Unable to refill per protocol, cannot delegate.      Requested Prescriptions  Pending Prescriptions Disp Refills   zolpidem (AMBIEN) 10 MG tablet [Pharmacy Med Name: ZOLPIDEM TARTRATE 10 MG TABLET] 30 tablet 0    Sig: Take 1 tablet (10 mg total) by mouth at bedtime as needed. for sleep     Not Delegated - Psychiatry:  Anxiolytics/Hypnotics Failed - 08/06/2023  4:44 PM      Failed - This refill cannot be delegated      Failed - Urine Drug Screen completed in last 360 days      Passed - Valid encounter within last 6 months    Recent Outpatient Visits           4 weeks ago Neuropathy due to chemotherapeutic drug (HCC)   [facility] Simmons-Robinson, [place], MD   1 month ago Encounter for annual wellness visit (AWV) in Medicare patient   [facility] Simmons-Robinson, Tawanna Cooler, MD       Future Appointments             In 2 months Simmons-Robinson, Tawanna Cooler, MD [facility]

## 2023-08-09 ENCOUNTER — Other Ambulatory Visit: Payer: Self-pay | Admitting: Family Medicine

## 2023-08-09 DIAGNOSIS — F5101 Primary insomnia: Secondary | ICD-10-CM

## 2023-08-09 MED ORDER — ZOLPIDEM TARTRATE 10 MG PO TABS
10.0000 mg | ORAL_TABLET | Freq: Every evening | ORAL | 0 refills | Status: DC | PRN
Start: 2023-08-09 — End: 2023-09-20

## 2023-08-09 NOTE — Telephone Encounter (Signed)
 Refill sent to pharmacy 08/09/23

## 2023-09-02 ENCOUNTER — Ambulatory Visit: Payer: Self-pay

## 2023-09-02 DIAGNOSIS — R220 Localized swelling, mass and lump, head: Secondary | ICD-10-CM | POA: Diagnosis not present

## 2023-09-02 NOTE — Telephone Encounter (Signed)
 If no availability with providers in office, recommend UC evaluation today.

## 2023-09-02 NOTE — Telephone Encounter (Signed)
 Copied from CRM [id number]. Topic: Clinical - Red Word Triage >> Sep 02, 2023  9:04 AM Hassie Lint wrote: [facility] that prompted transfer to Nurse Triage: Pain, Patient is experiencing pain on the left side of her face, from her Ear and goes underneath her chin. Has been causing her issues sleeping. States has had the pain for awhile and has spoken to the Dr previously about the pain.   Chief Complaint: facial pain Symptoms: facial pain sensitive to the touch, some swelling to area, interrupted sleep Frequency: constant Pertinent Negatives: Patient denies numbness/tingling, SOB, chest pain, redness, rash, fever, toothache, nasal discharge, nasal congestion, clicking sensation in jaw joint Disposition: [] 911 / [] ED /[x] Urgent Care (no appt availability in office) / [] Appointment(In office/virtual)/ []  [place] [facility]/ [] Home Care/ [] Refused Recommended Disposition /[] [place] [facility]/ []  Follow-up with PCP Additional Notes: Pt reporting that she has had return of facial pain to left side of face from left ear to underneath chin, pain is constant 7-8/10, confirms "a little swelling" to the area, but denies redness, rash, fever, numbness/tingling, other symptoms. Pt confirms that she has had this pain before, "been told it was a salivary gland," last felt pain "almost a year ago," one time had clindamycin for this and that helped. Pt confirms no numbness/tingling to face or other parts of body or other symptoms, "just pain" and some swelling. Advised pt be examined in next 4 hours for symptoms, no PCP availability today, advised UC in meantime, sending HP message for call back to pt if able to fit into schedule today, advised head to UC if not heard back in next couple hours, per pt preference. Pt verbalized understanding.  Reason for Disposition  [1] Swollen area of face AND [2] is painful to touch  Answer Assessment - Initial Assessment Questions 1. ONSET: "When did the pain start?" (e.g.,  minutes, hours, days)     Last couple days 2. ONSET: "Does the pain come and go, or has it been constant since it started?" (e.g., constant, intermittent, fleeting)     constant 3. SEVERITY: "How bad is the pain?"   (Scale 1-10; mild, moderate or severe)   - MILD (1-3): doesn't interfere with normal activities    - MODERATE (4-7): interferes with normal activities or awakens from sleep    - SEVERE (8-10): excruciating pain, unable to do any normal activities      7-8/10 4. LOCATION: "Where does it hurt?"      Left ear to underneath her chin 5. RASH: "Is there any redness, rash, or swelling of the face?"     A little swelling 6. FEVER: "Do you have a fever?" If Yes, ask: "What is it, how was it measured, and when did it start?"      no 7. OTHER SYMPTOMS: "Do you have any other symptoms?" (e.g., fever, toothache, nasal discharge, nasal congestion, clicking sensation in jaw joint)     no  Protocols used: Face Pain-A-AH

## 2023-09-03 NOTE — Telephone Encounter (Signed)
 Can someone please reach out to offer this pt an appt with any available provider, if not please advise to go to UC per Dr Ardeth Beckers

## 2023-09-03 NOTE — Telephone Encounter (Signed)
 This pt was already seen at urgent care yesterday.

## 2023-09-08 DIAGNOSIS — H401112 Primary open-angle glaucoma, right eye, moderate stage: Secondary | ICD-10-CM | POA: Diagnosis not present

## 2023-09-13 DIAGNOSIS — H401112 Primary open-angle glaucoma, right eye, moderate stage: Secondary | ICD-10-CM | POA: Diagnosis not present

## 2023-09-13 DIAGNOSIS — H2640 Unspecified secondary cataract: Secondary | ICD-10-CM | POA: Diagnosis not present

## 2023-09-13 DIAGNOSIS — M3501 Sicca syndrome with keratoconjunctivitis: Secondary | ICD-10-CM | POA: Diagnosis not present

## 2023-09-13 DIAGNOSIS — H2512 Age-related nuclear cataract, left eye: Secondary | ICD-10-CM | POA: Diagnosis not present

## 2023-09-20 ENCOUNTER — Encounter: Payer: Self-pay | Admitting: Ophthalmology

## 2023-09-20 ENCOUNTER — Other Ambulatory Visit: Payer: Self-pay | Admitting: Family Medicine

## 2023-09-20 ENCOUNTER — Other Ambulatory Visit: Payer: Self-pay

## 2023-09-20 DIAGNOSIS — F5101 Primary insomnia: Secondary | ICD-10-CM

## 2023-09-20 NOTE — Telephone Encounter (Signed)
 Copied from CRM [id number]. Topic: Clinical - Medication Refill >> Sep 20, 2023  8:37 AM Earnestine Goes B wrote: Medication: zolpidem  (AMBIEN ) 10 MG tablet   Has the patient contacted their pharmacy? Yes (Agent: If no, request that the patient contact the pharmacy for the refill. If patient does not wish to contact the pharmacy document the reason why and proceed with request.) (Agent: If yes, when and what did the pharmacy advise?)  This is the patient's preferred pharmacy:  [id number] - [place], [place] - [address] [place] [address] Phone: [phone] Fax: [phone]  Is this the correct pharmacy for this prescription? Yes If no, delete pharmacy and type the correct one.   Has the prescription been filled recently? Yes  Is the patient out of the medication? Yes  Has the patient been seen for an appointment in the last year OR does the patient have an upcoming appointment? Yes  Can we respond through MyChart? Yes  Agent: Please be advised that Rx refills may take up to 3 business days. We ask that you follow-up with your pharmacy.

## 2023-09-21 NOTE — Anesthesia Preprocedure Evaluation (Addendum)
 Anesthesia Evaluation  Patient identified by MRN, date of birth, ID band Patient awake    Reviewed: Allergy & Precautions, H&P , NPO status , Patient's Chart, lab work & pertinent test results  Airway Mallampati: III  TM Distance: >3 FB Neck ROM: Full    Dental no notable dental hx.    Pulmonary neg pulmonary ROS   Pulmonary exam normal breath sounds clear to auscultation       Cardiovascular hypertension, Normal cardiovascular exam Rhythm:Regular Rate:Normal  03-28-15 echo EF 60-65%   Neuro/Psych  Neuromuscular disease negative neurological ROS  negative psych ROS   GI/Hepatic negative GI ROS, Neg liver ROS,GERD  ,,  Endo/Other  negative endocrine ROS    Renal/GU negative Renal ROS  negative genitourinary   Musculoskeletal negative musculoskeletal ROS (+) Arthritis ,    Abdominal   Peds negative pediatric ROS (+)  Hematology negative hematology ROS (+)   Anesthesia Other Findings Hypertension  Hyperlipidemia Wears glasses  Edema leg Neuropathy  History of kidney stones Cancer GERD (gastroesophageal reflux disease Patient extremely anxious, on verge of panic; her friend "died from anesthesia" so she is very afraid. Discussed anesthesia today and attempted to allay fears. Will also administer versed  preop, oxygen, pulse oximetry.    Reproductive/Obstetrics negative OB ROS                             Anesthesia Physical Anesthesia Plan  ASA: 3  Anesthesia Plan: MAC   Post-op Pain Management:    Induction: Intravenous  PONV Risk Score and Plan:   Airway Management Planned: Natural Airway and Nasal Cannula  Additional Equipment:   Intra-op Plan:   Post-operative Plan:   Informed Consent: I have reviewed the patients History and Physical, chart, labs and discussed the procedure including the risks, benefits and alternatives for the proposed anesthesia with the patient or  authorized representative who has indicated his/her understanding and acceptance.     Dental Advisory Given  Plan Discussed with: Anesthesiologist, CRNA and Surgeon  Anesthesia Plan Comments: (Patient consented for risks of anesthesia including but not limited to:  - adverse reactions to medications - damage to eyes, teeth, lips or other oral mucosa - nerve damage due to positioning  - sore throat or hoarseness - Damage to heart, brain, nerves, lungs, other parts of body or loss of life  Patient voiced understanding and assent.)       Anesthesia Quick Evaluation

## 2023-09-21 NOTE — Telephone Encounter (Signed)
 Requested medication (s) are due for refill today - no  Requested medication (s) are on the active medication list -yes  Future visit scheduled -yes  Last refill: 09/20/23 #30  Notes to clinic: duplicate request, non delegated Rx  Requested Prescriptions  Pending Prescriptions Disp Refills   zolpidem  (AMBIEN ) 10 MG tablet 30 tablet 0    Sig: Take 1 tablet (10 mg total) by mouth at bedtime as needed. for sleep     Not Delegated - Psychiatry:  Anxiolytics/Hypnotics Failed - 09/21/2023  1:49 PM      Failed - This refill cannot be delegated      Failed - Urine Drug Screen completed in last 360 days      Passed - Valid encounter within last 6 months    Recent Outpatient Visits           2 months ago Neuropathy due to chemotherapeutic drug (HCC)   [place] [facility] Simmons-Robinson, [place], MD   3 months ago Encounter for annual wellness visit (AWV) in Medicare patient   [place] [facility] [place], [place], MD       Future Appointments             In 3 weeks Simmons-Robinson, [place], MD [facility], [facility]               Requested Prescriptions  Pending Prescriptions Disp Refills   zolpidem  (AMBIEN ) 10 MG tablet 30 tablet 0    Sig: Take 1 tablet (10 mg total) by mouth at bedtime as needed. for sleep     Not Delegated - Psychiatry:  Anxiolytics/Hypnotics Failed - 09/21/2023  1:49 PM      Failed - This refill cannot be delegated      Failed - Urine Drug Screen completed in last 360 days      Passed - Valid encounter within last 6 months    Recent Outpatient Visits           2 months ago Neuropathy due to chemotherapeutic drug (HCC)   [place] [facility] Simmons-Robinson, [place], MD   3 months ago Encounter for annual wellness visit (AWV) in Medicare patient   [place] [facility] Simmons-Robinson, Judyann Number, MD       Future Appointments              In 3 weeks Simmons-Robinson, Judyann Number, MD [facility], [facility]

## 2023-09-29 DIAGNOSIS — H2513 Age-related nuclear cataract, bilateral: Secondary | ICD-10-CM | POA: Diagnosis not present

## 2023-09-29 DIAGNOSIS — H2512 Age-related nuclear cataract, left eye: Secondary | ICD-10-CM | POA: Diagnosis not present

## 2023-10-01 NOTE — Discharge Instructions (Signed)

## 2023-10-05 ENCOUNTER — Encounter
Admission: RE | Disposition: A | Discharge status: Home / Self Care | Payer: Self-pay | Source: Home / Self Care | Attending: Ophthalmology

## 2023-10-05 ENCOUNTER — Encounter: Payer: Self-pay | Admitting: Ophthalmology

## 2023-10-05 ENCOUNTER — Ambulatory Visit: Payer: Self-pay | Admitting: Anesthesiology

## 2023-10-05 ENCOUNTER — Ambulatory Visit
Admission: RE | Admit: 2023-10-05 | Discharge: 2023-10-05 | Disposition: A | Discharge status: Home / Self Care | Attending: Ophthalmology | Admitting: Ophthalmology

## 2023-10-05 ENCOUNTER — Other Ambulatory Visit: Payer: Self-pay

## 2023-10-05 DIAGNOSIS — H2512 Age-related nuclear cataract, left eye: Secondary | ICD-10-CM | POA: Diagnosis not present

## 2023-10-05 DIAGNOSIS — I10 Essential (primary) hypertension: Secondary | ICD-10-CM | POA: Insufficient documentation

## 2023-10-05 DIAGNOSIS — K219 Gastro-esophageal reflux disease without esophagitis: Secondary | ICD-10-CM | POA: Diagnosis not present

## 2023-10-05 HISTORY — PX: CATARACT EXTRACTION W/PHACO: SHX586

## 2023-10-05 SURGERY — PHACOEMULSIFICATION, CATARACT, WITH IOL INSERTION
Anesthesia: Monitor Anesthesia Care | Site: Eye | Laterality: Left

## 2023-10-05 MED ORDER — MIDAZOLAM HCL 2 MG/2ML IJ SOLN
INTRAMUSCULAR | Status: AC
  Filled 2023-10-05: qty 2

## 2023-10-05 MED ORDER — ARMC OPHTHALMIC DILATING DROPS
OPHTHALMIC | Status: AC
  Filled 2023-10-05: qty 0.5

## 2023-10-05 MED ORDER — ARMC OPHTHALMIC DILATING DROPS
1.0000 | OPHTHALMIC | Status: DC | PRN
Start: 2023-10-05 — End: 2023-10-05
  Administered 2023-10-05 (×3): 1 via OPHTHALMIC

## 2023-10-05 MED ORDER — SIGHTPATH DOSE#1 BSS IO SOLN
INTRAOCULAR | Status: DC | PRN
  Administered 2023-10-05: 68 mL via OPHTHALMIC

## 2023-10-05 MED ORDER — SIGHTPATH DOSE#1 NA CHONDROIT SULF-NA HYALURON 40-17 MG/ML IO SOLN
INTRAOCULAR | Status: DC | PRN
  Administered 2023-10-05: 1 mL via INTRAOCULAR

## 2023-10-05 MED ORDER — MOXIFLOXACIN HCL 0.5 % OP SOLN
OPHTHALMIC | Status: DC | PRN
  Administered 2023-10-05: .2 mL via OPHTHALMIC

## 2023-10-05 MED ORDER — SIGHTPATH DOSE#1 BSS IO SOLN
INTRAOCULAR | Status: DC | PRN
Start: 2023-10-05 — End: 2023-10-05
  Administered 2023-10-05: 15 mL via INTRAOCULAR

## 2023-10-05 MED ORDER — FENTANYL CITRATE (PF) 100 MCG/2ML IJ SOLN
INTRAMUSCULAR | Status: AC
  Filled 2023-10-05: qty 2

## 2023-10-05 MED ORDER — TETRACAINE HCL 0.5 % OP SOLN
OPHTHALMIC | Status: AC
  Filled 2023-10-05: qty 4

## 2023-10-05 MED ORDER — TETRACAINE HCL 0.5 % OP SOLN
1.0000 [drp] | OPHTHALMIC | Status: DC | PRN
  Administered 2023-10-05 (×3): 1 [drp] via OPHTHALMIC

## 2023-10-05 MED ORDER — LIDOCAINE HCL (PF) 2 % IJ SOLN
INTRAOCULAR | Status: DC | PRN
  Administered 2023-10-05: 2 mL

## 2023-10-05 MED ORDER — MIDAZOLAM HCL 2 MG/2ML IJ SOLN
2.0000 mg | Freq: Once | INTRAMUSCULAR | Status: AC
  Administered 2023-10-05: 2 mg via INTRAVENOUS

## 2023-10-05 MED ORDER — FENTANYL CITRATE (PF) 100 MCG/2ML IJ SOLN
INTRAMUSCULAR | Status: DC | PRN
Start: 2023-10-05 — End: 2023-10-05
  Administered 2023-10-05: 50 ug via INTRAVENOUS

## 2023-10-05 MED ORDER — MIDAZOLAM HCL 2 MG/2ML IJ SOLN
INTRAMUSCULAR | Status: AC
Start: 2023-10-05 — End: ?
  Filled 2023-10-05: qty 2

## 2023-10-05 MED ORDER — BRIMONIDINE TARTRATE-TIMOLOL 0.2-0.5 % OP SOLN
OPHTHALMIC | Status: DC | PRN
  Administered 2023-10-05: 1 [drp] via OPHTHALMIC

## 2023-10-05 SURGICAL SUPPLY — 12 items
CATARACT SUITE SIGHTPATH (MISCELLANEOUS) ×1 IMPLANT
CYSTOTOME ANGL RVRS SHRT 25G (CUTTER) ×1 IMPLANT
CYSTOTOME ANGL RVRS SHRT 25GA (CUTTER) ×1 IMPLANT
FEE CATARACT SUITE SIGHTPATH (MISCELLANEOUS) ×1 IMPLANT
GLOVE BIOGEL PI IND STRL 8 (GLOVE) ×1 IMPLANT
GLOVE SURG LX STRL 8.0 MICRO (GLOVE) ×1 IMPLANT
GLOVE SURG PROTEXIS BL SZ6.5 (GLOVE) ×1 IMPLANT
GLOVE SURG SYN 6.5 PF PI BL (GLOVE) ×1 IMPLANT
LENS IOL TECNIS EYHANCE 23.5 (Intraocular Lens) IMPLANT
NDL FILTER BLUNT 18X1 1/2 (NEEDLE) ×1 IMPLANT
NEEDLE FILTER BLUNT 18X1 1/2 (NEEDLE) ×1 IMPLANT
SYR 3ML LL SCALE MARK (SYRINGE) ×1 IMPLANT

## 2023-10-05 NOTE — H&P (Signed)
 [facility]   Primary Care Physician:  Mimi Alt, MD Ophthalmologist: Dr. Jeb Miner  Pre-Procedure History & Physical: HPI:  Regina Deleon is a 73 y.o. female here for cataract surgery.   Past Medical History:  Diagnosis Date   Cancer (HCC)    lt br cancer   Edema leg    with chemo   GERD (gastroesophageal reflux disease)    also, diverticulosis   History of kidney stones    Hyperlipidemia    Hypertension    Neuropathy    hands and feet post chemo   Wears glasses     Past Surgical History:  Procedure Laterality Date   ABDOMINAL HYSTERECTOMY  1986   AXILLARY LYMPH NODE DISSECTION Left 06/26/2014   Procedure: AXILLARY LYMPH NODE DISSECTION;  Surgeon: Juanita Norlander, MD;  Location: [facility];  Service: General;  Laterality: Left;   BACK SURGERY  1990, 1991   lumbar   BREAST SURGERY Left 06/2014   CATARACT EXTRACTION W/PHACO Right 03/23/2019   Procedure: CATARACT EXTRACTION PHACO AND INTRAOCULAR LENS PLACEMENT (IOC) RIGHT VISION BLUE;  Surgeon: Ola Berger, MD;  Location: [facility];  Service: Ophthalmology;  Laterality: Right;  US  00:55.4 CDE 5.92 Fluid Pack lot # [id number]   COLONOSCOPY     COLONOSCOPY WITH PROPOFOL  N/A 12/29/2017   Procedure: COLONOSCOPY WITH PROPOFOL ;  Surgeon: Toledo, Alphonsus Jeans, MD;  Location: [facility];  Service: Gastroenterology;  Laterality: N/A;   CYSTOSCOPY/URETEROSCOPY/HOLMIUM LASER/STENT PLACEMENT Bilateral 07/28/2019   Procedure: CYSTOSCOPY/URETEROSCOPY/HOLMIUM LASER/STENT PLACEMENT;  Surgeon: Lawerence Pressman, MD;  Location: [facility];  Service: Urology;  Laterality: Bilateral;   DENTAL SURGERY Right    top    DILATION AND CURETTAGE OF UTERUS     LAPAROSCOPIC GASTRIC BANDING  10/19/05   MASTECTOMY Left 2016   MASTECTOMY MODIFIED RADICAL Left 06/26/2014   Procedure: MASTECTOMY MODIFIED RADICAL;  Surgeon: Juanita Norlander, MD;  Location: [facility];  Service: General;  Laterality: Left;   PORT A  CATH REVISION     insertion-10/15   PORT-A-CATH REMOVAL N/A 02/07/2015   Procedure: MINOR REMOVAL PORT-A-CATH;  Surgeon: Juanita Norlander, MD;  Location: [facility];  Service: General;  Laterality: N/A;    Prior to Admission medications   Medication Sig Start Date End Date Taking? Authorizing Provider  acetaminophen  (TYLENOL ) 500 MG tablet Take 1,000 mg by mouth every 6 (six) hours as needed for mild pain.    Yes [provider]  cetirizine (ZYRTEC) 10 MG tablet Take 10 mg by mouth daily as needed for allergies.   Yes [provider]  diclofenac  Sodium (VOLTAREN ) 1 % GEL Apply 4 g topically 4 (four) times daily as needed. 09/24/22  Yes Boscia, Heather E, NP  docusate sodium (COLACE) 100 MG capsule Take 200 mg by mouth daily.   Yes [provider]  dorzolamide-timolol (COSOPT) 22.3-6.8 MG/ML ophthalmic solution Place 1 drop into both eyes 2 (two) times daily. 01/23/19  Yes [provider]  gabapentin  (NEURONTIN ) 100 MG capsule Take 1 capsule (100 mg total) by mouth 2 (two) times daily. 07/06/23  Yes Simmons-Robinson, Makiera, MD  latanoprost (XALATAN) 0.005 % ophthalmic solution Place 1 drop into both eyes at bedtime. 01/23/19  Yes [provider]  methocarbamol  (ROBAXIN ) 500 MG tablet Take 1 tablet (500 mg total) by mouth 2 (two) times daily as needed for muscle spasms. 09/24/22  Yes Boscia, Rumaldo Countess, NP  NIFEdipine  (PROCARDIA -XL/NIFEDICAL-XL) 30 MG 24 hr tablet Take 1 tablet (30 mg total)  by mouth 2 (two) times daily. 04/05/23  Yes Simmons-Robinson, Makiera, MD  Omega 3 1000 MG CAPS Take 1 capsule (1,000 mg total) by mouth daily. 07/28/19  Yes Lawerence Pressman, MD  omeprazole (PRILOSEC OTC) 20 MG tablet Take 20 mg by mouth daily as needed (acid reflux).   Yes [provider]  rosuvastatin  (CRESTOR ) 5 MG tablet Take 0.5 tablets (2.5 mg total) by mouth daily. 05/26/22  Yes Boscia, Heather E, NP  triamterene -hydrochlorothiazide (MAXZIDE-25)  37.5-25 MG tablet TAKE 1 TABLET BY MOUTH EVERY DAY 02/19/23  Yes Simmons-Robinson, Makiera, MD  zolpidem  (AMBIEN ) 10 MG tablet TAKE 1 TABLET (10 MG TOTAL) BY MOUTH AT BEDTIME AS NEEDED FOR SLEEP 09/20/23  Yes Simmons-Robinson, Makiera, MD  fluticasone  (FLONASE ) 50 MCG/ACT nasal spray SPRAY 2 SPRAYS INTO EACH NOSTRIL EVERY DAY Patient not taking: Reported on 09/20/2023 10/21/20   Boscia, Heather E, NP  ondansetron  (ZOFRAN ) 4 MG tablet Take 1 tablet (4 mg total) by mouth every 8 (eight) hours as needed for nausea or vomiting. 07/31/19   Lawerence Pressman, MD    Allergies as of 09/15/2023 - Review Complete 07/08/2023  Allergen Reaction Noted   Darvocet [propoxyphene n-acetaminophen ] Shortness Of Breath and Swelling 12/23/2010   Latex Shortness Of Breath, Swelling, and Anaphylaxis 12/23/2010   Penicillins  12/23/2010   Cephalexin Itching 07/03/2019   Enalapril Cough 12/23/2010    Family History  Problem Relation Age of Onset   Asthma Mother    Diabetes Mother    Diabetes Father    Heart disease Father    Cancer Maternal Aunt    Cancer Cousin    Cancer Cousin    Cancer Cousin     Social History   Socioeconomic History   Marital status: Married    Spouse name: Melodee Spruce   Number of children: Not on file   Years of education: Not on file   Highest education level: Bachelor's degree (e.g., BA, AB, BS)  Occupational History   Not on file  Tobacco Use   Smoking status: Never   Smokeless tobacco: Never  Vaping Use   Vaping status: Never Used  Substance and Sexual Activity   Alcohol use: No   Drug use: No   Sexual activity: Yes  Other Topics Concern   Not on file  Social History Narrative   Not on file   Social Drivers of Health   Financial Resource Strain: Low Risk  (06/10/2023)   Overall Financial Resource Strain (CARDIA)    Difficulty of Paying Living Expenses: Not hard at all  Food Insecurity: No Food Insecurity (06/10/2023)   Hunger Vital Sign    Worried About Running Out of  Food in the Last Year: Never true    Ran Out of Food in the Last Year: Never true  Transportation Needs: No Transportation Needs (06/10/2023)   PRAPARE - Administrator, Civil Service (Medical): No    Lack of Transportation (Non-Medical): No  Physical Activity: Inactive (06/10/2023)   Exercise Vital Sign    Days of Exercise per Week: 0 days    Minutes of Exercise per Session: 0 min  Stress: No Stress Concern Present (06/10/2023)   [employer] of Occupational Health - Occupational Stress Questionnaire    Feeling of Stress : Not at all  Social Connections: Socially Integrated (06/10/2023)   Social Connection and Isolation Panel [NHANES]    Frequency of Communication with Friends and Family: More than three times a week    Frequency of Social  Gatherings with Friends and Family: More than three times a week    Attends Religious Services: More than 4 times per year    Active Member of Clubs or Organizations: Yes    Attends Engineer, structural: More than 4 times per year    Marital Status: Married  Catering manager Violence: Not At Risk (05/26/2022)   Humiliation, Afraid, Rape, and Kick questionnaire    Fear of Current or Ex-Partner: No    Emotionally Abused: No    Physically Abused: No    Sexually Abused: No    Review of Systems: See HPI, otherwise negative ROS  Physical Exam: BP 137/61   Temp 98.4 F (36.9 C) (Temporal)   Resp (!) 21   Ht 5' 2.99" (1.6 m)   Wt 90.1 kg   LMP 06/02/1984   SpO2 99%   BMI 35.21 kg/m  General:   Alert, cooperative. Head:  Normocephalic and atraumatic. Respiratory:  Normal work of breathing. Cardiovascular:  NAD  Impression/Plan: Alfonso Angles is here for cataract surgery.  Risks, benefits, limitations, and alternatives regarding cataract surgery have been reviewed with the patient.  Questions have been answered.  All parties agreeable.   Clair Crews, MD  10/05/2023, 12:19 PM

## 2023-10-05 NOTE — Op Note (Signed)
 PREOPERATIVE DIAGNOSIS:  Nuclear sclerotic cataract of the left eye.   POSTOPERATIVE DIAGNOSIS:  Nuclear sclerotic cataract of the left eye.   OPERATIVE PROCEDURE:ORPROCALL@   SURGEON:  Clair Crews, MD.   ANESTHESIA:  Anesthesiologist: Emilie Harden, MD CRNA: Sherrlyn Dolores, CRNA  1.      Managed anesthesia care. 2.     0.57ml of Shugarcaine was instilled following the paracentesis   COMPLICATIONS:  None.   TECHNIQUE:   Stop and chop   DESCRIPTION OF PROCEDURE:  The patient was examined and consented in the preoperative holding area where the aforementioned topical anesthesia was applied to the left eye and then brought back to the Operating Room where the left eye was prepped and draped in the usual sterile ophthalmic fashion and a lid speculum was placed. A paracentesis was created with the side port blade and the anterior chamber was filled with viscoelastic. A near clear corneal incision was performed with the steel keratome. A continuous curvilinear capsulorrhexis was performed with a cystotome followed by the capsulorrhexis forceps. Hydrodissection and hydrodelineation were carried out with BSS on a blunt cannula. The lens was removed in a stop and chop  technique and the remaining cortical material was removed with the irrigation-aspiration handpiece. The capsular bag was inflated with viscoelastic and the Technis [id number] lens was placed in the capsular bag without complication. The remaining viscoelastic was removed from the eye with the irrigation-aspiration handpiece. The wounds were hydrated. The anterior chamber was flushed with BSS and the eye was inflated to physiologic pressure. 0.57ml Vigamox  was placed in the anterior chamber. The wounds were found to be water tight. The eye was dressed with Combigan. The patient was given protective glasses to wear throughout the day and a shield with which to sleep tonight. The patient was also given drops with which to begin a drop regimen  today and will follow-up with me in one day. Implant Name Type Inv. Item Serial No. Manufacturer Lot No. LRB No. Used Action  LENS IOL TECNIS EYHANCE 23.5 - [id number] Intraocular Lens LENS IOL TECNIS EYHANCE 23.5 [id number] [employer]  Left 1 Implanted    Procedure(s): PHACOEMULSIFICATION, CATARACT, WITH IOL INSERTION 10.26 01:06.9 (Left)  Electronically signed: Clair Crews 10/05/2023 12:43 PM

## 2023-10-05 NOTE — Transfer of Care (Signed)
 Immediate Anesthesia Transfer of Care Note  Patient: Regina Deleon  Procedure(s) Performed: PHACOEMULSIFICATION, CATARACT, WITH IOL INSERTION 10.26 01:06.9 (Left: Eye)  Patient Location: PACU  Anesthesia Type: MAC  Level of Consciousness: awake, alert  and patient cooperative  Airway and Oxygen Therapy: Patient Spontanous Breathing and Patient connected to supplemental oxygen  Post-op Assessment: Post-op Vital signs reviewed, Patient's Cardiovascular Status Stable, Respiratory Function Stable, Patent Airway and No signs of Nausea or vomiting  Post-op Vital Signs: Reviewed and stable  Complications: No notable events documented.

## 2023-10-05 NOTE — Anesthesia Postprocedure Evaluation (Signed)
 Anesthesia Post Note  Patient: CONNELLY SPRUELL  Procedure(s) Performed: PHACOEMULSIFICATION, CATARACT, WITH IOL INSERTION 10.26 01:06.9 (Left: Eye)  Patient location during evaluation: PACU Anesthesia Type: MAC Level of consciousness: awake and alert Pain management: pain level controlled Vital Signs Assessment: post-procedure vital signs reviewed and stable Respiratory status: spontaneous breathing, nonlabored ventilation, respiratory function stable and patient connected to nasal cannula oxygen Cardiovascular status: stable and blood pressure returned to baseline Postop Assessment: no apparent nausea or vomiting Anesthetic complications: no   No notable events documented.   Last Vitals:  Vitals:   10/05/23 1243 10/05/23 1249  BP: (!) 140/84 (!) 146/84  Pulse: 75 77  Resp: 14 (!) 23  Temp: (!) 36.2 C (!) 36.2 C  SpO2: 98% 96%    Last Pain:  Vitals:   10/05/23 1249  TempSrc:   PainSc: 0-No pain                 Taralyn Ferraiolo C Leonilda Cozby

## 2023-10-12 ENCOUNTER — Ambulatory Visit: Admitting: Family Medicine

## 2023-10-15 ENCOUNTER — Ambulatory Visit: Admitting: Family Medicine

## 2023-10-15 ENCOUNTER — Other Ambulatory Visit: Payer: Self-pay | Admitting: Family Medicine

## 2023-10-15 ENCOUNTER — Encounter: Payer: Self-pay | Admitting: Family Medicine

## 2023-10-15 ENCOUNTER — Telehealth: Payer: Self-pay

## 2023-10-15 VITALS — BP 144/80 | HR 82 | Ht 62.0 in | Wt 199.0 lb

## 2023-10-15 DIAGNOSIS — Z6836 Body mass index (BMI) 36.0-36.9, adult: Secondary | ICD-10-CM

## 2023-10-15 DIAGNOSIS — E66812 Morbid (severe) obesity due to excess calories: Secondary | ICD-10-CM

## 2023-10-15 DIAGNOSIS — E782 Mixed hyperlipidemia: Secondary | ICD-10-CM | POA: Diagnosis not present

## 2023-10-15 DIAGNOSIS — I10 Essential (primary) hypertension: Secondary | ICD-10-CM

## 2023-10-15 DIAGNOSIS — M545 Low back pain, unspecified: Secondary | ICD-10-CM | POA: Diagnosis not present

## 2023-10-15 DIAGNOSIS — M5417 Radiculopathy, lumbosacral region: Secondary | ICD-10-CM

## 2023-10-15 DIAGNOSIS — F5101 Primary insomnia: Secondary | ICD-10-CM

## 2023-10-15 DIAGNOSIS — T451X5A Adverse effect of antineoplastic and immunosuppressive drugs, initial encounter: Secondary | ICD-10-CM

## 2023-10-15 DIAGNOSIS — G620 Drug-induced polyneuropathy: Secondary | ICD-10-CM | POA: Diagnosis not present

## 2023-10-15 DIAGNOSIS — G894 Chronic pain syndrome: Secondary | ICD-10-CM

## 2023-10-15 MED ORDER — ZEPBOUND 2.5 MG/0.5ML ~~LOC~~ SOAJ: 2.5000 mg | SUBCUTANEOUS | 4 refills | Status: DC

## 2023-10-15 MED ORDER — METHOCARBAMOL 500 MG PO TABS: 500.0000 mg | ORAL_TABLET | Freq: Two times a day (BID) | ORAL | 5 refills | Status: AC | PRN

## 2023-10-15 NOTE — Telephone Encounter (Signed)
 Copied from CRM [id number]. Topic: General - Other >> Oct 15, 2023 12:48 PM Emylou G wrote: Reason for CRM: patient called - wanted to discuss more about zepbound and if she can get a coupon for it.

## 2023-10-15 NOTE — Progress Notes (Signed)
 Established patient visit   Patient: Regina Deleon   DOB: May 27, 1950   73 y.o. Female  MRN: [id number] Visit Date: 10/15/2023  Today's healthcare provider: Mimi Alt, MD   Chief Complaint  Patient presents with  . Neuropathy    Still having pain in her feet, she is having lower back pain as well    Subjective     HPI     Neuropathy    Additional comments: Still having pain in her feet, she is having lower back pain as well       Last edited by Bart Lieu, CMA on 10/15/2023 10:19 AM.       Discussed the use of AI scribe software for clinical note transcription with the patient, who gave verbal consent to proceed.  History of Present Illness Regina Deleon is a 73 year old female with neuropathy and low back pain who presents with pain management concerns.  She experiences significant neuropathic pain in her feet, attributed to a cancer diagnosis approximately ten years ago. The pain worsens in cold temperatures, especially at night, leading to discomfort and difficulty sleeping. She takes gabapentin  100 mg twice a day, which provides some relief.  She has low back pain, primarily on the left side, and mentions having a bulging disc. She previously used Flexeril  for muscle spasms but found it too sedating. She now uses Robaxin  500 mg twice a day, which she finds more tolerable. The back pain impacts her mobility, making it difficult to engage in physical activities such as walking or using a bicycle.  She has a history of insomnia and currently takes Ambien  10 mg to aid her sleep. She also takes Crestor  2.5 mg for cholesterol management, triamterene  37.5 mg and hydrochlorothiazide 25 mg for blood pressure control, and nifedipine  30 mg twice a day as needed for blood pressure.  She wants to lose weight, believing it would improve her mobility and overall well-being. She used to enjoy walking but currently finds it challenging due to her pain and  weight.  No history of thyroid  problems or thyroid  cancer in her family.     Past Medical History:  Diagnosis Date  . Cancer (HCC)    lt br cancer  . Edema leg    with chemo  . GERD (gastroesophageal reflux disease)    also, diverticulosis  . History of kidney stones   . Hyperlipidemia   . Hypertension   . Neuropathy    hands and feet post chemo  . Wears glasses     Medications: Outpatient Medications Prior to Visit  Medication Sig  . acetaminophen  (TYLENOL ) 500 MG tablet Take 1,000 mg by mouth every 6 (six) hours as needed for mild pain.   . cetirizine (ZYRTEC) 10 MG tablet Take 10 mg by mouth daily as needed for allergies.  . diclofenac  Sodium (VOLTAREN ) 1 % GEL Apply 4 g topically 4 (four) times daily as needed.  . docusate sodium (COLACE) 100 MG capsule Take 200 mg by mouth daily.  . dorzolamide-timolol  (COSOPT) 22.3-6.8 MG/ML ophthalmic solution Place 1 drop into both eyes 2 (two) times daily.  . gabapentin  (NEURONTIN ) 100 MG capsule Take 1 capsule (100 mg total) by mouth 2 (two) times daily.  Aaron Aas latanoprost (XALATAN) 0.005 % ophthalmic solution Place 1 drop into both eyes at bedtime.  . NIFEdipine  (PROCARDIA -XL/NIFEDICAL-XL) 30 MG 24 hr tablet Take 1 tablet (30 mg total) by mouth 2 (two) times daily.  . Omega 3 1000  MG CAPS Take 1 capsule (1,000 mg total) by mouth daily.  Aaron Aas omeprazole (PRILOSEC OTC) 20 MG tablet Take 20 mg by mouth daily as needed (acid reflux).  . rosuvastatin  (CRESTOR ) 5 MG tablet Take 0.5 tablets (2.5 mg total) by mouth daily.  . triamterene -hydrochlorothiazide (MAXZIDE-25) 37.5-25 MG tablet TAKE 1 TABLET BY MOUTH EVERY DAY  . zolpidem  (AMBIEN ) 10 MG tablet TAKE 1 TABLET (10 MG TOTAL) BY MOUTH AT BEDTIME AS NEEDED FOR SLEEP  . [DISCONTINUED] methocarbamol  (ROBAXIN ) 500 MG tablet Take 1 tablet (500 mg total) by mouth 2 (two) times daily as needed for muscle spasms.  . fluticasone  (FLONASE ) 50 MCG/ACT nasal spray SPRAY 2 SPRAYS INTO EACH NOSTRIL EVERY  DAY (Patient not taking: Reported on 10/15/2023)  . ondansetron  (ZOFRAN ) 4 MG tablet Take 1 tablet (4 mg total) by mouth every 8 (eight) hours as needed for nausea or vomiting. (Patient not taking: Reported on 10/15/2023)   No facility-administered medications prior to visit.    Review of Systems  Last metabolic panel Lab Results  Component Value Date   GLUCOSE 112 (H) 05/19/2022   NA 140 05/19/2022   K 4.3 05/19/2022   CL 102 05/19/2022   CO2 24 05/19/2022   BUN 14 05/19/2022   CREATININE 0.89 05/19/2022   EGFR 69 05/19/2022   CALCIUM  10.5 (H) 05/19/2022   PROT 7.1 05/19/2022   ALBUMIN 4.3 05/19/2022   LABGLOB 2.8 05/19/2022   AGRATIO 1.5 05/19/2022   BILITOT 0.3 05/19/2022   ALKPHOS 83 05/19/2022   AST 15 05/19/2022   ALT 11 05/19/2022   ANIONGAP 11 07/28/2019   Last hemoglobin A1c Lab Results  Component Value Date   HGBA1C 5.8 (H) 05/19/2022   Last thyroid  functions Lab Results  Component Value Date   TSH 0.627 05/19/2022        Objective    BP (!) 144/80   Pulse 82   Ht 5' 2 (1.575 m)   Wt 199 lb (90.3 kg)   LMP 06/02/1984   SpO2 100%   BMI 36.40 kg/m  BP Readings from Last 3 Encounters:  10/15/23 (!) 144/80  10/05/23 (!) 146/84  07/08/23 (!) 111/57   Wt Readings from Last 3 Encounters:  10/15/23 199 lb (90.3 kg)  10/05/23 198 lb 11.2 oz (90.1 kg)  07/08/23 196 lb (88.9 kg)        Physical Exam  Physical Exam GENERAL: Tired appearing but non-toxic. CHEST: Lungs clear to auscultation bilaterally. CARDIOVASCULAR: Regular rhythm. NEUROLOGICAL: Negative left straight leg raise test.    No results found for any visits on 10/15/23.  Assessment & Plan     Problem List Items Addressed This Visit       Cardiovascular and Mediastinum   Essential hypertension     Nervous and Auditory   Neuropathy due to chemotherapeutic drug (HCC) - Primary   Lumbosacral radiculopathy   Relevant Medications   methocarbamol  (ROBAXIN ) 500 MG tablet    tirzepatide (ZEPBOUND) 2.5 MG/0.5ML Pen     Other   Primary insomnia   Low back pain   Relevant Medications   methocarbamol  (ROBAXIN ) 500 MG tablet   Hyperlipidemia   Chronic pain syndrome (Chronic)   Relevant Medications   methocarbamol  (ROBAXIN ) 500 MG tablet   BMI 34.0-34.9,adult   Relevant Medications   tirzepatide (ZEPBOUND) 2.5 MG/0.5ML Pen    Assessment & Plan Low Back Pain Chronic low back pain with spasms on the left side, associated with a bulging disc. Negative left straight leg raise  test. Robaxin  500 mg twice daily effectively manages spasms without excessive sedation, unlike Flexeril . - Prescribe Robaxin  500 mg twice daily as needed for back pain with refills.  Obesity Obesity with BMI of 36, contributing to low back pain and decreased mobility. Discussed weight reduction benefits on back pain and overall health. No thyroid  issues, making her a candidate for weight management medication. Discussed Tirzepatide, starting at 2.5 mg once weekly, pending insurance approval. If not covered, cost ranges from $350 to $500 per month. Exercise is limited due to back and leg pain - Prescribe Tirzepatide 2.5 mg once weekly, pending insurance approval. - encouraged well balanced meals to assist with weight management  - Order complete metabolic panel, A1c, and thyroid  function tests.  Peripheral Neuropathy Chronic neuropathy in the feet, likely secondary to chemotherapy from cancer treatment 10 years ago. Symptoms include pain and cold sensation, particularly at night. Gabapentin  100 mg twice daily effectively manages symptoms. - Continue gabapentin  100 mg twice daily for neuropathy.  Hypertension Hypertension managed with triamterene /hydrochlorothiazide and nifedipine . Current blood pressure is 144 mmHg, indicating need for continued medication adherence. - Continue triamterene  37.5 mg/hydrochlorothiazide 25 mg daily. - Continue nifedipine  30 mg twice  daily.  Hyperlipidemia Hyperlipidemia managed with Crestor  2.5 mg daily. - Continue Crestor  2.5 mg daily.  Insomnia Chronic insomnia managed with Ambien  10 mg, which aids sleep but does not provide long duration. - Continue Ambien  10 mg for sleep.     Return in about 4 months (around 02/14/2024) for CHRONIC F/U.         Mimi Alt, MD  [facility] [phone] (phone) [phone] (fax)  [facility]

## 2023-10-15 NOTE — Telephone Encounter (Signed)
 Please submit PA, I have forwarded the note to PA team

## 2023-10-16 LAB — CMP14+EGFR
ALT: 9 IU/L (ref 0–32)
AST: 16 IU/L (ref 0–40)
Albumin: 4.4 g/dL (ref 3.8–4.8)
Alkaline Phosphatase: 100 IU/L (ref 44–121)
BUN/Creatinine Ratio: 13 (ref 12–28)
BUN: 12 mg/dL (ref 8–27)
Bilirubin Total: <0.2 mg/dL (ref 0.0–1.2)
CO2: 21 mmol/L (ref 20–29)
Calcium: 10.5 mg/dL — ABNORMAL HIGH (ref 8.7–10.3)
Chloride: 101 mmol/L (ref 96–106)
Creatinine, Ser: 0.96 mg/dL (ref 0.57–1.00)
Globulin, Total: 3.3 g/dL (ref 1.5–4.5)
Glucose: 77 mg/dL (ref 70–99)
Potassium: 3.5 mmol/L (ref 3.5–5.2)
Sodium: 140 mmol/L (ref 134–144)
Total Protein: 7.7 g/dL (ref 6.0–8.5)
eGFR: 62 mL/min/{1.73_m2} (ref >59)

## 2023-10-16 LAB — TSH+T4F+T3FREE
Free T4: 0.94 ng/dL (ref 0.82–1.77)
T3, Free: 2.5 pg/mL (ref 2.0–4.4)
TSH: 0.572 u[IU]/mL (ref 0.450–4.500)

## 2023-10-16 LAB — HEMOGLOBIN A1C
Est. average glucose Bld gHb Est-mCnc: 126 mg/dL
Hgb A1c MFr Bld: 6 % — ABNORMAL HIGH (ref 4.8–5.6)

## 2023-10-18 ENCOUNTER — Telehealth: Payer: Self-pay

## 2023-10-18 ENCOUNTER — Other Ambulatory Visit (HOSPITAL_COMMUNITY): Payer: Self-pay

## 2023-10-18 NOTE — Telephone Encounter (Signed)
 Patient returned call and stated that her insurance will not cover Zepbound so she needs to get that straight first and that she will call us  back once that is handled

## 2023-10-18 NOTE — Telephone Encounter (Signed)
FYI please see the message below.

## 2023-10-18 NOTE — Telephone Encounter (Signed)
 Attempted to contact the pt, in regards to the medication denial (Zepbound) if she returns the call, please schedule her for a video visit per Dr R to discuss other options

## 2023-10-18 NOTE — Telephone Encounter (Signed)
 Pharmacy Patient Advocate Encounter   Received notification from Physician's Office that prior authorization for Zepbound 2.5 mg/0.5 ml pen is required/requested.   Insurance verification completed.   The patient is insured through [facility] ADVANTAGE/RX ADVANCE .   Per test claim: Product not on formulary

## 2023-10-18 NOTE — Telephone Encounter (Signed)
 Pt is not covered for zepbound. Please notify patient and schedule for virtual visit to discuss other options for weight management

## 2023-10-19 ENCOUNTER — Ambulatory Visit: Payer: Self-pay | Admitting: Family Medicine

## 2023-10-21 ENCOUNTER — Other Ambulatory Visit: Payer: Self-pay | Admitting: Family Medicine

## 2023-10-21 DIAGNOSIS — F5101 Primary insomnia: Secondary | ICD-10-CM

## 2023-10-21 NOTE — Telephone Encounter (Signed)
 Copied from CRM [id number]. Topic: Clinical - Medication Refill >> Oct 21, 2023  9:38 AM Regina Deleon wrote: Medication: zolpidem  (AMBIEN ) 10 MG tablet  Has the patient contacted their pharmacy? Yes (Agent: If no, request that the patient contact the pharmacy for the refill. If patient does not wish to contact the pharmacy document the reason why and proceed with request.) (Agent: If yes, when and what did the pharmacy advise?)  This is the patient's preferred pharmacy:  [id number] - [place], [place] - [address] [place] [address] Phone: [phone] Fax: [phone]  Is this the correct pharmacy for this prescription? Yes If no, delete pharmacy and type the correct one.   Has the prescription been filled recently? No  Is the patient out of the medication? Yes  Has the patient been seen for an appointment in the last year OR does the patient have an upcoming appointment? Yes  Can we respond through MyChart? Yes  Agent: Please be advised that Rx refills may take up to 3 business days. We ask that you follow-up with your pharmacy.

## 2023-10-22 ENCOUNTER — Telehealth: Payer: Self-pay | Admitting: Family Medicine

## 2023-10-22 ENCOUNTER — Other Ambulatory Visit: Payer: Self-pay | Admitting: Family Medicine

## 2023-10-22 DIAGNOSIS — F5101 Primary insomnia: Secondary | ICD-10-CM

## 2023-10-22 NOTE — Telephone Encounter (Unsigned)
 Copied from CRM [id number]. Topic: Clinical - Medication Refill >> Oct 22, 2023  5:08 PM Phil Braun wrote: Medication: zolpidem  (AMBIEN ) 10 MG tablet   Has the patient contacted their pharmacy? Yes   This is the patient's preferred pharmacy:  [id number] - [place], [place] - [address] [place] [address] Phone: [phone] Fax: [phone]  Is this the correct pharmacy for this prescription? Yes If no, delete pharmacy and type the correct one.   Has the prescription been filled recently? No  Is the patient out of the medication? Yes  Has the patient been seen for an appointment in the last year OR does the patient have an upcoming appointment? Yes  Can we respond through MyChart? No  Agent: Please be advised that Rx refills may take up to 3 business days. We ask that you follow-up with your pharmacy.

## 2023-10-22 NOTE — Telephone Encounter (Signed)
 Requested medications are due for refill today.  yes  Requested medications are on the active medications list.  yes  Last refill. 09/20/2023 #30 0 rf  Future visit scheduled.   yes  Notes to clinic.  Refill not delegated.    Requested Prescriptions  Pending Prescriptions Disp Refills   zolpidem  (AMBIEN ) 10 MG tablet 30 tablet 0    Sig: Take 1 tablet (10 mg total) by mouth at bedtime as needed. for sleep     Not Delegated - Psychiatry:  Anxiolytics/Hypnotics Failed - 10/22/2023  5:11 PM      Failed - This refill cannot be delegated      Failed - Urine Drug Screen completed in last 360 days      Passed - Valid encounter within last 6 months    Recent Outpatient Visits           1 week ago Neuropathy due to chemotherapeutic drug [facility])   [place] [facility] Simmons-Robinson, [place], MD   3 months ago Neuropathy due to chemotherapeutic drug [facility])   [place] [facility] Simmons-Robinson, [place], MD   4 months ago Encounter for annual wellness visit (AWV) in Medicare patient   [place] [facility] [place], Judyann Number, MD

## 2023-10-25 NOTE — Telephone Encounter (Signed)
 Refill request has been sent to provider

## 2023-11-26 ENCOUNTER — Other Ambulatory Visit: Payer: Self-pay | Admitting: Family Medicine

## 2023-11-26 ENCOUNTER — Telehealth: Payer: Self-pay | Admitting: Family Medicine

## 2023-11-26 DIAGNOSIS — F5101 Primary insomnia: Secondary | ICD-10-CM

## 2023-11-26 NOTE — Telephone Encounter (Signed)
 Copied from CRM #[id number]. Topic: Clinical - Medication Refill >> Nov 26, 2023  8:24 AM Ivette P wrote: Medication: zolpidem  (AMBIEN ) 10 MG tablet  Has the patient contacted their pharmacy? Yes (Agent: If no, request that the patient contact the pharmacy for the refill. If patient does not wish to contact the pharmacy document the reason why and proceed with request.) (Agent: If yes, when and what did the pharmacy advise?)  This is the patient's preferred pharmacy:  [id number] - [place], [place] - [address] [place] [address] Phone: [phone] Fax: [phone]  Is this the correct pharmacy for this prescription? Yes If no, delete pharmacy and type the correct one.   Has the prescription been filled recently? No, 10/25/2023  Is the patient out of the medication? Yes  Has the patient been seen for an appointment in the last year OR does the patient have an upcoming appointment? Yes, 02/14/2024  Can we respond through MyChart? Yes  Agent: Please be advised that Rx refills may take up to 3 business days. We ask that you follow-up with your pharmacy.

## 2023-11-26 NOTE — Telephone Encounter (Signed)
 LOV 10/15/23 NOV 02/14/24 LRF 10/25/23 30 x 0

## 2023-11-29 NOTE — Telephone Encounter (Signed)
 Requested medication (s) are due for refill today: yes  Requested medication (s) are on the active medication list: yes  Last refill:  10/25/23  Future visit scheduled: yes  Notes to clinic:  Unable to refill per protocol, cannot delegate.      Requested Prescriptions  Pending Prescriptions Disp Refills   zolpidem  (AMBIEN ) 10 MG tablet 30 tablet 0    Sig: Take 1 tablet (10 mg total) by mouth at bedtime as needed. for sleep     Not Delegated - Psychiatry:  Anxiolytics/Hypnotics Failed - 11/29/2023  8:59 AM      Failed - This refill cannot be delegated      Failed - Urine Drug Screen completed in last 360 days      Passed - Valid encounter within last 6 months    Recent Outpatient Visits           1 month ago Neuropathy due to chemotherapeutic drug [facility])   [place] [facility] Simmons-Robinson, Faith, MD   4 months ago Neuropathy due to chemotherapeutic drug [facility])   [place] [facility] Simmons-Robinson, Burchard, MD   5 months ago Encounter for annual wellness visit (AWV) in Medicare patient   [place] [facility] [place], Rockie, MD

## 2023-11-30 MED ORDER — ZOLPIDEM TARTRATE 10 MG PO TABS: 10.0000 mg | ORAL_TABLET | Freq: Every evening | ORAL | 3 refills | Status: DC | PRN

## 2024-02-03 ENCOUNTER — Other Ambulatory Visit: Payer: Self-pay | Admitting: Hematology and Oncology

## 2024-02-03 DIAGNOSIS — Z1231 Encounter for screening mammogram for malignant neoplasm of breast: Secondary | ICD-10-CM

## 2024-02-14 ENCOUNTER — Encounter: Payer: Self-pay | Admitting: Family Medicine

## 2024-02-14 ENCOUNTER — Telehealth: Payer: Self-pay

## 2024-02-14 ENCOUNTER — Ambulatory Visit: Admitting: Family Medicine

## 2024-02-14 VITALS — BP 136/68 | HR 90 | Temp 98.4°F | Ht 62.0 in | Wt 193.5 lb

## 2024-02-14 DIAGNOSIS — I10 Essential (primary) hypertension: Secondary | ICD-10-CM | POA: Diagnosis not present

## 2024-02-14 DIAGNOSIS — E782 Mixed hyperlipidemia: Secondary | ICD-10-CM | POA: Diagnosis not present

## 2024-02-14 DIAGNOSIS — Z853 Personal history of malignant neoplasm of breast: Secondary | ICD-10-CM

## 2024-02-14 DIAGNOSIS — M170 Bilateral primary osteoarthritis of knee: Secondary | ICD-10-CM | POA: Diagnosis not present

## 2024-02-14 DIAGNOSIS — Z23 Encounter for immunization: Secondary | ICD-10-CM | POA: Diagnosis not present

## 2024-02-14 DIAGNOSIS — Z6834 Body mass index (BMI) 34.0-34.9, adult: Secondary | ICD-10-CM

## 2024-02-14 DIAGNOSIS — R600 Localized edema: Secondary | ICD-10-CM

## 2024-02-14 DIAGNOSIS — G620 Drug-induced polyneuropathy: Secondary | ICD-10-CM

## 2024-02-14 DIAGNOSIS — F5101 Primary insomnia: Secondary | ICD-10-CM | POA: Diagnosis not present

## 2024-02-14 DIAGNOSIS — M549 Dorsalgia, unspecified: Secondary | ICD-10-CM

## 2024-02-14 DIAGNOSIS — K1120 Sialoadenitis, unspecified: Secondary | ICD-10-CM

## 2024-02-14 DIAGNOSIS — G894 Chronic pain syndrome: Secondary | ICD-10-CM | POA: Diagnosis not present

## 2024-02-14 MED ORDER — TRAMADOL HCL 50 MG PO TABS: 50.0000 mg | ORAL_TABLET | Freq: Three times a day (TID) | ORAL | 0 refills | Status: AC | PRN

## 2024-02-14 MED ORDER — SULFAMETHOXAZOLE-TRIMETHOPRIM 800-160 MG PO TABS: 1.0000 | ORAL_TABLET | Freq: Two times a day (BID) | ORAL | 0 refills | Status: AC

## 2024-02-14 MED ORDER — ROSUVASTATIN CALCIUM 5 MG PO TABS: 2.5000 mg | ORAL_TABLET | Freq: Every day | ORAL | 1 refills | Status: AC

## 2024-02-14 MED ORDER — FLUCONAZOLE 150 MG PO TABS: ORAL_TABLET | ORAL | 0 refills | Status: AC

## 2024-02-14 MED ORDER — KETOROLAC TROMETHAMINE 30 MG/ML IJ SOLN: 30.0000 mg | Freq: Once | INTRAMUSCULAR | Status: DC

## 2024-02-14 MED ORDER — ZOLPIDEM TARTRATE 10 MG PO TABS: 10.0000 mg | ORAL_TABLET | Freq: Every evening | ORAL | 5 refills | Status: AC | PRN

## 2024-02-14 MED ORDER — DICLOFENAC SODIUM 1 % EX GEL: 4.0000 g | Freq: Four times a day (QID) | CUTANEOUS | 6 refills | Status: AC | PRN

## 2024-02-14 MED ORDER — KETOROLAC TROMETHAMINE 60 MG/2ML IM SOLN
60.0000 mg | Freq: Once | INTRAMUSCULAR | Status: AC
  Administered 2024-02-14: 30 mg via INTRAMUSCULAR

## 2024-02-14 MED ORDER — CLINDAMYCIN HCL 300 MG PO CAPS
300.0000 mg | ORAL_CAPSULE | Freq: Three times a day (TID) | ORAL | 0 refills | Status: AC
Start: 2024-02-14 — End: ?

## 2024-02-14 NOTE — Progress Notes (Signed)
 Established patient visit   Patient: Regina Deleon   DOB: February 14, 1951   73 y.o. Female  MRN: [id number] Visit Date: 02/14/2024  Today's healthcare provider: Rockie Agent, MD   Chief Complaint  Patient presents with   Medical Management of Chronic Issues    Reports that she is having an issue with her salivary gland on the left side. Reports that it is swollen and painful    Back Pain    Patient reports back pain today, same areas as before. Left side down left leg. Would like to discuss something for pain   Subjective     HPI     Medical Management of Chronic Issues    Additional comments: Reports that she is having an issue with her salivary gland on the left side. Reports that it is swollen and painful         Back Pain    Additional comments: Patient reports back pain today, same areas as before. Left side down left leg. Would like to discuss something for pain      Last edited by Cherry Chiquita HERO, CMA on 02/14/2024  8:16 AM.       Discussed the use of AI scribe software for clinical note transcription with the patient, who gave verbal consent to proceed.  History of Present Illness Regina Deleon is a 73 year old female with a history of left breast cancer and neuropathy who presents with left-sided back pain radiating to her leg and issues with her left salivary gland.  She experiences persistent left-sided back pain radiating down her left leg to her toes, exacerbated by prolonged sitting, standing, or walking. She has a history of lumbar radiculopathy and sciatica. She reports that her pain is on the left side, where she believes she has a bulging disc. Previous pain management included injections, which provided temporary relief. Current medications for pain include Robaxin  500 mg twice daily as needed and gabapentin  100 mg twice daily, which help alleviate the pain.  She has swelling and pain in her left salivary gland, extending down to her  shoulder. An ENT specialist evaluated her, and a biopsy was negative for cancer. She was treated with clindamycin and prednisone , which helped with the pain. She has not pursued surgical intervention for the gland issue.  Her medication regimen includes nifedipine  30 mg twice daily and triamterene  37.5 mg with hydrochlorothiazide 25 mg daily for hypertension, Crestor  2.5 mg daily for hyperlipidemia, omeprazole 20 mg daily as needed for reflux, Ambien  10 mg daily for insomnia, and cetirizine 10 mg as needed for allergic rhinitis. She also takes omega-3 1000 mg capsules daily and uses Voltaren  gel every other day for knee osteoarthritis.  She has a history of elevated calcium  levels, with the last measurement being 10.5. She recalls a past incident of kidney stones and was advised to stop taking calcium  supplements by a previous doctor. Her last A1c was in the prediabetes range at 6.0, but she declines to repeat the test today.     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    cetirizine (ZYRTEC) 10 MG tablet Take 10 mg by mouth daily as needed for allergies.   docusate sodium (COLACE) 100 MG capsule Take 200 mg by mouth daily.   dorzolamide-timolol  (COSOPT) 22.3-6.8 MG/ML ophthalmic solution Place 1 drop into both eyes 2 (two) times daily.   fluticasone  (FLONASE ) 50 MCG/ACT nasal spray SPRAY 2 SPRAYS INTO EACH NOSTRIL EVERY DAY   gabapentin  (NEURONTIN ) 100 MG capsule Take 1 capsule (100 mg total)  by mouth 2 (two) times daily.   latanoprost (XALATAN) 0.005 % ophthalmic solution Place 1 drop into both eyes at bedtime.   methocarbamol  (ROBAXIN ) 500 MG tablet Take 1 tablet (500 mg total) by mouth 2 (two) times daily as needed for muscle spasms.   NIFEdipine  (PROCARDIA -XL/NIFEDICAL-XL) 30 MG 24 hr tablet Take 1 tablet (30 mg total) by mouth 2 (two) times daily.   Omega 3 1000 MG CAPS Take 1 capsule (1,000 mg total) by mouth daily.   omeprazole (PRILOSEC OTC) 20 MG tablet Take 20 mg by mouth daily as needed (acid reflux).   ondansetron  (ZOFRAN ) 4 MG tablet Take 1 tablet (4 mg total) by mouth every 8 (eight) hours as needed for nausea or vomiting.   tirzepatide  (ZEPBOUND ) 2.5 MG/0.5ML Pen INJECT 2.5 MG SUBCUTANEOUSLY WEEKLY   triamterene -hydrochlorothiazide (MAXZIDE-25) 37.5-25 MG tablet TAKE 1 TABLET BY MOUTH EVERY DAY   [DISCONTINUED] diclofenac  Sodium (VOLTAREN ) 1 % GEL Apply 4 g topically 4 (four) times daily as needed.   [DISCONTINUED] rosuvastatin  (CRESTOR ) 5 MG tablet Take 0.5 tablets (2.5 mg total) by mouth daily.   [DISCONTINUED] zolpidem  (AMBIEN ) 10 MG tablet Take 1 tablet (10 mg total) by mouth at bedtime as needed. for sleep   No facility-administered medications prior to visit.    Review of Systems  Last CBC Lab Results  Component Value Date   WBC 6.8 05/19/2022   HGB 10.2 (L) 05/19/2022   HCT 32.4 (L) 05/19/2022   MCV 80 05/19/2022   MCH 25.2 (L) 05/19/2022   RDW 14.5 05/19/2022   PLT 117 (L) 05/19/2022   Last metabolic panel Lab Results  Component Value Date   GLUCOSE 77 10/15/2023   NA 140 10/15/2023   K 3.5 10/15/2023   CL 101 10/15/2023   CO2 21 10/15/2023   BUN 12 10/15/2023   CREATININE 0.96 10/15/2023   EGFR 62 10/15/2023   CALCIUM  10.5 (H) 10/15/2023   PROT 7.7 10/15/2023   ALBUMIN 4.4 10/15/2023   LABGLOB 3.3 10/15/2023   AGRATIO 1.5 05/19/2022   BILITOT <0.2 10/15/2023   ALKPHOS 100 10/15/2023   AST 16 10/15/2023   ALT 9 10/15/2023   ANIONGAP  11 07/28/2019   Last lipids Lab Results  Component Value Date   CHOL 300 (H) 05/19/2022   HDL 61 05/19/2022   LDLCALC 207 (H) 05/19/2022   TRIG 173 (H) 05/19/2022   CHOLHDL 4.9 (H) 05/19/2022  The 10-year ASCVD risk score (Arnett DK, et al., 2019) is: 45.1%  Last hemoglobin A1c Lab Results  Component Value Date   HGBA1C 6.0 (H) 10/15/2023   Last thyroid  functions Lab Results  Component Value Date   TSH 0.572 10/15/2023          Objective    BP 136/68 (BP Location: Right Arm, Patient Position: Sitting, Cuff Size: Normal)  Pulse 90   Temp 98.4 F (36.9 C) (Oral)   Ht 5' 2 (1.575 m)   Wt 193 lb 8 oz (87.8 kg)   LMP 06/02/1984   SpO2 100%   BMI 35.39 kg/m   BP Readings from Last 3 Encounters:  02/14/24 136/68  10/15/23 (!) 144/80  10/05/23 (!) 146/84   Wt Readings from Last 3 Encounters:  02/14/24 193 lb 8 oz (87.8 kg)  10/15/23 199 lb (90.3 kg)  10/05/23 198 lb 11.2 oz (90.1 kg)        Physical Exam  Physical Exam NECK: Tenderness in left parotid region extending to left neck and supraclavicular area, no redness or facial asymmetry. CARDIOVASCULAR: Regular rate and rhythm, S1 and S2 normal without murmurs. MUSCULOSKELETAL: Tenderness at left lumbar region and from left thoracic region with radiation to left foot. Normal range of motion and 5/5 strength in bilateral lower extremities. Negative straight leg raise test bilaterally. NEUROLOGICAL: Ambulates independently with acute left side pain.    No results found for any visits on 02/14/24.  Assessment & Plan     Problem List Items Addressed This Visit     BMI 34.0-34.9,adult    Obesity, class 1 (BMI 34-34.9) Insurance denied coverage for tirzepatide . Discussed potential for [employer] if insurance covers it. Animator regarding coverage for [facility]      Chronic pain syndrome (Chronic)   History of two back surgeries and a current bulging disc on the left side. Robaxin  500mg  is taken as  needed for muscle spasms. -Continue Robaxin  500mg  as needed for muscle spasms -continue voltaren  gel prn       Relevant Medications   traMADol  (ULTRAM ) 50 MG tablet   Essential hypertension   Essential hypertension Chronic  Blood pressure well controlled on current regimen. - Continue nifedipine  30 mg twice daily - Continue triamterene  37.5 mg and hydrochlorothiazide 25 mg daily      Relevant Medications   rosuvastatin  (CRESTOR ) 5 MG tablet   History of left breast cancer   Personal history of malignant neoplasm of left breast Breast cancer history. Follow-up with oncology and regular mammograms as scheduled. - Continue with mammogram as scheduled - Follow up with oncology      Hyperlipidemia    Mixed hyperlipidemia Chronic condition managed with medication. 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 Symptoms managed with gabapentin . - Continue gabapentin  100 mg twice daily      Primary insomnia    Primary insomnia Chronic insomnia managed with medication. - Continue Ambien  10 mg daily      Relevant Medications   zolpidem  (AMBIEN ) 10 MG tablet   Primary osteoarthritis of knees, bilateral   Bilateral primary osteoarthritis of knee Chronic knee pain managed with topical treatment. - Continue Voltaren  gel as needed      Relevant Medications   diclofenac  Sodium (VOLTAREN ) 1 % GEL   traMADol  (ULTRAM ) 50 MG tablet   Other Visit Diagnoses       Acute on chronic back pain    -  Primary   Relevant Medications   traMADol  (ULTRAM ) 50 MG tablet   ketorolac  (TORADOL ) injection 60 mg (Completed)     Infection of parotid gland       Relevant Medications   sulfamethoxazole -trimethoprim  (BACTRIM  DS) 800-160 MG tablet   clindamycin (CLEOCIN) 300  MG capsule     Swelling of left  parotid gland         Hypercalcemia       Relevant Orders   PTH, Intact and Calcium         Assessment & Plan Acute left parotid gland infection/inflammation Swelling and pain in the left parotid region extending down the left side of the neck into the left supraclavicular area. Biopsy was negative for cancer. Likely infection or inflammation. No redness or facial asymmetry on exam. - Prescribe Bactrim  800-160 mg twice daily for 7 days - Prescribe clindamycin 450 mg three times a day - Follow up with ENT specialist  Left lumbar radiculopathy with sciatica Chronic back pain with acute exacerbation on the left side, radiating down the left leg to the toes. Bulging disc and radiculopathy. Pain worsens with prolonged sitting and standing. Previous injections provided relief for a couple of months. - Prescribe tramadol  50-100 mg every 8 hours as needed - Prescribe prednisone  20 mg for 5 days - Administer Toradol  injection - Refer to neurosurgery for possible injections - Continue Robaxin  500 mg twice daily as needed    Chronic gastroesophageal reflux disease Chronic condition managed with medication as needed. - Continue omeprazole 20 mg daily as needed  Chronic allergic rhinitis Chronic condition managed with medication as needed. - Continue cetirizine 10 mg as needed for symptoms    Hypercalcemia Previous calcium  level elevated at 10.5. Possible relation to blood pressure medication. History of kidney stones. - Check parathyroid hormone (PTH) levels - Recheck calcium  levels  Prediabetes Last A1c was in the prediabetes range at 6.0. She declines repeat testing today.     Return in about 4 months (around 06/16/2024) for Chronic F/U.         Rockie Agent, MD  [facility] [phone] (phone) [phone] (fax)  [facility]

## 2024-02-14 NOTE — Assessment & Plan Note (Signed)
 Bilateral primary osteoarthritis of knee Chronic knee pain managed with topical treatment. - Continue Voltaren  gel as needed

## 2024-02-14 NOTE — Telephone Encounter (Signed)
 Patient said she doesn't need steroid but she did want something for yeast infection. She states she tends to get yeast infection when she takes antibiotic

## 2024-02-14 NOTE — Assessment & Plan Note (Signed)
  Primary insomnia Chronic insomnia managed with medication. - Continue Ambien  10 mg daily

## 2024-02-14 NOTE — Addendum Note (Signed)
 Addended by: SIMMONS-ROBINSON, Rylee Huestis L on: 02/14/2024 03:25 PM   Modules accepted: Orders

## 2024-02-14 NOTE — Telephone Encounter (Signed)
 Copied from CRM [id number]. Topic: Clinical - Medication Question >> Feb 14, 2024 11:47 AM Regina Deleon wrote: Reason for CRM: Pt was seen today at the office and wondering if PCP prescribed a Steroid for her gland diagnosis. She is awaiting for Prednisone 

## 2024-02-14 NOTE — Assessment & Plan Note (Signed)
  Obesity, class 1 (BMI 34-34.9) Insurance denied coverage for tirzepatide . Discussed potential for [facility] if insurance covers it. Animator regarding coverage for [employer]

## 2024-02-14 NOTE — Assessment & Plan Note (Signed)
 Personal history of malignant neoplasm of left breast Breast cancer history. Follow-up with oncology and regular mammograms as scheduled. - Continue with mammogram as scheduled - Follow up with oncology

## 2024-02-14 NOTE — Assessment & Plan Note (Signed)
 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 Symptoms managed with gabapentin . - Continue gabapentin  100 mg twice daily

## 2024-02-14 NOTE — Assessment & Plan Note (Signed)
  Mixed hyperlipidemia Chronic condition managed with medication. - Continue Crestor  2.5 mg daily

## 2024-02-14 NOTE — Telephone Encounter (Signed)
 Diflucan sent

## 2024-02-14 NOTE — Patient Instructions (Signed)
                         Contains text generated by [employer]bridge.                                 Contains text generated by Abridge.     To keep you healthy, please keep in mind the following health maintenance items that you are due for:   Health Maintenance Due  Topic Date Due   DTaP/Tdap/Td (1 - Tdap) Never done   Pneumococcal Vaccine: 50+ Years (2 of 2 - PCV) 06/16/2017   Influenza Vaccine  12/03/2023   Mammogram  02/22/2024     Best Wishes,   Dr. Lang

## 2024-02-14 NOTE — Assessment & Plan Note (Signed)
 Essential hypertension Chronic  Blood pressure well controlled on current regimen. - Continue nifedipine  30 mg twice daily - Continue triamterene  37.5 mg and hydrochlorothiazide 25 mg daily

## 2024-02-14 NOTE — Assessment & Plan Note (Signed)
 History of two back surgeries and a current bulging disc on the left side. Robaxin  500mg  is taken as needed for muscle spasms. -Continue Robaxin  500mg  as needed for muscle spasms -continue voltaren  gel prn

## 2024-02-16 ENCOUNTER — Ambulatory Visit: Payer: Self-pay | Admitting: Family Medicine

## 2024-02-16 LAB — PTH, INTACT AND CALCIUM
Calcium: 10.3 mg/dL (ref 8.7–10.3)
PTH: 35 pg/mL (ref 15–65)

## 2024-02-21 ENCOUNTER — Ambulatory Visit: Payer: Self-pay

## 2024-02-21 NOTE — Telephone Encounter (Signed)
 Noted

## 2024-02-21 NOTE — Telephone Encounter (Signed)
 Patient states that she already spoke with someone at the office about this and her husband has actually already picked up the medication diflucan ---she has it on hand now and is advised to call us  back if anything changes.     FYI Only or Action Required?: FYI only for provider.  Patient was last seen in primary care on 02/14/2024 by Sharma Coyer, MD.  Called Nurse Triage reporting Vaginitis.  Triage Disposition: Home Care  Patient/caregiver understands and will follow disposition?: Yes         Message from Zy'onna H sent at 02/21/2024  9:09 AM EDT  Reason for Triage: Patient stated she was previously on Antibiotics prescribed by PCP. After completion, she stated she has symptoms of a Vaginal Yeast Infection.  Patient asked an order be placed for the following Rx: Fluconazole  - Diflucan          Reason for Disposition  [1] Symptoms of a yeast infection (i.e., itchy, white discharge, not bad smelling) AND [2] feels like prior vaginal yeast infections  Answer Assessment - Initial Assessment Questions Patient states she just finished her antibiotics yesterday and she could tell that she was starting to feel uncomfortable in her vaginal area--felt like previous yeast infections. She states she already spoke to someone at the office about this and the medication was sent to the pharmacy Her husband just picked up the medication and patient had it in her hand at this time---fluconazole (diflucan )  Patient is advised to call us  back if anything changes or with any further questions/concerns. Patient is advised that if anything worsens to go to the Emergency Room. Patient verbalized understanding.  Protocols used: Vaginal Symptoms-A-AH

## 2024-02-22 ENCOUNTER — Ambulatory Visit

## 2024-02-23 ENCOUNTER — Ambulatory Visit
Admission: RE | Admit: 2024-02-23 | Discharge: 2024-02-23 | Disposition: A | Discharge status: Home / Self Care | Source: Ambulatory Visit | Attending: Hematology and Oncology | Admitting: Hematology and Oncology

## 2024-02-23 ENCOUNTER — Ambulatory Visit

## 2024-02-23 DIAGNOSIS — Z1231 Encounter for screening mammogram for malignant neoplasm of breast: Secondary | ICD-10-CM

## 2024-02-24 ENCOUNTER — Other Ambulatory Visit: Payer: Self-pay | Admitting: Family Medicine

## 2024-02-24 DIAGNOSIS — I10 Essential (primary) hypertension: Secondary | ICD-10-CM

## 2024-03-01 NOTE — Progress Notes (Signed)
 Regina Deleon                                          MRN: [id number]   03/01/2024   The VBCI Quality Team Specialist reviewed this patient medical record for the purposes of chart review for care gap closure. The following were reviewed: abstraction for care gap closure-controlling blood pressure.    VBCI Quality Team

## 2024-04-08 ENCOUNTER — Other Ambulatory Visit: Payer: Self-pay | Admitting: Family Medicine

## 2024-04-08 DIAGNOSIS — M5417 Radiculopathy, lumbosacral region: Secondary | ICD-10-CM

## 2024-05-11 ENCOUNTER — Ambulatory Visit: Payer: Self-pay | Admitting: Family Medicine

## 2024-05-11 NOTE — Telephone Encounter (Signed)
 FYI Only or Action Required?: Action required by provider: clinical question for provider.  Patient was last seen in primary care on 02/14/2024 by Sharma Coyer, MD.  Called Nurse Triage reporting New Med Request and Covid Positive.  Symptoms began yesterday.  Interventions attempted: Nothing.  Symptoms are: stable.  Triage Disposition: Call PCP Now  Patient/caregiver understands and will follow disposition?: Yes          Copied from CRM #[id number]. Topic: Clinical - Medical Advice >> May 11, 2024  1:11 PM Regina Deleon wrote: Reason for CRM:  Yesterday, Regina Deleon' husband test positive for COVID. Regina Deleon wants to know if her doctor would call her in  Paxlovid  to see if this will help her not get it.   Reason for Disposition  [1] Caller requests to speak ONLY to PCP AND [2] URGENT question  Answer Assessment - Initial Assessment Questions 1. REASON FOR CALL or QUESTION: What is your reason for calling today? or How can I best    This RN called patient back to follow up to verify if she took the rapid at home Covid test per our earlier conversation. Patient stated the test was in fact positive. Her husband tested positive for Covid yesterday with an at home test as well. Her only symptom is a scratchy throat. She is requesting the provider prescribe Paxlovid . Please advise.  Protocols used: PCP Call - No Triage-A-AH

## 2024-05-11 NOTE — Telephone Encounter (Signed)
 Patient called into triage to report that her husband tested positive yesterday for Covid.  She is requesting Paxlovid , her only symptom is a scratchy throat. When asked if she has tested she said no. Patient stated she will take the at home test, and call back with results. Will place in callback folder.          Copied from CRM #[id number]. Topic: Clinical - Medical Advice >> May 11, 2024  1:11 PM Montie POUR wrote: Reason for CRM:  Yesterday, [name]' husband test positive for COVID. [name] wants to know if her doctor would call her in  Paxlovid  to see if this will help her not get it.  Please call Ms. Regina Deleon at [phone] or [phone] to discuss. >> May 11, 2024  1:15 PM Montie POUR wrote: Please send to [facility] in [place]

## 2024-05-12 ENCOUNTER — Other Ambulatory Visit: Payer: Self-pay | Admitting: Family Medicine

## 2024-05-12 ENCOUNTER — Telehealth: Payer: Self-pay | Admitting: Family Medicine

## 2024-05-12 DIAGNOSIS — U071 COVID-19: Secondary | ICD-10-CM

## 2024-05-12 MED ORDER — NIRMATRELVIR/RITONAVIR (PAXLOVID)TABLET: 3.0000 | ORAL_TABLET | Freq: Two times a day (BID) | ORAL | 0 refills | Status: AC

## 2024-05-12 NOTE — Telephone Encounter (Signed)
 Paxlovid  sent to pharmacy for 5 day course

## 2024-05-12 NOTE — Progress Notes (Signed)
Paxlovid sent to patient's pharmacy.

## 2024-05-12 NOTE — Telephone Encounter (Signed)
 PT Telephone encounter reviewed.

## 2024-05-12 NOTE — Telephone Encounter (Unsigned)
 Copied from CRM #[id number]. Topic: Clinical - Refused Triage >> May 12, 2024  8:14 AM Lonell PEDLAR wrote: Patient/caller voiced complaints of Shortness of breath. Declined transfer to triage. States that she knows her symptoms will improve with rx that was sent to the pharmacy.

## 2024-05-24 ENCOUNTER — Encounter: Payer: Self-pay | Admitting: Family Medicine

## 2024-05-24 ENCOUNTER — Ambulatory Visit: Payer: Self-pay

## 2024-05-24 ENCOUNTER — Ambulatory Visit: Admitting: Family Medicine

## 2024-05-24 VITALS — BP 105/47 | HR 74 | Resp 16 | Ht 63.0 in | Wt 193.0 lb

## 2024-05-24 DIAGNOSIS — G8929 Other chronic pain: Secondary | ICD-10-CM

## 2024-05-24 DIAGNOSIS — M549 Dorsalgia, unspecified: Secondary | ICD-10-CM

## 2024-05-24 DIAGNOSIS — U071 COVID-19: Secondary | ICD-10-CM | POA: Diagnosis not present

## 2024-05-24 MED ORDER — KETOROLAC TROMETHAMINE 60 MG/2ML IM SOLN
60.0000 mg | Freq: Once | INTRAMUSCULAR | Status: AC
  Administered 2024-05-24: 60 mg via INTRAMUSCULAR

## 2024-05-24 NOTE — Progress Notes (Signed)
 "     Established patient visit   Patient: Regina Deleon   DOB: [birth year]   74 y.o. Female  MRN: [id number] Visit Date: 05/24/2024  Today's healthcare provider: Nancyann Perry, MD   Chief Complaint  Patient presents with   Acute Visit    F/u weakness, body aches, and rattling ( congestion) Pt wants tordial shot for pain    Subjective    Discussed the use of AI scribe software for clinical note transcription with the patient, who gave verbal consent to proceed.  History of Present Illness   Regina Deleon is a 74 year old female who presents with persistent symptoms following a recent COVID-19 infection.  She contracted COVID-19 and began treatment with Paxlovid  on May 12, 2024, completing a five-day course of 400 mg twice daily. Despite completing the medication, she continues to feel unwell. Initial symptoms included malaise, a slight cough, and fever. The cough has resolved, and the fever has subsided, although she experienced a hot flash earlier today. She has persistent 'noise' in her chest, particularly on the left side, associated with a sensation of shortness of breath, but not pain.  She has experienced a loss of taste and smell, which she attributes to the COVID-19 infection. Her body still feels painful, with aches and pains persisting throughout.  She has a history of back pain due to a bulging disc, which has worsened during her illness. She has had two previous back surgeries for a ruptured disc and a bone spur. She has been using Tylenol  for pain relief, although she finds it ineffective. She has also been prescribed cyclobenzaprine  as a muscle relaxer. Toradol  and Tramadol  have been effective for her back pain in the past.       Medications: Show/hide medication list[1] Review of Systems  Constitutional:  Negative for appetite change, chills, fatigue and fever.  Respiratory:  Negative for chest tightness and shortness of breath.   Cardiovascular:  Negative  for chest pain and palpitations.  Gastrointestinal:  Negative for abdominal pain, nausea and vomiting.  Neurological:  Negative for dizziness and weakness.     Objective    BP (!) 105/47 (BP Location: Left Arm, Patient Position: Sitting, Cuff Size: Large)   Pulse 74   Resp 16   Ht 5' 3 (1.6 m)   Wt 193 lb (87.5 kg)   LMP 06/02/1984   SpO2 99%   BMI 34.19 kg/m   Physical Exam   General: Appearance:    Obese female in no acute distress  Eyes:    PERRL, conjunctiva/corneas clear, EOM's intact       Lungs:     Clear to auscultation bilaterally, respirations unlabored  Heart:    Normal heart rate. Normal rhythm. No murmurs, rubs, or gallops.    MS:   All extremities are intact.    Neurologic:   Awake, alert, oriented x 3. No apparent focal neurological defect.        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 History of bulging disc and previous back surgeries. Tylenol  ineffective. Cyclobenzaprine  and Toradol  previously effective. - Administered Toradol  injection for pain relief.  COVID-19 infection Recent COVID-19 infection treated with Paxlovid . Symptoms resolved except for persistent chest noise due to bronchial inflammation. Anosmia noted, expected to resolve. - Monitor symptoms, expect improvement in aches and pains within a week.  Nancyann Perry, MD  [facility] [phone] (phone) [phone] (fax)  [facility]     [1]  Outpatient Medications Prior to Visit  Medication Sig   acetaminophen  (TYLENOL ) 500 MG tablet Take 1,000 mg by mouth every 6 (six) hours as needed for mild pain.    cetirizine (ZYRTEC) 10 MG tablet Take 10 mg by mouth daily as needed for allergies.   clindamycin  (CLEOCIN ) 300 MG capsule Take 1 capsule (300 mg total) by mouth 3 (three) times daily.   diclofenac  Sodium (VOLTAREN ) 1 % GEL  Apply 4 g topically 4 (four) times daily as needed.   docusate sodium (COLACE) 100 MG capsule Take 200 mg by mouth daily.   dorzolamide-timolol  (COSOPT) 22.3-6.8 MG/ML ophthalmic solution Place 1 drop into both eyes 2 (two) times daily.   fluconazole  (DIFLUCAN ) 150 MG tablet Take one 150 mg tablet by mouth at onset of symptoms and repeat after 72 hours if symptoms persist   fluticasone  (FLONASE ) 50 MCG/ACT nasal spray SPRAY 2 SPRAYS INTO EACH NOSTRIL EVERY DAY   gabapentin  (NEURONTIN ) 100 MG capsule TAKE 1 CAPSULE BY MOUTH TWICE A DAY   latanoprost (XALATAN) 0.005 % ophthalmic solution Place 1 drop into both eyes at bedtime.   methocarbamol  (ROBAXIN ) 500 MG tablet Take 1 tablet (500 mg total) by mouth 2 (two) times daily as needed for muscle spasms.   NIFEdipine  (PROCARDIA -XL/NIFEDICAL-XL) 30 MG 24 hr tablet TAKE 1 TABLET BY MOUTH 2 TIMES DAILY.   Omega 3 1000 MG CAPS Take 1 capsule (1,000 mg total) by mouth daily.   omeprazole (PRILOSEC OTC) 20 MG tablet Take 20 mg by mouth daily as needed (acid reflux).   ondansetron  (ZOFRAN ) 4 MG tablet Take 1 tablet (4 mg total) by mouth every 8 (eight) hours as needed for nausea or vomiting.   rosuvastatin  (CRESTOR ) 5 MG tablet Take 0.5 tablets (2.5 mg total) by mouth daily.   tirzepatide  (ZEPBOUND ) 2.5 MG/0.5ML Pen INJECT 2.5 MG SUBCUTANEOUSLY WEEKLY   traMADol  (ULTRAM ) 50 MG tablet Take 1-2 tablets (50-100 mg total) by mouth every 8 (eight) hours as needed.   triamterene -hydrochlorothiazide (MAXZIDE-25) 37.5-25 MG tablet TAKE 1 TABLET BY MOUTH EVERY DAY   zolpidem  (AMBIEN ) 10 MG tablet Take 1 tablet (10 mg total) by mouth at bedtime as needed. for sleep   No facility-administered medications prior to visit.   "

## 2024-05-24 NOTE — Telephone Encounter (Signed)
 Finished Paxlovid  8 days ago, now reports a dry rattling to the L side of her chest, denies pain/SOB, says it isn't wheezing. Appt scheduled for 10am today.  FYI Only or Action Required?: FYI only for provider: appointment scheduled on 1/21.  Patient was last seen in primary care on 02/14/2024 by Sharma Coyer, MD.  Called Nurse Triage reporting Rattling in chest and Loss of smell.  Symptoms began today.  Interventions attempted: Nothing.  Symptoms are: stable.  Triage Disposition: No disposition on file.  Patient/caregiver understands and will follow disposition?: Yes   Pt finish medication for covid 8 days ago but can hear a rattling sound in her chest, no pain or discomfort just the sound  Answer Assessment - Initial Assessment Questions 1. COVID-19 ONSET: When did the symptoms of COVID-19 first start?     2 weeks ago, finished Paxlovid  8 days ago   3. MAIN SYMPTOM:  What is your main concern or symptom right now? (e.g., breathing difficulty, cough, fatigue. loss of smell)     Dry rattling from the left side of her chest, denies wheezing, denies pain, denies SOB  6. BREATHING DIFFICULTY: Are you having any trouble breathing? If Yes, ask: How bad is your breathing? (e.g., mild, moderate, severe)      Denies   8. OTHER SYMPTOMS: Do you have any other symptoms?  (e.g., cough, fatigue, fever, headache, muscle pain, shortness of breath, weakness)     Body aches, unsteady gait (I can walk around, I just feel weak), not eating much  Protocols used: COVID-19 - Persisting Symptoms Follow-up Call-A-[facility]

## 2024-06-19 ENCOUNTER — Ambulatory Visit: Admitting: Family Medicine
